# Patient Record
Sex: Female | Born: 1940
Health system: Southern US, Community
[De-identification: ages and names within clinical notes are randomized; demographics above are authoritative.]

## PROBLEM LIST (undated history)

## (undated) DIAGNOSIS — M479 Spondylosis, unspecified: Secondary | ICD-10-CM

## (undated) DIAGNOSIS — K219 Gastro-esophageal reflux disease without esophagitis: Secondary | ICD-10-CM

## (undated) DIAGNOSIS — I739 Peripheral vascular disease, unspecified: Secondary | ICD-10-CM

## (undated) DIAGNOSIS — K921 Melena: Secondary | ICD-10-CM

## (undated) DIAGNOSIS — Z972 Presence of dental prosthetic device (complete) (partial): Secondary | ICD-10-CM

## (undated) DIAGNOSIS — D649 Anemia, unspecified: Secondary | ICD-10-CM

## (undated) DIAGNOSIS — K5790 Diverticulosis of intestine, part unspecified, without perforation or abscess without bleeding: Secondary | ICD-10-CM

## (undated) DIAGNOSIS — F32A Depression, unspecified: Secondary | ICD-10-CM

## (undated) DIAGNOSIS — I10 Essential (primary) hypertension: Secondary | ICD-10-CM

## (undated) DIAGNOSIS — E039 Hypothyroidism, unspecified: Secondary | ICD-10-CM

## (undated) DIAGNOSIS — F419 Anxiety disorder, unspecified: Secondary | ICD-10-CM

## (undated) DIAGNOSIS — F329 Major depressive disorder, single episode, unspecified: Secondary | ICD-10-CM

## (undated) DIAGNOSIS — M1990 Unspecified osteoarthritis, unspecified site: Secondary | ICD-10-CM

## (undated) HISTORY — PX: ABDOMINAL HYSTERECTOMY: SHX81

## (undated) HISTORY — DX: Hypothyroidism, unspecified: E03.9

## (undated) HISTORY — DX: Depression, unspecified: F32.A

## (undated) HISTORY — DX: Essential (primary) hypertension: I10

## (undated) HISTORY — PX: FOOT SURGERY: SHX648

## (undated) HISTORY — PX: WRIST SURGERY: SHX841

## (undated) HISTORY — DX: Major depressive disorder, single episode, unspecified: F32.9

---

## 1993-12-03 HISTORY — PX: CARDIAC CATHETERIZATION: SHX172

## 2006-11-06 ENCOUNTER — Ambulatory Visit: Payer: Self-pay | Admitting: Family Medicine

## 2008-05-05 ENCOUNTER — Ambulatory Visit: Payer: Self-pay | Admitting: Internal Medicine

## 2008-06-08 ENCOUNTER — Ambulatory Visit: Payer: Self-pay | Admitting: Otolaryngology

## 2008-11-30 ENCOUNTER — Ambulatory Visit: Payer: Self-pay | Admitting: Internal Medicine

## 2008-12-29 ENCOUNTER — Ambulatory Visit: Payer: Self-pay | Admitting: Gastroenterology

## 2008-12-29 HISTORY — PX: COLONOSCOPY: SHX174

## 2009-06-23 ENCOUNTER — Ambulatory Visit: Payer: Self-pay | Admitting: Internal Medicine

## 2010-08-06 IMAGING — US US EXTREM LOW VENOUS*L*
1 series · 17 of 23 positions shown · non-contrast
Comparison: none

REASON FOR EXAM: pa[REDACTED]reased popliteal pulses left leg
COMMENTS:

[Series 1: us extrem low venous*left* · 17 of 23 slices shown]
[im 1/23]
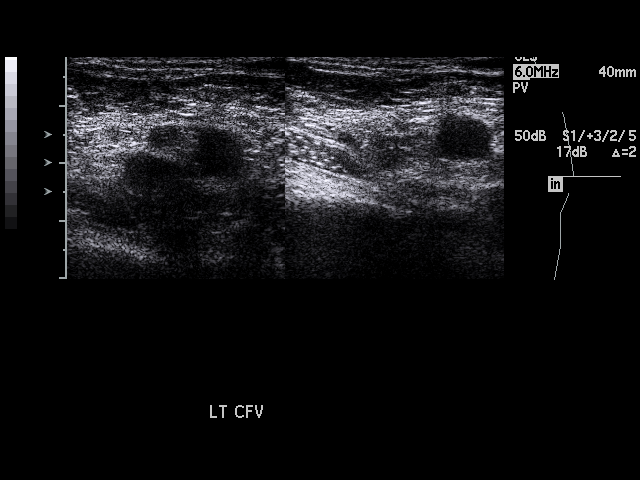
[im 3/23]
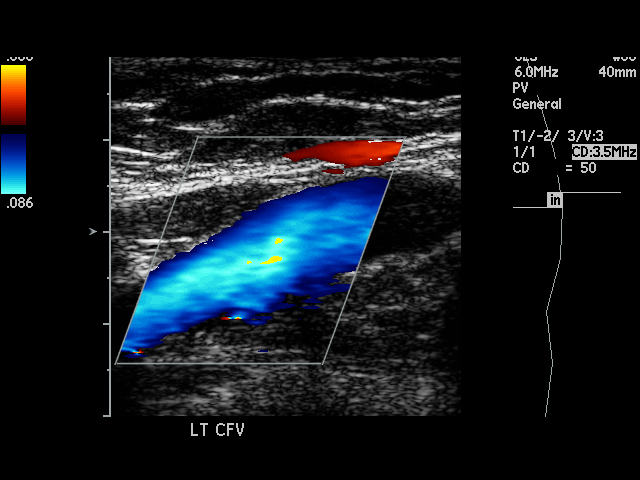
[im 4/23]
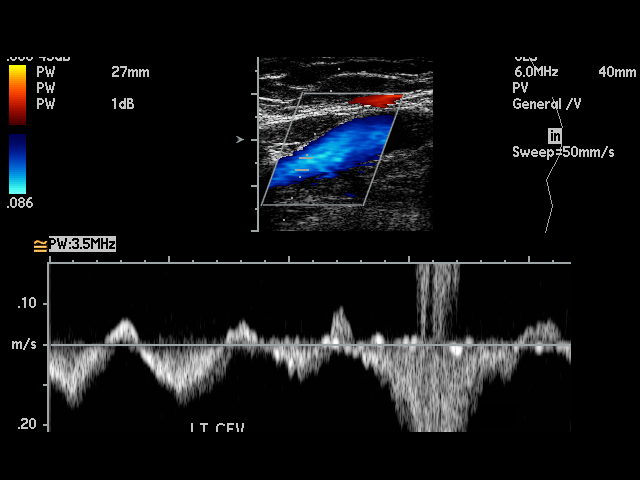
[im 5/23]
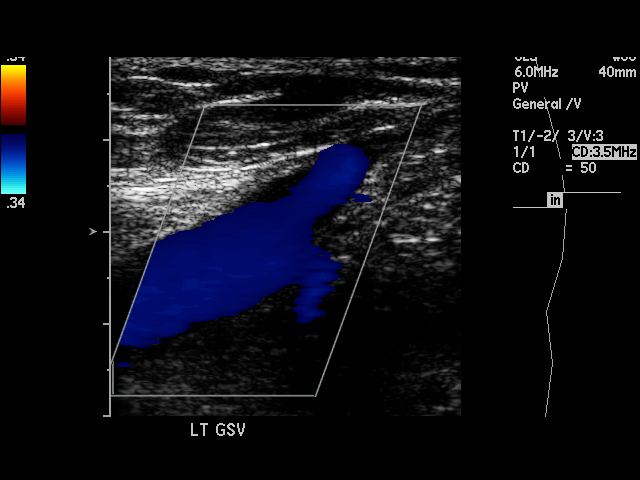
[im 7/23]
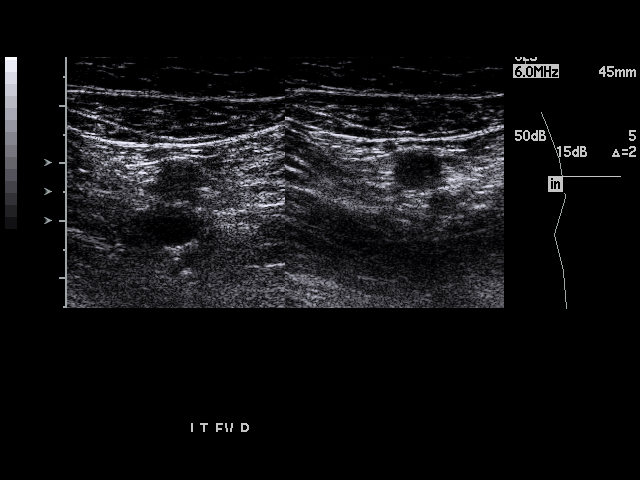
[im 8/23]
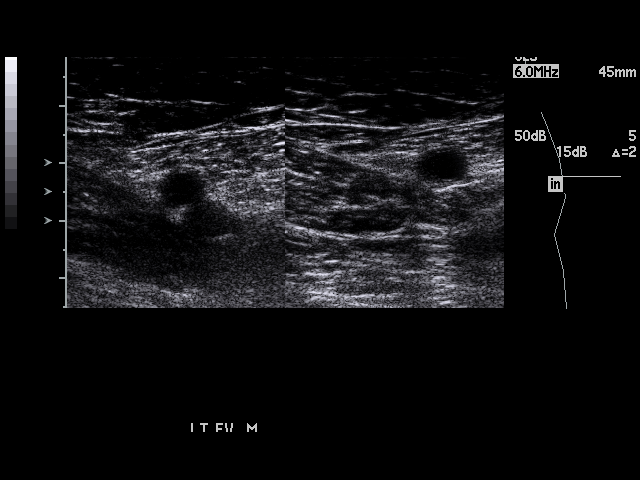
[im 9/23]
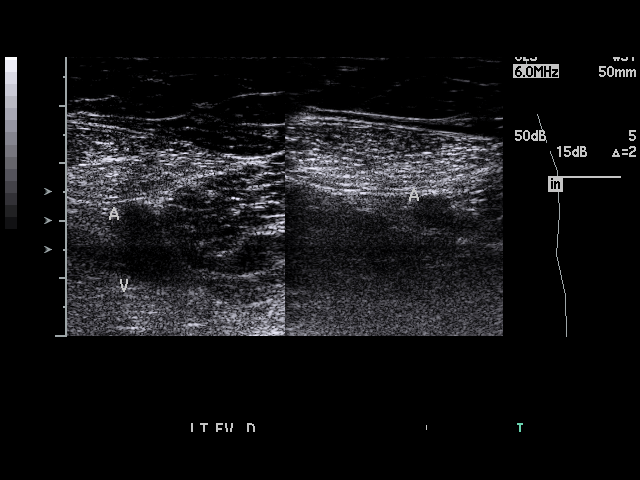
[im 11/23]
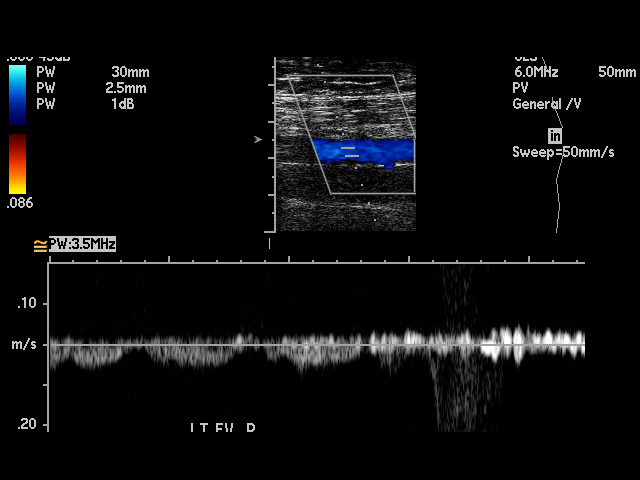
[im 12/23]
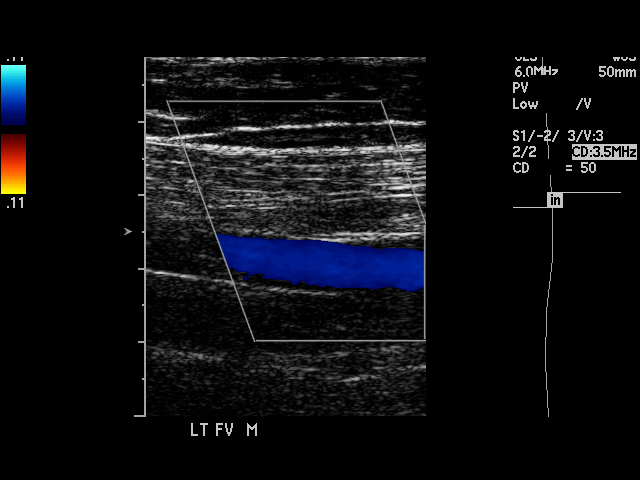
[im 13/23]
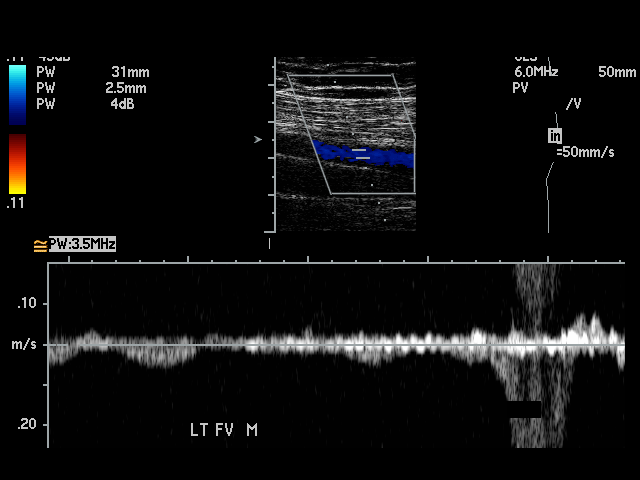
[im 15/23]
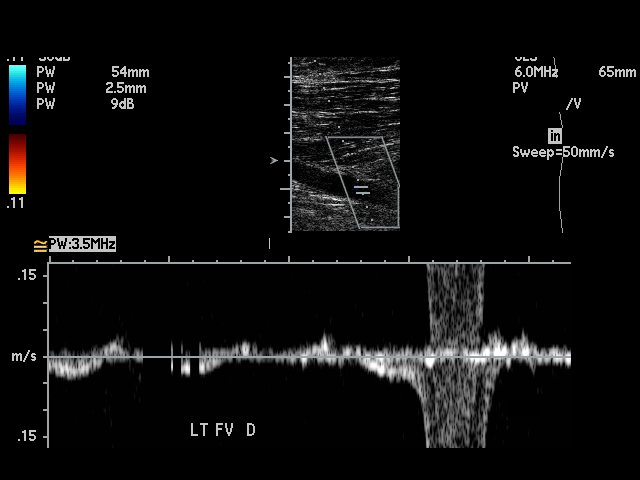
[im 16/23]
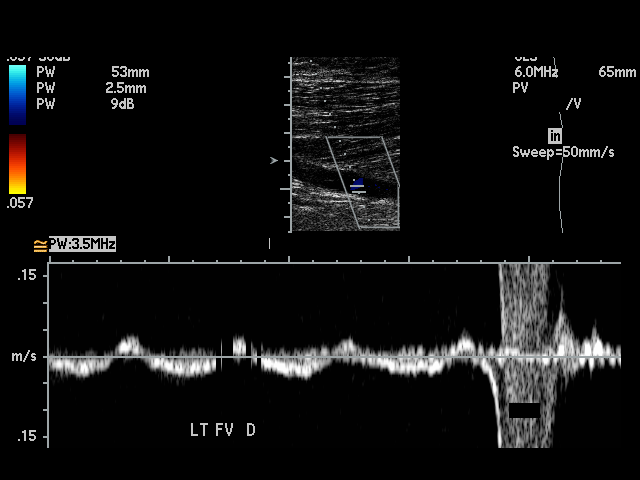
[im 17/23]
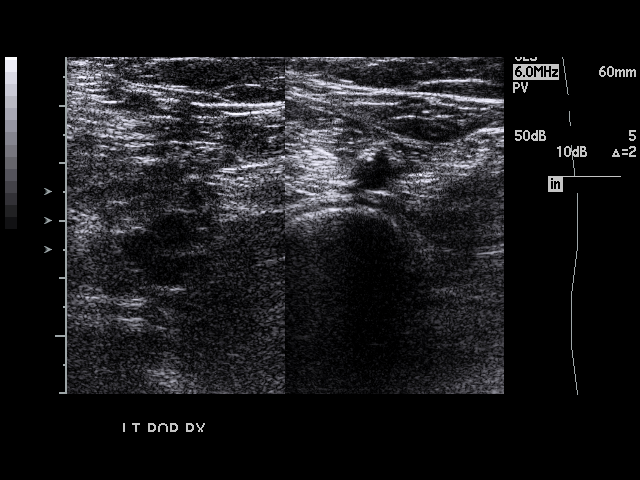
[im 19/23]
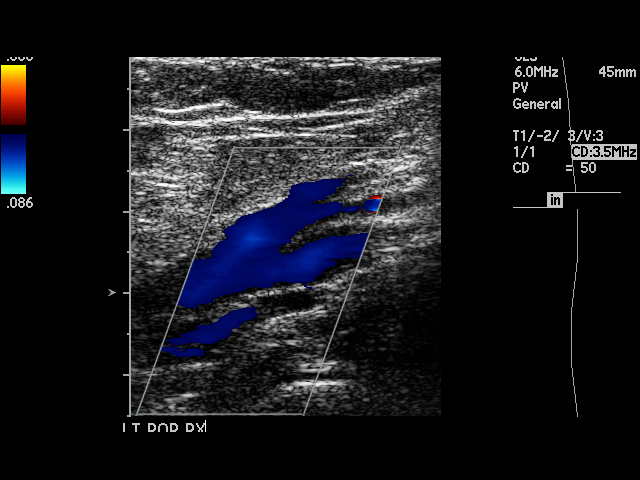
[im 20/23]
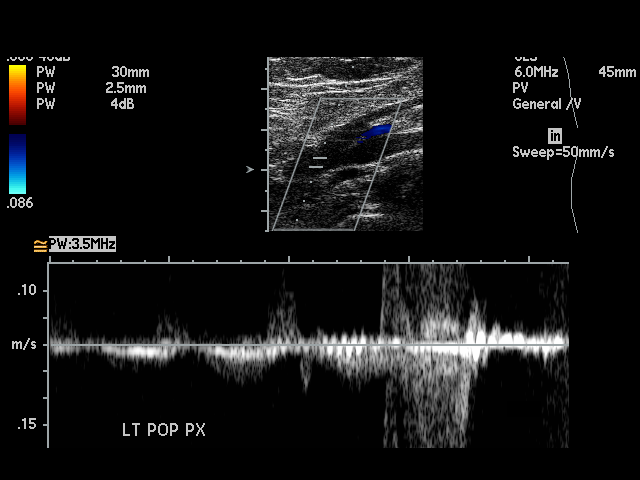
[im 21/23]
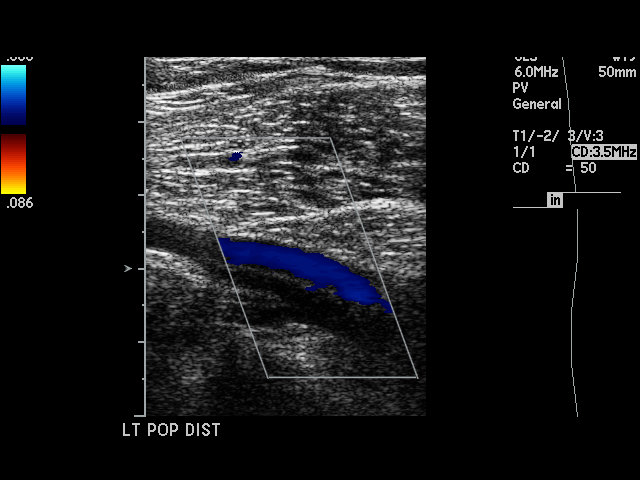
[im 23/23]
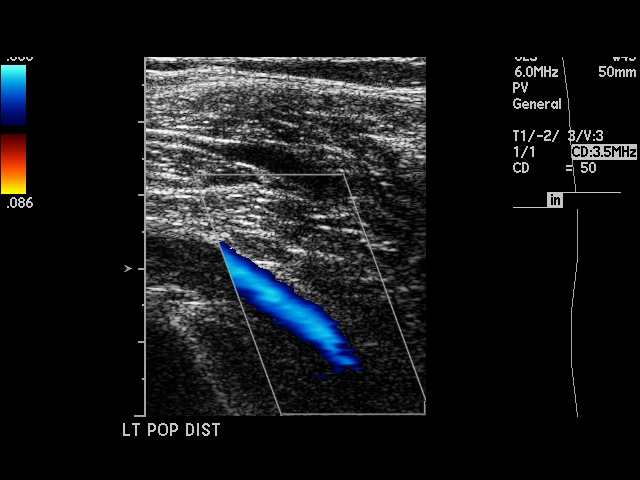

[17 of 23 positions shown; findings below may reference images not displayed]

PROCEDURE:     US  - US DOPPLER LOW EXTR LEFT  - November 30, 2008 [DATE]

RESULT:     The augmentation flow waveforms are normal. The LEFT femoral and
popliteal vein shows complete compressibility throughout its course. Doppler
examination shows no occlusion or evidence of the deep vein thrombosis.
IMPRESSION: No deep vein thrombosis is identified in the LEFT leg.

## 2010-11-13 LAB — HM MAMMOGRAPHY: HM Mammogram: NORMAL

## 2011-01-23 ENCOUNTER — Ambulatory Visit: Payer: Self-pay | Admitting: Internal Medicine

## 2011-02-02 ENCOUNTER — Ambulatory Visit: Payer: Self-pay | Admitting: General Surgery

## 2011-02-08 ENCOUNTER — Ambulatory Visit: Payer: Self-pay | Admitting: General Surgery

## 2011-02-13 ENCOUNTER — Ambulatory Visit: Payer: Self-pay | Admitting: General Surgery

## 2011-03-04 ENCOUNTER — Ambulatory Visit: Payer: Self-pay | Admitting: General Surgery

## 2011-04-03 ENCOUNTER — Ambulatory Visit: Payer: Self-pay | Admitting: General Surgery

## 2011-08-15 ENCOUNTER — Ambulatory Visit (INDEPENDENT_AMBULATORY_CARE_PROVIDER_SITE_OTHER): Payer: Medicare Other | Admitting: Internal Medicine

## 2011-08-15 ENCOUNTER — Encounter: Payer: Self-pay | Admitting: Internal Medicine

## 2011-08-15 VITALS — BP 145/86 | HR 60 | Temp 98.3°F | Resp 12 | Ht 65.0 in | Wt 164.5 lb

## 2011-08-15 DIAGNOSIS — E039 Hypothyroidism, unspecified: Secondary | ICD-10-CM | POA: Insufficient documentation

## 2011-08-15 DIAGNOSIS — F32A Depression, unspecified: Secondary | ICD-10-CM | POA: Insufficient documentation

## 2011-08-15 DIAGNOSIS — F329 Major depressive disorder, single episode, unspecified: Secondary | ICD-10-CM | POA: Insufficient documentation

## 2011-08-15 DIAGNOSIS — I10 Essential (primary) hypertension: Secondary | ICD-10-CM | POA: Insufficient documentation

## 2011-08-15 DIAGNOSIS — Z0000 Encounter for general adult medical examination without abnormal findings: Secondary | ICD-10-CM

## 2011-08-15 DIAGNOSIS — F3289 Other specified depressive episodes: Secondary | ICD-10-CM

## 2011-08-15 LAB — COMPREHENSIVE METABOLIC PANEL
Alkaline Phosphatase: 52 U/L (ref 39–117)
CO2: 28 mEq/L (ref 19–32)
Creatinine, Ser: 0.8 mg/dL (ref 0.4–1.2)
GFR: 97.24 mL/min (ref >60.00)
Glucose, Bld: 117 mg/dL — ABNORMAL HIGH (ref 70–99)
Sodium: 142 mEq/L (ref 135–145)
Total Bilirubin: 0.4 mg/dL (ref 0.3–1.2)

## 2011-08-15 LAB — CBC WITH DIFFERENTIAL/PLATELET
Basophils Relative: 0.6 % (ref 0.0–3.0)
Eosinophils Relative: 0.9 % (ref 0.0–5.0)
HCT: 43.4 % (ref 36.0–46.0)
Hemoglobin: 14.6 g/dL (ref 12.0–15.0)
Lymphs Abs: 2.2 10*3/uL (ref 0.7–4.0)
MCV: 90.3 fl (ref 78.0–100.0)
Monocytes Absolute: 0.5 10*3/uL (ref 0.1–1.0)
Monocytes Relative: 6.7 % (ref 3.0–12.0)
Neutro Abs: 4.6 10*3/uL (ref 1.4–7.7)
RBC: 4.81 Mil/uL (ref 3.87–5.11)
WBC: 7.4 10*3/uL (ref 4.5–10.5)

## 2011-08-15 LAB — LIPID PANEL
Cholesterol: 232 mg/dL — ABNORMAL HIGH (ref 0–200)
HDL: 84.6 mg/dL (ref >39.00)
Triglycerides: 117 mg/dL (ref 0.0–149.0)

## 2011-08-15 MED ORDER — ZOSTER VACCINE LIVE 19400 UNT/0.65ML ~~LOC~~ SOLR: 0.6500 mL | Freq: Once | SUBCUTANEOUS | Status: DC

## 2011-08-15 NOTE — Patient Instructions (Signed)
Labs today, return in 6 months. Call if blood pressure>140/90 at home.

## 2011-08-15 NOTE — Progress Notes (Signed)
Subjective:    Patient ID: Lindsay [name], female    DOB: [birth year], 70 y.o.   MRN: [id number]  HPI Ms. bleeding and is a 70 year old female who presents to followup hypothyroidism, hypertension, and depression. She reports that she has been doing well in the interim since her last visit. She denies any complaints today. She continues to care for her husband who has dementia. She receives resp[facility] relatives [facility]. She continues to work full-time.  Outpatient Encounter Prescriptions as of 08/15/2011  Medication Sig Dispense Refill  . bisoprolol-hydrochlorothiazide (ZIAC) 10-6.25 MG per tablet       . levothyroxine (SYNTHROID, LEVOTHROID) 50 MCG tablet       . meloxicam (MOBIC) 7.5 MG tablet       . PARoxetine (PAXIL) 20 MG tablet       . traZODone (DESYREL) 50 MG tablet       . zoster vaccine live, PF, (ZOSTAVAX) 40981 UNT/0.65ML injection Inject 19,400 Units into the skin once.  1 vial  0    Review of Systems  Constitutional: Negative for fever, chills, appetite change, fatigue and unexpected weight change.  HENT: Negative for ear pain, congestion, sore throat, trouble swallowing, neck pain, voice change and sinus pressure.   Eyes: Negative for visual disturbance.  Respiratory: Negative for cough, shortness of breath, wheezing and stridor.   Cardiovascular: Negative for chest pain, palpitations and leg swelling.  Gastrointestinal: Negative for nausea, vomiting, abdominal pain, diarrhea, constipation, blood in stool, abdominal distention and anal bleeding.  Genitourinary: Negative for dysuria and flank pain.  Musculoskeletal: Negative for myalgias, arthralgias and gait problem.  Skin: Negative for color change and rash.  Neurological: Negative for dizziness and headaches.  Hematological: Negative for adenopathy. Does not bruise/bleed easily.  Psychiatric/Behavioral: Positive for dysphoric mood (occasional associated with caring for  husband with dementia, controlled with paxil). Negative for suicidal ideas and sleep disturbance. The patient is not nervous/anxious.     BP 145/86  Pulse 60  Temp(Src) 98.3 F (36.8 C) (Oral)  Resp 12  Ht 5\' 5"  (1.651 m)  Wt 164 lb 8 oz (74.617 kg)  BMI 27.37 kg/m2  SpO2 97%     Objective:   Physical Exam  Constitutional: She is oriented to person, place, and time. She appears well-developed and well-nourished. No distress.  HENT:  Head: Normocephalic and atraumatic.  Right Ear: External ear normal.  Left Ear: External ear normal.  Nose: Nose normal.  Mouth/Throat: Oropharynx is clear and moist. No oropharyngeal exudate.  Eyes: Conjunctivae are normal. Pupils are equal, round, and reactive to light. Right eye exhibits no discharge. Left eye exhibits no discharge. No scleral icterus.  Neck: Normal range of motion. Neck supple. No tracheal deviation present. No thyromegaly present.  Cardiovascular: Normal rate, regular rhythm, normal heart sounds and intact distal pulses.  Exam reveals no gallop and no friction rub.   No murmur heard. Pulmonary/Chest: Effort normal and breath sounds normal. No respiratory distress. She has no wheezes. She has no rales. She exhibits no tenderness.  Abdominal: Soft. Bowel sounds are normal. She exhibits no distension and no mass. There is no tenderness. There is no rebound and no guarding.  Musculoskeletal: Normal range of motion. She exhibits no edema and no tenderness.  Lymphadenopathy:    She has no cervical adenopathy.  Neurological: She is alert and oriented to person, place, and time. No cranial nerve deficit. She exhibits normal muscle tone. Coordination normal.  Skin: Skin is warm and dry. No rash noted. She is not diaphoretic. No erythema. No pallor.  Psychiatric: She has a normal mood and affect. Her behavior is normal. Judgment and thought content normal.          Assessment & Plan:  1. Hypothyroidism - will check TSH with labs  today. Will continue levothyroxine. Patient will return to clinic in 6 months.  2. Hypertension - blood pressure is elevated today. Patient has not been checking blood pressure at home. Encouraged her to check blood pressure at home and call if blood pressure running consistently greater than 140/90. Will check renal function and electrolytes with labs today. Patient will followup in 6 months.  3. Depression - depression currently well controlled on Paxil. Patient is managing well caring for her husband with dementia. Encouraged her to continue to use family members and [facility] for [facility].  4. Health maintenance - patient is up-to-date on health maintenance. She will return for her physical exam in May of 2013. We discussed Zostavax vaccine today. She was given a prescription to receive this vaccine at her local pharmacy. She was also encouraged to get her flu shot this fall either here at our office or at her pharmacy.

## 2011-08-16 ENCOUNTER — Encounter: Payer: Self-pay | Admitting: Internal Medicine

## 2011-08-16 LAB — LDL CHOLESTEROL, DIRECT: Direct LDL: 140.2 mg/dL

## 2011-08-16 LAB — MICROALBUMIN / CREATININE URINE RATIO
Creatinine,U: 134.9 mg/dL
Microalb Creat Ratio: 0.3 mg/g (ref 0.0–30.0)

## 2011-09-10 ENCOUNTER — Ambulatory Visit: Payer: Self-pay | Admitting: General Surgery

## 2011-09-27 ENCOUNTER — Ambulatory Visit: Payer: Self-pay | Admitting: General Surgery

## 2011-10-04 ENCOUNTER — Ambulatory Visit: Payer: Self-pay | Admitting: General Surgery

## 2011-11-30 ENCOUNTER — Other Ambulatory Visit: Payer: Self-pay | Admitting: *Deleted

## 2011-11-30 MED ORDER — MELOXICAM 7.5 MG PO TABS: 7.5000 mg | ORAL_TABLET | Freq: Every day | ORAL | Status: DC

## 2011-11-30 MED ORDER — LEVOTHYROXINE SODIUM 50 MCG PO TABS: 50.0000 ug | ORAL_TABLET | Freq: Every day | ORAL | Status: DC

## 2011-11-30 MED ORDER — BISOPROLOL-HYDROCHLOROTHIAZIDE 10-6.25 MG PO TABS: 1.0000 | ORAL_TABLET | Freq: Every day | ORAL | Status: DC

## 2012-01-09 ENCOUNTER — Encounter: Payer: Self-pay | Admitting: Internal Medicine

## 2012-02-06 ENCOUNTER — Telehealth: Payer: Self-pay | Admitting: *Deleted

## 2012-02-06 MED ORDER — LOSARTAN POTASSIUM 50 MG PO TABS: 50.0000 mg | ORAL_TABLET | Freq: Every day | ORAL | Status: DC

## 2012-02-06 NOTE — Telephone Encounter (Signed)
Done

## 2012-02-06 NOTE — Telephone Encounter (Signed)
She was taking this per old office notes, so fine to add to med list and refill.

## 2012-02-06 NOTE — Telephone Encounter (Signed)
Pharm faxed RF request - Losartan potassium 50 mg 1 qd. This is not on med list, please advise.

## 2012-02-13 ENCOUNTER — Encounter: Payer: Self-pay | Admitting: Internal Medicine

## 2012-02-13 ENCOUNTER — Ambulatory Visit (INDEPENDENT_AMBULATORY_CARE_PROVIDER_SITE_OTHER): Payer: Medicare Other | Admitting: Internal Medicine

## 2012-02-13 VITALS — BP 122/70 | HR 60 | Temp 98.1°F | Wt 167.0 lb

## 2012-02-13 DIAGNOSIS — I10 Essential (primary) hypertension: Secondary | ICD-10-CM

## 2012-02-13 DIAGNOSIS — M1990 Unspecified osteoarthritis, unspecified site: Secondary | ICD-10-CM

## 2012-02-13 DIAGNOSIS — E039 Hypothyroidism, unspecified: Secondary | ICD-10-CM

## 2012-02-13 DIAGNOSIS — Z23 Encounter for immunization: Secondary | ICD-10-CM

## 2012-02-13 DIAGNOSIS — G4700 Insomnia, unspecified: Secondary | ICD-10-CM

## 2012-02-13 DIAGNOSIS — E785 Hyperlipidemia, unspecified: Secondary | ICD-10-CM

## 2012-02-13 DIAGNOSIS — M129 Arthropathy, unspecified: Secondary | ICD-10-CM

## 2012-02-13 DIAGNOSIS — F3289 Other specified depressive episodes: Secondary | ICD-10-CM

## 2012-02-13 DIAGNOSIS — F329 Major depressive disorder, single episode, unspecified: Secondary | ICD-10-CM

## 2012-02-13 DIAGNOSIS — F32A Depression, unspecified: Secondary | ICD-10-CM

## 2012-02-13 LAB — COMPREHENSIVE METABOLIC PANEL
ALT: 21 U/L (ref 0–35)
AST: 20 U/L (ref 0–37)
Albumin: 4 g/dL (ref 3.5–5.2)
Alkaline Phosphatase: 43 U/L (ref 39–117)
BUN: 17 mg/dL (ref 6–23)
Calcium: 9.2 mg/dL (ref 8.4–10.5)
Glucose, Bld: 105 mg/dL — ABNORMAL HIGH (ref 70–99)
Total Bilirubin: 0.3 mg/dL (ref 0.3–1.2)
Total Protein: 7 g/dL (ref 6.0–8.3)

## 2012-02-13 LAB — LIPID PANEL
HDL: 71.1 mg/dL (ref >39.00)
Total CHOL/HDL Ratio: 3

## 2012-02-13 LAB — TSH: TSH: 0.73 u[IU]/mL (ref 0.35–5.50)

## 2012-02-13 MED ORDER — BISOPROLOL-HYDROCHLOROTHIAZIDE 10-6.25 MG PO TABS: 1.0000 | ORAL_TABLET | Freq: Every day | ORAL | Status: DC

## 2012-02-13 MED ORDER — LEVOTHYROXINE SODIUM 50 MCG PO TABS: 50.0000 ug | ORAL_TABLET | Freq: Every day | ORAL | Status: DC

## 2012-02-13 MED ORDER — MELOXICAM 7.5 MG PO TABS: 7.5000 mg | ORAL_TABLET | Freq: Every day | ORAL | Status: DC

## 2012-02-13 MED ORDER — PAROXETINE HCL 20 MG PO TABS: 20.0000 mg | ORAL_TABLET | Freq: Every day | ORAL | Status: DC

## 2012-02-13 MED ORDER — TRAZODONE HCL 50 MG PO TABS: 50.0000 mg | ORAL_TABLET | Freq: Every day | ORAL | Status: DC

## 2012-02-13 MED ORDER — LOSARTAN POTASSIUM 50 MG PO TABS: 50.0000 mg | ORAL_TABLET | Freq: Every day | ORAL | Status: DC

## 2012-02-13 NOTE — Assessment & Plan Note (Signed)
Well controlled with Paxil. Will continue.

## 2012-02-13 NOTE — Progress Notes (Signed)
Addended by: Melody Comas L on: 02/13/2012 10:10 AM   Modules accepted: Orders

## 2012-02-13 NOTE — Assessment & Plan Note (Signed)
BP well controlled. Will continue current medications. Will check renal function with labs today.

## 2012-02-13 NOTE — Progress Notes (Signed)
Subjective:    Patient ID: Lindsay [name], female    DOB: 1941/06/14, 71 y.o.   MRN: [id number]  HPI 71YO female with h/o HTN, hypothyroidism, insomnia, depression presents for follow up. She reports she is doing well.  She notes full compliance with her medications, except for losartan which she ran out of a few days ago. She denies chest pain, palpitations, headache.  Insomnia well controlled generally with trazodone except on nights when her husband, who has dementia, keeps her up.  Her mood has been generally good. She has been active and is participating in exercise program at the [facility].  Outpatient Encounter Prescriptions as of 02/13/2012  Medication Sig Dispense Refill  . bisoprolol-hydrochlorothiazide (ZIAC) 10-6.25 MG per tablet Take 1 tablet by mouth daily.  90 tablet  0  . levothyroxine (SYNTHROID, LEVOTHROID) 50 MCG tablet Take 1 tablet (50 mcg total) by mouth daily.  90 tablet  0  . losartan (COZAAR) 50 MG tablet Take 1 tablet (50 mg total) by mouth daily.  90 tablet  1  . meloxicam (MOBIC) 7.5 MG tablet Take 1 tablet (7.5 mg total) by mouth daily.  90 tablet  1  . PARoxetine (PAXIL) 20 MG tablet Take 1 tablet (20 mg total) by mouth daily.  90 tablet  1  . traZODone (DESYREL) 50 MG tablet Take 1 tablet (50 mg total) by mouth at bedtime.  90 tablet  1    Review of Systems  Constitutional: Negative for fever, chills, appetite change, fatigue and unexpected weight change.  HENT: Negative for ear pain, congestion, sore throat, trouble swallowing, neck pain, voice change and sinus pressure.   Eyes: Negative for visual disturbance.  Respiratory: Negative for cough, shortness of breath, wheezing and stridor.   Cardiovascular: Negative for chest pain, palpitations and leg swelling.  Gastrointestinal: Negative for nausea, vomiting, abdominal pain, diarrhea, constipation, blood in stool, abdominal distention and anal bleeding.  Genitourinary: Negative for dysuria and flank pain.    Musculoskeletal: Negative for myalgias, arthralgias and gait problem.  Skin: Negative for color change and rash.  Neurological: Negative for dizziness and headaches.  Hematological: Negative for adenopathy. Does not bruise/bleed easily.  Psychiatric/Behavioral: Negative for suicidal ideas, sleep disturbance and dysphoric mood. The patient is not nervous/anxious.    BP 122/70  Pulse 60  Temp(Src) 98.1 F (36.7 C) (Oral)  Wt 167 lb (75.751 kg)  SpO2 97%     Objective:   Physical Exam  Constitutional: She is oriented to person, place, and time. She appears well-developed and well-nourished. No distress.  HENT:  Head: Normocephalic and atraumatic.  Right Ear: External ear normal.  Left Ear: External ear normal.  Nose: Nose normal.  Mouth/Throat: Oropharynx is clear and moist. No oropharyngeal exudate.  Eyes: Conjunctivae are normal. Pupils are equal, round, and reactive to light. Right eye exhibits no discharge. Left eye exhibits no discharge. No scleral icterus.  Neck: Normal range of motion. Neck supple. No tracheal deviation present. No thyromegaly present.  Cardiovascular: Normal rate, regular rhythm, normal heart sounds and intact distal pulses.  Exam reveals no gallop and no friction rub.   No murmur heard. Pulmonary/Chest: Effort normal and breath sounds normal. No respiratory distress. She has no wheezes. She has no rales. She exhibits no tenderness.  Abdominal: Soft. Bowel sounds are normal. She exhibits no distension and no mass. There is no tenderness. There is no rebound and no guarding.  Musculoskeletal: Normal range of motion. She exhibits no edema and no tenderness.  Lymphadenopathy:  She has no cervical adenopathy.  Neurological: She is alert and oriented to person, place, and time. No cranial nerve deficit. She exhibits normal muscle tone. Coordination normal.  Skin: Skin is warm and dry. No rash noted. She is not diaphoretic. No erythema. No pallor.  Psychiatric:  She has a normal mood and affect. Her behavior is normal. Judgment and thought content normal.          Assessment & Plan:

## 2012-02-13 NOTE — Assessment & Plan Note (Signed)
Symptoms well controlled with Trazodone. Will continue. 

## 2012-02-13 NOTE — Assessment & Plan Note (Signed)
Will check TSH with labs today. Continue Synthroid. 

## 2012-05-27 ENCOUNTER — Other Ambulatory Visit: Payer: Self-pay | Admitting: Internal Medicine

## 2012-08-29 ENCOUNTER — Other Ambulatory Visit: Payer: Self-pay | Admitting: Internal Medicine

## 2012-08-29 DIAGNOSIS — M1990 Unspecified osteoarthritis, unspecified site: Secondary | ICD-10-CM

## 2012-08-29 MED ORDER — MELOXICAM 7.5 MG PO TABS: 7.5000 mg | ORAL_TABLET | Freq: Every day | ORAL | Status: DC

## 2012-09-10 ENCOUNTER — Encounter: Payer: Self-pay | Admitting: Internal Medicine

## 2012-09-10 ENCOUNTER — Ambulatory Visit (INDEPENDENT_AMBULATORY_CARE_PROVIDER_SITE_OTHER): Payer: Medicare Other | Admitting: Internal Medicine

## 2012-09-10 VITALS — BP 122/70 | HR 61 | Temp 98.5°F | Ht 65.0 in | Wt 170.2 lb

## 2012-09-10 DIAGNOSIS — M129 Arthropathy, unspecified: Secondary | ICD-10-CM

## 2012-09-10 DIAGNOSIS — H6690 Otitis media, unspecified, unspecified ear: Secondary | ICD-10-CM

## 2012-09-10 DIAGNOSIS — E039 Hypothyroidism, unspecified: Secondary | ICD-10-CM

## 2012-09-10 DIAGNOSIS — Z0000 Encounter for general adult medical examination without abnormal findings: Secondary | ICD-10-CM

## 2012-09-10 DIAGNOSIS — Z23 Encounter for immunization: Secondary | ICD-10-CM

## 2012-09-10 DIAGNOSIS — F411 Generalized anxiety disorder: Secondary | ICD-10-CM

## 2012-09-10 DIAGNOSIS — Z1239 Encounter for other screening for malignant neoplasm of breast: Secondary | ICD-10-CM

## 2012-09-10 DIAGNOSIS — H6692 Otitis media, unspecified, left ear: Secondary | ICD-10-CM

## 2012-09-10 DIAGNOSIS — M1990 Unspecified osteoarthritis, unspecified site: Secondary | ICD-10-CM

## 2012-09-10 DIAGNOSIS — F419 Anxiety disorder, unspecified: Secondary | ICD-10-CM | POA: Insufficient documentation

## 2012-09-10 MED ORDER — ALPRAZOLAM 0.25 MG PO TABS: 0.2500 mg | ORAL_TABLET | Freq: Three times a day (TID) | ORAL | Status: DC | PRN

## 2012-09-10 MED ORDER — MELOXICAM 7.5 MG PO TABS: 7.5000 mg | ORAL_TABLET | Freq: Every day | ORAL | Status: DC

## 2012-09-10 MED ORDER — AZITHROMYCIN 250 MG PO TABS: ORAL_TABLET | ORAL | Status: DC

## 2012-09-10 NOTE — Assessment & Plan Note (Signed)
Patient notes increased anxiety related to work and caring for her husband who has dementia. Will continue Paxil and start Xanax as needed for episodes of severe anxiety. Patient will be cautious using medication because of risk of sedation. She will followup in 6 months or sooner as needed.

## 2012-09-10 NOTE — Assessment & Plan Note (Signed)
Symptoms of arthritis pain currently well-controlled with meloxicam. We'll continue.

## 2012-09-10 NOTE — Progress Notes (Signed)
Subjective:    Patient ID: Lindsay [name], female    DOB: [birth year], 71 y.o.   MRN: [id number]  HPI The patient is here for annual Medicare wellness examination and management of other chronic and acute problems.   The risk factors are reflected in the social history.  The roster of all physicians providing medical care to patient - is listed in the Snapshot section of the chart.  Activities of daily living:  The patient is 100% independent in all ADLs: dressing, toileting, feeding as well as independent mobility  Home safety : The patient has smoke detectors in the home. They wear seatbelts.  There are no firearms at home. There is no violence in the home.   There is no risks for hepatitis, STDs or HIV. There is no  history of blood transfusion. They have no travel history to infectious disease endemic areas of the world.  The patient has seen their dentist in the last six month. ([name]) They have seen their eye doctor in the last year. ([facility]) No difficulty with hearing.  They have deferred audiologic testing in the last year.  They do not  have excessive sun exposure. Discussed the need for sun protection: hats, long sleeves and use of sunscreen if there is significant sun exposure.   Diet: the importance of a healthy diet is discussed. They do have a healthy diet.  The benefits of regular aerobic exercise were discussed. No current exercise. Discussed simple exercises for home.  Depression screen: there are no signs or vegative symptoms of depression- irritability, change in appetite, anhedonia, sadness/tearfullness. Some recent increased anxiety noted today.  Cognitive assessment: the patient manages all their financial and personal affairs and is actively engaged. They could relate day,date,year and events.  The following portions of the patient's history were reviewed and updated as appropriate: allergies, current medications, past family history, past medical history,   past surgical history, past social history  and problem list.  Visual acuity was not assessed per patient preference since she has regular follow up with her ophthalmologist. Hearing and body mass index were assessed and reviewed.   During the course of the visit the patient was educated and counseled about appropriate screening and preventive services including : fall prevention , diabetes screening, nutrition counseling, colorectal cancer screening, and recommended immunizations.    Patient also concerned today about slight increase in nasal congestion over the last couple of weeks. Denies any fever, cough, shortness of breath. Not currently taking any medication for this. Outpatient Encounter Prescriptions as of 09/10/2012  Medication Sig Dispense Refill  . bisoprolol-hydrochlorothiazide (ZIAC) 10-6.25 MG per tablet TAKE ONE (1) TABLET BY MOUTH EVERY      DAY  90 tablet  3  . levothyroxine (SYNTHROID, LEVOTHROID) 50 MCG tablet TAKE ONE (1) TABLET BY MOUTH EVERY      DAY  90 tablet  3  . losartan (COZAAR) 50 MG tablet Take 1 tablet (50 mg total) by mouth daily.  90 tablet  1  . meloxicam (MOBIC) 7.5 MG tablet Take 1 tablet (7.5 mg total) by mouth daily.  90 tablet  1  . PARoxetine (PAXIL) 20 MG tablet Take 1 tablet (20 mg total) by mouth daily.  90 tablet  1  . traZODone (DESYREL) 50 MG tablet Take 1 tablet (50 mg total) by mouth at bedtime.  90 tablet  1  . DISCONTD: meloxicam (MOBIC) 7.5 MG tablet Take 1 tablet (7.5 mg total) by mouth daily.  90 tablet  1  .  ALPRAZolam (XANAX) 0.25 MG tablet Take 1 tablet (0.25 mg total) by mouth 3 (three) times daily as needed for anxiety.  90 tablet  2  . azithromycin (ZITHROMAX Z-PAK) 250 MG tablet Take 2 pills day 1, then 1 pill daily days 2-5  6 each  0   BP 122/70  Pulse 61  Temp 98.5 F (36.9 C) (Oral)  Ht 5\' 5"  (1.651 m)  Wt 170 lb 4 oz (77.225 kg)  BMI 28.33 kg/m2  SpO2 98%  Review of Systems  Constitutional: Negative for fever, chills,  appetite change, fatigue and unexpected weight change.  HENT: Positive for congestion and rhinorrhea. Negative for ear pain, sore throat, trouble swallowing, neck pain, voice change and sinus pressure.   Eyes: Negative for visual disturbance.  Respiratory: Negative for cough, shortness of breath, wheezing and stridor.   Cardiovascular: Negative for chest pain, palpitations and leg swelling.  Gastrointestinal: Negative for nausea, vomiting, abdominal pain, diarrhea, constipation, blood in stool, abdominal distention and anal bleeding.  Genitourinary: Negative for dysuria and flank pain.  Musculoskeletal: Positive for arthralgias. Negative for myalgias and gait problem.  Skin: Negative for color change and rash.  Neurological: Negative for dizziness and headaches.  Hematological: Negative for adenopathy. Does not bruise/bleed easily.  Psychiatric/Behavioral: Positive for disturbed wake/sleep cycle and dysphoric mood. Negative for suicidal ideas. The patient is nervous/anxious.        Objective:   Physical Exam  Constitutional: She is oriented to person, place, and time. She appears well-developed and well-nourished. No distress.  HENT:  Head: Normocephalic and atraumatic.  Right Ear: External ear normal. Tympanic membrane is not erythematous and not bulging. A middle ear effusion is present.  Left Ear: External ear normal. Tympanic membrane is erythematous and bulging. A middle ear effusion is present.  Nose: Nose normal.  Mouth/Throat: Oropharynx is clear and moist. No oropharyngeal exudate.  Eyes: Conjunctivae normal are normal. Pupils are equal, round, and reactive to light. Right eye exhibits no discharge. Left eye exhibits no discharge. No scleral icterus.  Neck: Normal range of motion. Neck supple. No tracheal deviation present. No thyromegaly present.  Cardiovascular: Normal rate, regular rhythm, normal heart sounds and intact distal pulses.  Exam reveals no gallop and no friction rub.    No murmur heard. Pulmonary/Chest: Effort normal and breath sounds normal. No accessory muscle usage. Not tachypneic. No respiratory distress. She has no decreased breath sounds. She has no wheezes. She has no rhonchi. She has no rales. She exhibits no tenderness.  Abdominal: Soft. Bowel sounds are normal. She exhibits no distension and no mass. There is no tenderness. There is no rebound and no guarding.  Musculoskeletal: Normal range of motion. She exhibits no edema and no tenderness.  Lymphadenopathy:    She has no cervical adenopathy.  Neurological: She is alert and oriented to person, place, and time. No cranial nerve deficit. She exhibits normal muscle tone. Coordination normal.  Skin: Skin is warm and dry. No rash noted. She is not diaphoretic. No erythema. No pallor.  Psychiatric: She has a normal mood and affect. Her behavior is normal. Judgment and thought content normal.          Assessment & Plan:

## 2012-09-10 NOTE — Assessment & Plan Note (Signed)
General exam including breast exam normal today. Pap deferred because of age and history of all normal Paps. Appropriate screening performed. Health maintenance is up to date except for mammogram which was scheduled. Flu vaccine was given. Labs today including CBC, CMP, lipid profile, TSH. Followup 6 months or sooner as needed.

## 2012-09-10 NOTE — Assessment & Plan Note (Signed)
Patient notes a couple week history of increased nasal congestion and exam is remarkable for a left otitis media. Will treat with azithromycin. Patient will call or return to clinic if symptoms are not improving.

## 2012-09-12 ENCOUNTER — Other Ambulatory Visit: Payer: Self-pay | Admitting: Internal Medicine

## 2012-09-15 LAB — CBC WITH DIFFERENTIAL
Basos: 1 % (ref 0–3)
Eosinophils Absolute: 0.2 10*3/uL (ref 0.0–0.4)
HCT: 43.3 % (ref 34.0–46.6)
Hemoglobin: 14.8 g/dL (ref 11.1–15.9)
Lymphocytes Absolute: 3.1 10*3/uL (ref 0.7–3.1)
MCH: 30.4 pg (ref 26.6–33.0)
MCHC: 34.2 g/dL (ref 31.5–35.7)
MCV: 89 fL (ref 79–97)
Monocytes Absolute: 0.6 10*3/uL (ref 0.1–0.9)
Neutrophils Absolute: 3.2 10*3/uL (ref 1.4–7.0)

## 2012-09-15 LAB — COMPREHENSIVE METABOLIC PANEL
ALT: 19 IU/L (ref 0–32)
AST: 21 IU/L (ref 0–40)
Albumin/Globulin Ratio: 2 (ref 1.1–2.5)
GFR calc Af Amer: 90 mL/min/{1.73_m2} (ref >59)
GFR calc non Af Amer: 78 mL/min/{1.73_m2} (ref >59)
Potassium: 3.8 mmol/L (ref 3.5–5.2)
Sodium: 141 mmol/L (ref 134–144)
Total Bilirubin: 0.2 mg/dL (ref 0.0–1.2)

## 2012-09-15 LAB — LIPID PANEL W/O CHOL/HDL RATIO
Cholesterol, Total: 207 mg/dL — ABNORMAL HIGH (ref 100–199)
LDL Calculated: 122 mg/dL — ABNORMAL HIGH (ref 0–99)

## 2012-09-15 LAB — B12 AND FOLATE PANEL: Vitamin B-12: >1999 pg/mL — ABNORMAL HIGH (ref 211–946)

## 2012-09-15 LAB — TSH: TSH: 0.814 u[IU]/mL (ref 0.450–4.500)

## 2012-09-22 ENCOUNTER — Encounter: Payer: Self-pay | Admitting: Internal Medicine

## 2012-10-02 ENCOUNTER — Ambulatory Visit: Payer: Self-pay | Admitting: General Surgery

## 2012-10-09 ENCOUNTER — Ambulatory Visit: Payer: Self-pay | Admitting: General Surgery

## 2012-10-16 ENCOUNTER — Telehealth: Payer: Self-pay | Admitting: Internal Medicine

## 2012-10-16 NOTE — Telephone Encounter (Signed)
Will need to be seen if recurrent sinus infection.

## 2012-10-16 NOTE — Telephone Encounter (Signed)
Wanting lab work mailed to her home. Needing another medication for he sinuses. She has taken the Z-pak and her nose is still running .

## 2012-10-16 NOTE — Telephone Encounter (Signed)
Patient advised as instructed via telephone, appt scheduled with Dr. Darrick Huntsman on Monday at 9:15, patient stated that she will pick up a copy of lab work then.

## 2012-10-20 ENCOUNTER — Ambulatory Visit: Payer: Medicare Other | Admitting: Internal Medicine

## 2013-03-06 ENCOUNTER — Telehealth: Payer: Self-pay | Admitting: *Deleted

## 2013-03-06 DIAGNOSIS — I10 Essential (primary) hypertension: Secondary | ICD-10-CM

## 2013-03-06 MED ORDER — LOSARTAN POTASSIUM 50 MG PO TABS: 50.0000 mg | ORAL_TABLET | Freq: Every day | ORAL | Status: DC

## 2013-03-06 NOTE — Telephone Encounter (Signed)
Prescription sent to pharmacy.

## 2013-03-06 NOTE — Telephone Encounter (Signed)
Refill request  Cozaar 50 mg tab  #90  Take 1 tablet by mouth daily

## 2013-03-18 ENCOUNTER — Ambulatory Visit (INDEPENDENT_AMBULATORY_CARE_PROVIDER_SITE_OTHER): Payer: Medicare Other | Admitting: Internal Medicine

## 2013-03-18 ENCOUNTER — Encounter: Payer: Self-pay | Admitting: Internal Medicine

## 2013-03-18 VITALS — BP 128/80 | HR 68 | Temp 97.8°F | Wt 167.0 lb

## 2013-03-18 DIAGNOSIS — I10 Essential (primary) hypertension: Secondary | ICD-10-CM

## 2013-03-18 DIAGNOSIS — E039 Hypothyroidism, unspecified: Secondary | ICD-10-CM

## 2013-03-18 DIAGNOSIS — F419 Anxiety disorder, unspecified: Secondary | ICD-10-CM

## 2013-03-18 DIAGNOSIS — F17200 Nicotine dependence, unspecified, uncomplicated: Secondary | ICD-10-CM

## 2013-03-18 DIAGNOSIS — F411 Generalized anxiety disorder: Secondary | ICD-10-CM

## 2013-03-18 LAB — MICROALBUMIN / CREATININE URINE RATIO
Creatinine,U: 18.3 mg/dL
Microalb, Ur: 0.6 mg/dL (ref 0.0–1.9)

## 2013-03-18 NOTE — Assessment & Plan Note (Signed)
BP Readings from Last 3 Encounters:  03/18/13 128/80  09/10/12 122/70  02/13/12 122/70   Blood pressure well-controlled on current medications. Will continue. Will check renal function and urine microalbumin with labs today.

## 2013-03-18 NOTE — Progress Notes (Signed)
Subjective:    Patient ID: Lindsay [name], female    DOB: 03-10-1941, 72 y.o.   MRN: [id number]  HPI  72 year old female with history of hypertension, anxiety/depression, insomnia, hypothyroidism presents for followup. She reports she is generally doing well. She continues to struggle with working and caring for her husband who has Alzheimer's disease. She received some respite care through a local organization. She reports she is generally feeling well. She has no new concerns today. She continues to smoke about one pack of cigarettes every 2-3 days. She is trying to cut back on smoking.  Outpatient Encounter Prescriptions as of 03/18/2013  Medication Sig Dispense Refill  . ALPRAZolam (XANAX) 0.25 MG tablet Take 1 tablet (0.25 mg total) by mouth 3 (three) times daily as needed for anxiety.  90 tablet  2  . bisoprolol-hydrochlorothiazide (ZIAC) 10-6.25 MG per tablet TAKE ONE (1) TABLET BY MOUTH EVERY      DAY  90 tablet  3  . levothyroxine (SYNTHROID, LEVOTHROID) 50 MCG tablet TAKE ONE (1) TABLET BY MOUTH EVERY      DAY  90 tablet  3  . losartan (COZAAR) 50 MG tablet Take 1 tablet (50 mg total) by mouth daily.  90 tablet  0  . meloxicam (MOBIC) 7.5 MG tablet Take 1 tablet (7.5 mg total) by mouth daily.  90 tablet  1  . PARoxetine (PAXIL) 20 MG tablet Take 1 tablet (20 mg total) by mouth daily.  90 tablet  1  . traZODone (DESYREL) 50 MG tablet Take 1 tablet (50 mg total) by mouth at bedtime.  90 tablet  1  . [DISCONTINUED] azithromycin (ZITHROMAX Z-PAK) 250 MG tablet Take 2 pills day 1, then 1 pill daily days 2-5  6 each  0   No facility-administered encounter medications on file as of 03/18/2013.   BP 128/80  Pulse 68  Temp(Src) 97.8 F (36.6 C) (Oral)  Wt 167 lb (75.751 kg)  BMI 27.79 kg/m2  SpO2 99%  Review of Systems  Constitutional: Negative for fever, chills, appetite change, fatigue and unexpected weight change.  HENT: Negative for ear pain, congestion, sore throat, trouble  swallowing, neck pain, voice change and sinus pressure.   Eyes: Negative for visual disturbance.  Respiratory: Negative for cough, shortness of breath, wheezing and stridor.   Cardiovascular: Negative for chest pain, palpitations and leg swelling.  Gastrointestinal: Negative for nausea, vomiting, abdominal pain, diarrhea, constipation, blood in stool, abdominal distention and anal bleeding.  Genitourinary: Negative for dysuria and flank pain.  Musculoskeletal: Negative for myalgias, arthralgias and gait problem.  Skin: Negative for color change and rash.  Neurological: Negative for dizziness and headaches.  Hematological: Negative for adenopathy. Does not bruise/bleed easily.  Psychiatric/Behavioral: Negative for suicidal ideas, sleep disturbance and dysphoric mood. The patient is not nervous/anxious.        Objective:   Physical Exam  Constitutional: She is oriented to person, place, and time. She appears well-developed and well-nourished. No distress.  HENT:  Head: Normocephalic and atraumatic.  Right Ear: External ear normal.  Left Ear: External ear normal.  Nose: Nose normal.  Mouth/Throat: Oropharynx is clear and moist. No oropharyngeal exudate.  Eyes: Conjunctivae are normal. Pupils are equal, round, and reactive to light. Right eye exhibits no discharge. Left eye exhibits no discharge. No scleral icterus.  Neck: Normal range of motion. Neck supple. No tracheal deviation present. No thyromegaly present.  Cardiovascular: Normal rate, regular rhythm, normal heart sounds and intact distal pulses.  Exam reveals no gallop  and no friction rub.   No murmur heard. Pulmonary/Chest: Effort normal and breath sounds normal. No accessory muscle usage. Not tachypneic. No respiratory distress. She has no decreased breath sounds. She has no wheezes. She has no rhonchi. She has no rales. She exhibits no tenderness.  Musculoskeletal: Normal range of motion. She exhibits no edema and no tenderness.   Lymphadenopathy:    She has no cervical adenopathy.  Neurological: She is alert and oriented to person, place, and time. No cranial nerve deficit. She exhibits normal muscle tone. Coordination normal.  Skin: Skin is warm and dry. No rash noted. She is not diaphoretic. No erythema. No pallor.  Psychiatric: She has a normal mood and affect. Her behavior is normal. Judgment and thought content normal.          Assessment & Plan:

## 2013-03-18 NOTE — Assessment & Plan Note (Signed)
Symptoms well controlled with paxil and alprazolam. We'll continue.

## 2013-03-18 NOTE — Assessment & Plan Note (Signed)
Encouraged smoking cessation. Discussed new guidelines regarding CT chest screening for lung cancer. Given cost of this test, patient would like to defer for now.

## 2013-03-19 LAB — COMPREHENSIVE METABOLIC PANEL
AST: 23 U/L (ref 0–37)
Alkaline Phosphatase: 48 U/L (ref 39–117)
BUN: 15 mg/dL (ref 6–23)
Glucose, Bld: 73 mg/dL (ref 70–99)
Total Bilirubin: 0.7 mg/dL (ref 0.3–1.2)

## 2013-05-27 ENCOUNTER — Ambulatory Visit (INDEPENDENT_AMBULATORY_CARE_PROVIDER_SITE_OTHER): Payer: Medicare Other | Admitting: Adult Health

## 2013-05-27 ENCOUNTER — Encounter: Payer: Self-pay | Admitting: Adult Health

## 2013-05-27 VITALS — BP 124/68 | HR 69 | Temp 98.2°F | Resp 12 | Wt 166.5 lb

## 2013-05-27 DIAGNOSIS — B029 Zoster without complications: Secondary | ICD-10-CM | POA: Insufficient documentation

## 2013-05-27 MED ORDER — HYDROCODONE-ACETAMINOPHEN 5-325 MG PO TABS: 1.0000 | ORAL_TABLET | Freq: Four times a day (QID) | ORAL | Status: DC | PRN

## 2013-05-27 MED ORDER — VALACYCLOVIR HCL 1 G PO TABS: 1000.0000 mg | ORAL_TABLET | Freq: Three times a day (TID) | ORAL | Status: DC

## 2013-05-27 NOTE — Assessment & Plan Note (Addendum)
Erythema and a maculopapular rash on right buttocks. Clusters of clear vesicles. There is no pustulation, ulceration or crusting. Start Valtrex x 7 days. Apply calamine lotion to area. Do not apply alcohol. Hydrocodone for pain.

## 2013-05-27 NOTE — Progress Notes (Signed)
  Subjective:    Patient ID: Lindsay [name], female    DOB: [birth year], 72 y.o.   MRN: [id number]  HPI  72 year old female who presents to clinic with concerns that she may have shingles. Patient noted an area on her left buttocks that began to burn. It was painful. Then she had a series of blisters that developed. Patient applied alcohol to the area and experienced significant pain. She was trying to "get rid of it". She has not tried anything else. No headache or other bothersome symptoms.  Current Outpatient Prescriptions on File Prior to Visit  Medication Sig Dispense Refill  . ALPRAZolam (XANAX) 0.25 MG tablet Take 1 tablet (0.25 mg total) by mouth 3 (three) times daily as needed for anxiety.  90 tablet  2  . bisoprolol-hydrochlorothiazide (ZIAC) 10-6.25 MG per tablet TAKE ONE (1) TABLET BY MOUTH EVERY      DAY  90 tablet  3  . levothyroxine (SYNTHROID, LEVOTHROID) 50 MCG tablet TAKE ONE (1) TABLET BY MOUTH EVERY      DAY  90 tablet  3  . losartan (COZAAR) 50 MG tablet Take 1 tablet (50 mg total) by mouth daily.  90 tablet  0  . meloxicam (MOBIC) 7.5 MG tablet Take 1 tablet (7.5 mg total) by mouth daily.  90 tablet  1  . PARoxetine (PAXIL) 20 MG tablet Take 1 tablet (20 mg total) by mouth daily.  90 tablet  1  . traZODone (DESYREL) 50 MG tablet Take 1 tablet (50 mg total) by mouth at bedtime.  90 tablet  1   No current facility-administered medications on file prior to visit.     Review of Systems  Constitutional: Negative for fever and chills.  Skin: Positive for rash.       Itching rash with blisters       Objective:   Physical Exam  Constitutional: She is oriented to person, place, and time. She appears well-developed and well-nourished. No distress.  Cardiovascular: Normal rate and regular rhythm.   Pulmonary/Chest: Effort normal. No respiratory distress.  Neurological: She is alert and oriented to person, place, and time.  Skin: Skin is warm and dry. Rash noted. There is  erythema.  Erythema and a maculopapular rash on right buttocks. Clusters of clear vesicles. There is no pustulation, ulceration or crusting.  Psychiatric: She has a normal mood and affect. Her behavior is normal. Judgment and thought content normal.          Assessment & Plan:

## 2013-05-27 NOTE — Patient Instructions (Addendum)
  You have shingles.  Start Valtrex 1000 mg every 8 hours for 7 days until done.  I am giving you a prescription of hydrocodone for pain. You can take this every 6 hours as needed for pain.  Apply Calamine lotion to the area.

## 2013-06-19 ENCOUNTER — Other Ambulatory Visit: Payer: Self-pay | Admitting: *Deleted

## 2013-06-19 DIAGNOSIS — I10 Essential (primary) hypertension: Secondary | ICD-10-CM

## 2013-06-19 MED ORDER — BISOPROLOL-HYDROCHLOROTHIAZIDE 10-6.25 MG PO TABS: ORAL_TABLET | ORAL | Status: DC

## 2013-06-19 MED ORDER — LEVOTHYROXINE SODIUM 50 MCG PO TABS: ORAL_TABLET | ORAL | Status: DC

## 2013-06-19 MED ORDER — LOSARTAN POTASSIUM 50 MG PO TABS: 50.0000 mg | ORAL_TABLET | Freq: Every day | ORAL | Status: DC

## 2013-06-19 NOTE — Telephone Encounter (Signed)
Eprescribed.

## 2013-08-20 ENCOUNTER — Encounter: Payer: Self-pay | Admitting: *Deleted

## 2013-08-21 ENCOUNTER — Telehealth: Payer: Self-pay | Admitting: Internal Medicine

## 2013-08-21 ENCOUNTER — Encounter: Payer: Self-pay | Admitting: Internal Medicine

## 2013-08-21 ENCOUNTER — Ambulatory Visit (INDEPENDENT_AMBULATORY_CARE_PROVIDER_SITE_OTHER): Payer: Medicare Other | Admitting: Internal Medicine

## 2013-08-21 VITALS — BP 140/96 | HR 61 | Temp 98.0°F | Wt 169.0 lb

## 2013-08-21 DIAGNOSIS — E039 Hypothyroidism, unspecified: Secondary | ICD-10-CM

## 2013-08-21 DIAGNOSIS — E785 Hyperlipidemia, unspecified: Secondary | ICD-10-CM

## 2013-08-21 DIAGNOSIS — G4700 Insomnia, unspecified: Secondary | ICD-10-CM

## 2013-08-21 DIAGNOSIS — I10 Essential (primary) hypertension: Secondary | ICD-10-CM

## 2013-08-21 DIAGNOSIS — F32A Depression, unspecified: Secondary | ICD-10-CM

## 2013-08-21 DIAGNOSIS — F3289 Other specified depressive episodes: Secondary | ICD-10-CM

## 2013-08-21 DIAGNOSIS — F329 Major depressive disorder, single episode, unspecified: Secondary | ICD-10-CM

## 2013-08-21 LAB — TSH: TSH: 0.56 u[IU]/mL (ref 0.35–5.50)

## 2013-08-21 LAB — COMPREHENSIVE METABOLIC PANEL
Albumin: 3.9 g/dL (ref 3.5–5.2)
Alkaline Phosphatase: 47 U/L (ref 39–117)
BUN: 17 mg/dL (ref 6–23)
CO2: 28 mEq/L (ref 19–32)
GFR: 84.96 mL/min (ref >60.00)
Glucose, Bld: 81 mg/dL (ref 70–99)
Potassium: 4.3 mEq/L (ref 3.5–5.1)
Sodium: 140 mEq/L (ref 135–145)
Total Bilirubin: 0.4 mg/dL (ref 0.3–1.2)
Total Protein: 6.9 g/dL (ref 6.0–8.3)

## 2013-08-21 LAB — LIPID PANEL
Total CHOL/HDL Ratio: 3
VLDL: 42.8 mg/dL — ABNORMAL HIGH (ref 0.0–40.0)

## 2013-08-21 LAB — MICROALBUMIN / CREATININE URINE RATIO: Microalb, Ur: 0.1 mg/dL (ref 0.0–1.9)

## 2013-08-21 MED ORDER — TRAZODONE HCL 50 MG PO TABS: 50.0000 mg | ORAL_TABLET | Freq: Every day | ORAL | Status: DC

## 2013-08-21 NOTE — Assessment & Plan Note (Signed)
BP Readings from Last 3 Encounters:  08/21/13 140/96  05/27/13 124/68  03/18/13 128/80   BP slightly elevated today, but generally well controlled at home. Will continue current medications. Pt will call if BP consistently >140/90

## 2013-08-21 NOTE — Telephone Encounter (Signed)
Labs today including kidney, liver function. Thyroid function and cholesterol were normal.

## 2013-08-21 NOTE — Assessment & Plan Note (Signed)
Symptomatically doing well. Will check TSH with labs today. Continue Levothyroxine. 

## 2013-08-21 NOTE — Assessment & Plan Note (Signed)
Symptoms well controlled with prn Trazodone. Will continue.

## 2013-08-21 NOTE — Progress Notes (Signed)
Subjective:    Patient ID: Lindsay [name], female    DOB: [birth year], 72 y.o.   MRN: [id number]  HPI 72YO female with h/o HTN, depression, insomnia presents for follow up. Doing well. Compliant with medications. No chest pain, headache, palpitations. Reports BP generally well controlled, but did not bring record today. Symptoms of depression and insomnia well controlled with medications. No new concerns today. 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 Consider placing him in a facility, as his care has become increasingly difficult.  Outpatient Encounter Prescriptions as of 08/21/2013  Medication Sig Dispense Refill  . ALPRAZolam (XANAX) 0.25 MG tablet Take 1 tablet (0.25 mg total) by mouth 3 (three) times daily as needed for anxiety.  90 tablet  2  . bisoprolol-hydrochlorothiazide (ZIAC) 10-6.25 MG per tablet TAKE ONE (1) TABLET BY MOUTH EVERY      DAY  90 tablet  1  . HYDROcodone-acetaminophen (NORCO/VICODIN) 5-325 MG per tablet Take 1 tablet by mouth every 6 (six) hours as needed for pain.  45 tablet  0  . levothyroxine (SYNTHROID, LEVOTHROID) 50 MCG tablet TAKE ONE (1) TABLET BY MOUTH EVERY      DAY  90 tablet  1  . losartan (COZAAR) 50 MG tablet Take 1 tablet (50 mg total) by mouth daily.  90 tablet  1  . meloxicam (MOBIC) 7.5 MG tablet Take 1 tablet (7.5 mg total) by mouth daily.  90 tablet  1  . PARoxetine (PAXIL) 20 MG tablet Take 1 tablet (20 mg total) by mouth daily.  90 tablet  1  . traZODone (DESYREL) 50 MG tablet Take 1 tablet (50 mg total) by mouth at bedtime.  90 tablet  1   No facility-administered encounter medications on file as of 08/21/2013.   BP 140/96  Pulse 61  Temp(Src) 98 F (36.7 C) (Oral)  Wt 169 lb (76.658 kg)  BMI 28.12 kg/m2  SpO2 99%  Review of Systems  Constitutional: Negative for fever, chills, appetite change, fatigue and unexpected weight change.  HENT: Negative for ear pain, congestion, sore throat, trouble swallowing, neck  pain, voice change and sinus pressure.   Eyes: Negative for visual disturbance.  Respiratory: Negative for cough, shortness of breath, wheezing and stridor.   Cardiovascular: Negative for chest pain, palpitations and leg swelling.  Gastrointestinal: Negative for nausea, vomiting, abdominal pain, diarrhea, constipation, blood in stool, abdominal distention and anal bleeding.  Genitourinary: Negative for dysuria and flank pain.  Musculoskeletal: Negative for myalgias, arthralgias and gait problem.  Skin: Negative for color change and rash.  Neurological: Negative for dizziness and headaches.  Hematological: Negative for adenopathy. Does not bruise/bleed easily.  Psychiatric/Behavioral: Negative for suicidal ideas, sleep disturbance and dysphoric mood. The patient is not nervous/anxious.        Objective:   Physical Exam  Constitutional: She is oriented to person, place, and time. She appears well-developed and well-nourished. No distress.  HENT:  Head: Normocephalic and atraumatic.  Right Ear: External ear normal.  Left Ear: External ear normal.  Nose: Nose normal.  Mouth/Throat: Oropharynx is clear and moist. No oropharyngeal exudate.  Eyes: Conjunctivae are normal. Pupils are equal, round, and reactive to light. Right eye exhibits no discharge. Left eye exhibits no discharge. No scleral icterus.  Neck: Normal range of motion. Neck supple. No tracheal deviation present. No thyromegaly present.  Cardiovascular: Normal rate, regular rhythm, normal heart sounds and intact distal pulses.  Exam reveals no gallop and no friction rub.   No  murmur heard. Pulmonary/Chest: Effort normal and breath sounds normal. No accessory muscle usage. Not tachypneic. No respiratory distress. She has no decreased breath sounds. She has no wheezes. She has no rhonchi. She has no rales. She exhibits no tenderness.  Abdominal: Soft. Bowel sounds are normal. She exhibits no distension and no mass. There is no  tenderness. There is no rebound and no guarding.  Musculoskeletal: Normal range of motion. She exhibits no edema and no tenderness.  Lymphadenopathy:    She has no cervical adenopathy.  Neurological: She is alert and oriented to person, place, and time. No cranial nerve deficit. She exhibits normal muscle tone. Coordination normal.  Skin: Skin is warm and dry. No rash noted. She is not diaphoretic. No erythema. No pallor.  Psychiatric: She has a normal mood and affect. Her behavior is normal. Judgment and thought content normal.          Assessment & Plan:

## 2013-08-21 NOTE — Assessment & Plan Note (Signed)
Symptoms well controlled on current medications. Will continue. 

## 2013-08-24 ENCOUNTER — Encounter: Payer: Self-pay | Admitting: *Deleted

## 2013-08-24 NOTE — Telephone Encounter (Signed)
Left message to call back  

## 2013-08-24 NOTE — Telephone Encounter (Signed)
Patient was given this information when she came in with her husband today to have his PPD read

## 2013-09-04 ENCOUNTER — Telehealth: Payer: Self-pay | Admitting: Internal Medicine

## 2013-09-04 MED ORDER — VALACYCLOVIR HCL 1 G PO TABS: 1000.0000 mg | ORAL_TABLET | Freq: Two times a day (BID) | ORAL | Status: DC

## 2013-09-04 NOTE — Telephone Encounter (Signed)
Spoke with patient, she stated she would like medication of Valtrex sent to [facility]. Informed patient Dr. Dan Humphreys stated it may not help her since this is a recurrence, she wanted it called in anyway. Rx sent to pharmacy

## 2013-09-04 NOTE — Telephone Encounter (Signed)
Fwd to Dr. Walker 

## 2013-09-04 NOTE — Telephone Encounter (Signed)
States she was seen for shingles in June by [name].  States she is breaking out with the same thing again in the same area.  Asking if she can get medication called in to treat shingles.  Does not want the pain medicine that was prescribed last time, just wants medicine to treat shingles.  Did not take the pain medication previously.  Call cell, may leave msg if needed.

## 2013-09-04 NOTE — Telephone Encounter (Signed)
Needs to be seen

## 2013-09-28 ENCOUNTER — Other Ambulatory Visit: Payer: Self-pay | Admitting: *Deleted

## 2013-09-28 DIAGNOSIS — M1990 Unspecified osteoarthritis, unspecified site: Secondary | ICD-10-CM

## 2013-09-28 MED ORDER — MELOXICAM 7.5 MG PO TABS: 7.5000 mg | ORAL_TABLET | Freq: Every day | ORAL | Status: DC

## 2013-09-28 NOTE — Telephone Encounter (Signed)
Last visit 08/11/13, refill?

## 2013-10-08 ENCOUNTER — Other Ambulatory Visit: Payer: Self-pay

## 2014-01-04 ENCOUNTER — Telehealth: Payer: Self-pay | Admitting: Adult Health

## 2014-01-04 NOTE — Telephone Encounter (Signed)
The patient is wanting more medication called into the pharmacy for her shingles. The patient can't remember the name of her medication.

## 2014-01-04 NOTE — Telephone Encounter (Signed)
Please schedule her an appointment to be seen

## 2014-01-05 NOTE — Telephone Encounter (Signed)
The patient has been scheduled to be seen for her medication.

## 2014-01-06 ENCOUNTER — Ambulatory Visit (INDEPENDENT_AMBULATORY_CARE_PROVIDER_SITE_OTHER): Payer: Medicare Other | Admitting: Adult Health

## 2014-01-06 ENCOUNTER — Encounter: Payer: Self-pay | Admitting: Adult Health

## 2014-01-06 VITALS — BP 124/84 | HR 68 | Temp 98.2°F | Resp 12 | Wt 169.0 lb

## 2014-01-06 DIAGNOSIS — B029 Zoster without complications: Secondary | ICD-10-CM

## 2014-01-06 MED ORDER — VALACYCLOVIR HCL 1 G PO TABS: 1000.0000 mg | ORAL_TABLET | Freq: Two times a day (BID) | ORAL | Status: DC

## 2014-01-06 NOTE — Patient Instructions (Signed)
Start the Valtrex 1 tablet twice a day for 7 days. Return if symptoms do not improve.   Shingles Shingles is caused by the same virus that causes chickenpox. The first feelings may be pain or tingling. A rash will follow in a couple days. The rash may occur on any area of the body. Long-lasting pain is more likely in an elderly person. It can last months to years. There are medicines that can help prevent pain if you start taking them early. HOME CARE   Place cool cloths on the rash.  Only take medicine as told by your doctor.  You may use calamine lotion to relieve itchy skin.  Avoid touching:  Babies.  Children with inflamed skin (eczema).  People who have gotten transplanted organs.  People with chronic illnesses, such as leukemia and AIDS.  If the rash is on the face, you may need to see a specialist. Keep all appointments. Shingles must be kept away from the eyes, if possible.  Keep all appointments.  Avoid touching the eyes or eye area, if possible. GET HELP RIGHT AWAY IF:   You have any pain on the face or eye.  Your medicines do not help.  Your redness or puffiness (swelling) spreads.  You have a fever.  You notice any red lines going away from the rash area. MAKE SURE YOU:   Understand these instructions.  Will watch your condition.  Will get help right away if you are not doing well or get worse. Document Released: 05/07/2008 Document Revised: 02/11/2012 Document Reviewed: 05/07/2008 [facility] Patient Information 2014 [place], [place].

## 2014-01-06 NOTE — Progress Notes (Signed)
Patient ID: Lindsay [name], female   DOB: 13-Jan-1941, 73 y.o.   MRN: [id number]    Subjective:    Patient ID: Lindsay [name], female    DOB: June 24, 1941, 73 y.o.   MRN: [id number]  HPI  [name] is a pleasant 73 year old female with previous history of shingles who presents to clinic with same symptoms as before. She noticed a rash on her left buttocks on Monday. She reports that it feels exactly the same as when she had shingles prior.   Current Outpatient Prescriptions on File Prior to Visit  Medication Sig Dispense Refill  . bisoprolol-hydrochlorothiazide (ZIAC) 10-6.25 MG per tablet TAKE ONE (1) TABLET BY MOUTH EVERY      DAY  90 tablet  1  . levothyroxine (SYNTHROID, LEVOTHROID) 50 MCG tablet TAKE ONE (1) TABLET BY MOUTH EVERY      DAY  90 tablet  1  . losartan (COZAAR) 50 MG tablet Take 1 tablet (50 mg total) by mouth daily.  90 tablet  1  . meloxicam (MOBIC) 7.5 MG tablet Take 1 tablet (7.5 mg total) by mouth daily.  90 tablet  1   No current facility-administered medications on file prior to visit.     Review of Systems  Constitutional: Negative for fever and chills.  Respiratory: Negative.   Cardiovascular: Negative.   Skin: Positive for rash.       Painful, burning rash on left buttocks  Neurological: Negative for dizziness, numbness and headaches.  All other systems reviewed and are negative.       Objective:   Physical Exam  Constitutional: She is oriented to person, place, and time. She appears well-developed and well-nourished. No distress.  Cardiovascular: Normal rate and regular rhythm.   Pulmonary/Chest: Effort normal. No respiratory distress.  Musculoskeletal: Normal range of motion.  Neurological: She is alert and oriented to person, place, and time.  Skin:  Vesicular rash on left buttock.   Psychiatric: She has a normal mood and affect. Her behavior is normal. Judgment and thought content normal.   BP 124/84  Pulse 68  Temp(Src) 98.2 F (36.8 C) (Oral)   Resp 12  Wt 169 lb (76.658 kg)  SpO2 99%        Assessment & Plan:    1. Shingles Start valtrex 1000 mg bid x 7 days. RTC if no improvement in symptoms. Calamine lotion to help dry lesions. Recommend shingles vaccine for next visit.

## 2014-01-06 NOTE — Progress Notes (Signed)
Pre visit review using our clinic review tool, if applicable. No additional management support is needed unless otherwise documented below in the visit note. 

## 2014-01-08 ENCOUNTER — Telehealth: Payer: Self-pay | Admitting: Internal Medicine

## 2014-01-08 NOTE — Telephone Encounter (Signed)
Relevant patient education assigned to patient using Emmi. ° °

## 2014-01-13 ENCOUNTER — Other Ambulatory Visit: Payer: Self-pay | Admitting: Internal Medicine

## 2014-02-16 ENCOUNTER — Telehealth: Payer: Self-pay | Admitting: Internal Medicine

## 2014-04-16 ENCOUNTER — Other Ambulatory Visit: Payer: Self-pay | Admitting: Internal Medicine

## 2014-04-16 NOTE — Telephone Encounter (Signed)
Ok refill? 

## 2014-07-07 ENCOUNTER — Ambulatory Visit (INDEPENDENT_AMBULATORY_CARE_PROVIDER_SITE_OTHER): Payer: Medicare Other | Admitting: Internal Medicine

## 2014-07-07 ENCOUNTER — Encounter: Payer: Self-pay | Admitting: Internal Medicine

## 2014-07-07 VITALS — BP 140/88 | HR 74 | Temp 98.1°F | Ht 65.0 in | Wt 167.8 lb

## 2014-07-07 DIAGNOSIS — Z0000 Encounter for general adult medical examination without abnormal findings: Secondary | ICD-10-CM

## 2014-07-07 DIAGNOSIS — M79604 Pain in right leg: Secondary | ICD-10-CM

## 2014-07-07 DIAGNOSIS — Z23 Encounter for immunization: Secondary | ICD-10-CM

## 2014-07-07 DIAGNOSIS — Z1239 Encounter for other screening for malignant neoplasm of breast: Secondary | ICD-10-CM

## 2014-07-07 DIAGNOSIS — M79609 Pain in unspecified limb: Secondary | ICD-10-CM

## 2014-07-07 DIAGNOSIS — M25561 Pain in right knee: Secondary | ICD-10-CM | POA: Insufficient documentation

## 2014-07-07 LAB — COMPREHENSIVE METABOLIC PANEL
ALBUMIN: 3.9 g/dL (ref 3.5–5.2)
ALK PHOS: 47 U/L (ref 39–117)
ALT: 21 U/L (ref 0–35)
AST: 22 U/L (ref 0–37)
BUN: 14 mg/dL (ref 6–23)
CALCIUM: 9.3 mg/dL (ref 8.4–10.5)
CO2: 28 mEq/L (ref 19–32)
Chloride: 104 mEq/L (ref 96–112)
Creatinine, Ser: 0.7 mg/dL (ref 0.4–1.2)
GFR: 106.03 mL/min (ref >60.00)
GLUCOSE: 84 mg/dL (ref 70–99)
POTASSIUM: 3.8 meq/L (ref 3.5–5.1)
SODIUM: 140 meq/L (ref 135–145)
Total Bilirubin: 0.5 mg/dL (ref 0.2–1.2)
Total Protein: 7.1 g/dL (ref 6.0–8.3)

## 2014-07-07 LAB — TSH: TSH: 0.67 u[IU]/mL (ref 0.35–4.50)

## 2014-07-07 LAB — CBC WITH DIFFERENTIAL/PLATELET
BASOS ABS: 0 10*3/uL (ref 0.0–0.1)
Basophils Relative: 0.4 % (ref 0.0–3.0)
Eosinophils Absolute: 0.1 10*3/uL (ref 0.0–0.7)
Eosinophils Relative: 1.2 % (ref 0.0–5.0)
HCT: 41.2 % (ref 36.0–46.0)
Hemoglobin: 14.1 g/dL (ref 12.0–15.0)
LYMPHS ABS: 2.1 10*3/uL (ref 0.7–4.0)
Lymphocytes Relative: 31.8 % (ref 12.0–46.0)
MCHC: 34.1 g/dL (ref 30.0–36.0)
MCV: 88.8 fl (ref 78.0–100.0)
Monocytes Absolute: 0.6 10*3/uL (ref 0.1–1.0)
Monocytes Relative: 9.3 % (ref 3.0–12.0)
NEUTROS ABS: 3.8 10*3/uL (ref 1.4–7.7)
Neutrophils Relative %: 57.3 % (ref 43.0–77.0)
Platelets: 203 10*3/uL (ref 150.0–400.0)
RBC: 4.64 Mil/uL (ref 3.87–5.11)
RDW: 15.2 % (ref 11.5–15.5)
WBC: 6.7 10*3/uL (ref 4.0–10.5)

## 2014-07-07 LAB — MICROALBUMIN / CREATININE URINE RATIO
CREATININE, U: 291.2 mg/dL
MICROALB UR: 3 mg/dL — AB (ref 0.0–1.9)
Microalb Creat Ratio: 1 mg/g (ref 0.0–30.0)

## 2014-07-07 LAB — LIPID PANEL
CHOL/HDL RATIO: 3
Cholesterol: 219 mg/dL — ABNORMAL HIGH (ref 0–200)
HDL: 71.5 mg/dL (ref >39.00)
LDL CALC: 125 mg/dL — AB (ref 0–99)
NonHDL: 147.5
Triglycerides: 114 mg/dL (ref 0.0–149.0)
VLDL: 22.8 mg/dL (ref 0.0–40.0)

## 2014-07-07 MED ORDER — MELOXICAM 15 MG PO TABS: 15.0000 mg | ORAL_TABLET | Freq: Every day | ORAL | Status: DC

## 2014-07-07 NOTE — Assessment & Plan Note (Signed)
Right posterior leg pain over achilles. Will set up sports medicine evaluation to [place] and determine if c/w achilles tendonitis. Continue Meloxicam, increased dose to 15mg  daily.

## 2014-07-07 NOTE — Progress Notes (Signed)
Subjective:    Patient ID: Lindsay [name], female    DOB: 03-25-1941, 73 y.o.   MRN: [id number]  HPI The patient is here for annual Medicare wellness examination and management of other chronic and acute problems.   The risk factors are reflected in the social history.  The roster of all physicians providing medical care to patient - is listed in the Snapshot section of the chart.  Activities of daily living:  The patient is 100% independent in all ADLs: dressing, toileting, feeding as well as independent mobility  Home safety : The patient has smoke detectors in the home. They wear seatbelts.  There are no firearms at home. There is no violence in the home.   There is no risks for hepatitis, STDs or HIV. There is no  history of blood transfusion. They have no travel history to infectious disease endemic areas of the world.  The patient has seen their dentist in the last six month. ([name]) They have seen their eye doctor in the last year. ([facility]) No difficulty with hearing.  They have deferred audiologic testing in the last year.  They do not  have excessive sun exposure. Discussed the need for sun protection: hats, long sleeves and use of sunscreen if there is significant sun exposure.   Diet: the importance of a healthy diet is discussed. They do have a healthy diet.  The benefits of regular aerobic exercise were discussed. No current exercise. Discussed simple exercises for home.  Depression screen: there are no signs or vegative symptoms of depression- irritability, change in appetite, anhedonia, sadness/tearfullness. Some recent increased anxiety noted today.  Cognitive assessment: the patient manages all their financial and personal affairs and is actively engaged. They could relate day,date,year and events.  The following portions of the patient's history were reviewed and updated as appropriate: allergies, current medications, past family history, past medical  history,  past surgical history, past social history  and problem list.  Visual acuity was not assessed per patient preference since she has regular follow up with her ophthalmologist. Hearing and body mass index were assessed and reviewed.   During the course of the visit the patient was educated and counseled about appropriate screening and preventive services including : fall prevention , diabetes screening, nutrition counseling, colorectal cancer screening, and recommended immunizations.    Having right heel pain that radiates up back of leg. Ongoing for several months to one year. Seen by ortho, had xrays of lower back which showed DJD. Taking meloxicam with minimal improvement.  Review of Systems  Constitutional: Negative for fever, chills, appetite change, fatigue and unexpected weight change.  Eyes: Negative for visual disturbance.  Respiratory: Negative for shortness of breath.   Cardiovascular: Negative for chest pain and leg swelling.  Gastrointestinal: Negative for nausea, vomiting, abdominal pain, diarrhea, constipation and blood in stool.  Genitourinary: Negative for urgency and frequency.  Musculoskeletal: Positive for myalgias. Negative for arthralgias and gait problem.  Skin: Negative for color change and rash.  Hematological: Negative for adenopathy. Does not bruise/bleed easily.  Psychiatric/Behavioral: Negative for suicidal ideas, sleep disturbance, dysphoric mood and decreased concentration. The patient is not nervous/anxious.        Objective:    BP 140/88  Pulse 74  Temp(Src) 98.1 F (36.7 C) (Oral)  Ht 5\' 5"  (1.651 m)  Wt 167 lb 12 oz (76.091 kg)  BMI 27.92 kg/m2  SpO2 95% Physical Exam  Constitutional: She is oriented to person, place, and time. She appears  well-developed and well-nourished. No distress.  HENT:  Head: Normocephalic and atraumatic.  Right Ear: External ear normal.  Left Ear: External ear normal.  Nose: Nose normal.  Mouth/Throat:  Oropharynx is clear and moist. No oropharyngeal exudate.  Eyes: Conjunctivae are normal. Pupils are equal, round, and reactive to light. Right eye exhibits no discharge. Left eye exhibits no discharge. No scleral icterus.  Neck: Normal range of motion. Neck supple. No tracheal deviation present. No thyromegaly present.  Cardiovascular: Normal rate, regular rhythm, normal heart sounds and intact distal pulses.  Exam reveals no gallop and no friction rub.   No murmur heard. Pulmonary/Chest: Effort normal and breath sounds normal. No accessory muscle usage. Not tachypneic. No respiratory distress. She has no decreased breath sounds. She has no wheezes. She has no rales. She exhibits no tenderness. Right breast exhibits no inverted nipple, no mass, no nipple discharge, no skin change and no tenderness. Left breast exhibits no inverted nipple, no mass, no nipple discharge, no skin change and no tenderness. Breasts are symmetrical.  Abdominal: Soft. Bowel sounds are normal. She exhibits no distension and no mass. There is no tenderness. There is no rebound and no guarding.  Musculoskeletal: Normal range of motion. She exhibits no edema and no tenderness.       Legs: Lymphadenopathy:    She has no cervical adenopathy.  Neurological: She is alert and oriented to person, place, and time. No cranial nerve deficit. She exhibits normal muscle tone. Coordination normal.  Skin: Skin is warm and dry. No rash noted. She is not diaphoretic. No erythema. No pallor.  Psychiatric: She has a normal mood and affect. Her behavior is normal. Judgment and thought content normal.          Assessment & Plan:   Problem List Items Addressed This Visit     Unprioritized   Medicare annual wellness visit, subsequent - Primary     General exam including breast exam normal today. Pap deferred because of age and history of all normal Paps. Appropriate screening performed. Health maintenance is up to date except for mammogram  which was scheduled. Prevnar given. Labs today including CBC, CMP, lipid profile, TSH. Followup 6 months or sooner as needed.      Relevant Orders      TSH      Comprehensive metabolic panel      Lipid panel      Microalbumin / creatinine urine ratio      CBC with Differential   Right leg pain     Right posterior leg pain over achilles. Will set up sports medicine evaluation to [place] and determine if c/w achilles tendonitis. Continue Meloxicam, increased dose to 15mg  daily.    Relevant Orders      Ambulatory referral to Sports Medicine   Screening for breast cancer   Relevant Orders      MM Digital Screening       Return in about 6 months (around 01/07/2015) for Recheck.

## 2014-07-07 NOTE — Addendum Note (Signed)
Addended by: Vernetta Honey on: 07/07/2014 05:01 PM   Modules accepted: Orders

## 2014-07-07 NOTE — Patient Instructions (Signed)

## 2014-07-07 NOTE — Progress Notes (Signed)
Pre visit review using our clinic review tool, if applicable. No additional management support is needed unless otherwise documented below in the visit note. 

## 2014-07-07 NOTE — Assessment & Plan Note (Signed)
General exam including breast exam normal today. Pap deferred because of age and history of all normal Paps. Appropriate screening performed. Health maintenance is up to date except for mammogram which was scheduled. Prevnar given. Labs today including CBC, CMP, lipid profile, TSH. Followup 6 months or sooner as needed.

## 2014-07-08 ENCOUNTER — Encounter: Payer: Self-pay | Admitting: *Deleted

## 2014-07-14 ENCOUNTER — Other Ambulatory Visit: Payer: Self-pay | Admitting: Internal Medicine

## 2014-07-16 ENCOUNTER — Other Ambulatory Visit (INDEPENDENT_AMBULATORY_CARE_PROVIDER_SITE_OTHER): Payer: Medicare Other

## 2014-07-16 ENCOUNTER — Ambulatory Visit (INDEPENDENT_AMBULATORY_CARE_PROVIDER_SITE_OTHER): Payer: Medicare Other | Admitting: Family Medicine

## 2014-07-16 ENCOUNTER — Encounter: Payer: Self-pay | Admitting: Family Medicine

## 2014-07-16 VITALS — BP 140/86 | HR 66 | Ht 65.0 in | Wt 169.0 lb

## 2014-07-16 DIAGNOSIS — M79609 Pain in unspecified limb: Secondary | ICD-10-CM

## 2014-07-16 DIAGNOSIS — M79604 Pain in right leg: Secondary | ICD-10-CM

## 2014-07-16 DIAGNOSIS — M6528 Calcific tendinitis, other site: Secondary | ICD-10-CM

## 2014-07-16 DIAGNOSIS — M7660 Achilles tendinitis, unspecified leg: Secondary | ICD-10-CM | POA: Insufficient documentation

## 2014-07-16 DIAGNOSIS — M6520 Calcific tendinitis, unspecified site: Secondary | ICD-10-CM

## 2014-07-16 NOTE — Progress Notes (Signed)
[facility] [address], [place] [address] Phone: [phone] Subjective:    I'm seeing this patient by the request  of:  Rica Mast, MD   CC: Right leg pain  [id number] Lindsay [name] is a 73 y.o. female coming in with complaint of right leg pain. Patient has had this pain for quite some time. States it is on the posterior aspect near her ankle. States that it is worse with walking. Patient states it seems to be more at the heel. Can radiate the back of her leg. May have been going on for several years. Has seen orthopedics and had x-ray showing arthritis previously she states. Patient has tried anti-inflammatories that have been prescribed by primary care provider and has been minimally beneficial. Patient rates the severity of 6/10. Pain is stopping her from some activities but not her daily activities. Patient is an avid walker and would like to resume walking.     Past medical history, social, surgical and family history all reviewed in electronic medical record.   Review of Systems: No headache, visual changes, nausea, vomiting, diarrhea, constipation, dizziness, abdominal pain, skin rash, fevers, chills, night sweats, weight loss, swollen lymph nodes, body aches, joint swelling, muscle aches, chest pain, shortness of breath, mood changes.   Objective Blood pressure 140/86, pulse 66, height 5\' 5"  (1.651 m), weight 169 lb (76.658 kg), SpO2 99.00%.  General: No apparent distress alert and oriented x3 mood and affect normal, dressed appropriately.  HEENT: Pupils equal, extraocular movements intact  Respiratory: Patient's speak in full sentences and does not appear short of breath  Cardiovascular: No lower extremity edema, non tender, no erythema  Skin: Warm dry intact with no signs of infection or rash on extremities or on axial skeleton.  Abdomen: Soft nontender  Neuro: Cranial nerves II through XII are intact, neurovascularly  intact in all extremities with 2+ DTRs and 2+ pulses.  Lymph: No lymphadenopathy of posterior or anterior cervical chain or axillae bilaterally.  Gait normal with good balance and coordination.  MSK:  Non tender with full range of motion and good stability and symmetric strength and tone of shoulders, elbows, wrist, hip, knees bilaterally.  Ankle: Right No visible erythema or swelling. Range of motion is full in all directions. Strength is 5/5 in all directions. Stable lateral and medial ligaments; squeeze test and kleiger test unremarkable; Talar dome nontender; No pain at base of 5th MT; No tenderness over cuboid; No tenderness over N spot or navicular prominence No tenderness on posterior aspects of lateral and medial malleolus No sign of peroneal tendon subluxations or tenderness to palpation Negative tarsal tunnel tinel's Able to walk 4 steps. Contralateral ankle unremarkable  MSK US performed of: Right This study was ordered, performed, and interpreted by Charlann Boxer D.O.  Foot/Ankle:   All structures visualized.   Talar dome unremarkable  Ankle mortise without effusion. Peroneus longus and brevis tendons unremarkable on long and transverse views without sheath effusions. Posterior tibialis, flexor hallucis longus, and flexor digitorum longus tendons unremarkable on long and transverse views without sheath effusions. Achilles tendon does have calcific changes at its insertion. No true tear appreciated. Posterior calcaneal spur noted. Anterior Talofibular Ligament and Calcaneofibular Ligaments unremarkable and intact. Deltoid Ligament unremarkable and intact. Plantar fascia intact and without effusion, normal thickness. No increased doppler signal, cap sign, or thickening of tibial cortex. Power doppler signal normal.  IMPRESSION:  Calcific achilles tendonopathy. Posterior calcaneal spur also noted.  Impression and Recommendations:     This case required medical  decision making of moderate complexity.

## 2014-07-16 NOTE — Patient Instructions (Signed)
Very nice to meet you Wear the brace with activity.  Ice 20 minutes 2 times daily. Usually after activity and before bed. Better shoes will help.  New balance greater then 700.  Exercises 3 times a week.  Vitamin D 2000 IU daily Turmeric 500mg  twice daily Meloxicam daily.  Come back in 3 weeks

## 2014-07-16 NOTE — Assessment & Plan Note (Signed)
Patient does have calcific Achilles tendinosis as well as a posterior heel spur that is likely contributing to this. Patient is responding somewhat to the anti-inflammatories and no significant hypoechoic changes were seen on ultrasound today. Patient is going to do more with exercises and patient was given a pneumatic compression heal cup that I think will be helpful. We discussed home exercises and showed proper technique. Discussed an icing regimen as well. Discussed over-the-counter medications that can be beneficial as well. Patient will try these interventions as well as change her shoes and come back again in 3-4 weeks for further evaluation and treatment.

## 2014-07-19 ENCOUNTER — Encounter: Payer: Self-pay | Admitting: *Deleted

## 2014-07-19 LAB — HM MAMMOGRAPHY: HM MAMMO: NEGATIVE

## 2014-07-21 ENCOUNTER — Encounter: Payer: Self-pay | Admitting: Internal Medicine

## 2014-08-11 ENCOUNTER — Encounter: Payer: Self-pay | Admitting: Family Medicine

## 2014-08-11 ENCOUNTER — Ambulatory Visit (INDEPENDENT_AMBULATORY_CARE_PROVIDER_SITE_OTHER): Payer: Medicare Other | Admitting: Family Medicine

## 2014-08-11 VITALS — BP 132/84 | HR 72 | Ht 65.0 in | Wt 169.0 lb

## 2014-08-11 DIAGNOSIS — M25579 Pain in unspecified ankle and joints of unspecified foot: Secondary | ICD-10-CM

## 2014-08-11 DIAGNOSIS — M25571 Pain in right ankle and joints of right foot: Secondary | ICD-10-CM

## 2014-08-11 DIAGNOSIS — M6528 Calcific tendinitis, other site: Secondary | ICD-10-CM

## 2014-08-11 DIAGNOSIS — M6520 Calcific tendinitis, unspecified site: Secondary | ICD-10-CM

## 2014-08-11 NOTE — Assessment & Plan Note (Signed)
Patient's ultrasound today has made some improvement. Patient is no swelling and baseline in addition to this is going to be better. We discussed the possibility of formal physical therapy which patient declined. Doing the other thing I could offer her is the [employer]. Patient was placed in that today. We discussed continuing the icing and the exercises. Patient will continue with the medications as described previously. Patient's will do some more of a strength training. We will get an x-ray today as well to rule out any other bony pathology that could be contributing. Patient will followup again in 4 weeks.  Spent greater than 25 minutes with patient face-to-face and had greater than 50% of counseling including as described above in assessment and plan.

## 2014-08-11 NOTE — Progress Notes (Signed)
[name] [address], [place] [address] Phone: [phone] Subjective:    CC: Right leg pain  [id number] Lindsay [name] is a 73 y.o. female coming in with complaint of right leg pain. Patient was seen previously and did have calcific Achilles tendinosis. Patient also found a calcaneal spur noted. Patient was given home exercise program, pneumatic compression heel cup, and icing regimen. We discussed over-the-counter medications as well as proper shoe choices. Patient states she's been doing all of this areola basis and states that she is only approximately 10% better. Patient states even walking 200 feet can be painful in the posterior aspect of her ankle. Patient states that she is having difficulty with soreness with sitting as well. Denies any weakness, numbness, still ulnarly at the posterior aspect of the heel.     Past medical history, social, surgical and family history all reviewed in electronic medical record.   Review of Systems: No headache, visual changes, nausea, vomiting, diarrhea, constipation, dizziness, abdominal pain, skin rash, fevers, chills, night sweats, weight loss, swollen lymph nodes, body aches, joint swelling, muscle aches, chest pain, shortness of breath, mood changes.   Objective Blood pressure 132/84, pulse 72, height 5\' 5"  (1.651 m), weight 169 lb (76.658 kg), SpO2 99.00%.  General: No apparent distress alert and oriented x3 mood and affect normal, dressed appropriately.  HEENT: Pupils equal, extraocular movements intact  Respiratory: Patient's speak in full sentences and does not appear short of breath  Cardiovascular: No lower extremity edema, non tender, no erythema  Skin: Warm dry intact with no signs of infection or rash on extremities or on axial skeleton.  Abdomen: Soft nontender  Neuro: Cranial nerves II through XII are intact, neurovascularly intact in all extremities with 2+ DTRs and 2+ pulses.  Lymph:  No lymphadenopathy of posterior or anterior cervical chain or axillae bilaterally.  Gait normal with good balance and coordination.  MSK:  Non tender with full range of motion and good stability and symmetric strength and tone of shoulders, elbows, wrist, hip, knees bilaterally.  Ankle: Right No visible erythema or swelling. Range of motion is full in all directions. Strength is 5/5 in all directions. Stable lateral and medial ligaments; squeeze test and kleiger test unremarkable; Talar dome nontender; No pain at base of 5th MT; No tenderness over cuboid; No tenderness over N spot or navicular prominence No tenderness on posterior aspects of lateral and medial malleolus No sign of peroneal tendon subluxations or tenderness to palpation Negative tarsal tunnel tinel's Able to walk 4 steps. Contralateral ankle unremarkable  MSK US performed of: Right This study was ordered, performed, and interpreted by Charlann Boxer D.O.  Foot/Ankle:   All structures visualized.   Talar dome unremarkable  Ankle mortise without effusion. Peroneus longus and brevis tendons unremarkable on long and transverse views without sheath effusions. Posterior tibialis, flexor hallucis longus, and flexor digitorum longus tendons unremarkable on long and transverse views without sheath effusions. Achilles tendon  Has no calcific changes. This is a significant improvement from previous exam Posterior calcaneal spur noted. Anterior Talofibular Ligament and Calcaneofibular Ligaments unremarkable and intact. Deltoid Ligament unremarkable and intact. Plantar fascia intact and without effusion, normal thickness. No increased doppler signal, cap sign, or thickening of tibial cortex. Power doppler signal normal.  IMPRESSION: Significant decrease in calcific Achilles tendinosis but still posterior heel spur noted..     Impression and Recommendations:     This case required medical decision making of moderate  complexity.

## 2014-08-11 NOTE — Patient Instructions (Signed)
Good to see you It does look better today on ultrasound which is great! Wear boot daily for next 2-3 weeks.  Ice still at the end of the day Continue the exercises 3 times a week.  Vitamin D 2000 IU daily  Turmeric 500mg  twice daily.  Call me in 2 weeks if still in pain then we will consider physical therapy Otherwise I would like to se eyou again in 4 weeks.

## 2014-09-08 ENCOUNTER — Ambulatory Visit: Payer: Medicare Other | Admitting: Family Medicine

## 2014-09-08 DIAGNOSIS — Z0289 Encounter for other administrative examinations: Secondary | ICD-10-CM

## 2014-09-09 ENCOUNTER — Other Ambulatory Visit: Payer: Self-pay | Admitting: Internal Medicine

## 2014-11-24 ENCOUNTER — Encounter: Payer: Self-pay | Admitting: *Deleted

## 2014-12-08 NOTE — Telephone Encounter (Signed)
Mailed unread message to pt  

## 2015-01-12 ENCOUNTER — Other Ambulatory Visit: Payer: Self-pay | Admitting: Internal Medicine

## 2015-03-18 ENCOUNTER — Telehealth: Payer: Self-pay | Admitting: Gastroenterology

## 2015-03-18 ENCOUNTER — Encounter: Payer: Self-pay | Admitting: Internal Medicine

## 2015-03-18 ENCOUNTER — Ambulatory Visit (INDEPENDENT_AMBULATORY_CARE_PROVIDER_SITE_OTHER): Payer: Medicare Other | Admitting: Internal Medicine

## 2015-03-18 VITALS — BP 157/89 | HR 65 | Temp 97.9°F | Ht 65.0 in | Wt 163.5 lb

## 2015-03-18 DIAGNOSIS — F419 Anxiety disorder, unspecified: Secondary | ICD-10-CM | POA: Diagnosis not present

## 2015-03-18 DIAGNOSIS — K921 Melena: Secondary | ICD-10-CM

## 2015-03-18 LAB — COMPREHENSIVE METABOLIC PANEL
ALBUMIN: 4 g/dL (ref 3.5–5.2)
ALBUMIN: 3.6 g/dL
ALT: 17 U/L (ref 0–35)
ANION GAP: 4 — AB (ref 7–16)
AST: 17 U/L (ref 0–37)
AST: 24 U/L
Alkaline Phosphatase: 45 U/L (ref 39–117)
Alkaline Phosphatase: 41 U/L
BUN: 17 mg/dL (ref 6–23)
BUN: 22 mg/dL — AB
Bilirubin,Total: 0.3 mg/dL
CHLORIDE: 103 meq/L (ref 96–112)
CO2: 29 mEq/L (ref 19–32)
Calcium, Total: 8.3 mg/dL — ABNORMAL LOW
Calcium: 9.4 mg/dL (ref 8.4–10.5)
Chloride: 111 mmol/L
Co2: 27 mmol/L
Creatinine, Ser: 0.74 mg/dL (ref 0.40–1.20)
Creatinine: 0.78 mg/dL
EGFR (Non-African Amer.): >60
GFR: 99.24 mL/min (ref >60.00)
Glucose, Bld: 115 mg/dL — ABNORMAL HIGH (ref 70–99)
Glucose: 141 mg/dL — ABNORMAL HIGH
POTASSIUM: 4 meq/L (ref 3.5–5.1)
Potassium: 3.5 mmol/L
SGPT (ALT): 18 U/L
SODIUM: 138 meq/L (ref 135–145)
SODIUM: 142 mmol/L
TOTAL PROTEIN: 7.1 g/dL (ref 6.0–8.3)
TOTAL PROTEIN: 6.4 g/dL — AB
Total Bilirubin: 0.6 mg/dL (ref 0.2–1.2)

## 2015-03-18 LAB — CBC
HCT: 40.8 % (ref 36.0–46.0)
HCT: 35.9 % (ref 35.0–47.0)
HGB: 12.4 g/dL (ref 12.0–16.0)
Hemoglobin: 13.9 g/dL (ref 12.0–15.0)
MCH: 30.1 pg (ref 26.0–34.0)
MCHC: 34.1 g/dL (ref 30.0–36.0)
MCHC: 34.5 g/dL (ref 32.0–36.0)
MCV: 87.7 fl (ref 78.0–100.0)
MCV: 88 fL (ref 80–100)
Platelet: 159 10*3/uL (ref 150–440)
Platelets: 181 10*3/uL (ref 150.0–400.0)
RBC: 4.65 Mil/uL (ref 3.87–5.11)
RBC: 4.1 10*6/uL (ref 3.80–5.20)
RDW: 16 % — ABNORMAL HIGH (ref 11.5–15.5)
RDW: 14.5 % (ref 11.5–14.5)
WBC: 8.5 10*3/uL (ref 4.0–10.5)
WBC: 7.6 10*3/uL (ref 3.6–11.0)

## 2015-03-18 MED ORDER — ALPRAZOLAM 0.25 MG PO TABS: 0.2500 mg | ORAL_TABLET | Freq: Two times a day (BID) | ORAL | Status: DC | PRN

## 2015-03-18 NOTE — Addendum Note (Signed)
Addended by: Ronette Deter A on: 03/18/2015 10:26 AM   Modules accepted: Orders

## 2015-03-18 NOTE — Progress Notes (Signed)
Pre visit review using our clinic review tool, if applicable. No additional management support is needed unless otherwise documented below in the visit note. 

## 2015-03-18 NOTE — Assessment & Plan Note (Signed)
Will restart Alprazolam. Follow up in 3 weeks.

## 2015-03-18 NOTE — Patient Instructions (Addendum)
We will set up an evaluation with GI for a colonoscopy to evaluate your bleeding.  If any recurrent, persistent bleeding, then you should be evaluated immediately either in our office or in the ER.

## 2015-03-18 NOTE — Telephone Encounter (Signed)
I spoke with Lindsay [name] and she states the pt did have a history of colonoscopy at [facility] and she was looking into that and will call back if she still needs appt at [place]

## 2015-03-18 NOTE — Progress Notes (Addendum)
Subjective:    Patient ID: Lindsay [name], female    DOB: 1941/11/24, 74 y.o.   MRN: [id number]  HPI  74YO female presents for acute visit.  Rectal bleeding - Yesterday morning had BM with bright red blood. No clots or mucous seen. Second BM yesterday with small amount of blood. This morning had BM with no bleeding. No pain with BM. No constipation.  No abdominal pain, fever. Last colonoscopy 2010.  Anxiety - She also notes some worsening of anxiety recently. 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 SHe used Alprazolam in the past prn with some improvement. Would like to use this again.  Past medical, surgical, family and social history per today's encounter.  Review of Systems  Constitutional: Negative for fever, chills, appetite change, fatigue and unexpected weight change.  Eyes: Negative for visual disturbance.  Respiratory: Negative for shortness of breath.   Cardiovascular: Negative for chest pain and leg swelling.  Gastrointestinal: Positive for blood in stool. Negative for nausea, vomiting, abdominal pain, diarrhea, constipation, abdominal distention and rectal pain.  Skin: Negative for color change and rash.  Hematological: Negative for adenopathy. Does not bruise/bleed easily.  Psychiatric/Behavioral: Negative for dysphoric mood. The patient is not nervous/anxious.        Objective:    BP 157/89 mmHg  Pulse 65  Temp(Src) 97.9 F (36.6 C) (Oral)  Ht 5\' 5"  (1.651 m)  Wt 163 lb 8 oz (74.163 kg)  BMI 27.21 kg/m2  SpO2 98% Physical Exam  Constitutional: She is oriented to person, place, and time. She appears well-developed and well-nourished. No distress.  HENT:  Head: Normocephalic and atraumatic.  Right Ear: External ear normal.  Left Ear: External ear normal.  Nose: Nose normal.  Mouth/Throat: Oropharynx is clear and moist.  Eyes: Conjunctivae are normal. Pupils are equal, round, and reactive to light. Right eye exhibits no discharge.  Left eye exhibits no discharge. No scleral icterus.  Neck: Normal range of motion. Neck supple. No tracheal deviation present. No thyromegaly present.  Pulmonary/Chest: Effort normal. No accessory muscle usage. No tachypnea. She has no decreased breath sounds. She has no rhonchi.  Genitourinary: Rectal exam shows no external hemorrhoid, no internal hemorrhoid, no fissure, no mass, no tenderness and anal tone normal. Guaiac positive stool.  Musculoskeletal: Normal range of motion. She exhibits no edema or tenderness.  Lymphadenopathy:    She has no cervical adenopathy.  Neurological: She is alert and oriented to person, place, and time. No cranial nerve deficit. She exhibits normal muscle tone. Coordination normal.  Skin: Skin is warm and dry. No rash noted. She is not diaphoretic. No erythema. No pallor.  Psychiatric: She has a normal mood and affect. Her behavior is normal. Judgment and thought content normal.          Assessment & Plan:   Problem List Items Addressed This Visit      Unprioritized   Anxiety    Will restart Alprazolam. Follow up in 3 weeks.      Relevant Medications   ALPRAZolam (XANAX) 0.25 MG tablet   Hematochezia - Primary    Recent episodes of hematochezia. Exam normal today, however stool guaic pos. Question internal hemorrhoid. Will check CBC with labs. Will set up GI evaluation for colonoscopy. She will seek care through the ED if any recurrent/ persistent bleeding this weekend.      Relevant Orders   Ambulatory referral to Gastroenterology   CBC   Comprehensive metabolic panel  Return in about 3 weeks (around 04/08/2015) for Recheck.

## 2015-03-18 NOTE — Assessment & Plan Note (Signed)
Recent episodes of hematochezia. Exam normal today, however stool guaic pos. Question internal hemorrhoid. Will check CBC with labs. Will set up GI evaluation for colonoscopy. She will seek care through the ED if any recurrent/ persistent bleeding this weekend.

## 2015-03-19 ENCOUNTER — Inpatient Hospital Stay
Admit: 2015-03-19 | Disposition: A | Discharge status: Home / Self Care | Payer: Self-pay | Attending: Internal Medicine | Admitting: Internal Medicine

## 2015-03-19 LAB — OCCULT BLOOD X 1 CARD TO LAB, STOOL: Occult Blood, Feces: POSITIVE

## 2015-03-19 LAB — HEMOGLOBIN
HGB: 9.9 g/dL — AB (ref 12.0–16.0)
HGB: 8.5 g/dL — AB (ref 12.0–16.0)

## 2015-03-20 LAB — BASIC METABOLIC PANEL
ANION GAP: 1 — AB (ref 7–16)
BUN: 11 mg/dL
CHLORIDE: 117 mmol/L — AB
CO2: 27 mmol/L
CREATININE: 0.63 mg/dL
Calcium, Total: 7.8 mg/dL — ABNORMAL LOW
GLUCOSE: 122 mg/dL — AB
Potassium: 3.9 mmol/L
Sodium: 145 mmol/L

## 2015-03-20 LAB — CBC WITH DIFFERENTIAL/PLATELET
Basophil #: 0 10*3/uL (ref 0.0–0.1)
Basophil %: 0.6 %
Eosinophil #: 0.1 10*3/uL (ref 0.0–0.7)
Eosinophil %: 1.2 %
HCT: 24.8 % — ABNORMAL LOW (ref 35.0–47.0)
HGB: 8.4 g/dL — ABNORMAL LOW (ref 12.0–16.0)
Lymphocyte #: 2 10*3/uL (ref 1.0–3.6)
Lymphocyte %: 28.1 %
MCH: 30.1 pg (ref 26.0–34.0)
MCHC: 34 g/dL (ref 32.0–36.0)
MCV: 88 fL (ref 80–100)
MONO ABS: 0.7 x10 3/mm (ref 0.2–0.9)
MONOS PCT: 9.4 %
Neutrophil #: 4.3 10*3/uL (ref 1.4–6.5)
Neutrophil %: 60.7 %
Platelet: 119 10*3/uL — ABNORMAL LOW (ref 150–440)
RBC: 2.81 10*6/uL — AB (ref 3.80–5.20)
RDW: 15.2 % — ABNORMAL HIGH (ref 11.5–14.5)
WBC: 7 10*3/uL (ref 3.6–11.0)

## 2015-03-21 ENCOUNTER — Telehealth: Payer: Self-pay | Admitting: *Deleted

## 2015-03-21 NOTE — Telephone Encounter (Signed)
Pt aware of appt.

## 2015-03-21 NOTE — Telephone Encounter (Signed)
TCM call completed. Needs appt, no 30 minute appointments within 2 weeks, please advise appt, would like appt with dr walker

## 2015-03-21 NOTE — Telephone Encounter (Addendum)
Discharge Date: 03/20/15  Transition Care Management Follow-up Telephone Call  How have you been since you were released from the hospital? Pt has had no more rectal bleeding since discharge. A little weak, but improving.    Do you understand why you were in the hospital? YES, GI bleed, acute anemia.   Do you understand the discharge instructions? YES. Stop ASA. Start Protonix, see below.   Items Reviewed:  Medications reviewed: YES. Pt aware to stop ASA. Start Pantoprazole 40 mg once daily.   Allergies reviewed: YES, no new allergies  Dietary changes reviewed: Yes, soft diet until sees Dr. Allen Norris 03/29/15  Referrals reviewed: n/a   Functional Questionnaire:   Activities of Daily Living (ADLs):  She states they are independent in the following: Independent with all ADLs States they require assistance with the following: None   Any transportation issues/concerns?: No, drives self   Any patient concerns? No concerns at this time.    Confirmed importance and date/time of follow-up visits scheduled: YES appt with Dr. Gilford Rile 03/24/15 @ 3:45.  Has follow up with Dr. Allen Norris scheduled 03/29/15.    Confirmed with patient if condition begins to worsen call PCP or go to the ER. Patient was given the Call-a-Nurse line [phone]: YES

## 2015-03-21 NOTE — Telephone Encounter (Signed)
3:45pm on Thursday for 12min

## 2015-03-21 NOTE — Telephone Encounter (Signed)
Received documentation that pt was admitted in [facility] and discharged on 03/20/15.

## 2015-03-24 ENCOUNTER — Ambulatory Visit (INDEPENDENT_AMBULATORY_CARE_PROVIDER_SITE_OTHER): Payer: Medicare Other | Admitting: Internal Medicine

## 2015-03-24 ENCOUNTER — Encounter: Payer: Self-pay | Admitting: Internal Medicine

## 2015-03-24 VITALS — BP 160/82 | HR 68 | Temp 98.3°F | Resp 14 | Ht 65.0 in | Wt 162.6 lb

## 2015-03-24 DIAGNOSIS — K921 Melena: Secondary | ICD-10-CM | POA: Diagnosis not present

## 2015-03-24 NOTE — Progress Notes (Signed)
Subjective:    Patient ID: Lindsay [name], female    DOB: Dec 06, 1940, 74 y.o.   MRN: [id number]  HPI 74YO female presents for hospital follow up.  ADMITTED 03/19/2015 DISCHARGED 03/20/2015  DIAGNOSIS: Hematochezia  Friday night developed recurrent bleeding. Large amount of blood with stool. Went to ED.  Had syncopal episode at [facility]. Hgb dropped from 12.4 to 8.4, then was stable. Pt was never transfused. Seen by Dr. Allen Norris in the hospital.  Feeling exhausted at home. BM at home were initially dark, then normal brown. No abdominal pain. Not eating much, because afraid of recurrent bleeding.  Follow up with Dr. Allen Norris next week pending.   Past medical, surgical, family and social history per today's encounter.  Review of Systems  Constitutional: Positive for fatigue. Negative for fever, chills, appetite change and unexpected weight change.  Eyes: Negative for visual disturbance.  Respiratory: Negative for shortness of breath.   Cardiovascular: Negative for chest pain and leg swelling.  Gastrointestinal: Positive for blood in stool. Negative for nausea, vomiting, abdominal pain, diarrhea, constipation and abdominal distention.  Skin: Negative for color change and rash.  Hematological: Negative for adenopathy. Does not bruise/bleed easily.  Psychiatric/Behavioral: Negative for dysphoric mood. The patient is not nervous/anxious.        Objective:    BP 160/82 mmHg  Pulse 68  Temp(Src) 98.3 F (36.8 C) (Oral)  Resp 14  Ht 5\' 5"  (1.651 m)  Wt 162 lb 9.6 oz (73.755 kg)  BMI 27.06 kg/m2  SpO2 96% Physical Exam  Constitutional: She is oriented to person, place, and time. She appears well-developed and well-nourished. No distress.  HENT:  Head: Normocephalic and atraumatic.  Right Ear: External ear normal.  Left Ear: External ear normal.  Nose: Nose normal.  Mouth/Throat: Oropharynx is clear and moist. No oropharyngeal exudate.  Eyes: Conjunctivae and EOM are normal. Pupils are  equal, round, and reactive to light. Right eye exhibits no discharge.  Neck: Normal range of motion. Neck supple. No thyromegaly present.  Cardiovascular: Normal rate, regular rhythm, normal heart sounds and intact distal pulses.  Exam reveals no gallop and no friction rub.   No murmur heard. Pulmonary/Chest: Effort normal. No respiratory distress. She has no wheezes. She has no rales.  Abdominal: Soft. Bowel sounds are normal. She exhibits no distension and no mass. There is no tenderness. There is no rebound and no guarding.  Musculoskeletal: Normal range of motion. She exhibits no edema or tenderness.  Lymphadenopathy:    She has no cervical adenopathy.  Neurological: She is alert and oriented to person, place, and time. No cranial nerve deficit. Coordination normal.  Skin: Skin is warm and dry. No rash noted. She is not diaphoretic. No erythema. No pallor.  Psychiatric: She has a normal mood and affect. Her behavior is normal. Judgment and thought content normal.          Assessment & Plan:   Problem List Items Addressed This Visit      Unprioritized   Hematochezia - Primary    Recent hematochezia. Suspected diverticular bleeding, however no imaging/endoscopy performed.Admission complicated by hypotension, syncopal episode, hgb drop from 12.4 to 8.4. Did not receive blood products during hospitalization. Will recheck CBC today. GI follow up pending for next week. Encouraged diet rich in iron. If any recurrent bleeding this weekend, will go to the ED immediately.      Relevant Orders   CBC   Comprehensive metabolic panel       Return in  about 4 weeks (around 04/21/2015) for Recheck.

## 2015-03-24 NOTE — Assessment & Plan Note (Addendum)
Recent hematochezia. Suspected diverticular bleeding, however no imaging/endoscopy performed.Admission complicated by hypotension, syncopal episode, hgb drop from 12.4 to 8.4. Did not receive blood products during hospitalization. Will recheck CBC today. GI follow up pending for next week. Encouraged diet rich in iron. If any recurrent bleeding this weekend, will go to the ED immediately.

## 2015-03-24 NOTE — Patient Instructions (Signed)
Labs today.  Follow up with Dr. Allen Norris next week.

## 2015-03-24 NOTE — Progress Notes (Signed)
Pre visit review using our clinic review tool, if applicable. No additional management support is needed unless otherwise documented below in the visit note. 

## 2015-03-25 LAB — COMPREHENSIVE METABOLIC PANEL
ALBUMIN: 4 g/dL (ref 3.5–5.2)
ALK PHOS: 42 U/L (ref 39–117)
ALT: 17 U/L (ref 0–35)
AST: 20 U/L (ref 0–37)
BUN: 6 mg/dL (ref 6–23)
CHLORIDE: 106 meq/L (ref 96–112)
CO2: 30 mEq/L (ref 19–32)
Calcium: 9.3 mg/dL (ref 8.4–10.5)
Creatinine, Ser: 0.81 mg/dL (ref 0.40–1.20)
GFR: 89.41 mL/min (ref >60.00)
Glucose, Bld: 97 mg/dL (ref 70–99)
Potassium: 4.2 mEq/L (ref 3.5–5.1)
SODIUM: 141 meq/L (ref 135–145)
Total Bilirubin: 0.3 mg/dL (ref 0.2–1.2)
Total Protein: 6.8 g/dL (ref 6.0–8.3)

## 2015-03-25 LAB — CBC
HCT: 28.6 % — ABNORMAL LOW (ref 36.0–46.0)
Hemoglobin: 9.7 g/dL — ABNORMAL LOW (ref 12.0–15.0)
MCHC: 33.7 g/dL (ref 30.0–36.0)
MCV: 89.7 fl (ref 78.0–100.0)
Platelets: 231 10*3/uL (ref 150.0–400.0)
RBC: 3.19 Mil/uL — AB (ref 3.87–5.11)
RDW: 15.9 % — AB (ref 11.5–15.5)
WBC: 8.9 10*3/uL (ref 4.0–10.5)

## 2015-03-29 ENCOUNTER — Telehealth: Payer: Self-pay | Admitting: *Deleted

## 2015-03-29 NOTE — Telephone Encounter (Signed)
VM from [place] at [facility], requesting lab results to be sent to their office. Faxed via Epic.

## 2015-03-29 NOTE — Telephone Encounter (Signed)
Pt calling about lab results. Advised they were released on mychart. She has been unable to log in to [employer]. She requests to have it deactivated. Notified of labs,  verbalized understanding

## 2015-04-03 NOTE — Consult Note (Signed)
PATIENT NAME:  Lindsay [name], Lindsay [name] MR#:  [id number] DATE OF BIRTH:  Mar 07, 1941  DATE OF CONSULTATION:  03/19/2015  CONSULTING PHYSICIAN:  Lucilla Lame, MD  REQUESTING SERVICE: Gastroenterology.    CONSULTING PHYSICIAN: Lucilla Lame, MD    REASON FOR CONSULTATION: Rectal bleed.   HISTORY OF PRESENT ILLNESS: This patient is a 74 year old woman who had a colonoscopy by me back in 2010 that showed diverticulosis throughout the entire colon. The patient has never had GI bleeding in the past but states that this past Thursday, she had some small amounts of bright red blood per rectum. The patient went to see her primary care doctor, Dr. Ronette Deter. At that time, the bleeding has slowed down and stopped. The patient was told that if she continued to have anymore rectal bleeding, she should be going to the emergency room. The patient was okay on Friday, but then started having large amounts of bright red blood per rectum on Friday night so she came into the emergency room. The patient states that this morning she has had 2 bowel movements which were much darker in color and did not look like fresh blood. The patient denies any abdominal pain, nausea, vomiting, fevers, chills or hematemesis during these episodes. The patient has been doing well otherwise.   PAST MEDICAL HISTORY: Hypothyroidism, arthritis, hypertension.   HOME MEDICATIONS: Losartan, Synthroid, aspirin and an hypertensive pill she does not recall the name of.   ALLERGIES: No known drug allergies.   FAMILY HISTORY: Noncontributory.   SOCIAL HISTORY: The patient smokes a half a pack of cigarettes for the last 50 years, drinks 2 to 3 drinks of vodka on the weekends. No IV drug use.   REVIEW OF SYSTEMS: A 10 point review of systems negative except what was stated above.   PHYSICAL EXAMINATION: VITAL SIGNS: Temperature 98.3, pulse 73, respirations 18, blood pressure 146/79, pulse oximetry 100%.  GENERAL: The patient sitting up in bed in no  apparent distress. No complaints at the present time.  HEENT: Normocephalic, atraumatic. Extraocular motor intact. Pupils equally reactive to light and accommodation without JVD, without lymphadenopathy.  LUNGS: Clear to auscultation bilaterally.  HEART: Regular rate and rhythm without murmurs, rubs or gallops.  ABDOMEN: Soft, nontender, nondistended, without hepatosplenomegaly.  EXTREMITIES: Without cyanosis, clubbing or edema.  NEUROLOGICAL: Cranial nerves II through XII grossly intact without any focal signs.  PSYCHIATRIC: The patient is alert and oriented x 3. Cooperative.   ANCILLARY SERVICES: As stated above. Hemoglobin on admission 12.4. This morning it was 9.9.   ASSESSMENT AND PLAN: This patient is a 74 year old woman who likely has a lower gastrointestinal bleed caused by diverticulosis. The patient has no pain and has been doing well. Her stools have turned dark and she is having less frequent bowel movements. The patient will be followed throughout the admission. No need to rush into a colonoscopy at this time, since the bleeding appears to have slowed down. If the patient should have any further bleeding, a colonoscopy may be needed, although if it is a massive amount of bleeding, the patient may need a bleeding scan with interventional radiology doing embolization of any vessels that are bleeding.   Thank you very much for involving me in the care of this patient. If you have any questions, please do not hesitate to call.     ____________________________ Lucilla Lame, MD dw:TT D: 03/19/2015 14:32:49 ET T: 03/19/2015 15:26:49 ET JOB#: [id number]  cc: Lucilla Lame, MD, <Dictator> Lucilla Lame MD ELECTRONICALLY SIGNED 03/20/2015  9:14 

## 2015-04-03 NOTE — Discharge Summary (Signed)
PATIENT NAME:  Lindsay [name], Lindsay [name] MR#:  [id number] DATE OF BIRTH:  [birth year]  DATE OF ADMISSION:  03/19/2015 DATE OF DISCHARGE:  03/20/2015  ADMITTING PHYSICIAN: Lance Coon, MD  DISCHARGING PHYSICIAN: Gladstone Lighter, MD  [place]: GI consultation by Dr. Lucilla Lame.  PRIMARY CARE PHYSICIAN: Ronette Deter, MD  DISCHARGE DIAGNOSES: 1. Bright red blood per rectum, likely diverticular bleed.  2. Acute on chronic anemia, not needing any transfusions this admission.  3. Hypertension.  4. Hypothyroidism.  5. Osteoarthritis.   DISCHARGE HOME MEDICATIONS:  1. Multivitamin with minerals 1 tablet p.o. daily.  2. Vitamin B12 - 1000 mcg p.o. daily.  3. Synthroid 50 mcg p.o. daily. 4. Losartan 50 mg p.o. daily.  5. Meloxicam 15 mg p.o. daily.  6. Bisoprolol/hydrochlorothiazide 10 mg/6.25 mg 1 tablet p.o. daily.  7. Protonix 40 mg p.o. daily.   DISCHARGE DIET: Low-sodium diet.   DISCHARGE ACTIVITY: As tolerated.    FOLLOWUP INSTRUCTIONS: 1. GI follow-up with Dr. Lucilla Lame for outpatient colonoscopy in 2 weeks.  2. PCP follow-up in 1 to 2 weeks.   LABORATORIES AND IMAGING STUDIES: Prior to discharge: WBC 7.0, hemoglobin 8.4, hematocrit 24.8, platelet count 119,000. Sodium 147, potassium 3.9, chloride 117, bicarbonate 27, BUN 11, creatinine 0.63, glucose 122 and calcium of 7.8. Stool for occult blood is positive. Hemoglobin did drop from 12.4 to 8.4.   BRIEF HOSPITAL COURSE: Ms. Valcarcel is a 74 year old, African-American female with past medical history significant for known diverticulosis from a previous colonoscopy in 2010, hypertension, hypothyroidism, presents to the hospital secondary to large quantities of bright red blood per rectum.  1. Rectal bleed, likely diverticular bleed again, stopped by itself. The patient is still having some dark stools at this time but no active bleeding. Hemoglobin gradually dropped to 8.5 but has remained steady at that without any  active bleeding. No further drop in blood pressure. The patient is being discharged on Protonix. Aspirin is being held. The patient was seen by Dr. Allen Norris in the hospital and will likely need an outpatient colonoscopy, which will be done as an outpatient.  2. Hypertension, she was hypotensive with all the bleeding she came in with. With fluid resuscitation, blood pressure has improved and is actually hypertension, so her losartan, bisoprolol/hydrochlorothiazide are being started at the time of discharge.  3. Hypothyroidism. Continue Synthroid.  4. Her course has been otherwise uneventful in the hospital.   DISCHARGE CONDITION: Stable.   DISCHARGE DISPOSITION: Home.   TIME SPENT ON DISCHARGE: 40 minutes.    ____________________________ Gladstone Lighter, MD rk:TT D: 03/20/2015 11:29:28 ET T: 03/20/2015 13:07:40 ET JOB#: [id number]  cc: Gladstone Lighter, MD, <Dictator> Lucilla Lame, MD Eduard Clos. Gilford Rile, MD Gladstone Lighter MD ELECTRONICALLY SIGNED 03/25/2015 15:14

## 2015-04-03 NOTE — Consult Note (Signed)
Brief Consult Note: Diagnosis: The patient comes in with hematochezia and a history of a colonoscopy by me in 2010 by me with diverticulosis thoughout the colon. She denies any pain at this time. Says the blood has tuned dark and is less then befoe..   Patient was seen by consultant.   Consult note dictated.   Comments: The patient has a lower GI bleed that is likely diverticular. The patients Hb has dopped bu the bleeding is less. Will follow Hb and conside a colonoscopy if bleeding does not stop or as an out patient. If large amount of bleeding then a bleeding scan and angiography may be needed.  Electronic Signatures: Lucilla Lame (MD)  (Signed 16-Apr-16 12:41)  Authored: Brief Consult Note   Last Updated: 16-Apr-16 12:41 by Lucilla Lame (MD)

## 2015-04-03 NOTE — H&P (Signed)
PATIENT NAME:  Lindsay [name], Lindsay [name] MR#:  [id number] DATE OF BIRTH:  08-26-1941  DATE OF ADMISSION:  03/19/2015  CHIEF COMPLAINT: Rectal bleed.   HISTORY OF PRESENT ILLNESS: This is a 74 year old female who comes to the ED for some intermittent episodes of bright red blood per rectum. States that on the day prior to admission in the morning she had a bowel movement and she saw blood on the paper when she wiped and then looked in the bowl and saw blood in the bowl as well. It was red blood. It was not melanotic stool. She says she has never had this before. She had a couple of other episodes between that time and the time that she actually came into the ED for evaluation. Not all of her bowel movements were bloody and the ones that did not have blood seemed to be formed and normal in consistency, caliber, and color. She states that she has not had any nausea, vomiting, diarrhea. No abdominal pain. No dizziness, shortness of breath, vision changes. She says that she had hemorrhoids as a child but has not had much problem with this as an adult. However, she has had some significant and persistent constipation and takes a stool softener fairly regularly. She denies any kind of fever or other infectious symptoms and her labs in the ED were all largely within normal limits. Hospitalists were consulted for admission for hematochezia.   PRIMARY CARE PHYSICIAN: Anderson Malta A. Gilford Rile, MD  PAST MEDICAL HISTORY: Hypothyroidism, arthritis, hypertension.   CURRENT MEDICATIONS: Losartan 50 mg daily, Synthroid 25 mcg daily, aspirin 81 mg daily. The patient states she takes another antihypertensive but she does not recall the name of it at this time.   PAST SURGICAL HISTORY: Partial hysterectomy and a wrist and foot surgery.   ALLERGIES: No known drug allergies.   FAMILY HISTORY: CAD, diabetes.   SOCIAL HISTORY: Smokes half pack per day for the last 50 years, drinks 2 to 3 drinks with vodka on the weekends, and denies  illicit drug use.   REVIEW OF SYSTEMS:  CONSTITUTIONAL: Denies fever, fatigue, weakness.  EYES: Denies blurred or double vision, pain or redness.  EAR, NOSE, AND THROAT: Denies ear pain, hearing loss, difficulty swallowing.  RESPIRATORY: Denies cough, dyspnea, painful respiration.  CARDIOVASCULAR: Denies chest pain, edema, palpitations.  GASTROINTESTINAL: Denies nausea, vomiting, diarrhea. Denies abdominal pain. Endorses some chronic constipation as well as some bright red blood per rectum.  GENITOURINARY: Denies dysuria, hematuria, frequency.  ENDOCRINE: Denies nocturia, thyroid problems, heat or cold intolerance.  HEMATOLOGIC AND LYMPHATIC: Denies easy bruising, bleeding, swollen glands.  INTEGUMENTARY: Denies acne, rash, or lesion.  MUSCULOSKELETAL: Denies acute arthritis, joint swelling, gout.  NEUROLOGICAL: Denies numbness, weakness, headache.  PSYCHIATRIC: Denies anxiety, insomnia, depression.   PHYSICAL EXAMINATION:  VITAL SIGNS: Blood pressure 168/83, pulse 73, temperature 98.3, respirations 16, O2 saturation is 97% on room air.  GENERAL: This is a well-nourished, well-developed female laying supine in bed in no acute distress.  HEENT: Pupils equal, round, react to light and accommodation. Extraocular movements intact. No scleral icterus. Moist mucosal membranes.  NECK: Thyroid is not enlarged. No goiter. Neck is supple. No mass, nontender. No cervical adenopathy. No JVD.  RESPIRATORY: Clear to auscultation bilaterally with no rales, rhonchi, or wheezing. No respiratory distress.  CARDIOVASCULAR: Regular rate and rhythm. No murmur, rub, or gallop and good pedal pulses with no lower extremity edema.  ABDOMEN: Soft, nontender, nondistended with good bowel sounds.  MUSCULOSKELETAL: Muscular strength 5/5 in all  4 extremities. Full spontaneous range of motion throughout. No cyanosis or clubbing.  SKIN: No rash or lesions. Skin is warm, dry, and intact.  ADENOPATHY: No lymphadenopathy.   NEUROLOGIC: Cranial nerves intact. Sensation is intact throughout. No dysarthria or aphasia.  PSYCHIATRIC: The patient is alert and oriented x 3, cooperative, not confused or agitated.   LABORATORY DATA: White cells 7.6, hemoglobin 12.4, hematocrit 35.9, platelets 159,000. Sodium 142, potassium 3.5, chloride 111, CO2 27, BUN 22, creatinine 0.78, glucose 141, calcium 8.3, total protein 6.4, albumin 3.6, total bilirubin 0.3, alkaline phosphatase 41, AST 24, ALT 18.   RADIOLOGY: No radiographic imaging.   ASSESSMENT AND PLAN:  1.  Hematochezia. This is potentially due to some hemorrhoids, however, given the recurrent episodes and the patient reported large volume stools, we will admit her, get a GI consult for the morning to see if they want to do any sort of colonoscopy. The patient has stable labs at this time. We will trend her hemoglobin specifically to make sure it does not fall with the morning labs. Her blood pressure is stable at this time as well, it is actually on the high end, so we will treat that as per hypertension listed problem below. We will hold her aspirin at this time given the report of the bleeding.  2.  Hypertension. We will continue her home medications as well as some IV p.r.n. medication as needed to keep her blood pressure below 732 systolic.  3.  Hypothyroidism, chronic, stable problem. We will continue her home thyroid replacement for this.  4.  Deep vein thrombosis prophylaxis for this patient will be with SCDs.    CODE STATUS: This patient is full code.   TIME SPENT ON THIS ADMISSION: 45 minutes.   ____________________________ Wilford Corner. Jannifer Franklin, MD dfw:AT D: 03/19/2015 01:34:50 ET T: 03/19/2015 02:31:41 ET JOB#: [id number]  cc: Wilford Corner. Jannifer Franklin, MD, <Dictator> Mikisha Roseland Fawn Kirk MD ELECTRONICALLY SIGNED 03/19/2015 3:14

## 2015-04-15 NOTE — Discharge Instructions (Signed)

## 2015-04-18 ENCOUNTER — Encounter: Payer: Self-pay | Admitting: *Deleted

## 2015-04-18 ENCOUNTER — Encounter
Admission: RE | Disposition: A | Discharge status: Home / Self Care | Payer: Self-pay | Source: Ambulatory Visit | Attending: Gastroenterology

## 2015-04-18 ENCOUNTER — Other Ambulatory Visit: Payer: Self-pay | Admitting: Gastroenterology

## 2015-04-18 ENCOUNTER — Ambulatory Visit
Admission: RE | Admit: 2015-04-18 | Discharge: 2015-04-18 | Disposition: A | Discharge status: Home / Self Care | Payer: Medicare Other | Source: Ambulatory Visit | Attending: Gastroenterology | Admitting: Gastroenterology

## 2015-04-18 ENCOUNTER — Ambulatory Visit: Payer: Medicare Other | Admitting: Anesthesiology

## 2015-04-18 DIAGNOSIS — Z8249 Family history of ischemic heart disease and other diseases of the circulatory system: Secondary | ICD-10-CM | POA: Diagnosis not present

## 2015-04-18 DIAGNOSIS — I10 Essential (primary) hypertension: Secondary | ICD-10-CM | POA: Insufficient documentation

## 2015-04-18 DIAGNOSIS — K921 Melena: Secondary | ICD-10-CM | POA: Insufficient documentation

## 2015-04-18 DIAGNOSIS — K640 First degree hemorrhoids: Secondary | ICD-10-CM | POA: Insufficient documentation

## 2015-04-18 DIAGNOSIS — Z833 Family history of diabetes mellitus: Secondary | ICD-10-CM | POA: Insufficient documentation

## 2015-04-18 DIAGNOSIS — D125 Benign neoplasm of sigmoid colon: Secondary | ICD-10-CM | POA: Diagnosis not present

## 2015-04-18 DIAGNOSIS — K625 Hemorrhage of anus and rectum: Secondary | ICD-10-CM | POA: Diagnosis present

## 2015-04-18 DIAGNOSIS — D509 Iron deficiency anemia, unspecified: Secondary | ICD-10-CM | POA: Diagnosis present

## 2015-04-18 DIAGNOSIS — E039 Hypothyroidism, unspecified: Secondary | ICD-10-CM | POA: Insufficient documentation

## 2015-04-18 DIAGNOSIS — K5730 Diverticulosis of large intestine without perforation or abscess without bleeding: Secondary | ICD-10-CM | POA: Diagnosis not present

## 2015-04-18 DIAGNOSIS — Z79899 Other long term (current) drug therapy: Secondary | ICD-10-CM | POA: Diagnosis not present

## 2015-04-18 DIAGNOSIS — M1990 Unspecified osteoarthritis, unspecified site: Secondary | ICD-10-CM | POA: Diagnosis not present

## 2015-04-18 HISTORY — PX: COLONOSCOPY: SHX5424

## 2015-04-18 HISTORY — PX: POLYPECTOMY: SHX149

## 2015-04-18 HISTORY — DX: Anemia, unspecified: D64.9

## 2015-04-18 HISTORY — DX: Melena: K92.1

## 2015-04-18 HISTORY — DX: Anxiety disorder, unspecified: F41.9

## 2015-04-18 HISTORY — DX: Diverticulosis of intestine, part unspecified, without perforation or abscess without bleeding: K57.90

## 2015-04-18 HISTORY — DX: Unspecified osteoarthritis, unspecified site: M19.90

## 2015-04-18 SURGERY — COLONOSCOPY
Anesthesia: General | Wound class: Contaminated

## 2015-04-18 MED ORDER — PROPOFOL 10 MG/ML IV BOLUS
INTRAVENOUS | Status: DC | PRN
  Administered 2015-04-18: 50 mg via INTRAVENOUS
  Administered 2015-04-18: 100 mg via INTRAVENOUS
  Administered 2015-04-18 (×2): 50 mg via INTRAVENOUS

## 2015-04-18 MED ORDER — LACTATED RINGERS IV SOLN
INTRAVENOUS | Status: DC
Start: 2015-04-18 — End: 2015-04-18
  Administered 2015-04-18: 09:00:00 via INTRAVENOUS

## 2015-04-18 MED ORDER — OXYCODONE HCL 5 MG/5ML PO SOLN: 5.0000 mg | Freq: Once | ORAL | Status: DC | PRN

## 2015-04-18 MED ORDER — ACETAMINOPHEN 325 MG PO TABS
325.0000 mg | ORAL_TABLET | ORAL | Status: DC | PRN
Start: 2015-04-18 — End: 2015-04-18

## 2015-04-18 MED ORDER — DEXAMETHASONE SODIUM PHOSPHATE 4 MG/ML IJ SOLN: 8.0000 mg | Freq: Once | INTRAMUSCULAR | Status: DC | PRN

## 2015-04-18 MED ORDER — STERILE WATER FOR IRRIGATION IR SOLN
Status: DC | PRN
  Administered 2015-04-18: 10:00:00

## 2015-04-18 MED ORDER — SODIUM CHLORIDE 0.9 % IV SOLN: INTRAVENOUS | Status: DC

## 2015-04-18 MED ORDER — ACETAMINOPHEN 160 MG/5ML PO SOLN
325.0000 mg | ORAL | Status: DC | PRN
Start: 2015-04-18 — End: 2015-04-18

## 2015-04-18 MED ORDER — OXYCODONE HCL 5 MG PO TABS: 5.0000 mg | ORAL_TABLET | Freq: Once | ORAL | Status: DC | PRN

## 2015-04-18 MED ORDER — LIDOCAINE HCL (CARDIAC) 20 MG/ML IV SOLN
INTRAVENOUS | Status: DC | PRN
  Administered 2015-04-18: 50 mg via INTRAVENOUS

## 2015-04-18 MED ORDER — FENTANYL CITRATE (PF) 100 MCG/2ML IJ SOLN: 25.0000 ug | INTRAMUSCULAR | Status: DC | PRN

## 2015-04-18 SURGICAL SUPPLY — 27 items
CANISTER SUCT 1200ML W/VALVE (MISCELLANEOUS) ×3 IMPLANT
FCP ESCP3.2XJMB 240X2.8X (MISCELLANEOUS)
FORCEPS BIOP RAD 4 LRG CAP 4 (CUTTING FORCEPS) ×3 IMPLANT
FORCEPS BIOP RJ4 240 W/NDL (MISCELLANEOUS)
FORCEPS ESCP3.2XJMB 240X2.8X (MISCELLANEOUS) IMPLANT
GOWN CVR UNV OPN BCK APRN NK (MISCELLANEOUS) ×4 IMPLANT
GOWN ISOL THUMB LOOP REG UNIV (MISCELLANEOUS) ×2
HEMOCLIP INSTINCT (CLIP) IMPLANT
INJECTOR VARIJECT VIN23 (MISCELLANEOUS) IMPLANT
KIT CO2 TUBING (TUBING) ×3 IMPLANT
KIT DEFENDO VALVE AND CONN (KITS) IMPLANT
KIT ENDO PROCEDURE OLY (KITS) ×3 IMPLANT
LIGATOR MULTIBAND 6SHOOTER MBL (MISCELLANEOUS) IMPLANT
MARKER SPOT ENDO TATTOO 5ML (MISCELLANEOUS) IMPLANT
PAD GROUND ADULT SPLIT (MISCELLANEOUS) IMPLANT
SNARE SHORT THROW 13M SML OVAL (MISCELLANEOUS) IMPLANT
SNARE SHORT THROW 30M LRG OVAL (MISCELLANEOUS) IMPLANT
SPOT EX ENDOSCOPIC TATTOO (MISCELLANEOUS)
SUCTION POLY TRAP 4CHAMBER (MISCELLANEOUS) IMPLANT
TRAP SUCTION POLY (MISCELLANEOUS) IMPLANT
TUBING CONN 6MMX3.1M (TUBING)
TUBING SUCTION CONN 0.25 STRL (TUBING) IMPLANT
UNDERPAD 30X60 958B10 (PK) (MISCELLANEOUS) IMPLANT
VALVE BIOPSY ENDO (VALVE) IMPLANT
VARIJECT INJECTOR VIN23 (MISCELLANEOUS)
WATER AUXILLARY (MISCELLANEOUS) IMPLANT
WATER STERILE IRR 500ML POUR (IV SOLUTION) ×3 IMPLANT

## 2015-04-18 NOTE — H&P (Signed)
[name]  [address], [name],  [address] Phone: [phone] Fax : [phone]  Primary Care Physician:  Lindsay Mast, MD Primary Gastroenterologist:  Dr. Allen [name]  Pre-Procedure History & Physical: HPI:  Lindsay [name] is a 74 y.o. female is here for an colonoscopy.   Past Medical History  Diagnosis Date  . Hypothyroidism   . Depression   . Rectal bleeding   . Anemia   . Diverticulosis   . Hematochezia   . Anxiety   . Anemia   . Arthritis     osteo, back  . SOB (shortness of breath)   . Hypertension     controlled on meds    Past Surgical History  Procedure Laterality Date  . Abdominal hysterectomy    . Foot surgery    . Colonoscopy  12/29/08  . Wrist surgery    . Cardiac catheterization  1995    normal, Dr Lindsay [name]    Prior to Admission medications   Medication Sig Start Date End Date Taking? Authorizing Provider  ALPRAZolam (XANAX) 0.25 MG tablet Take 1 tablet (0.25 mg total) by mouth 2 (two) times daily as needed for anxiety. 03/18/15  Yes Lindsay Confer, MD  vitamin B-12 (CYANOCOBALAMIN) 1000 MCG tablet Take 1,000 mcg by mouth daily. am   Yes Historical Provider, MD  bisoprolol-hydrochlorothiazide [facility]) 10-6.25 MG per tablet TAKE ONE (1) TABLET BY MOUTH EVERY DAY 01/12/15   Lindsay Confer, MD  levothyroxine (SYNTHROID, LEVOTHROID) 50 MCG tablet TAKE ONE (1) TABLET BY MOUTH EVERY DAY 07/14/14   Lindsay Confer, MD  losartan (COZAAR) 50 MG tablet TAKE ONE (1) TABLET BY MOUTH EVERY DAY 01/12/15   Lindsay Confer, MD  meloxicam (MOBIC) 15 MG tablet Take 1 tablet (15 mg total) by mouth daily. 07/07/14   Lindsay Confer, MD  Multiple Vitamins-Minerals [facility] COMPLETE PO) Take 1 tablet by mouth. am    Historical Provider, MD    Allergies as of 04/08/2015  . (No Known Allergies)    Family History  Problem Relation Age of Onset  . Diabetes Mother   . Hypertension Mother   . Diabetes Sister   . Hypertension Sister      History   Social History  . Marital Status: Married    Spouse Name: N/A  . Number of Children: N/A  . Years of Education: N/A   Occupational History  . Not on file.   Social History Main Topics  . Smoking status: Current Every Day Smoker -- 1.00 packs/day for 30 years  . Smokeless tobacco: Never Used  . Alcohol Use: Yes     Comment: occassionally/ whiskey on weekend  . Drug Use: No  . Sexual Activity: Not on file   Other Topics Concern  . Not on file   Social History Narrative    Review of Systems: See HPI, otherwise negative ROS  Physical Exam: Ht 5\' 5"  (1.651 m)  Wt 160 lb (72.576 kg)  BMI 26.63 kg/m2 General:   Alert,  pleasant and cooperative in NAD Head:  Normocephalic and atraumatic. Neck:  Supple; no masses or thyromegaly. Lungs:  Clear throughout to auscultation.    Heart:  Regular rate and rhythm. Abdomen:  Soft, nontender and nondistended. Normal bowel sounds, without guarding, and without rebound.   Neurologic:  Alert and  oriented x4;  grossly normal neurologically.  Impression/Plan: Lindsay [name] is here for an colonoscopy to be performed for Anemia and rectal bleeding  Risks, benefits, limitations,  and alternatives regarding  colonoscopy have been reviewed with the patient.  Questions have been answered.  All parties agreeable.   [facility], MD  04/18/2015, 9:03 AM

## 2015-04-18 NOTE — Anesthesia Preprocedure Evaluation (Signed)
Anesthesia Evaluation  Patient identified by MRN, date of birth, ID band Patient awake    Reviewed: Allergy & Precautions, H&P , NPO status , Patient's Chart, lab work & pertinent test results, reviewed documented beta blocker date and time   Airway Mallampati: II  TM Distance: >3 FB Neck ROM: full    Dental no notable dental hx.    Pulmonary Current Smoker,  breath sounds clear to auscultation  Pulmonary exam normal       Cardiovascular Exercise Tolerance: Good hypertension, negative cardio ROS  Rhythm:regular Rate:Normal     Neuro/Psych PSYCHIATRIC DISORDERS negative neurological ROS     GI/Hepatic negative GI ROS, Neg liver ROS,   Endo/Other  negative endocrine ROSHypothyroidism   Renal/GU   negative genitourinary   Musculoskeletal   Abdominal   Peds  Hematology  (+) anemia ,   Anesthesia Other Findings   Reproductive/Obstetrics negative OB ROS                             Anesthesia Physical Anesthesia Plan  ASA: III  Anesthesia Plan: General   Post-op Pain Management:    Induction:   Airway Management Planned:   Additional Equipment:   Intra-op Plan:   Post-operative Plan:   Informed Consent: I have reviewed the patients History and Physical, chart, labs and discussed the procedure including the risks, benefits and alternatives for the proposed anesthesia with the patient or authorized representative who has indicated his/her understanding and acceptance.   Dental Advisory Given  Plan Discussed with: CRNA  Anesthesia Plan Comments:         Anesthesia Quick Evaluation

## 2015-04-18 NOTE — Anesthesia Postprocedure Evaluation (Signed)
  Anesthesia Post-op Note  Patient: Lindsay [name]  Procedure(s) Performed: Procedure(s) with comments: COLONOSCOPY (N/A) - cecum time- 0957 POLYPECTOMY INTESTINAL  Anesthesia type:General  Patient location: PACU  Post pain: Pain level controlled  Post assessment: Post-op Vital signs reviewed, Patient's Cardiovascular Status Stable, Respiratory Function Stable, Patent Airway and No signs of Nausea or vomiting  Post vital signs: Reviewed and stable  Last Vitals:  Filed Vitals:   04/18/15 1008  BP: 108/55  Pulse: 74  Temp: 36.6 C  Resp: 19    Level of consciousness: awake, alert  and patient cooperative  Complications: No apparent anesthesia complications

## 2015-04-18 NOTE — Op Note (Signed)
[facility] Patient Name: Lindsay [name] Procedure Date: 04/18/2015 9:37 AM MRN: [id number] Account #: [id number] Date of Birth: [birth year] Admit Type: Outpatient Age: 74 Room: [facility] OR ROOM 01 Gender: Female Note Status: Finalized Procedure:         Colonoscopy Indications:       Hematochezia, Iron deficiency anemia Providers:         Lucilla Lame, MD Referring MD:      Eduard Clos. Gilford Rile, MD (Referring MD) Medicines:         Propofol per Anesthesia Complications:     No immediate complications. Procedure:         Pre-Anesthesia Assessment:                    - Prior to the procedure, a History and Physical was                     performed, and patient medications and allergies were                     reviewed. The patient's tolerance of previous anesthesia                     was also reviewed. The risks and benefits of the procedure                     and the sedation options and risks were discussed with the                     patient. All questions were answered, and informed consent                     was obtained. Prior Anticoagulants: The patient has taken                     no previous anticoagulant or antiplatelet agents. ASA                     Grade Assessment: II - A patient with mild systemic                     disease. After reviewing the risks and benefits, the                     patient was deemed in satisfactory condition to undergo                     the procedure.                    After obtaining informed consent, the colonoscope was                     passed under direct vision. Throughout the procedure, the                     patient's blood pressure, pulse, and oxygen saturations                     were monitored continuously. The Olympus CF-HQ190L                     Colonoscope (S#. [id number]) was introduced through the anus  and advanced to the the cecum, identified by appendiceal          orifice and ileocecal valve. The colonoscopy was performed                     without difficulty. The patient tolerated the procedure                     well. The quality of the bowel preparation was excellent. Findings:      The perianal and digital rectal examinations were normal.      Four sessile polyps were found in the sigmoid colon. The polyps were 2       to 5 mm in size. These polyps were removed with a cold biopsy forceps.       Resection and retrieval were complete.      Multiple small-mouthed diverticula were found in the entire colon.      Non-bleeding internal hemorrhoids were found during retroflexion. The       hemorrhoids were Grade I (internal hemorrhoids that do not prolapse). Impression:        - Four 2 to 5 mm polyps in the sigmoid colon. Resected and                     retrieved.                    - Diverticulosis in the entire examined colon.                    - Non-bleeding internal hemorrhoids. Recommendation:    - Await pathology results.                    - No source of anemia seen. The rectal bleedign is likely                     hemorrhoidal. Procedure Code(s): --- Professional ---                    [phone], Colonoscopy, flexible; with biopsy, single or                     multiple Diagnosis Code(s): --- Professional ---                    K92.1, Melena                    D12.5, Benign neoplasm of sigmoid colon                    D50.9, Iron deficiency anemia, unspecified CPT copyright 2014 American Medical Association. All rights reserved. The codes documented in this report are preliminary and upon coder review may  be revised to meet current compliance requirements. Lucilla Lame, MD 04/18/2015 10:09:17 AM This report has been signed electronically. Number of Addenda: 0 Note Initiated On: 04/18/2015 9:37 AM Scope Withdrawal Time: 0 hours 8 minutes 33 seconds  Total Procedure Duration: 0 hours 13 minutes 23 seconds       [facility]

## 2015-04-18 NOTE — Anesthesia Procedure Notes (Signed)
Procedure Name: MAC Performed by: Floree Zuniga Pre-anesthesia Checklist: Patient identified, Emergency Drugs available, Suction available, Timeout performed and Patient being monitored Patient Re-evaluated:Patient Re-evaluated prior to inductionOxygen Delivery Method: Nasal cannula Placement Confirmation: positive ETCO2     

## 2015-04-18 NOTE — Transfer of Care (Signed)
Immediate Anesthesia Transfer of Care Note  Patient: Lindsay [name]  Procedure(s) Performed: Procedure(s) with comments: COLONOSCOPY (N/A) - cecum time- 0957  Patient Location: PACU  Anesthesia Type: General  Level of Consciousness: awake, alert  and patient cooperative  Airway and Oxygen Therapy: Patient Spontanous Breathing and Patient connected to supplemental oxygen  Post-op Assessment: Post-op Vital signs reviewed, Patient's Cardiovascular Status Stable, Respiratory Function Stable, Patent Airway and No signs of Nausea or vomiting  Post-op Vital Signs: Reviewed and stable  Complications: No apparent anesthesia complications

## 2015-04-20 ENCOUNTER — Telehealth: Payer: Self-pay | Admitting: *Deleted

## 2015-04-20 NOTE — Telephone Encounter (Signed)
4:30 on Tuesday May 31st?

## 2015-04-20 NOTE — Telephone Encounter (Signed)
pt needs f/u appt from Colonoscopy done on Monday. Needs afternoon appt. Pt cant come tomorrow 04/21/15. Please advise where to schedule

## 2015-04-20 NOTE — Telephone Encounter (Signed)
Please see below.

## 2015-04-20 NOTE — Telephone Encounter (Signed)
Pt notified and scheduled.

## 2015-04-26 ENCOUNTER — Encounter: Payer: Self-pay | Admitting: Emergency Medicine

## 2015-04-26 ENCOUNTER — Emergency Department
Admission: EM | Admit: 2015-04-26 | Discharge: 2015-04-27 | Disposition: A | Discharge status: Home / Self Care | Payer: Medicare Other | Attending: Emergency Medicine | Admitting: Emergency Medicine

## 2015-04-26 DIAGNOSIS — Z791 Long term (current) use of non-steroidal anti-inflammatories (NSAID): Secondary | ICD-10-CM | POA: Insufficient documentation

## 2015-04-26 DIAGNOSIS — K648 Other hemorrhoids: Secondary | ICD-10-CM | POA: Diagnosis not present

## 2015-04-26 DIAGNOSIS — I10 Essential (primary) hypertension: Secondary | ICD-10-CM | POA: Insufficient documentation

## 2015-04-26 DIAGNOSIS — Z79899 Other long term (current) drug therapy: Secondary | ICD-10-CM | POA: Diagnosis not present

## 2015-04-26 DIAGNOSIS — Z720 Tobacco use: Secondary | ICD-10-CM | POA: Insufficient documentation

## 2015-04-26 DIAGNOSIS — Z880 Allergy status to penicillin: Secondary | ICD-10-CM | POA: Diagnosis not present

## 2015-04-26 DIAGNOSIS — K625 Hemorrhage of anus and rectum: Secondary | ICD-10-CM | POA: Diagnosis present

## 2015-04-26 LAB — URINALYSIS COMPLETE WITH MICROSCOPIC (ARMC ONLY)
BILIRUBIN URINE: NEGATIVE
Bacteria, UA: NONE SEEN
Glucose, UA: NEGATIVE mg/dL
Hgb urine dipstick: NEGATIVE
Ketones, ur: NEGATIVE mg/dL
Leukocytes, UA: NEGATIVE
Nitrite: NEGATIVE
PROTEIN: NEGATIVE mg/dL
RBC / HPF: NONE SEEN RBC/hpf (ref 0–5)
SPECIFIC GRAVITY, URINE: 1.016 (ref 1.005–1.030)
pH: 5 (ref 5.0–8.0)

## 2015-04-26 LAB — COMPREHENSIVE METABOLIC PANEL
ALBUMIN: 3.9 g/dL (ref 3.5–5.0)
ALK PHOS: 49 U/L (ref 38–126)
ALT: 17 U/L (ref 14–54)
ANION GAP: 7 (ref 5–15)
AST: 21 U/L (ref 15–41)
BILIRUBIN TOTAL: 0.3 mg/dL (ref 0.3–1.2)
BUN: 18 mg/dL (ref 6–20)
CO2: 28 mmol/L (ref 22–32)
CREATININE: 0.78 mg/dL (ref 0.44–1.00)
Calcium: 9 mg/dL (ref 8.9–10.3)
Chloride: 105 mmol/L (ref 101–111)
GFR calc Af Amer: >60 mL/min (ref >60)
GFR calc non Af Amer: >60 mL/min (ref >60)
Glucose, Bld: 112 mg/dL — ABNORMAL HIGH (ref 65–99)
Potassium: 3.4 mmol/L — ABNORMAL LOW (ref 3.5–5.1)
Sodium: 140 mmol/L (ref 135–145)
Total Protein: 7.4 g/dL (ref 6.5–8.1)

## 2015-04-26 LAB — CBC
HCT: 36 % (ref 35.0–47.0)
Hemoglobin: 12 g/dL (ref 12.0–16.0)
MCH: 27.1 pg (ref 26.0–34.0)
MCHC: 33.2 g/dL (ref 32.0–36.0)
MCV: 81.5 fL (ref 80.0–100.0)
Platelets: 231 10*3/uL (ref 150–440)
RBC: 4.42 MIL/uL (ref 3.80–5.20)
RDW: 17 % — ABNORMAL HIGH (ref 11.5–14.5)
WBC: 9.3 10*3/uL (ref 3.6–11.0)

## 2015-04-26 NOTE — ED Provider Notes (Signed)
[facility] Emergency Department Provider Note  ____________________________________________  Time seen: 11:00 PM  I have reviewed the triage vital signs and the nursing notes.   HISTORY  Chief Complaint Rectal Bleeding      HPI Lindsay [name] is a 74 y.o. female presents with painless rectal bleeding noted with wiping after bowel movement. Patient states no abdominal pain no nausea no vomiting and no fever. Of note patient had a history of diverticulitis treated at [facility] last month.     Past Medical History  Diagnosis Date  . Hypothyroidism   . Depression   . Rectal bleeding   . Anemia   . Diverticulosis   . Hematochezia   . Anxiety   . Anemia   . Arthritis     osteo, back  . SOB (shortness of breath)   . Hypertension     controlled on meds    Patient Active Problem List   Diagnosis Date Noted  . Hematochezia 03/18/2015  . Calcific Achilles tendinitis 07/16/2014  . Right leg pain 07/07/2014  . Tobacco use disorder 03/18/2013  . Medicare annual wellness visit, subsequent 09/10/2012  . Anxiety 09/10/2012  . Arthritis 09/10/2012  . Screening for breast cancer 09/10/2012  . Insomnia 02/13/2012  . Hypothyroidism 08/15/2011  . Hypertension 08/15/2011  . Depression 08/15/2011    Past Surgical History  Procedure Laterality Date  . Abdominal hysterectomy    . Foot surgery    . Colonoscopy  12/29/08  . Wrist surgery    . Cardiac catheterization  1995    normal, Dr Clayborn Bigness  . Colonoscopy N/A 04/18/2015    Procedure: COLONOSCOPY;  Surgeon: Lucilla Lame, MD;  Location: [place];  Service: Gastroenterology;  Laterality: N/A;  cecum time- 0957  . Polypectomy  04/18/2015    Procedure: POLYPECTOMY INTESTINAL;  Surgeon: Lucilla Lame, MD;  Location: [place];  Service: Gastroenterology;;    Current Outpatient Rx  Name  Route  Sig  Dispense  Refill  . ALPRAZolam (XANAX) 0.25 MG tablet   Oral   Take 1 tablet  (0.25 mg total) by mouth 2 (two) times daily as needed for anxiety.   60 tablet   0   . bisoprolol-hydrochlorothiazide (ZIAC) 10-6.25 MG per tablet      TAKE ONE (1) TABLET BY MOUTH EVERY DAY   90 tablet   1   . levothyroxine (SYNTHROID, LEVOTHROID) 50 MCG tablet      TAKE ONE (1) TABLET BY MOUTH EVERY DAY   90 tablet   3   . losartan (COZAAR) 50 MG tablet      TAKE ONE (1) TABLET BY MOUTH EVERY DAY   90 tablet   1   . meloxicam (MOBIC) 15 MG tablet   Oral   Take 1 tablet (15 mg total) by mouth daily.   90 tablet   1   . Multiple Vitamins-Minerals (MULTI COMPLETE PO)   Oral   Take 1 tablet by mouth. am         . vitamin B-12 (CYANOCOBALAMIN) 1000 MCG tablet   Oral   Take 1,000 mcg by mouth daily. am           Allergies Penicillins  Family History  Problem Relation Age of Onset  . Diabetes Mother   . Hypertension Mother   . Diabetes Sister   . Hypertension Sister     Social History History  Substance Use Topics  . Smoking status: Current Every Day Smoker --  1.00 packs/day for 30 years  . Smokeless tobacco: Never Used  . Alcohol Use: Yes     Comment: occassionally/ whiskey on weekend    Review of Systems  Constitutional: Negative for fever. Eyes: Negative for visual changes. ENT: Negative for sore throat. Cardiovascular: Negative for chest pain. Respiratory: Negative for shortness of breath. Gastrointestinal: Negative for abdominal pain, vomiting and diarrhea. Genitourinary: Negative for dysuria. Musculoskeletal: Negative for back pain. Skin: Negative for rash. Neurological: Negative for headaches, focal weakness or numbness.   10-point ROS otherwise negative.  ____________________________________________   PHYSICAL EXAM:  VITAL SIGNS: ED Triage Vitals  Enc Vitals Group     BP 04/26/15 2143 190/78 mmHg     Pulse Rate 04/26/15 2143 70     Resp 04/26/15 2143 18     Temp 04/26/15 2143 98.2 F (36.8 C)     Temp Source 04/26/15 2143  Oral     SpO2 04/26/15 2143 100 %     Weight 04/26/15 2143 158 lb (71.668 kg)     Height 04/26/15 2143 5\' 5"  (1.651 m)     Head Cir --      Peak Flow --      Pain Score --      Pain Loc --      Pain Edu? --      Excl. in [place]? --      Constitutional: Alert and oriented. Well appearing and in no distress. Eyes: Conjunctivae are normal. PERRL. Normal extraocular movements. ENT   Head: Normocephalic and atraumatic.   Nose: No congestion/rhinnorhea.   Mouth/Throat: Mucous membranes are moist.   Neck: No stridor. Cardiovascular: Normal rate, regular rhythm. Normal and symmetric distal pulses are present in all extremities. No murmurs, rubs, or gallops. Respiratory: Normal respiratory effort without tachypnea nor retractions. Breath sounds are clear and equal bilaterally. No wheezes/rales/rhonchi. Gastrointestinal: Soft and nontender. No distention. There is no CVA tenderness. Guaiac negative Genitourinary: deferred Musculoskeletal: Nontender with normal range of motion in all extremities. No joint effusions.  No lower extremity tenderness nor edema. Neurologic:  Normal speech and language. No gross focal neurologic deficits are appreciated. Speech is normal.  Skin:  Skin is warm, dry and intact. No rash noted. Psychiatric: Mood and affect are normal. Speech and behavior are normal. Patient exhibits appropriate insight and judgment.  ____________________________________________    LABS (pertinent positives/negatives)  Labs Reviewed  URINALYSIS COMPLETEWITH MICROSCOPIC ([place])  - Abnormal; Notable for the following:    Color, Urine YELLOW (*)    APPearance CLEAR (*)    Squamous Epithelial / LPF 0-5 (*)    All other components within normal limits  CBC - Abnormal; Notable for the following:    RDW 17.0 (*)    All other components within normal limits  COMPREHENSIVE METABOLIC PANEL - Abnormal; Notable for the following:    Potassium 3.4 (*)    Glucose, Bld 112 (*)     All other components within normal limits         INITIAL IMPRESSION / ASSESSMENT AND PLAN / ED COURSE  Pertinent labs & imaging results that were available during my care of the patient were reviewed by me and considered in my medical decision making (see chart for details).  History of physical exam consistent with possible hemorrhoid bleeding. Colonoscopy performed by Dr. wall last month revealed internal hemorrhoid as well as diverticulitis. Considered possibly of diverticulitis as etiology of the patient's rectal bleeding however given absence of abdominal pain and guaiac-negative most likely  etiology is bleeding from internal hemorrhoid given only noted with wiping.  ____________________________________________   FINAL CLINICAL IMPRESSION(S) / ED DIAGNOSES  Final diagnoses:  Internal hemorrhoids without complication      Gregor Hams, MD 04/27/15 2321

## 2015-04-26 NOTE — ED Notes (Signed)
MD Owens Shark at bedside, completing ER medical assessment. Patient given opportunity to ask questions and concerns.

## 2015-04-26 NOTE — ED Notes (Signed)
Started having bloody stools today was admitted last month for the same and was dx with diverticulitis.

## 2015-04-26 NOTE — Discharge Instructions (Signed)

## 2015-04-27 NOTE — ED Notes (Signed)
Patient with no complaints at this time. Respirations even and unlabored. Skin warm/dry. Discharge instructions reviewed with patient at this time. Patient given opportunity to voice concerns/ask questions. IV removed per policy and band-aid applied to site. Patient discharged at this time and left Emergency Department with steady gait.  

## 2015-05-03 ENCOUNTER — Ambulatory Visit (INDEPENDENT_AMBULATORY_CARE_PROVIDER_SITE_OTHER): Payer: Medicare Other | Admitting: Internal Medicine

## 2015-05-03 ENCOUNTER — Encounter: Payer: Self-pay | Admitting: Internal Medicine

## 2015-05-03 VITALS — BP 146/72 | HR 65 | Temp 98.2°F | Ht 65.0 in | Wt 161.1 lb

## 2015-05-03 DIAGNOSIS — K921 Melena: Secondary | ICD-10-CM

## 2015-05-03 DIAGNOSIS — I10 Essential (primary) hypertension: Secondary | ICD-10-CM

## 2015-05-03 NOTE — Patient Instructions (Signed)
We will set up evaluation with Dr. Allen Norris in GI to discuss treatment of internal hemorrhoids.

## 2015-05-03 NOTE — Assessment & Plan Note (Signed)
BP Readings from Last 3 Encounters:  05/03/15 146/72  04/27/15 152/88  04/18/15 106/52   BP generally well controlled. Continue Losartan and Bisoprolol-HCTZ.

## 2015-05-03 NOTE — Progress Notes (Signed)
Subjective:    Patient ID: Lindsay [name], female    DOB: Jan 17, 1941, 74 y.o.   MRN: [id number]  HPI  74YO female presents for follow up.  Seen in the ED 5/24 for small amount of rectal bleeding. Went to ED, however no intervention made. Blood counts were normal.  No bleeding since that time. Now having regular BMs. No black or bloody stool. No abdominal or rectal pain. She notes some occasional constipation, and is using Colace and Miralax however not on a regular basis.  Past medical, surgical, family and social history per today's encounter.  Review of Systems  Constitutional: Negative for fever, chills, appetite change, fatigue and unexpected weight change.  Eyes: Negative for visual disturbance.  Respiratory: Negative for shortness of breath.   Cardiovascular: Negative for chest pain and leg swelling.  Gastrointestinal: Positive for constipation and anal bleeding. Negative for nausea, vomiting, abdominal pain, diarrhea, blood in stool, abdominal distention and rectal pain.  Skin: Negative for color change and rash.  Hematological: Negative for adenopathy. Does not bruise/bleed easily.  Psychiatric/Behavioral: Negative for dysphoric mood. The patient is not nervous/anxious.        Objective:    BP 146/72 mmHg  Pulse 65  Temp(Src) 98.2 F (36.8 C) (Oral)  Ht 5\' 5"  (1.651 m)  Wt 161 lb 2 oz (73.086 kg)  BMI 26.81 kg/m2  SpO2 99% Physical Exam  Constitutional: She is oriented to person, place, and time. She appears well-developed and well-nourished. No distress.  HENT:  Head: Normocephalic and atraumatic.  Right Ear: External ear normal.  Left Ear: External ear normal.  Nose: Nose normal.  Mouth/Throat: Oropharynx is clear and moist. No oropharyngeal exudate.  Eyes: Conjunctivae are normal. Pupils are equal, round, and reactive to light. Right eye exhibits no discharge. Left eye exhibits no discharge. No scleral icterus.  Neck: Normal range of motion. Neck supple.  No tracheal deviation present. No thyromegaly present.  Cardiovascular: Normal rate, regular rhythm, normal heart sounds and intact distal pulses.  Exam reveals no gallop and no friction rub.   No murmur heard. Pulmonary/Chest: Effort normal and breath sounds normal. No respiratory distress. She has no wheezes. She has no rales. She exhibits no tenderness.  Musculoskeletal: Normal range of motion. She exhibits no edema or tenderness.  Lymphadenopathy:    She has no cervical adenopathy.  Neurological: She is alert and oriented to person, place, and time. No cranial nerve deficit. She exhibits normal muscle tone. Coordination normal.  Skin: Skin is warm and dry. No rash noted. She is not diaphoretic. No erythema. No pallor.  Psychiatric: She has a normal mood and affect. Her behavior is normal. Judgment and thought content normal.          Assessment & Plan:   Problem List Items Addressed This Visit      Unprioritized   Hematochezia - Primary    Hematochezia has resolved. Likely related to internal hemorrhoids seen on colonoscopy. Will set up evaluation with Dr. Allen Norris. Question if she may need banding of hemorrhoids ultimately given recurrent bleeding. Encouraged her to use Colace or Miralax daily to help prevent constipation which may be leading to hemorrhoids.      Relevant Orders   Ambulatory referral to Gastroenterology   Hypertension    BP Readings from Last 3 Encounters:  05/03/15 146/72  04/27/15 152/88  04/18/15 106/52   BP generally well controlled. Continue Losartan and Bisoprolol-HCTZ.          Return in about  4 weeks (around 05/31/2015) for Recheck.

## 2015-05-03 NOTE — Progress Notes (Signed)
Pre visit review using our clinic review tool, if applicable. No additional management support is needed unless otherwise documented below in the visit note. 

## 2015-05-03 NOTE — Assessment & Plan Note (Addendum)
Hematochezia has resolved. Likely related to internal hemorrhoids seen on colonoscopy. Will set up evaluation with Dr. Allen Norris. Question if she may need banding of hemorrhoids ultimately given recurrent bleeding. Encouraged her to use Colace or Miralax daily to help prevent constipation which may be leading to hemorrhoids.

## 2015-05-16 ENCOUNTER — Telehealth: Payer: Self-pay | Admitting: Gastroenterology

## 2015-05-17 NOTE — Telephone Encounter (Signed)
Appt made with Dr Allen Norris on 05/31/15. Pt confirmed.

## 2015-05-26 ENCOUNTER — Other Ambulatory Visit: Payer: Self-pay

## 2015-05-31 ENCOUNTER — Ambulatory Visit: Payer: Self-pay | Admitting: Gastroenterology

## 2015-06-24 ENCOUNTER — Other Ambulatory Visit: Payer: Self-pay

## 2015-06-24 MED ORDER — PANTOPRAZOLE SODIUM 40 MG PO TBEC: 40.0000 mg | DELAYED_RELEASE_TABLET | Freq: Every day | ORAL | Status: DC

## 2015-06-25 ENCOUNTER — Encounter: Payer: Self-pay | Admitting: Emergency Medicine

## 2015-06-25 ENCOUNTER — Observation Stay
Admission: EM | Admit: 2015-06-25 | Discharge: 2015-06-26 | Disposition: A | Discharge status: Home / Self Care | Payer: Medicare Other | Attending: Internal Medicine | Admitting: Internal Medicine

## 2015-06-25 DIAGNOSIS — Z8249 Family history of ischemic heart disease and other diseases of the circulatory system: Secondary | ICD-10-CM | POA: Diagnosis not present

## 2015-06-25 DIAGNOSIS — K625 Hemorrhage of anus and rectum: Secondary | ICD-10-CM

## 2015-06-25 DIAGNOSIS — K922 Gastrointestinal hemorrhage, unspecified: Principal | ICD-10-CM

## 2015-06-25 DIAGNOSIS — Z79899 Other long term (current) drug therapy: Secondary | ICD-10-CM | POA: Diagnosis not present

## 2015-06-25 DIAGNOSIS — Z833 Family history of diabetes mellitus: Secondary | ICD-10-CM | POA: Diagnosis not present

## 2015-06-25 DIAGNOSIS — I10 Essential (primary) hypertension: Secondary | ICD-10-CM | POA: Insufficient documentation

## 2015-06-25 DIAGNOSIS — G4700 Insomnia, unspecified: Secondary | ICD-10-CM | POA: Diagnosis not present

## 2015-06-25 DIAGNOSIS — M79604 Pain in right leg: Secondary | ICD-10-CM | POA: Insufficient documentation

## 2015-06-25 DIAGNOSIS — F329 Major depressive disorder, single episode, unspecified: Secondary | ICD-10-CM | POA: Diagnosis not present

## 2015-06-25 DIAGNOSIS — F419 Anxiety disorder, unspecified: Secondary | ICD-10-CM | POA: Diagnosis not present

## 2015-06-25 DIAGNOSIS — R0602 Shortness of breath: Secondary | ICD-10-CM | POA: Diagnosis not present

## 2015-06-25 DIAGNOSIS — K5790 Diverticulosis of intestine, part unspecified, without perforation or abscess without bleeding: Secondary | ICD-10-CM | POA: Insufficient documentation

## 2015-06-25 DIAGNOSIS — Z90710 Acquired absence of both cervix and uterus: Secondary | ICD-10-CM | POA: Insufficient documentation

## 2015-06-25 DIAGNOSIS — M7660 Achilles tendinitis, unspecified leg: Secondary | ICD-10-CM | POA: Diagnosis not present

## 2015-06-25 DIAGNOSIS — Z880 Allergy status to penicillin: Secondary | ICD-10-CM | POA: Insufficient documentation

## 2015-06-25 DIAGNOSIS — F17200 Nicotine dependence, unspecified, uncomplicated: Secondary | ICD-10-CM | POA: Insufficient documentation

## 2015-06-25 DIAGNOSIS — E039 Hypothyroidism, unspecified: Secondary | ICD-10-CM | POA: Diagnosis not present

## 2015-06-25 DIAGNOSIS — M479 Spondylosis, unspecified: Secondary | ICD-10-CM | POA: Diagnosis not present

## 2015-06-25 LAB — COMPREHENSIVE METABOLIC PANEL
ALBUMIN: 4.1 g/dL (ref 3.5–5.0)
ALT: 17 U/L (ref 14–54)
AST: 20 U/L (ref 15–41)
Alkaline Phosphatase: 57 U/L (ref 38–126)
Anion gap: 7 (ref 5–15)
BUN: 17 mg/dL (ref 6–20)
CALCIUM: 9 mg/dL (ref 8.9–10.3)
CO2: 29 mmol/L (ref 22–32)
Chloride: 107 mmol/L (ref 101–111)
Creatinine, Ser: 0.68 mg/dL (ref 0.44–1.00)
GFR calc non Af Amer: >60 mL/min (ref >60)
Glucose, Bld: 135 mg/dL — ABNORMAL HIGH (ref 65–99)
Potassium: 4.4 mmol/L (ref 3.5–5.1)
Sodium: 143 mmol/L (ref 135–145)
TOTAL PROTEIN: 7.7 g/dL (ref 6.5–8.1)
Total Bilirubin: 0.3 mg/dL (ref 0.3–1.2)

## 2015-06-25 LAB — CBC
HCT: 39.8 % (ref 35.0–47.0)
Hemoglobin: 13.3 g/dL (ref 12.0–16.0)
MCH: 25.3 pg — ABNORMAL LOW (ref 26.0–34.0)
MCHC: 33.5 g/dL (ref 32.0–36.0)
MCV: 75.4 fL — AB (ref 80.0–100.0)
PLATELETS: 165 10*3/uL (ref 150–440)
RBC: 5.28 MIL/uL — ABNORMAL HIGH (ref 3.80–5.20)
RDW: 20.1 % — ABNORMAL HIGH (ref 11.5–14.5)
WBC: 6.4 10*3/uL (ref 3.6–11.0)

## 2015-06-25 LAB — HEMOGLOBIN
HEMOGLOBIN: 13.2 g/dL (ref 12.0–16.0)
Hemoglobin: 12.6 g/dL (ref 12.0–16.0)

## 2015-06-25 LAB — ABO/RH: ABO/RH(D): A POS

## 2015-06-25 LAB — TYPE AND SCREEN
ABO/RH(D): A POS
ANTIBODY SCREEN: NEGATIVE

## 2015-06-25 MED ORDER — METOPROLOL TARTRATE 1 MG/ML IV SOLN
5.0000 mg | Freq: Four times a day (QID) | INTRAVENOUS | Status: DC | PRN
  Filled 2015-06-25: qty 5

## 2015-06-25 MED ORDER — VITAMIN B-12 1000 MCG PO TABS
1000.0000 ug | ORAL_TABLET | Freq: Every day | ORAL | Status: DC
  Administered 2015-06-25: 1000 ug via ORAL
  Filled 2015-06-25: qty 1

## 2015-06-25 MED ORDER — BISOPROLOL-HYDROCHLOROTHIAZIDE 10-6.25 MG PO TABS
1.0000 | ORAL_TABLET | Freq: Every day | ORAL | Status: DC
  Administered 2015-06-25: 1 via ORAL
  Filled 2015-06-25: qty 1

## 2015-06-25 MED ORDER — ADULT MULTIVITAMIN W/MINERALS CH
1.0000 | ORAL_TABLET | Freq: Every day | ORAL | Status: DC
  Administered 2015-06-25: 1 via ORAL
  Filled 2015-06-25: qty 1

## 2015-06-25 MED ORDER — ALPRAZOLAM 0.25 MG PO TABS: 0.2500 mg | ORAL_TABLET | Freq: Two times a day (BID) | ORAL | Status: DC | PRN

## 2015-06-25 MED ORDER — LEVOTHYROXINE SODIUM 50 MCG PO TABS
50.0000 ug | ORAL_TABLET | Freq: Every day | ORAL | Status: DC
  Administered 2015-06-26: 50 ug via ORAL
  Filled 2015-06-25: qty 1

## 2015-06-25 MED ORDER — LOSARTAN POTASSIUM 50 MG PO TABS
25.0000 mg | ORAL_TABLET | Freq: Every day | ORAL | Status: DC
  Administered 2015-06-25: 25 mg via ORAL
  Filled 2015-06-25: qty 1

## 2015-06-25 MED ORDER — BISOPROLOL FUMARATE 10 MG PO TABS: 10.0000 mg | ORAL_TABLET | Freq: Every day | ORAL | Status: DC

## 2015-06-25 MED ORDER — PANTOPRAZOLE SODIUM 40 MG PO TBEC
40.0000 mg | DELAYED_RELEASE_TABLET | Freq: Every day | ORAL | Status: DC
  Administered 2015-06-26: 40 mg via ORAL
  Filled 2015-06-25: qty 1

## 2015-06-25 MED ORDER — METOPROLOL TARTRATE 1 MG/ML IV SOLN
5.0000 mg | Freq: Once | INTRAVENOUS | Status: AC
  Administered 2015-06-25: 23:00:00 5 mg via INTRAVENOUS
  Filled 2015-06-25: qty 5

## 2015-06-25 MED ORDER — HYDROCHLOROTHIAZIDE 12.5 MG PO CAPS
12.5000 mg | ORAL_CAPSULE | Freq: Every day | ORAL | Status: DC
Start: 2015-06-25 — End: 2015-06-25

## 2015-06-25 NOTE — Progress Notes (Signed)
Dr. Margaretmary Eddy notified patient has high blood pressure, Cozaar and Ziac given at 1619. Give Lopressor 5 mg IV push once, order PRN Lopressor 5 mg q 6 hours PRN for systolic BP > 435.

## 2015-06-25 NOTE — ED Notes (Signed)
Pt reports bleeding in rectum that she notice this morning. Pt states the blood was a lot to her but it did not fill the toilet. Also states her stool was a dark color. Denies use of blood thinners.

## 2015-06-25 NOTE — Plan of Care (Signed)
Pt likes to go by [name]. Pt lives at home by herself.  Plan of care, progress to goal:  GI Bleed: Pt admitted from the ED with stable hemoglobin level, observation status. Tolerating Clear liquid diet No pain upon admission

## 2015-06-25 NOTE — Plan of Care (Signed)
Problem: Discharge Progression Outcomes Goal: Other Discharge Outcomes/Goals Outcome: Progressing Patient admitted for observation due to possible GI bleed Patient had no c/o pain  Patient had no reports of bloody stools during shift BP elevated but coming down as patient had no taken BP medicine in am at home Patient is on a clear liquid diet and is tolerating diet well

## 2015-06-25 NOTE — H&P (Signed)
[place] [facility] [place] NAME: Lindsay [name]    MR#:  [id number]  DATE OF BIRTH:  1941-07-19  DATE OF ADMISSION:  06/25/2015  PRIMARY CARE PHYSICIAN: Rica Mast, MD   REQUESTING/REFERRING PHYSICIAN: Lisa Roca  CHIEF COMPLAINT:   Chief Complaint  Patient presents with  . Rectal Bleeding    HISTORY OF PRESENT ILLNESS: Lindsay [name]  is a 74 y.o. female with a known history of hypertension, hypothyroidism, anemia, GI bleed- colonoscopy 2 months ago by Dr. Allen Norris- showed internal hemorrhoid and a few polyps- was not having any bleed in last 2 months. For last 2 days she had some gas and bloating feelings, today morning she had episode of bright red blood per rectum, followed by some stool mixed with blood. She denies any abdominal pain, fever, nausea or vomiting. In ER- hemoglobin and blood pressure are stable, she is given as admission for observation.  PAST MEDICAL HISTORY:   Past Medical History  Diagnosis Date  . Hypothyroidism   . Depression   . Rectal bleeding   . Anemia   . Diverticulosis   . Hematochezia   . Anxiety   . Anemia   . Arthritis     osteo, back  . SOB (shortness of breath)   . Hypertension     controlled on meds    PAST SURGICAL HISTORY:  Past Surgical History  Procedure Laterality Date  . Abdominal hysterectomy    . Foot surgery    . Colonoscopy  12/29/08  . Wrist surgery    . Cardiac catheterization  1995    normal, Dr Clayborn Bigness  . Colonoscopy N/A 04/18/2015    Procedure: COLONOSCOPY;  Surgeon: Lucilla Lame, MD;  Location: [place];  Service: Gastroenterology;  Laterality: N/A;  cecum time- 0957  . Polypectomy  04/18/2015    Procedure: POLYPECTOMY INTESTINAL;  Surgeon: Lucilla Lame, MD;  Location: [place];  Service: Gastroenterology;;    SOCIAL HISTORY:  History  Substance Use Topics  . Smoking status: Current Every Day Smoker -- 1.00 packs/day for 30 years  .  Smokeless tobacco: Never Used  . Alcohol Use: Yes     Comment: occassionally/ whiskey on weekend    FAMILY HISTORY:  Family History  Problem Relation Age of Onset  . Diabetes Mother   . Hypertension Mother   . Diabetes Sister   . Hypertension Sister     DRUG ALLERGIES:  Allergies  Allergen Reactions  . Penicillins Itching and Rash    REVIEW OF SYSTEMS:   CONSTITUTIONAL: No fever, fatigue or weakness.  EYES: No blurred or double vision.  EARS, NOSE, AND THROAT: No tinnitus or ear pain.  RESPIRATORY: No cough, shortness of breath, wheezing or hemoptysis.  CARDIOVASCULAR: No chest pain, orthopnea, edema.  GASTROINTESTINAL: No nausea, vomiting, diarrhea or abdominal pain. Had blood in stool. GENITOURINARY: No dysuria, hematuria.  ENDOCRINE: No polyuria, nocturia,  HEMATOLOGY: No anemia, easy bruising or bleeding SKIN: No rash or lesion. MUSCULOSKELETAL: No joint pain or arthritis.   NEUROLOGIC: No tingling, numbness, weakness.  PSYCHIATRY: No anxiety or depression.   MEDICATIONS AT HOME:  Prior to Admission medications   Medication Sig Start Date End Date Taking? Authorizing Provider  ALPRAZolam (XANAX) 0.25 MG tablet Take 1 tablet (0.25 mg total) by mouth 2 (two) times daily as needed for anxiety. 03/18/15  Yes Jackolyn Confer, MD  bisoprolol-hydrochlorothiazide (ZIAC) 10-6.25 MG per tablet TAKE ONE (1) TABLET BY MOUTH EVERY DAY 01/12/15  Yes Jackolyn Confer, MD  levothyroxine (SYNTHROID, LEVOTHROID) 50 MCG tablet TAKE ONE (1) TABLET BY MOUTH EVERY DAY 07/14/14  Yes Jackolyn Confer, MD  losartan (COZAAR) 50 MG tablet TAKE ONE (1) TABLET BY MOUTH EVERY DAY 01/12/15  Yes Jackolyn Confer, MD  meloxicam (MOBIC) 15 MG tablet Take 1 tablet (15 mg total) by mouth daily. 07/07/14  Yes Jackolyn Confer, MD  Multiple Vitamins-Minerals [facility] COMPLETE PO) Take 1 tablet by mouth. am   Yes Historical Provider, MD  pantoprazole (PROTONIX) 40 MG tablet Take 1 tablet (40 mg total) by  mouth daily. 06/24/15  Yes Jackolyn Confer, MD  vitamin B-12 (CYANOCOBALAMIN) 1000 MCG tablet Take 1,000 mcg by mouth daily. am   Yes Historical Provider, MD      PHYSICAL EXAMINATION:   VITAL SIGNS: Blood pressure 173/55, pulse 68, temperature 98.1 F (36.7 C), temperature source Oral, resp. rate 18, height 5\' 6"  (1.676 m), weight 74.844 kg (165 lb), SpO2 99 %.  GENERAL:  74 y.o.-year-old patient lying in the bed with no acute distress.  EYES: Pupils equal, round, reactive to light and accommodation. No scleral icterus. Extraocular muscles intact.  HEENT: Head atraumatic, normocephalic. Oropharynx and nasopharynx clear.  NECK:  Supple, no jugular venous distention. No thyroid enlargement, no tenderness.  LUNGS: Normal breath sounds bilaterally, no wheezing, rales,rhonchi or crepitation. No use of accessory muscles of respiration.  CARDIOVASCULAR: S1, S2 normal. No murmurs, rubs, or gallops.  ABDOMEN: Soft, nontender, nondistended. Bowel sounds present. No organomegaly or mass.  EXTREMITIES: No pedal edema, cyanosis, or clubbing.  NEUROLOGIC: Cranial nerves II through XII are intact. Muscle strength 5/5 in all extremities. Sensation intact. Gait not checked.  PSYCHIATRIC: The patient is alert and oriented x 3.  SKIN: No obvious rash, lesion, or ulcer.   LABORATORY PANEL:   CBC  Recent Labs Lab 06/25/15 0751  WBC 6.4  HGB 13.3  HCT 39.8  PLT 165  MCV 75.4*  MCH 25.3*  MCHC 33.5  RDW 20.1*   ------------------------------------------------------------------------------------------------------------------  Chemistries   Recent Labs Lab 06/25/15 0751  NA 143  K 4.4  CL 107  CO2 29  GLUCOSE 135*  BUN 17  CREATININE 0.68  CALCIUM 9.0  AST 20  ALT 17  ALKPHOS 57  BILITOT 0.3   ------------------------------------------------------------------------------------------------------------------ estimated creatinine clearance is 65.7 mL/min (by C-G formula based on Cr  of 0.68). ------------------------------------------------------------------------------------------------------------------ No results for input(s): TSH, T4TOTAL, T3FREE, THYROIDAB in the last 72 hours.  Invalid input(s): FREET3   Coagulation profile No results for input(s): INR, PROTIME in the last 168 hours. ------------------------------------------------------------------------------------------------------------------- No results for input(s): DDIMER in the last 72 hours. -------------------------------------------------------------------------------------------------------------------   Urinalysis    Component Value Date/Time   COLORURINE YELLOW* 04/26/2015 2148   APPEARANCEUR CLEAR* 04/26/2015 2148   LABSPEC 1.016 04/26/2015 2148   PHURINE 5.0 04/26/2015 2148   GLUCOSEU NEGATIVE 04/26/2015 2148   HGBUR NEGATIVE 04/26/2015 2148   [place] NEGATIVE 04/26/2015 2148   [place] NEGATIVE 04/26/2015 2148   PROTEINUR NEGATIVE 04/26/2015 2148   NITRITE NEGATIVE 04/26/2015 2148   LEUKOCYTESUR NEGATIVE 04/26/2015 2148     RADIOLOGY: No results found.  IMPRESSION AND PLAN:  * GI bleed  Her hemoglobin is stable, she denies use of analgesics.  We will continue her Protonix oral daily, monitor hemoglobin.  I spoke to on call GI Dr.Oh- given her hemoglobin stays stable, and bleeding starts coming down- she does not need any other GI procedures, and she generally discharge with follow-up  with Dr. Allen Norris.  I will keep her on clear liquid diet for now, we can also consider using some stool softener on discharge.  Most likely the bleeding is from the internal hemorrhoid.  * Hypertension  Continue home meds  * Hypothyroidism  Continue Synthroid  * Anxiety  Continue Xanax as needed.  * Smoking  Counseled for 4 minutes to quit, offered patch.  All the records are reviewed and case discussed with ED provider. Management plans discussed with the patient, family and they are  in agreement.  CODE STATUS: Full  TOTAL TIME TAKING CARE OF THIS PATIENT: 45 minutes.  More than 50% of the visit was spent in counseling/coordination of care: YES   dicussed with pt and her cousin present in room.  Vaughan Basta M.D on 06/25/2015   Between 7am to 6pm - Pager - [phone]  After 6pm go to [email or web address] - password [email or web address] [place] [facility]  Office  [phone]  CC: Primary care physician; Rica Mast, MD

## 2015-06-25 NOTE — Progress Notes (Signed)
Spoke with Dr. Anselm Jungling.  Pt had duplicate Bisoprolo and HCTZ orders.  Dr. Anselm Jungling would like for pt to take combination Bisoprolo-HCTZ instead of both tablets separate.  Order D/C for separate tablets.

## 2015-06-25 NOTE — ED Provider Notes (Signed)
[facility] Emergency Department Provider Note   ____________________________________________  Time seen: 9 AM I have reviewed the triage vital signs and the triage nursing note.  HISTORY  Chief Complaint Rectal Bleeding   Historian Patient and her cousin  HPI Lindsay [name] is a 74 y.o. female who is here for evaluation for rectal bleeding. She woke up this morning around 6 or 6:30 and went to have a bowel movement and without straining and just passing gas she noted a large amount of bright red blood per rectum. This is not quite as bad as when she came in in May and had hematochezia and a colonoscopy revealing likely source as an internal hemorrhoid. No dizziness. No chest pain. No abdominal pain. She has had a lot of gas.    Past Medical History  Diagnosis Date  . Hypothyroidism   . Depression   . Rectal bleeding   . Anemia   . Diverticulosis   . Hematochezia   . Anxiety   . Anemia   . Arthritis     osteo, back  . SOB (shortness of breath)   . Hypertension     controlled on meds    Patient Active Problem List   Diagnosis Date Noted  . Hematochezia 03/18/2015  . Calcific Achilles tendinitis 07/16/2014  . Right leg pain 07/07/2014  . Tobacco use disorder 03/18/2013  . Medicare annual wellness visit, subsequent 09/10/2012  . Anxiety 09/10/2012  . Arthritis 09/10/2012  . Screening for breast cancer 09/10/2012  . Insomnia 02/13/2012  . Hypothyroidism 08/15/2011  . Hypertension 08/15/2011  . Depression 08/15/2011    Past Surgical History  Procedure Laterality Date  . Abdominal hysterectomy    . Foot surgery    . Colonoscopy  12/29/08  . Wrist surgery    . Cardiac catheterization  1995    normal, Dr Clayborn Bigness  . Colonoscopy N/A 04/18/2015    Procedure: COLONOSCOPY;  Surgeon: Lucilla Lame, MD;  Location: [place];  Service: Gastroenterology;  Laterality: N/A;  cecum time- 0957  . Polypectomy  04/18/2015    Procedure:  POLYPECTOMY INTESTINAL;  Surgeon: Lucilla Lame, MD;  Location: [place];  Service: Gastroenterology;;    Current Outpatient Rx  Name  Route  Sig  Dispense  Refill  . ALPRAZolam (XANAX) 0.25 MG tablet   Oral   Take 1 tablet (0.25 mg total) by mouth 2 (two) times daily as needed for anxiety.   60 tablet   0   . bisoprolol-hydrochlorothiazide (ZIAC) 10-6.25 MG per tablet      TAKE ONE (1) TABLET BY MOUTH EVERY DAY   90 tablet   1   . levothyroxine (SYNTHROID, LEVOTHROID) 50 MCG tablet      TAKE ONE (1) TABLET BY MOUTH EVERY DAY   90 tablet   3   . losartan (COZAAR) 50 MG tablet      TAKE ONE (1) TABLET BY MOUTH EVERY DAY   90 tablet   1   . meloxicam (MOBIC) 15 MG tablet   Oral   Take 1 tablet (15 mg total) by mouth daily.   90 tablet   1   . Multiple Vitamins-Minerals (MULTI COMPLETE PO)   Oral   Take 1 tablet by mouth. am         . pantoprazole (PROTONIX) 40 MG tablet   Oral   Take 1 tablet (40 mg total) by mouth daily.   30 tablet   5   . vitamin  B-12 (CYANOCOBALAMIN) 1000 MCG tablet   Oral   Take 1,000 mcg by mouth daily. am           Allergies Penicillins  Family History  Problem Relation Age of Onset  . Diabetes Mother   . Hypertension Mother   . Diabetes Sister   . Hypertension Sister     Social History History  Substance Use Topics  . Smoking status: Current Every Day Smoker -- 1.00 packs/day for 30 years  . Smokeless tobacco: Never Used  . Alcohol Use: Yes     Comment: occassionally/ whiskey on weekend    Review of Systems  Constitutional: Negative for fever. Eyes: Negative for visual changes. ENT: Negative for sore throat. Cardiovascular: Negative for chest pain. Respiratory: Negative for shortness of breath. Gastrointestinal: Negative for abdominal pain, vomiting and diarrhea. Genitourinary: Negative for dysuria. Musculoskeletal: Negative for back pain. Skin: Negative for rash. Neurological: Negative for  headaches, focal weakness or numbness. 10 point Review of Systems otherwise negative ____________________________________________   PHYSICAL EXAM:  VITAL SIGNS: ED Triage Vitals  Enc Vitals Group     BP 06/25/15 0744 204/97 mmHg     Pulse Rate 06/25/15 0744 79     Resp 06/25/15 0744 16     Temp 06/25/15 0744 98.1 F (36.7 C)     Temp Source 06/25/15 0744 Oral     SpO2 06/25/15 0744 100 %     Weight 06/25/15 0744 165 lb (74.844 kg)     Height 06/25/15 0744 5\' 6"  (1.676 m)     Head Cir --      Peak Flow --      Pain Score --      Pain Loc --      Pain Edu? --      Excl. in [place]? --      Constitutional: Alert and oriented. Well appearing and in no distress. Eyes: Conjunctivae are normal. PERRL. Normal extraocular movements. ENT   Head: Normocephalic and atraumatic.   Nose: No congestion/rhinnorhea.   Mouth/Throat: Mucous membranes are moist.   Neck: No stridor. Cardiovascular/Chest: Normal rate, regular rhythm.  No murmurs, rubs, or gallops. Respiratory: Normal respiratory effort without tachypnea nor retractions. Breath sounds are clear and equal bilaterally. No wheezes/rales/rhonchi. Gastrointestinal: Soft. No distention, no guarding, no rebound. Nontender . Obese  Genitourinary/rectal: No stool in the vault. Nontender rectal exam. Blood streaks on glove. Musculoskeletal: Nontender with normal range of motion in all extremities. No joint effusions.  No lower extremity tenderness nor edema. Neurologic:  Normal speech and language. No gross or focal neurologic deficits are appreciated. Skin:  Skin is warm, dry and intact. No rash noted. Psychiatric: Mood and affect are normal. Speech and behavior are normal. Patient exhibits appropriate insight and judgment.  ____________________________________________   EKG I, Lisa Roca, MD, the attending physician have personally viewed and interpreted all ECGs.  None ____________________________________________  LABS  (pertinent positives/negatives)  Complete metabolic panel within normal limits CBC showing a hemoglobin of 13.3  ____________________________________________  RADIOLOGY All Xrays were viewed by me. Imaging interpreted by Radiologist.  None __________________________________________  PROCEDURES  Procedure(s) performed: None Critical Care performed: None  ____________________________________________   ED COURSE / ASSESSMENT AND PLAN  CONSULTATIONS: Phone consultation with Hospitalist for admission  Pertinent labs & imaging results that were available during my care of the patient were reviewed by me and considered in my medical decision making (see chart for details).   Patient is stable with suspected lower GI bleed. I reviewed  the results of the colonoscopy which showed she does have diverticula, and did have polyps, and internal hemorrhoids. I think she needs observation for 24 hours to assess for any more serious rectal bleeding, change in hemoglobin, or any vital sign abnormalities.  Patient / Family / Caregiver informed of clinical course, medical decision-making process, and agree with plan.   I discussed return precautions, follow-up instructions, and discharged instructions with patient and/or family.  ___________________________________________   FINAL CLINICAL IMPRESSION(S) / ED DIAGNOSES   Final diagnoses:  Rectal bleeding    Lisa Roca, MD 06/25/15 [phone]

## 2015-06-26 LAB — CBC
HEMATOCRIT: 36.2 % (ref 35.0–47.0)
HEMOGLOBIN: 12.2 g/dL (ref 12.0–16.0)
MCH: 25.2 pg — ABNORMAL LOW (ref 26.0–34.0)
MCHC: 33.7 g/dL (ref 32.0–36.0)
MCV: 74.6 fL — ABNORMAL LOW (ref 80.0–100.0)
Platelets: 154 10*3/uL (ref 150–440)
RBC: 4.86 MIL/uL (ref 3.80–5.20)
RDW: 19.7 % — ABNORMAL HIGH (ref 11.5–14.5)
WBC: 7.5 10*3/uL (ref 3.6–11.0)

## 2015-06-26 LAB — BASIC METABOLIC PANEL
ANION GAP: 6 (ref 5–15)
BUN: 11 mg/dL (ref 6–20)
CO2: 29 mmol/L (ref 22–32)
Calcium: 8.7 mg/dL — ABNORMAL LOW (ref 8.9–10.3)
Chloride: 106 mmol/L (ref 101–111)
Creatinine, Ser: 0.63 mg/dL (ref 0.44–1.00)
GFR calc Af Amer: >60 mL/min (ref >60)
GFR calc non Af Amer: >60 mL/min (ref >60)
Glucose, Bld: 116 mg/dL — ABNORMAL HIGH (ref 65–99)
Potassium: 3.8 mmol/L (ref 3.5–5.1)
Sodium: 141 mmol/L (ref 135–145)

## 2015-06-26 MED ORDER — PANTOPRAZOLE SODIUM 40 MG PO TBEC: 40.0000 mg | DELAYED_RELEASE_TABLET | Freq: Every day | ORAL | Status: DC

## 2015-06-26 NOTE — Progress Notes (Signed)
MD order received in [facility] to discharge pt home today; verbally reviewed AVS with pt including medications; diet and activity level; no further questions voiced at this time; pt's discharge pending arrival of her ride home

## 2015-06-26 NOTE — Plan of Care (Signed)
Problem: Discharge Progression Outcomes Goal: Other Discharge Outcomes/Goals Outcome: Progressing Patient is alert and oriented, no c/o pain. Blood pressure was high, order for PRN Metoprolol IV given with improvement. Continues with clear liquid diet, no bloody stools this shift.

## 2015-06-26 NOTE — Discharge Summary (Signed)
[facility] [place] NAME: Lindsay [name]    MR#:  [id number]  DATE OF BIRTH:  08/27/1941  DATE OF ADMISSION:  06/25/2015 ADMITTING PHYSICIAN: Vaughan Basta, MD  DATE OF DISCHARGE: 06/26/2015  9:15 AM  PRIMARY CARE PHYSICIAN: Rica Mast, MD    ADMISSION DIAGNOSIS:  Rectal bleeding [K62.5]  DISCHARGE DIAGNOSIS:  Principal Problem:   GI bleed   SECONDARY DIAGNOSIS:   Past Medical History  Diagnosis Date  . Hypothyroidism   . Depression   . Rectal bleeding   . Anemia   . Diverticulosis   . Hematochezia   . Anxiety   . Anemia   . Arthritis     osteo, back  . SOB (shortness of breath)   . Hypertension     controlled on meds    HOSPITAL COURSE:  74 year-old female who was admitted due to rectal bleeding. For further details, refer to H and P.  1. Rectal bleed; She had recent colonoscopy 2 months ago which showed internal hemorhoids and polyp. Hemoglobin remained stable here. She had no overt bleeding. She can follow up with Dr. Allen Norris next week.  2. Hypertension Continue home meds  3. Hypothyroidism Continue Synthroid  4. Anxiety Continue Xanax as needed.  5. Smoking: She is encouraged to stop smoking.    DISCHARGE CONDITIONS AND DIET:  Heart Healthy Stable Condition  CONSULTS OBTAINED:     DRUG ALLERGIES:   Allergies  Allergen Reactions  . Penicillins Itching and Rash    DISCHARGE MEDICATIONS:   Discharge Medication List as of 06/26/2015  8:47 AM    CONTINUE these medications which have CHANGED   Details  !! pantoprazole (PROTONIX) 40 MG tablet Take 1 tablet (40 mg total) by mouth daily before breakfast., Starting 06/26/2015, Until Discontinued, Normal     !! - Potential duplicate medications found. Please discuss with provider.    CONTINUE these medications which have NOT CHANGED   Details  ALPRAZolam (XANAX) 0.25 MG tablet Take 1 tablet (0.25 mg total) by mouth 2 (two)  times daily as needed for anxiety., Starting 03/18/2015, Until Discontinued, Print    bisoprolol-hydrochlorothiazide (ZIAC) 10-6.25 MG per tablet TAKE ONE (1) TABLET BY MOUTH EVERY DAY, Normal    levothyroxine (SYNTHROID, LEVOTHROID) 50 MCG tablet TAKE ONE (1) TABLET BY MOUTH EVERY DAY, Normal    losartan (COZAAR) 50 MG tablet TAKE ONE (1) TABLET BY MOUTH EVERY DAY, Normal    Multiple Vitamins-Minerals (MULTI COMPLETE PO) Take 1 tablet by mouth. am, Until Discontinued, Historical Med    !! pantoprazole (PROTONIX) 40 MG tablet Take 1 tablet (40 mg total) by mouth daily., Starting 06/24/2015, Until Discontinued, Normal     !! - Potential duplicate medications found. Please discuss with provider.    STOP taking these medications     meloxicam (MOBIC) 15 MG tablet      vitamin B-12 (CYANOCOBALAMIN) 1000 MCG tablet               Today   CHIEF COMPLAINT:  Doing well no further bleeding   VITAL SIGNS:  Blood pressure 158/70, pulse 63, temperature 98.3 F (36.8 C), temperature source Oral, resp. rate 18, height 5\' 5"  (1.651 m), weight 75.206 kg (165 lb 12.8 oz), SpO2 97 %.   REVIEW OF SYSTEMS:  Review of Systems  Constitutional: Negative for fever, chills and malaise/fatigue.  HENT: Negative for sore throat.   Eyes: Negative for blurred vision.  Respiratory: Negative for cough, hemoptysis,  shortness of breath and wheezing.   Cardiovascular: Negative for chest pain, palpitations and leg swelling.  Gastrointestinal: Negative for nausea, vomiting, abdominal pain, diarrhea and blood in stool.  Genitourinary: Negative for dysuria.  Musculoskeletal: Negative for back pain.  Neurological: Negative for dizziness, tremors and headaches.  Endo/Heme/Allergies: Does not bruise/bleed easily.     PHYSICAL EXAMINATION:  GENERAL:  74 y.o.-year-old patient lying in the bed with no acute distress.  NECK:  Supple, no jugular venous distention. No thyroid enlargement, no tenderness.   LUNGS: Normal breath sounds bilaterally, no wheezing, rales,rhonchi  No use of accessory muscles of respiration.  CARDIOVASCULAR: S1, S2 normal. No murmurs, rubs, or gallops.  ABDOMEN: Soft, non-tender, non-distended. Bowel sounds present. No organomegaly or mass.  EXTREMITIES: No pedal edema, cyanosis, or clubbing.  PSYCHIATRIC: The patient is alert and oriented x 3.  SKIN: No obvious rash, lesion, or ulcer.   DATA REVIEW:   CBC  Recent Labs Lab 06/26/15 0339  WBC 7.5  HGB 12.2  HCT 36.2  PLT 154    Chemistries   Recent Labs Lab 06/25/15 0751 06/26/15 0339  NA 143 141  K 4.4 3.8  CL 107 106  CO2 29 29  GLUCOSE 135* 116*  BUN 17 11  CREATININE 0.68 0.63  CALCIUM 9.0 8.7*  AST 20  --   ALT 17  --   ALKPHOS 57  --   BILITOT 0.3  --     Cardiac Enzymes No results for input(s): TROPONINI in the last 168 hours.  Microbiology Results  @MICRORSLT48 @  RADIOLOGY:  No results found.    Management plans discussed with the patient and she is in agreement. Stable for discharge home  Patient should follow up with Dr. Allen Norris in 1 week and PCP  CODE STATUS:     Code Status Orders        Start     Ordered   06/25/15 1331  Full code   Continuous     06/25/15 1330      TOTAL TIME TAKING CARE OF THIS PATIENT: 35 minutes.    Doyle Tegethoff M.D on 06/26/2015 at 10:28 AM  Between 7am to 6pm - Pager - [phone] After 6pm go to [email or web address] - password [email or web address] [facility]  Office  [phone]  CC: Primary care physician; Rica Mast, MD

## 2015-06-27 ENCOUNTER — Telehealth: Payer: Self-pay

## 2015-06-27 NOTE — Telephone Encounter (Signed)
Transition Care Management Follow-up Telephone Call   Date discharged?06/26/15   How have you been since you were released from the hospital? Tired, but okay.    Do you understand why you were in the hospital? Yes, for rectal bleeding.   Do you understand the discharge instructions? Yes   Where were you discharged to? Home   Items Reviewed:  Medications reviewed: Yes  Allergies reviewed: Yes  Dietary changes reviewed:  Yes   Referrals reviewed: Yes   Functional Questionnaire:   Activities of Daily Living (ADLs):   She states they are independent in the following: Independent in all ADLs. States they require assistance with the following: No assistance required at this time.   Any transportation issues/concerns?: Yes   Any patient concerns? Monitoring closely for bright red blood in stool.  Currently sees dark stool only.   Confirmed importance and date/time of follow-up visits scheduled Yes, appointment made 07/01/15.  Provider Appointment booked with Dr. Gilford Rile (PCP).  Confirmed with patient if condition begins to worsen call PCP or go to the ER.  Patient was given the office number and encouraged to call back with question or concerns.  : Yes, patient verbalized understanding.

## 2015-07-01 ENCOUNTER — Ambulatory Visit (INDEPENDENT_AMBULATORY_CARE_PROVIDER_SITE_OTHER): Payer: Medicare Other | Admitting: Internal Medicine

## 2015-07-01 ENCOUNTER — Encounter: Payer: Self-pay | Admitting: Internal Medicine

## 2015-07-01 VITALS — BP 127/80 | HR 119 | Temp 98.2°F | Ht 65.0 in | Wt 159.0 lb

## 2015-07-01 DIAGNOSIS — S0502XA Injury of conjunctiva and corneal abrasion without foreign body, left eye, initial encounter: Secondary | ICD-10-CM | POA: Diagnosis not present

## 2015-07-01 DIAGNOSIS — K921 Melena: Secondary | ICD-10-CM

## 2015-07-01 LAB — CBC WITH DIFFERENTIAL/PLATELET
Eosinophils Relative: 1 % (ref 0.0–5.0)
HEMATOCRIT: 37.6 % (ref 36.0–46.0)
HEMOGLOBIN: 12.4 g/dL (ref 12.0–15.0)
Lymphocytes Relative: 33 % (ref 12.0–46.0)
MCHC: 33.1 g/dL (ref 30.0–36.0)
MCV: 77.1 fl — AB (ref 78.0–100.0)
Monocytes Relative: 11 % (ref 3.0–12.0)
NEUTROS PCT: 55 % (ref 43.0–77.0)
Platelets: 207 10*3/uL (ref 150.0–400.0)
RBC: 4.88 Mil/uL (ref 3.87–5.11)
RDW: 21.1 % — ABNORMAL HIGH (ref 11.5–15.5)
WBC: 6.8 10*3/uL (ref 4.0–10.5)

## 2015-07-01 MED ORDER — ERYTHROMYCIN 5 MG/GM OP OINT
1.0000 | TOPICAL_OINTMENT | Freq: Every day | OPHTHALMIC | Status: DC
Start: 2015-07-01 — End: 2017-02-11

## 2015-07-01 NOTE — Assessment & Plan Note (Addendum)
Exam is c/w mild corneal abrasion after traumatic injury. No foreign body appreciated. No visual deficits. Encouraged supportive care. Will start topical erythromycin. Will monitor for now. If any change in symptoms, then follow up as needed.

## 2015-07-01 NOTE — Patient Instructions (Addendum)
Labs today.  Stop Meloxicam. Use Tylenol for pain.  We will set up evaluation with colorectal surgery.

## 2015-07-01 NOTE — Progress Notes (Signed)
Subjective:    Patient ID: Lindsay [name], female    DOB: Dec 27, 1940, 74 y.o.   MRN: [id number]  HPI  74YO female presents for hospital follow up.  ADMITTED: 06/25/2015 DISCHARGE: 06/26/2015   DIAGNOSIS: GI Bleeding  Saturday, woke up and had flatuence, then BRBPR. No recent constipation. Went to ED. Bleeding subsided and she was discharged. Tuesday night had small amount of bleeding mixed with some black stool. This has resolved, and now having normal BM. No abdominal pain, nausea. Note some increased belching. Frustrated by recurrent bleeding.  This morning, scratched left eye with her finger by accident.  No visual changes noted, however eye is red. No drainage noted.  Wt Readings from Last 3 Encounters:  07/01/15 159 lb (72.122 kg)  06/25/15 165 lb 12.8 oz (75.206 kg)  05/03/15 161 lb 2 oz (73.086 kg)     Past medical, surgical, family and social history per today's encounter.  Review of Systems  Constitutional: Positive for fatigue. Negative for fever, chills, appetite change and unexpected weight change.  Eyes: Positive for pain and redness. Negative for discharge, itching and visual disturbance.  Respiratory: Negative for shortness of breath.   Cardiovascular: Negative for chest pain and leg swelling.  Gastrointestinal: Positive for blood in stool and anal bleeding. Negative for nausea, vomiting, abdominal pain, diarrhea and constipation.  Skin: Negative for color change and rash.  Hematological: Negative for adenopathy. Does not bruise/bleed easily.  Psychiatric/Behavioral: Negative for dysphoric mood. The patient is not nervous/anxious.        Objective:    BP 127/80 mmHg  Pulse 119  Temp(Src) 98.2 F (36.8 C) (Oral)  Ht 5\' 5"  (1.651 m)  Wt 159 lb (72.122 kg)  BMI 26.46 kg/m2  SpO2 98% Physical Exam  Constitutional: She is oriented to person, place, and time. She appears well-developed and well-nourished. No distress.  HENT:  Head: Normocephalic and  atraumatic.  Right Ear: External ear normal.  Left Ear: External ear normal.  Nose: Nose normal.  Mouth/Throat: Oropharynx is clear and moist. No oropharyngeal exudate.  Eyes: EOM are normal. Pupils are equal, round, and reactive to light. Right eye exhibits no discharge. Left conjunctiva is injected.  Neck: Normal range of motion. Neck supple. No thyromegaly present.  Cardiovascular: Normal rate, regular rhythm, normal heart sounds and intact distal pulses.  Exam reveals no gallop and no friction rub.   No murmur heard. Pulmonary/Chest: Effort normal. No respiratory distress. She has no wheezes. She has no rales.  Abdominal: Soft. Bowel sounds are normal. She exhibits no distension and no mass. There is no tenderness. There is no rebound and no guarding.  Musculoskeletal: Normal range of motion. She exhibits no edema or tenderness.  Lymphadenopathy:    She has no cervical adenopathy.  Neurological: She is alert and oriented to person, place, and time. No cranial nerve deficit. Coordination normal.  Skin: Skin is warm and dry. No rash noted. She is not diaphoretic. No erythema. No pallor.  Psychiatric: She has a normal mood and affect. Her behavior is normal. Judgment and thought content normal.          Assessment & Plan:   Problem List Items Addressed This Visit      Unprioritized   Corneal abrasion, left    Exam is c/w mild corneal abrasion after traumatic injury. No foreign body appreciated. No visual deficits. Encouraged supportive care. Will start topical erythromycin. Will monitor for now. If any change in symptoms, then follow up as needed.  Hematochezia - Primary    Recurrent hematochezia from internal hemorrhoid. Will recheck CBC today. Question if she might be a candidate for surgical intervention to limit bleeding. Will set up evaluation with colorectal surgery at [facility].      Relevant Orders   Ambulatory referral to General Surgery   CBC with Differential/Platelet        Return in about 4 weeks (around 07/29/2015) for Recheck.

## 2015-07-01 NOTE — Addendum Note (Signed)
Addended by: Ronette Deter A on: 07/01/2015 10:49 AM   Modules accepted: Level of Service

## 2015-07-01 NOTE — Progress Notes (Signed)
Pre visit review using our clinic review tool, if applicable. No additional management support is needed unless otherwise documented below in the visit note. 

## 2015-07-01 NOTE — Assessment & Plan Note (Signed)
Recurrent hematochezia from internal hemorrhoid. Will recheck CBC today. Question if she might be a candidate for surgical intervention to limit bleeding. Will set up evaluation with colorectal surgery at [facility].

## 2015-07-12 ENCOUNTER — Encounter: Payer: Self-pay | Admitting: Family Medicine

## 2015-07-12 ENCOUNTER — Ambulatory Visit (INDEPENDENT_AMBULATORY_CARE_PROVIDER_SITE_OTHER): Payer: Medicare Other | Admitting: Family Medicine

## 2015-07-12 VITALS — BP 122/76 | HR 72 | Temp 98.6°F | Ht 65.0 in | Wt 161.4 lb

## 2015-07-12 DIAGNOSIS — B029 Zoster without complications: Secondary | ICD-10-CM

## 2015-07-12 MED ORDER — VALACYCLOVIR HCL 1 G PO TABS: 1000.0000 mg | ORAL_TABLET | Freq: Three times a day (TID) | ORAL | Status: DC

## 2015-07-12 MED ORDER — HYDROCODONE-ACETAMINOPHEN 5-325 MG PO TABS: 1.0000 | ORAL_TABLET | Freq: Three times a day (TID) | ORAL | Status: DC

## 2015-07-12 NOTE — Patient Instructions (Signed)
It was nice to see you today.  Take the valtrex 3 times daily for 7 days.  Use the pain medication as prescribed.  Be sure to get your shingles shot after you're feeling better.  Take care  Dr. Lacinda Axon

## 2015-07-12 NOTE — Assessment & Plan Note (Signed)
History and physical exam consistent with herpes zoster. This is a recurrence of the patient. Treating with Valtrex and using hydrocodone for pain. Patient to follow-up as needed/as previously scheduled

## 2015-07-12 NOTE — Progress Notes (Signed)
   Subjective:  Patient ID: Lindsay [name], female    DOB: August 04, 1941  Age: 74 y.o. MRN: [id number]  CC: Rash/? Shingles.   HPI 74 year old female with a history of herpes zoster presents to the clinic today concerned for recurrence.  Patient reports she recently developed a painful area on her left buttock. She states the area appears as a blister. She states it is very painful, currently 8/10 in severity. No known exacerbating factors. She's had a little relief with over-the-counter Tylenol. She states that this is very similar to her prior bouts of shingles and is located in the exact same location. No recent fevers or chills. No recent illnesses per her report. She does note that she has been under quite a bit of stress lately as her dog had to be put down and her husband is now in a nursing home.   Social Hx - Current smoker.   Review of Systems  Objective:  BP 122/76 mmHg  Pulse 72  Temp(Src) 98.6 F (37 C) (Oral)  Ht 5\' 5"  (1.651 m)  Wt 161 lb 6 oz (73.199 kg)  BMI 26.85 kg/m2  SpO2 96%  BP/Weight 07/12/2015 07/01/2015 0/38/3338  Systolic BP 329 191 660  Diastolic BP 76 80 70  Wt. (Lbs) 161.38 159 -  BMI 26.85 26.46 -   Physical Exam  Constitutional: She appears well-developed and well-nourished. No distress.  Cardiovascular: Normal rate and regular rhythm.   No murmur heard. Pulmonary/Chest: Effort normal and breath sounds normal. No respiratory distress. She has no wheezes. She has no rales.  Abdominal: Soft. She exhibits no distension. There is no tenderness.  Skin: Skin is warm and dry.  Approximately 3 cm linear blister noted. No surround erythema.  Psychiatric: She has a normal mood and affect.   Assessment & Plan:   Problem List Items Addressed This Visit    None      Meds ordered this encounter  Medications  . valACYclovir (VALTREX) 1000 MG tablet    Sig: Take 1 tablet (1,000 mg total) by mouth 3 (three) times daily. For 7 days.    Dispense:  21 tablet      Refill:  0  . HYDROcodone-acetaminophen (NORCO/VICODIN) 5-325 MG per tablet    Sig: Take 1 tablet by mouth every 8 (eight) hours.    Dispense:  30 tablet    Refill:  0     Follow-up: PRN   Thersa Salt, DO

## 2015-07-12 NOTE — Progress Notes (Signed)
Pre visit review using our clinic review tool, if applicable. No additional management support is needed unless otherwise documented below in the visit note. 

## 2015-07-25 ENCOUNTER — Other Ambulatory Visit: Payer: Self-pay | Admitting: Internal Medicine

## 2015-08-01 ENCOUNTER — Telehealth: Payer: Self-pay | Admitting: Internal Medicine

## 2015-08-01 ENCOUNTER — Encounter: Payer: Self-pay | Admitting: Family Medicine

## 2015-08-01 ENCOUNTER — Ambulatory Visit (INDEPENDENT_AMBULATORY_CARE_PROVIDER_SITE_OTHER): Payer: Medicare Other | Admitting: Family Medicine

## 2015-08-01 ENCOUNTER — Encounter: Payer: Self-pay | Admitting: Internal Medicine

## 2015-08-01 ENCOUNTER — Ambulatory Visit (INDEPENDENT_AMBULATORY_CARE_PROVIDER_SITE_OTHER): Payer: Medicare Other | Admitting: Internal Medicine

## 2015-08-01 VITALS — BP 134/82 | HR 83 | Temp 98.1°F | Ht 65.0 in | Wt 159.0 lb

## 2015-08-01 VITALS — BP 134/82 | HR 83 | Temp 98.1°F | Ht 65.0 in | Wt 159.8 lb

## 2015-08-01 DIAGNOSIS — J0100 Acute maxillary sinusitis, unspecified: Secondary | ICD-10-CM | POA: Insufficient documentation

## 2015-08-01 MED ORDER — GUAIFENESIN-CODEINE 100-10 MG/5ML PO SOLN: 5.0000 mL | Freq: Two times a day (BID) | ORAL | Status: DC | PRN

## 2015-08-01 MED ORDER — LEVOFLOXACIN 500 MG PO TABS: 500.0000 mg | ORAL_TABLET | Freq: Every day | ORAL | Status: DC

## 2015-08-01 MED ORDER — HYDROCOD POLST-CPM POLST ER 10-8 MG/5ML PO SUER: 5.0000 mL | Freq: Two times a day (BID) | ORAL | Status: DC | PRN

## 2015-08-01 NOTE — Progress Notes (Signed)
Pre visit review using our clinic review tool, if applicable. No additional management support is needed unless otherwise documented below in the visit note. 

## 2015-08-01 NOTE — Telephone Encounter (Signed)
Pt called states the cough medication is not covered by insurance.  Pt requested a medication that is covered.  Please advise

## 2015-08-01 NOTE — Assessment & Plan Note (Signed)
Symptoms most consistent with left maxillary sinusitis. Will start Levaquin and use prn Tussionex for cough. Follow up if symptoms are not improving. Note h/o sinus infections, followed by ENT.

## 2015-08-01 NOTE — Telephone Encounter (Signed)
Pharmacy states that no cough medication is covered by her insurance but the Robitussin [facility] would be cheaper to purchase

## 2015-08-01 NOTE — Progress Notes (Signed)
Subjective:    Patient ID: Lindsay [name], female    DOB: 1941-06-09, 74 y.o.   MRN: [id number]  HPI  74YO female presents for acute visit.  Developed left sinus pressure and sore throat last week. Symptoms worsened over course of week. Used some OTC meds with no improvement. No fever, chills. Cough productive of thick mucous, yellow-green. No dyspnea. No trouble swallowing. Some trouble sleeping because of cough. Feels generally fatigued. Traveled to [place] this week.  Past Medical History  Diagnosis Date  . Hypothyroidism   . Depression   . Rectal bleeding   . Anemia   . Diverticulosis   . Hematochezia   . Anxiety   . Anemia   . Arthritis     osteo, back  . SOB (shortness of breath)   . Hypertension     controlled on meds   Family History  Problem Relation Age of Onset  . Diabetes Mother   . Hypertension Mother   . Diabetes Sister   . Hypertension Sister    Past Surgical History  Procedure Laterality Date  . Abdominal hysterectomy    . Foot surgery    . Colonoscopy  12/29/08  . Wrist surgery    . Cardiac catheterization  1995    normal, Dr Clayborn Bigness  . Colonoscopy N/A 04/18/2015    Procedure: COLONOSCOPY;  Surgeon: Lucilla Lame, MD;  Location: [place];  Service: Gastroenterology;  Laterality: N/A;  cecum time- 0957  . Polypectomy  04/18/2015    Procedure: POLYPECTOMY INTESTINAL;  Surgeon: Lucilla Lame, MD;  Location: [place];  Service: Gastroenterology;;   Social History   Social History  . Marital Status: Married    Spouse Name: N/A  . Number of Children: N/A  . Years of Education: N/A   Social History Main Topics  . Smoking status: Current Every Day Smoker -- 1.00 packs/day for 30 years  . Smokeless tobacco: Never Used  . Alcohol Use: 0.0 oz/week    0 Standard drinks or equivalent per week     Comment: occassionally/ whiskey on weekend  . Drug Use: No  . Sexual Activity: Not on file   Other Topics Concern  . Not on file    Social History Narrative    Review of Systems  Constitutional: Positive for chills and fatigue. Negative for fever and unexpected weight change.  HENT: Positive for congestion, ear pain, postnasal drip, rhinorrhea, sinus pressure and sore throat. Negative for ear discharge, facial swelling, hearing loss, mouth sores, nosebleeds, sneezing, tinnitus, trouble swallowing and voice change.   Eyes: Negative for pain, discharge, redness and visual disturbance.  Respiratory: Positive for cough. Negative for chest tightness, shortness of breath, wheezing and stridor.   Cardiovascular: Negative for chest pain, palpitations and leg swelling.  Gastrointestinal: Negative for vomiting, diarrhea, constipation and blood in stool.  Musculoskeletal: Positive for arthralgias. Negative for myalgias, neck pain and neck stiffness.  Skin: Negative for color change and rash.  Neurological: Negative for dizziness, weakness, light-headedness and headaches.  Hematological: Negative for adenopathy. Does not bruise/bleed easily.       Objective:    BP 134/82 mmHg  Pulse 83  Temp(Src) 98.1 F (36.7 C)  Ht 5\' 5"  (1.651 m)  Wt 159 lb (72.122 kg)  BMI 26.46 kg/m2  SpO2 97% Physical Exam  Constitutional: She is oriented to person, place, and time. She appears well-developed and well-nourished. No distress.  HENT:  Head: Normocephalic and atraumatic.  Right Ear: External ear normal.  Left Ear: External ear normal.  Nose: Mucosal edema and rhinorrhea present. Right sinus exhibits no maxillary sinus tenderness. Left sinus exhibits maxillary sinus tenderness.  Mouth/Throat: Oropharynx is clear and moist. No oropharyngeal exudate or posterior oropharyngeal erythema.  Eyes: Conjunctivae are normal. Pupils are equal, round, and reactive to light. Right eye exhibits no discharge. Left eye exhibits no discharge. No scleral icterus.  Neck: Normal range of motion. Neck supple. No tracheal deviation present. No  thyromegaly present.  Cardiovascular: Normal rate, regular rhythm, normal heart sounds and intact distal pulses.  Exam reveals no gallop and no friction rub.   No murmur heard. Pulmonary/Chest: Effort normal. No accessory muscle usage. No tachypnea. No respiratory distress. She has no decreased breath sounds. She has no wheezes. She has rhonchi in the left middle field and the left lower field. She has no rales. She exhibits no tenderness.  Musculoskeletal: Normal range of motion. She exhibits no edema or tenderness.  Lymphadenopathy:    She has no cervical adenopathy.  Neurological: She is alert and oriented to person, place, and time. No cranial nerve deficit. She exhibits normal muscle tone. Coordination normal.  Skin: Skin is warm and dry. No rash noted. She is not diaphoretic. No erythema. No pallor.  Psychiatric: She has a normal mood and affect. Her behavior is normal. Judgment and thought content normal.          Assessment & Plan:   Problem List Items Addressed This Visit      Unprioritized   Acute maxillary sinusitis - Primary    Symptoms most consistent with left maxillary sinusitis. Will start Levaquin and use prn Tussionex for cough. Follow up if symptoms are not improving. Note h/o sinus infections, followed by ENT.      Relevant Medications   levofloxacin (LEVAQUIN) 500 MG tablet   chlorpheniramine-HYDROcodone (TUSSIONEX PENNKINETIC ER) 10-8 MG/5ML SUER       Return if symptoms worsen or fail to improve.

## 2015-08-01 NOTE — Telephone Encounter (Signed)
Can we ask the pharmacy if Robitussin [facility] is covered?

## 2015-08-01 NOTE — Patient Instructions (Signed)
Start Levaquin 500mg  daily.  Use Tussionex cough syrup as needed.  Follow up if symptoms are not improving this week.

## 2015-08-10 NOTE — Progress Notes (Signed)
Opened in error. Patient switched to another providers schedule.

## 2015-08-17 ENCOUNTER — Ambulatory Visit (INDEPENDENT_AMBULATORY_CARE_PROVIDER_SITE_OTHER): Payer: Medicare Other | Admitting: Family Medicine

## 2015-08-17 ENCOUNTER — Encounter: Payer: Self-pay | Admitting: Family Medicine

## 2015-08-17 VITALS — BP 126/82 | HR 63 | Temp 97.8°F | Ht 65.0 in | Wt 160.0 lb

## 2015-08-17 DIAGNOSIS — B029 Zoster without complications: Secondary | ICD-10-CM

## 2015-08-17 DIAGNOSIS — J0100 Acute maxillary sinusitis, unspecified: Secondary | ICD-10-CM

## 2015-08-17 MED ORDER — VALACYCLOVIR HCL 1 G PO TABS: 1000.0000 mg | ORAL_TABLET | Freq: Three times a day (TID) | ORAL | Status: DC

## 2015-08-17 NOTE — Progress Notes (Signed)
Patient ID: ETNA FORQUER, female   DOB: 08-05-1941, 74 y.o.   MRN: [id number]  Tommi Rumps, MD Phone: [phone]  Lindsay [name] is a 74 y.o. female who presents today for same day appointment.  Shingles: patient reports onset of rash yesterday. Is on her left upper outer buttock. Is in her typical distribution for shingles. Had this one month ago and was treated with valtrex and vicodin. Patient notes the rash resolved after her last episode, though the pain stayed at a low level. Rash started yesterday. Not much pain at this time. Notes she has vicodin left from her last visit. This is the 4th time she has had shingles. No prior shingles vaccine.  Cough: patient notes she was treated for a sinus infection late last month. She received levaquin for this. She has had resolution of all her symptoms other than cough. No chest pain or shortness of breath. No fevers. No sinus congestion. Cough is productive of clear mucus. Feels much improved.   PMH: smoker.    ROS see HPI  Objective  Physical Exam Filed Vitals:   08/17/15 0843  BP: 126/82  Pulse: 63  Temp: 97.8 F (36.6 C)    Physical Exam  Constitutional: She is well-developed, well-nourished, and in no distress.  HENT:  Head: Normocephalic and atraumatic.  Right Ear: External ear normal.  Left Ear: External ear normal.  Mouth/Throat: Oropharynx is clear and moist. No oropharyngeal exudate.  Normal TMs bilaterally  Eyes: Conjunctivae are normal. Pupils are equal, round, and reactive to light.  Neck: Neck supple.  Cardiovascular: Normal rate, regular rhythm and normal heart sounds.  Exam reveals no gallop and no friction rub.   No murmur heard. Pulmonary/Chest: Effort normal and breath sounds normal. No respiratory distress. She has no wheezes. She has no rales.  Lymphadenopathy:    She has no cervical adenopathy.  Skin: Skin is warm and dry. She is not diaphoretic.        Assessment/Plan: Please see individual  problem list.  Acute maxillary sinusitis Patient with all symptoms resolved with exception of cough, which is improving. She is well appearing and her vitals are stable. Likely this is post viral/sinusitis cough and will improve with time. Normal pulm exam today makes PNA unlikely. Discussed that she does not need antibiotics for this and that cough suppressant could be beneficial, though time would be the thing that makes her better. Advised to not take cough suppressant at same time as pain medication. Will monitor. Given return precautions.   Herpes zoster History and exam consistent with shingles, though has had recurrence fairly quickly since last shingles out break. The lateral vesicle was unroofed and PCR will be sent to confirm shingles diagnosis. Will treat with valtrex. Advised to monitor the lesion and seek medical attention if pain worsens, redness spreads, or if she develops fevers. Advised to not take pain medication and cough suppressant at the same time. Given return precautions.     Orders Placed This Encounter  Procedures  . Varicella-zoster by PCR    Meds ordered this encounter  Medications  . valACYclovir (VALTREX) 1000 MG tablet    Sig: Take 1 tablet (1,000 mg total) by mouth 3 (three) times daily. For 7 days.    Dispense:  21 tablet    Refill:  0    Tommi Rumps

## 2015-08-17 NOTE — Assessment & Plan Note (Signed)
Patient with all symptoms resolved with exception of cough, which is improving. She is well appearing and her vitals are stable. Likely this is post viral/sinusitis cough and will improve with time. Normal pulm exam today makes PNA unlikely. Discussed that she does not need antibiotics for this and that cough suppressant could be beneficial, though time would be the thing that makes her better. Advised to not take cough suppressant at same time as pain medication. Will monitor. Given return precautions.

## 2015-08-17 NOTE — Progress Notes (Signed)
Pre visit review using our clinic review tool, if applicable. No additional management support is needed unless otherwise documented below in the visit note. 

## 2015-08-17 NOTE — Patient Instructions (Signed)
Nice to meet you. We will send this for culture.  Please take the valtrex. Do not take the vicodin and cough medication at the same time. If you develop fever, spreading rash, shortness of breath, or worsening cough please seek medical attention.

## 2015-08-17 NOTE — Assessment & Plan Note (Addendum)
History and exam consistent with shingles, though has had recurrence fairly quickly since last shingles out break. The lateral vesicle was unroofed and PCR will be sent to confirm shingles diagnosis. Will treat with valtrex. Advised to monitor the lesion and seek medical attention if pain worsens, redness spreads, or if she develops fevers. Advised to not take pain medication and cough suppressant at the same time. Given return precautions.

## 2015-08-21 LAB — VARICELLA-ZOSTER BY PCR: VZV DNA, QL PCR: NOT DETECTED

## 2015-08-29 ENCOUNTER — Other Ambulatory Visit: Payer: Self-pay | Admitting: Specialist

## 2015-08-29 DIAGNOSIS — M223X1 Other derangements of patella, right knee: Secondary | ICD-10-CM

## 2015-08-29 DIAGNOSIS — M223X2 Other derangements of patella, left knee: Secondary | ICD-10-CM

## 2015-09-13 ENCOUNTER — Ambulatory Visit
Admission: RE | Admit: 2015-09-13 | Discharge: 2015-09-13 | Disposition: A | Discharge status: Home / Self Care | Payer: Medicare Other | Source: Ambulatory Visit | Attending: Specialist | Admitting: Specialist

## 2015-09-13 DIAGNOSIS — X58XXXA Exposure to other specified factors, initial encounter: Secondary | ICD-10-CM | POA: Diagnosis not present

## 2015-09-13 DIAGNOSIS — M25461 Effusion, right knee: Secondary | ICD-10-CM | POA: Insufficient documentation

## 2015-09-13 DIAGNOSIS — M84351A Stress fracture, right femur, initial encounter for fracture: Secondary | ICD-10-CM | POA: Insufficient documentation

## 2015-09-13 DIAGNOSIS — S83271A Complex tear of lateral meniscus, current injury, right knee, initial encounter: Secondary | ICD-10-CM | POA: Diagnosis not present

## 2015-09-13 DIAGNOSIS — M223X1 Other derangements of patella, right knee: Secondary | ICD-10-CM

## 2015-09-22 ENCOUNTER — Inpatient Hospital Stay: Admission: RE | Admit: 2015-09-22 | Payer: Medicare Other | Source: Ambulatory Visit

## 2015-09-22 ENCOUNTER — Encounter: Payer: Self-pay | Admitting: *Deleted

## 2015-09-22 NOTE — Patient Instructions (Signed)
  Your procedure is scheduled on: 10-05-15 [facility]) Report to [place] To find out your arrival time please call [phone] between 1PM - 3PM on 10-04-15 (TUESDAY)  Remember: Instructions that are not followed completely may result in serious medical risk, up to and including death, or upon the discretion of your surgeon and anesthesiologist your surgery may need to be rescheduled.    _X___ 1. Do not eat food or drink liquids after midnight. No gum chewing or hard candies.     _X___ 2. No Alcohol for 24 hours before or after surgery.   ____ 3. Bring all medications with you on the day of surgery if instructed.    _X___ 4. Notify your doctor if there is any change in your medical condition     (cold, fever, infections).     Do not wear jewelry, make-up, hairpins, clips or nail polish.  Do not wear lotions, powders, or perfumes. You may wear deodorant.  Do not shave 48 hours prior to surgery. Men may shave face and neck.  Do not bring valuables to the hospital.    [facility] is not responsible for any belongings or valuables.               Contacts, dentures or bridgework may not be worn into surgery.  Leave your suitcase in the car. After surgery it may be brought to your room.  For patients admitted to the hospital, discharge time is determined by your  treatment team.   Patients discharged the day of surgery will not be allowed to drive home.   Please read over the following fact sheets that you were given:      _X___ Take these medicines the morning of surgery with A SIP OF WATER:    1. LEVOTHYROXINE  2. LOSARTAN  3. PROTONIX  4. TAKE AN EXTRA PROTONIX Tuesday NIGHT  5.  6.  ____ Fleet Enema (as directed)   _X___ Use CHG Soap as directed  ____ Use inhalers on the day of surgery  ____ Stop metformin 2 days prior to surgery    ____ Take 1/2 of usual insulin dose the night before surgery and none on the morning of surgery.   ____  Stop Coumadin/Plavix/aspirin-N/A  ____ Stop Anti-inflammatories-ONLY TAKE TYLENOL IF NEEDED   ____ Stop supplements until after surgery.    ____ Bring C-Pap to the hospital.

## 2015-09-29 ENCOUNTER — Encounter
Admission: RE | Admit: 2015-09-29 | Discharge: 2015-09-29 | Disposition: A | Discharge status: Home / Self Care | Payer: Medicare Other | Source: Ambulatory Visit | Attending: Anesthesiology | Admitting: Anesthesiology

## 2015-09-29 DIAGNOSIS — Z01818 Encounter for other preprocedural examination: Secondary | ICD-10-CM | POA: Diagnosis not present

## 2015-09-29 LAB — POTASSIUM: POTASSIUM: 4 mmol/L (ref 3.5–5.1)

## 2015-10-05 ENCOUNTER — Ambulatory Visit: Payer: Medicare Other | Admitting: Anesthesiology

## 2015-10-05 ENCOUNTER — Encounter: Payer: Self-pay | Admitting: *Deleted

## 2015-10-05 ENCOUNTER — Ambulatory Visit
Admission: RE | Admit: 2015-10-05 | Discharge: 2015-10-05 | Disposition: A | Discharge status: Home / Self Care | Payer: Medicare Other | Source: Ambulatory Visit | Attending: Specialist | Admitting: Specialist

## 2015-10-05 ENCOUNTER — Encounter
Admission: RE | Disposition: A | Discharge status: Home / Self Care | Payer: Self-pay | Source: Ambulatory Visit | Attending: Specialist

## 2015-10-05 DIAGNOSIS — Z79899 Other long term (current) drug therapy: Secondary | ICD-10-CM | POA: Insufficient documentation

## 2015-10-05 DIAGNOSIS — E039 Hypothyroidism, unspecified: Secondary | ICD-10-CM | POA: Insufficient documentation

## 2015-10-05 DIAGNOSIS — F419 Anxiety disorder, unspecified: Secondary | ICD-10-CM | POA: Diagnosis not present

## 2015-10-05 DIAGNOSIS — M2341 Loose body in knee, right knee: Secondary | ICD-10-CM | POA: Insufficient documentation

## 2015-10-05 DIAGNOSIS — K219 Gastro-esophageal reflux disease without esophagitis: Secondary | ICD-10-CM | POA: Insufficient documentation

## 2015-10-05 DIAGNOSIS — M1990 Unspecified osteoarthritis, unspecified site: Secondary | ICD-10-CM | POA: Diagnosis not present

## 2015-10-05 DIAGNOSIS — M2241 Chondromalacia patellae, right knee: Secondary | ICD-10-CM | POA: Diagnosis not present

## 2015-10-05 DIAGNOSIS — F17210 Nicotine dependence, cigarettes, uncomplicated: Secondary | ICD-10-CM | POA: Diagnosis not present

## 2015-10-05 DIAGNOSIS — S83281A Other tear of lateral meniscus, current injury, right knee, initial encounter: Secondary | ICD-10-CM | POA: Insufficient documentation

## 2015-10-05 DIAGNOSIS — X58XXXA Exposure to other specified factors, initial encounter: Secondary | ICD-10-CM | POA: Diagnosis not present

## 2015-10-05 DIAGNOSIS — M79604 Pain in right leg: Secondary | ICD-10-CM

## 2015-10-05 DIAGNOSIS — I10 Essential (primary) hypertension: Secondary | ICD-10-CM | POA: Insufficient documentation

## 2015-10-05 HISTORY — DX: Gastro-esophageal reflux disease without esophagitis: K21.9

## 2015-10-05 HISTORY — PX: KNEE ARTHROSCOPY: SHX127

## 2015-10-05 SURGERY — ARTHROSCOPY, KNEE
Anesthesia: General | Site: Knee | Laterality: Right | Wound class: Clean

## 2015-10-05 MED ORDER — MORPHINE SULFATE (PF) 4 MG/ML IV SOLN
INTRAVENOUS | Status: AC
  Filled 2015-10-05: qty 1

## 2015-10-05 MED ORDER — FENTANYL CITRATE (PF) 100 MCG/2ML IJ SOLN
INTRAMUSCULAR | Status: DC | PRN
  Administered 2015-10-05: 50 ug via INTRAVENOUS
  Administered 2015-10-05: 25 ug via INTRAVENOUS
  Administered 2015-10-05 (×2): 50 ug via INTRAVENOUS
  Administered 2015-10-05: 25 ug via INTRAVENOUS

## 2015-10-05 MED ORDER — HYDROCODONE-ACETAMINOPHEN 5-325 MG PO TABS: 1.0000 | ORAL_TABLET | Freq: Four times a day (QID) | ORAL | Status: DC | PRN

## 2015-10-05 MED ORDER — ONDANSETRON HCL 4 MG/2ML IJ SOLN
4.0000 mg | Freq: Once | INTRAMUSCULAR | Status: AC | PRN
  Administered 2015-10-05: 4 mg via INTRAVENOUS

## 2015-10-05 MED ORDER — EPHEDRINE SULFATE 50 MG/ML IJ SOLN
INTRAMUSCULAR | Status: DC | PRN
  Administered 2015-10-05 (×4): 10 mg via INTRAVENOUS

## 2015-10-05 MED ORDER — MORPHINE SULFATE 4 MG/ML IJ SOLN
INTRAMUSCULAR | Status: DC | PRN
  Administered 2015-10-05: 21 mL via INTRA_ARTICULAR

## 2015-10-05 MED ORDER — MELOXICAM 7.5 MG PO TABS
ORAL_TABLET | ORAL | Status: AC
  Filled 2015-10-05: qty 2

## 2015-10-05 MED ORDER — MELOXICAM 7.5 MG PO TABS: 15.0000 mg | ORAL_TABLET | Freq: Once | ORAL | Status: DC

## 2015-10-05 MED ORDER — CEFAZOLIN SODIUM-DEXTROSE 2-3 GM-% IV SOLR
2.0000 g | Freq: Once | INTRAVENOUS | Status: AC
  Administered 2015-10-05: 2 g via INTRAVENOUS

## 2015-10-05 MED ORDER — GABAPENTIN 400 MG PO CAPS
ORAL_CAPSULE | ORAL | Status: AC
  Administered 2015-10-05: 400 mg via ORAL
  Filled 2015-10-05: qty 1

## 2015-10-05 MED ORDER — BUPIVACAINE-EPINEPHRINE (PF) 0.5% -1:200000 IJ SOLN
INTRAMUSCULAR | Status: AC
  Filled 2015-10-05: qty 60

## 2015-10-05 MED ORDER — BISOPROLOL FUMARATE 10 MG PO TABS: 10.0000 mg | ORAL_TABLET | Freq: Once | ORAL | Status: DC

## 2015-10-05 MED ORDER — FENTANYL CITRATE (PF) 100 MCG/2ML IJ SOLN
INTRAMUSCULAR | Status: AC
  Filled 2015-10-05: qty 2

## 2015-10-05 MED ORDER — CEFAZOLIN SODIUM-DEXTROSE 2-3 GM-% IV SOLR
INTRAVENOUS | Status: AC
  Filled 2015-10-05: qty 50

## 2015-10-05 MED ORDER — LACTATED RINGERS IV SOLN
INTRAVENOUS | Status: DC
  Administered 2015-10-05 (×2): via INTRAVENOUS

## 2015-10-05 MED ORDER — GLYCOPYRROLATE 0.2 MG/ML IJ SOLN
INTRAMUSCULAR | Status: DC | PRN
  Administered 2015-10-05: 0.2 mg via INTRAVENOUS

## 2015-10-05 MED ORDER — BUPIVACAINE-EPINEPHRINE (PF) 0.25% -1:200000 IJ SOLN
INTRAMUSCULAR | Status: DC | PRN
  Administered 2015-10-05: 30 mL via PERINEURAL

## 2015-10-05 MED ORDER — FENTANYL CITRATE (PF) 100 MCG/2ML IJ SOLN
25.0000 ug | INTRAMUSCULAR | Status: DC | PRN
  Administered 2015-10-05 (×3): 25 ug via INTRAVENOUS

## 2015-10-05 MED ORDER — PROPOFOL 10 MG/ML IV BOLUS
INTRAVENOUS | Status: DC | PRN
  Administered 2015-10-05: 100 mg via INTRAVENOUS

## 2015-10-05 MED ORDER — PHENYLEPHRINE HCL 10 MG/ML IJ SOLN
INTRAMUSCULAR | Status: DC | PRN
  Administered 2015-10-05 (×4): 100 ug via INTRAVENOUS

## 2015-10-05 MED ORDER — GABAPENTIN 400 MG PO CAPS
400.0000 mg | ORAL_CAPSULE | Freq: Once | ORAL | Status: AC
  Administered 2015-10-05: 400 mg via ORAL

## 2015-10-05 MED ORDER — BUPIVACAINE-EPINEPHRINE (PF) 0.25% -1:200000 IJ SOLN
INTRAMUSCULAR | Status: AC
  Filled 2015-10-05: qty 60

## 2015-10-05 MED ORDER — GABAPENTIN 400 MG PO CAPS: 400.0000 mg | ORAL_CAPSULE | Freq: Three times a day (TID) | ORAL | Status: DC

## 2015-10-05 MED ORDER — ONDANSETRON HCL 4 MG/2ML IJ SOLN
INTRAMUSCULAR | Status: AC
  Filled 2015-10-05: qty 2

## 2015-10-05 SURGICAL SUPPLY — 23 items
BAG COUNTER SPONGE EZ (MISCELLANEOUS) IMPLANT
BLADE AGGRESSIVE PLUS 4.0 (BLADE) IMPLANT
BNDG ESMARK 6X12 TAN STRL LF (GAUZE/BANDAGES/DRESSINGS) ×2 IMPLANT
BUR RADIUS 4.0X18.5 (BURR) ×2 IMPLANT
CHLORAPREP W/TINT 26ML (MISCELLANEOUS) ×2 IMPLANT
CUTTER SLOTTED WHISKER 4.0 (BURR) IMPLANT
GAUZE SPONGE 4X4 12PLY STRL (GAUZE/BANDAGES/DRESSINGS) ×2 IMPLANT
GLOVE BIO SURGEON STRL SZ8 (GLOVE) ×2 IMPLANT
GOWN STRL REUS W/ TWL LRG LVL3 (GOWN DISPOSABLE) ×2 IMPLANT
GOWN STRL REUS W/TWL LRG LVL3 (GOWN DISPOSABLE) ×2
IV LACTATED RINGER IRRG 3000ML (IV SOLUTION) ×4
IV LR IRRIG 3000ML ARTHROMATIC (IV SOLUTION) ×4 IMPLANT
KIT RM TURNOVER STRD PROC AR (KITS) ×2 IMPLANT
MANIFOLD NEPTUNE II (INSTRUMENTS) ×2 IMPLANT
NDL SAFETY 18GX1.5 (NEEDLE) ×4 IMPLANT
PACK ARTHROSCOPY KNEE (MISCELLANEOUS) ×2 IMPLANT
SET TUBE SUCT SHAVER OUTFL 24K (TUBING) ×2 IMPLANT
SOL PREP PVP 2OZ (MISCELLANEOUS) ×2
SOLUTION PREP PVP 2OZ (MISCELLANEOUS) ×1 IMPLANT
SUT ETHILON 3 0 FSLX (SUTURE) ×2 IMPLANT
SYR 30ML LL (SYRINGE) ×4 IMPLANT
TUBING ARTHRO INFLOW-ONLY STRL (TUBING) ×2 IMPLANT
WAND HAND CNTRL MULTIVAC 50 (MISCELLANEOUS) ×2 IMPLANT

## 2015-10-05 NOTE — Anesthesia Procedure Notes (Signed)
Procedure Name: LMA Insertion Date/Time: 10/05/2015 7:45 AM Performed by: Nelda Marseille Pre-anesthesia Checklist: Patient identified, Patient being monitored, Timeout performed, Emergency Drugs available and Suction available Patient Re-evaluated:Patient Re-evaluated prior to inductionOxygen Delivery Method: Circle system utilized Preoxygenation: Pre-oxygenation with 100% oxygen Intubation Type: IV induction Ventilation: Mask ventilation without difficulty LMA: LMA inserted LMA Size: 3.5 Tube type: Oral Number of attempts: 1 Placement Confirmation: positive ETCO2 and breath sounds checked- equal and bilateral Tube secured with: Tape Dental Injury: Teeth and Oropharynx as per pre-operative assessment

## 2015-10-05 NOTE — Transfer of Care (Signed)
Immediate Anesthesia Transfer of Care Note  Patient: Lindsay [name]  Procedure(s) Performed: Procedure(s): ARTHROSCOPY KNEE, partial lateral menisectomy (Right)  Patient Location: PACU  Anesthesia Type:General  Level of Consciousness: awake, alert  and oriented  Airway & Oxygen Therapy: Patient Spontanous Breathing and Patient connected to nasal cannula oxygen  Post-op Assessment: Report given to RN and Post -op Vital signs reviewed and stable  Post vital signs: Reviewed and stable  Last Vitals:  Filed Vitals:   10/05/15 0618  BP: 166/91  Pulse: 64  Temp: 37 C  Resp: 16    Complications: No apparent anesthesia complications

## 2015-10-05 NOTE — Op Note (Signed)
10/05/2015  9:16 AM  PATIENT:  Lindsay [name]    PRE-OPERATIVE DIAGNOSIS:  TEAR OF LATERAL MENISCUS right knee  POST-OPERATIVE DIAGNOSIS:  Same with grade IV chondromalacia and loose bodies  PROCEDURE:  ARTHROSCOPY KNEE, partial lateral menisectomy, loose body removals   SURGEON:  Chaniqua Brisby E, MD  COMPLICATIONS:   None   EBL:  Minimal   TOURNIQUET TIME:   42 min  ANESTHESIA:  General LMA  PREOPERATIVE INDICATIONS:  Lindsay [name] is a  74 y.o. female with a diagnosis of TEAR OF LATERAL MENISCUS right knee who failed conservative measures and elected for surgical management.    The risks benefits and alternatives were discussed with the patient preoperatively including but not limited to the risks of infection, bleeding, nerve injury, cardiopulmonary complications, the need for revision surgery, among others, and the patient was willing to proceed.  OPERATIVE IMPLANTS: None  OPERATIVE FINDINGS: Complex tear of lateral meniscus. Grade 4 chondromalacia posterior tibia laterally. Extensive synovitis. Multiple loose bodies in the lateral gutter. Patellar chondromalacia.  OPERATIVE PROCEDURE: The patient was brought to the operating room and underwent satisfactory general LMA anesthesia in the supine position.  The leg was prepped and draped in a sterile fashion.  Arthroscopy was carried out through standard portals.  The above findings were encountered upon arthroscopy.  Motorized resector was used to remove anterior synovium. The synovium was very vascular and bled profusely. In addition anesthesia did not keep the blood pressure down adequately. An Esmarch was applied the leg and tourniquet inflated to 350 mmHg. Visualization was much improved. The medial compartment was normal with an intact meniscus and very little fraying of the cartilage. The intercondylar notch was normal with an intact anterior and posterior cruciate ligament. The patella showed grade 3 chondromalacia and was  gently shaved. Loose bodies in the lateral gutter were removed using a motorized resector. The lateral compartment showed a macerated lateral meniscus from anterior to posterior laterally. This was resected with basket forceps, motorized resector and ArthroCare wand.  Chondromalacia on the femur and tibia was gently debrided. The joint was thoroughly flushed and all loose bodies or visible removed and all loose bodies that were visible were removed. Once this was completed and stabilized the joint was thoroughly irrigated.  Instruments were removed and knee wounds closed with 3-0 nylon.  Sponge and needle counts were correct. A dry sterile dressing was applied.  Tourniquet was deflated with good return of blood flow to the foot. Patient was awakened and taken to recovery in good condition.   Park Breed, MD

## 2015-10-05 NOTE — Anesthesia Postprocedure Evaluation (Signed)
  Anesthesia Post-op Note  Patient: Lindsay [name]  Procedure(s) Performed: Procedure(s): ARTHROSCOPY KNEE, partial lateral menisectomy (Right)  Anesthesia type:General  Patient location: PACU  Post pain: Pain level controlled  Post assessment: Post-op Vital signs reviewed, Patient's Cardiovascular Status Stable, Respiratory Function Stable, Patent Airway and No signs of Nausea or vomiting  Post vital signs: Reviewed and stable  Last Vitals:  Filed Vitals:   10/05/15 1137  BP: 154/84  Pulse: 68  Temp: 36.2 C  Resp: 16    Level of consciousness: awake, alert  and patient cooperative  Complications: No apparent anesthesia complications

## 2015-10-05 NOTE — Anesthesia Preprocedure Evaluation (Signed)
Anesthesia Evaluation  Patient identified by MRN, date of birth, ID band Patient awake    Reviewed: Allergy & Precautions, NPO status , Patient's Chart, lab work & pertinent test results, reviewed documented beta blocker date and time   Airway Mallampati: II  TM Distance: <3 FB Neck ROM: Full    Dental  (+) Chipped   Pulmonary Current Smoker,    Pulmonary exam normal breath sounds clear to auscultation       Cardiovascular hypertension, Pt. on medications and Pt. on home beta blockers Normal cardiovascular exam     Neuro/Psych Anxiety Depression    GI/Hepatic GERD  Medicated and Controlled,  Endo/Other  Hypothyroidism   Renal/GU      Musculoskeletal  (+) Arthritis , Osteoarthritis,    Abdominal Normal abdominal exam  (+)   Peds negative pediatric ROS (+)  Hematology  (+) anemia ,   Anesthesia Other Findings   Reproductive/Obstetrics                             Anesthesia Physical Anesthesia Plan  ASA: III  Anesthesia Plan: General   Post-op Pain Management:    Induction: Intravenous  Airway Management Planned: LMA  Additional Equipment:   Intra-op Plan:   Post-operative Plan: Extubation in OR  Informed Consent: I have reviewed the patients History and Physical, chart, labs and discussed the procedure including the risks, benefits and alternatives for the proposed anesthesia with the patient or authorized representative who has indicated his/her understanding and acceptance.   Dental advisory given  Plan Discussed with: CRNA and Surgeon  Anesthesia Plan Comments:         Anesthesia Quick Evaluation

## 2015-10-05 NOTE — H&P (Signed)
THE PATIENT WAS SEEN IN THE HOLDING AREA.  HISTORY, ALLERGIES, HOME MEDICATIONS AND OPERATIVE PROCEDURE WERE REVIEWED. RISKS AND BENEFITS OF SURGERY DISCUSSED WITH PATIENT AGAIN.  NO CHANGES FROM INITIAL HISTORY AND PHYSICAL NOTED.    

## 2015-10-05 NOTE — Discharge Instructions (Signed)
Knee arthroscopy

## 2015-10-05 NOTE — Progress Notes (Signed)
Pt. Complaining of nausea while getting dressed , zofran adm . See [facility]

## 2015-10-17 ENCOUNTER — Telehealth: Payer: Self-pay | Admitting: *Deleted

## 2015-10-17 DIAGNOSIS — Z780 Asymptomatic menopausal state: Secondary | ICD-10-CM

## 2015-10-17 NOTE — Telephone Encounter (Signed)
Fine to order a Dexa scan if she would like to have one

## 2015-10-17 NOTE — Telephone Encounter (Signed)
Patient stated she is okay with having that ordered.

## 2015-10-17 NOTE — Telephone Encounter (Signed)
Cindy from [facility] called with question, if patient would be a canidate for osteoporosis screening .

## 2015-10-26 ENCOUNTER — Other Ambulatory Visit: Payer: Self-pay

## 2015-11-17 ENCOUNTER — Encounter: Payer: Self-pay | Admitting: Internal Medicine

## 2015-11-17 ENCOUNTER — Ambulatory Visit (INDEPENDENT_AMBULATORY_CARE_PROVIDER_SITE_OTHER): Payer: Medicare Other | Admitting: Internal Medicine

## 2015-11-17 VITALS — BP 115/73 | HR 69 | Temp 98.0°F | Ht 65.0 in | Wt 156.5 lb

## 2015-11-17 DIAGNOSIS — B9789 Other viral agents as the cause of diseases classified elsewhere: Principal | ICD-10-CM

## 2015-11-17 DIAGNOSIS — J069 Acute upper respiratory infection, unspecified: Secondary | ICD-10-CM | POA: Insufficient documentation

## 2015-11-17 MED ORDER — HYDROCOD POLST-CPM POLST ER 10-8 MG/5ML PO SUER: 5.0000 mL | Freq: Two times a day (BID) | ORAL | Status: DC | PRN

## 2015-11-17 NOTE — Progress Notes (Signed)
Pre visit review using our clinic review tool, if applicable. No additional management support is needed unless otherwise documented below in the visit note. 

## 2015-11-17 NOTE — Patient Instructions (Signed)
Start Tussionex up to twice daily as needed for cough. Rest. Make sure you are taking in fluids. Follow up if any shortness of breath, worsening cough, fever or other concerns.  Upper Respiratory Infection, Adult Most upper respiratory infections (URIs) are a viral infection of the air passages leading to the lungs. A URI affects the nose, throat, and upper air passages. The most common type of URI is nasopharyngitis and is typically referred to as "the common cold." URIs run their course and usually go away on their own. Most of the time, a URI does not require medical attention, but sometimes a bacterial infection in the upper airways can follow a viral infection. This is called a secondary infection. Sinus and middle ear infections are common types of secondary upper respiratory infections. Bacterial pneumonia can also complicate a URI. A URI can worsen asthma and chronic obstructive pulmonary disease (COPD). Sometimes, these complications can require emergency medical care and may be life threatening.  CAUSES Almost all URIs are caused by viruses. A virus is a type of germ and can spread from one person to another.  RISKS FACTORS You may be at risk for a URI if:   You smoke.   You have chronic heart or lung disease.  You have a weakened defense (immune) system.   You are very young or very old.   You have nasal allergies or asthma.  You work in crowded or poorly ventilated areas.  You work in health care facilities or schools. SIGNS AND SYMPTOMS  Symptoms typically develop 2-3 days after you come in contact with a cold virus. Most viral URIs last 7-10 days. However, viral URIs from the influenza virus (flu virus) can last 14-18 days and are typically more severe. Symptoms may include:   Runny or stuffy (congested) nose.   Sneezing.   Cough.   Sore throat.   Headache.   Fatigue.   Fever.   Loss of appetite.   Pain in your forehead, behind your eyes, and over  your cheekbones (sinus pain).  Muscle aches.  DIAGNOSIS  Your health care provider may diagnose a URI by:  Physical exam.  Tests to check that your symptoms are not due to another condition such as:  Strep throat.  Sinusitis.  Pneumonia.  Asthma. TREATMENT  A URI goes away on its own with time. It cannot be cured with medicines, but medicines may be prescribed or recommended to relieve symptoms. Medicines may help:  Reduce your fever.  Reduce your cough.  Relieve nasal congestion. HOME CARE INSTRUCTIONS   Take medicines only as directed by your health care provider.   Gargle warm saltwater or take cough drops to comfort your throat as directed by your health care provider.  Use a warm mist humidifier or inhale steam from a shower to increase air moisture. This may make it easier to breathe.  Drink enough fluid to keep your urine clear or pale yellow.   Eat soups and other clear broths and maintain good nutrition.   Rest as needed.   Return to work when your temperature has returned to normal or as your health care provider advises. You may need to stay home longer to avoid infecting others. You can also use a face mask and careful hand washing to prevent spread of the virus.  Increase the usage of your inhaler if you have asthma.   Do not use any tobacco products, including cigarettes, chewing tobacco, or electronic cigarettes. If you need help quitting, ask  your health care provider. PREVENTION  The best way to protect yourself from getting a cold is to practice good hygiene.   Avoid oral or hand contact with people with cold symptoms.   Wash your hands often if contact occurs.  There is no clear evidence that vitamin C, vitamin E, echinacea, or exercise reduces the chance of developing a cold. However, it is always recommended to get plenty of rest, exercise, and practice good nutrition.  SEEK MEDICAL CARE IF:   You are getting worse rather than better.    Your symptoms are not controlled by medicine.   You have chills.  You have worsening shortness of breath.  You have brown or red mucus.  You have yellow or brown nasal discharge.  You have pain in your face, especially when you bend forward.  You have a fever.  You have swollen neck glands.  You have pain while swallowing.  You have white areas in the back of your throat. SEEK IMMEDIATE MEDICAL CARE IF:   You have severe or persistent:  Headache.  Ear pain.  Sinus pain.  Chest pain.  You have chronic lung disease and any of the following:  Wheezing.  Prolonged cough.  Coughing up blood.  A change in your usual mucus.  You have a stiff neck.  You have changes in your:  Vision.  Hearing.  Thinking.  Mood. MAKE SURE YOU:   Understand these instructions.  Will watch your condition.  Will get help right away if you are not doing well or get worse.   This information is not intended to replace advice given to you by your health care provider. Make sure you discuss any questions you have with your health care provider.   Document Released: 05/15/2001 Document Revised: 04/05/2015 Document Reviewed: 02/24/2014 Elsevier Interactive Patient Education [employer].

## 2015-11-17 NOTE — Progress Notes (Signed)
Subjective:    Patient ID: Lindsay [name], female    DOB: 1941/07/27, 74 y.o.   MRN: [id number]  HPI  74YO female presents for acute visit.  Congestion started Saturday. Cough started Tuesday. Notes purulent drainage from nose. No dyspnea. Cough occasionally productive. Mucous is clear. No fever. Not taking any medication for symptoms.   Wt Readings from Last 3 Encounters:  11/17/15 156 lb 8 oz (70.988 kg)  10/05/15 165 lb (74.844 kg)  08/17/15 160 lb (72.576 kg)   BP Readings from Last 3 Encounters:  11/17/15 115/73  10/05/15 154/84  08/17/15 126/82    Past Medical History  Diagnosis Date  . Hypothyroidism   . Depression   . Rectal bleeding   . Anemia   . Diverticulosis   . Hematochezia   . Anxiety   . Anemia   . Arthritis     osteo, back  . Hypertension     controlled on meds  . GERD (gastroesophageal reflux disease)    Family History  Problem Relation Age of Onset  . Diabetes Mother   . Hypertension Mother   . Diabetes Sister   . Hypertension Sister    Past Surgical History  Procedure Laterality Date  . Abdominal hysterectomy    . Foot surgery    . Colonoscopy  12/29/08  . Wrist surgery    . Cardiac catheterization  1995    normal, Dr Clayborn Bigness  . Colonoscopy N/A 04/18/2015    Procedure: COLONOSCOPY;  Surgeon: Lucilla Lame, MD;  Location: [place];  Service: Gastroenterology;  Laterality: N/A;  cecum time- 0957  . Polypectomy  04/18/2015    Procedure: POLYPECTOMY INTESTINAL;  Surgeon: Lucilla Lame, MD;  Location: [place];  Service: Gastroenterology;;  . Knee arthroscopy Right 10/05/2015    Procedure: ARTHROSCOPY KNEE, partial lateral menisectomy;  Surgeon: Earnestine Leys, MD;  Location: [facility];  Service: Orthopedics;  Laterality: Right;   Social History   Social History  . Marital Status: Married    Spouse Name: N/A  . Number of Children: N/A  . Years of Education: N/A   Social History Main Topics  . Smoking status: Current  Every Day Smoker -- 0.50 packs/day for 30 years    Types: Cigarettes  . Smokeless tobacco: Never Used  . Alcohol Use: 0.0 oz/week    0 Standard drinks or equivalent per week     Comment: occassionally/ whiskey on weekend  . Drug Use: No  . Sexual Activity: Not Asked   Other Topics Concern  . None   Social History Narrative    Review of Systems  Constitutional: Positive for fatigue. Negative for fever, chills and unexpected weight change.  HENT: Positive for congestion, postnasal drip and rhinorrhea. Negative for ear discharge, ear pain, facial swelling, hearing loss, mouth sores, nosebleeds, sinus pressure, sneezing, sore throat, tinnitus, trouble swallowing and voice change.   Eyes: Negative for pain, discharge, redness and visual disturbance.  Respiratory: Positive for cough. Negative for chest tightness, shortness of breath, wheezing and stridor.   Cardiovascular: Negative for chest pain, palpitations and leg swelling.  Musculoskeletal: Negative for myalgias, arthralgias, neck pain and neck stiffness.  Skin: Negative for color change and rash.  Neurological: Negative for dizziness, weakness, light-headedness and headaches.  Hematological: Negative for adenopathy.  Psychiatric/Behavioral: Positive for sleep disturbance.       Objective:    BP 115/73 mmHg  Pulse 69  Temp(Src) 98 F (36.7 C) (Oral)  Ht 5\' 5"  (1.651 m)  Wt 156 lb 8 oz (70.988 kg)  BMI 26.04 kg/m2  SpO2 98% Physical Exam  Constitutional: She is oriented to person, place, and time. She appears well-developed and well-nourished. No distress.  HENT:  Head: Normocephalic and atraumatic.  Right Ear: External ear normal.  Left Ear: External ear normal.  Nose: Nose normal.  Mouth/Throat: Oropharynx is clear and moist. No oropharyngeal exudate.  Eyes: Conjunctivae are normal. Pupils are equal, round, and reactive to light. Right eye exhibits no discharge. Left eye exhibits no discharge. No scleral icterus.    Neck: Normal range of motion. Neck supple. No tracheal deviation present. No thyromegaly present.  Cardiovascular: Normal rate, regular rhythm, normal heart sounds and intact distal pulses.  Exam reveals no gallop and no friction rub.   No murmur heard. Pulmonary/Chest: Effort normal. No accessory muscle usage. No tachypnea. No respiratory distress. She has no decreased breath sounds. She has no wheezes. She has rhonchi (few scattered). She has no rales. She exhibits no tenderness.  Musculoskeletal: Normal range of motion. She exhibits no edema or tenderness.  Lymphadenopathy:    She has no cervical adenopathy.  Neurological: She is alert and oriented to person, place, and time. No cranial nerve deficit. She exhibits normal muscle tone. Coordination normal.  Skin: Skin is warm and dry. No rash noted. She is not diaphoretic. No erythema. No pallor.  Psychiatric: She has a normal mood and affect. Her behavior is normal. Judgment and thought content normal.          Assessment & Plan:   Problem List Items Addressed This Visit      Unprioritized   Viral URI with cough - Primary    Symptoms are most consistent with viral URI. Encouraged rest, supportive care. Will add Tussionex at night as needed. Follow up if symptoms are not improving, if any fever, chills, worsening cough, dyspnea or other concerns.      Relevant Medications   chlorpheniramine-HYDROcodone (TUSSIONEX PENNKINETIC ER) 10-8 MG/5ML SUER       Return if symptoms worsen or fail to improve, for Recheck.

## 2015-11-17 NOTE — Assessment & Plan Note (Signed)
Symptoms are most consistent with viral URI. Encouraged rest, supportive care. Will add Tussionex at night as needed. Follow up if symptoms are not improving, if any fever, chills, worsening cough, dyspnea or other concerns.

## 2015-11-24 ENCOUNTER — Ambulatory Visit: Payer: Medicare Other

## 2015-12-06 DIAGNOSIS — S83271D Complex tear of lateral meniscus, current injury, right knee, subsequent encounter: Secondary | ICD-10-CM | POA: Diagnosis not present

## 2015-12-08 DIAGNOSIS — E2839 Other primary ovarian failure: Secondary | ICD-10-CM | POA: Diagnosis not present

## 2015-12-08 DIAGNOSIS — Z13820 Encounter for screening for osteoporosis: Secondary | ICD-10-CM | POA: Diagnosis not present

## 2015-12-08 DIAGNOSIS — M85862 Other specified disorders of bone density and structure, left lower leg: Secondary | ICD-10-CM | POA: Diagnosis not present

## 2015-12-08 DIAGNOSIS — M85859 Other specified disorders of bone density and structure, unspecified thigh: Secondary | ICD-10-CM | POA: Diagnosis not present

## 2015-12-08 LAB — HM DEXA SCAN

## 2016-01-10 ENCOUNTER — Encounter: Payer: Self-pay | Admitting: *Deleted

## 2016-01-17 DIAGNOSIS — M1711 Unilateral primary osteoarthritis, right knee: Secondary | ICD-10-CM | POA: Diagnosis not present

## 2016-01-17 DIAGNOSIS — M2390 Unspecified internal derangement of unspecified knee: Secondary | ICD-10-CM | POA: Insufficient documentation

## 2016-02-13 ENCOUNTER — Other Ambulatory Visit: Payer: Self-pay | Admitting: Internal Medicine

## 2016-05-14 ENCOUNTER — Other Ambulatory Visit: Payer: Self-pay | Admitting: Internal Medicine

## 2016-05-16 ENCOUNTER — Other Ambulatory Visit: Payer: Self-pay | Admitting: Internal Medicine

## 2016-05-22 DIAGNOSIS — M1711 Unilateral primary osteoarthritis, right knee: Secondary | ICD-10-CM | POA: Diagnosis not present

## 2016-06-11 DIAGNOSIS — B009 Herpesviral infection, unspecified: Secondary | ICD-10-CM | POA: Diagnosis not present

## 2016-06-11 DIAGNOSIS — R21 Rash and other nonspecific skin eruption: Secondary | ICD-10-CM | POA: Diagnosis not present

## 2016-06-18 ENCOUNTER — Ambulatory Visit (INDEPENDENT_AMBULATORY_CARE_PROVIDER_SITE_OTHER): Payer: Medicare HMO | Admitting: Internal Medicine

## 2016-06-18 VITALS — BP 126/68 | HR 74 | Ht 65.0 in | Wt 157.0 lb

## 2016-06-18 DIAGNOSIS — E039 Hypothyroidism, unspecified: Secondary | ICD-10-CM | POA: Diagnosis not present

## 2016-06-18 DIAGNOSIS — M25561 Pain in right knee: Secondary | ICD-10-CM

## 2016-06-18 DIAGNOSIS — Z1239 Encounter for other screening for malignant neoplasm of breast: Secondary | ICD-10-CM | POA: Diagnosis not present

## 2016-06-18 DIAGNOSIS — I10 Essential (primary) hypertension: Secondary | ICD-10-CM | POA: Diagnosis not present

## 2016-06-18 LAB — COMPREHENSIVE METABOLIC PANEL
ALT: 16 U/L (ref 0–35)
AST: 18 U/L (ref 0–37)
Albumin: 4.2 g/dL (ref 3.5–5.2)
Alkaline Phosphatase: 51 U/L (ref 39–117)
BILIRUBIN TOTAL: 0.4 mg/dL (ref 0.2–1.2)
BUN: 14 mg/dL (ref 6–23)
CALCIUM: 9.7 mg/dL (ref 8.4–10.5)
CHLORIDE: 105 meq/L (ref 96–112)
CO2: 28 meq/L (ref 19–32)
Creatinine, Ser: 0.73 mg/dL (ref 0.40–1.20)
GFR: 100.46 mL/min (ref >60.00)
GLUCOSE: 122 mg/dL — AB (ref 70–99)
POTASSIUM: 4.4 meq/L (ref 3.5–5.1)
Sodium: 141 mEq/L (ref 135–145)
Total Protein: 7.4 g/dL (ref 6.0–8.3)

## 2016-06-18 LAB — TSH: TSH: 0.77 u[IU]/mL (ref 0.35–4.50)

## 2016-06-18 MED ORDER — PANTOPRAZOLE SODIUM 40 MG PO TBEC: DELAYED_RELEASE_TABLET | ORAL | Status: DC

## 2016-06-18 MED ORDER — BISOPROLOL-HYDROCHLOROTHIAZIDE 10-6.25 MG PO TABS: ORAL_TABLET | ORAL | Status: DC

## 2016-06-18 MED ORDER — LOSARTAN POTASSIUM 50 MG PO TABS: ORAL_TABLET | ORAL | Status: DC

## 2016-06-18 MED ORDER — ALPRAZOLAM 0.25 MG PO TABS: 0.2500 mg | ORAL_TABLET | Freq: Two times a day (BID) | ORAL | Status: DC | PRN

## 2016-06-18 MED ORDER — LEVOTHYROXINE SODIUM 50 MCG PO TABS: ORAL_TABLET | ORAL | Status: DC

## 2016-06-18 NOTE — Assessment & Plan Note (Signed)
Will check TSH with labs. Continue Levothyroxine. 

## 2016-06-18 NOTE — Progress Notes (Signed)
Subjective:    Patient ID: Lindsay [name], female    DOB: 1941-04-03, 75 y.o.   MRN: [id number]  HPI  75YO female presents for follow up.  Right knee pain - Had arthroscopy with Dr. Sabra Heck. No improvement in pain symptoms. Not taking anything for pain right now. Plans to follow up with him.  HTN - Does not check BP. No CP, HA, palpitations. Compliant with medication.  No recent GI bleeding. Feeling well. No fatigue, temp intolerance.  Wt Readings from Last 3 Encounters:  06/18/16 157 lb (71.215 kg)  11/17/15 156 lb 8 oz (70.988 kg)  10/05/15 165 lb (74.844 kg)   BP Readings from Last 3 Encounters:  06/18/16 126/68  11/17/15 115/73  10/05/15 154/84    Past Medical History  Diagnosis Date  . Hypothyroidism   . Depression   . Rectal bleeding   . Anemia   . Diverticulosis   . Hematochezia   . Anxiety   . Anemia   . Arthritis     osteo, back  . Hypertension     controlled on meds  . GERD (gastroesophageal reflux disease)    Family History  Problem Relation Age of Onset  . Diabetes Mother   . Hypertension Mother   . Diabetes Sister   . Hypertension Sister    Past Surgical History  Procedure Laterality Date  . Abdominal hysterectomy    . Foot surgery    . Colonoscopy  12/29/08  . Wrist surgery    . Cardiac catheterization  1995    normal, Dr Clayborn Bigness  . Colonoscopy N/A 04/18/2015    Procedure: COLONOSCOPY;  Surgeon: Lucilla Lame, MD;  Location: [place];  Service: Gastroenterology;  Laterality: N/A;  cecum time- 0957  . Polypectomy  04/18/2015    Procedure: POLYPECTOMY INTESTINAL;  Surgeon: Lucilla Lame, MD;  Location: [place];  Service: Gastroenterology;;  . Knee arthroscopy Right 10/05/2015    Procedure: ARTHROSCOPY KNEE, partial lateral menisectomy;  Surgeon: Earnestine Leys, MD;  Location: [facility];  Service: Orthopedics;  Laterality: Right;   Social History   Social History  . Marital Status: Married    Spouse Name: N/A  . Number of  Children: N/A  . Years of Education: N/A   Social History Main Topics  . Smoking status: Current Every Day Smoker -- 0.50 packs/day for 30 years    Types: Cigarettes  . Smokeless tobacco: Never Used  . Alcohol Use: 0.0 oz/week    0 Standard drinks or equivalent per week     Comment: occassionally/ whiskey on weekend  . Drug Use: No  . Sexual Activity: Not on file   Other Topics Concern  . Not on file   Social History Narrative    Review of Systems  Constitutional: Negative for fever, chills, appetite change, fatigue and unexpected weight change.  Eyes: Negative for visual disturbance.  Respiratory: Negative for shortness of breath.   Cardiovascular: Negative for chest pain and leg swelling.  Gastrointestinal: Negative for nausea, vomiting, abdominal pain, diarrhea and constipation.  Musculoskeletal: Positive for myalgias and arthralgias. Negative for gait problem.  Skin: Negative for color change and rash.  Hematological: Negative for adenopathy. Does not bruise/bleed easily.  Psychiatric/Behavioral: Negative for suicidal ideas, sleep disturbance and dysphoric mood. The patient is not nervous/anxious.        Objective:    BP 126/68 mmHg  Pulse 74  Ht 5\' 5"  (1.651 m)  Wt 157 lb (71.215 kg)  BMI 26.13 kg/m2  SpO2 99% Physical Exam  Constitutional: She is oriented to person, place, and time. She appears well-developed and well-nourished. No distress.  HENT:  Head: Normocephalic and atraumatic.  Right Ear: External ear normal.  Left Ear: External ear normal.  Nose: Nose normal.  Mouth/Throat: Oropharynx is clear and moist. No oropharyngeal exudate.  Eyes: Conjunctivae are normal. Pupils are equal, round, and reactive to light. Right eye exhibits no discharge. Left eye exhibits no discharge. No scleral icterus.  Neck: Normal range of motion. Neck supple. No tracheal deviation present. No thyromegaly present.  Cardiovascular: Normal rate, regular rhythm, normal heart  sounds and intact distal pulses.  Exam reveals no gallop and no friction rub.   No murmur heard. Pulmonary/Chest: Effort normal and breath sounds normal. No respiratory distress. She has no wheezes. She has no rales. She exhibits no tenderness.  Musculoskeletal: Normal range of motion. She exhibits no edema or tenderness.       Right knee: She exhibits normal range of motion and no swelling. No tenderness found.  Lymphadenopathy:    She has no cervical adenopathy.  Neurological: She is alert and oriented to person, place, and time. No cranial nerve deficit. She exhibits normal muscle tone. Coordination normal.  Skin: Skin is warm and dry. No rash noted. She is not diaphoretic. No erythema. No pallor.  Psychiatric: She has a normal mood and affect. Her behavior is normal. Judgment and thought content normal.          Assessment & Plan:   Problem List Items Addressed This Visit      Unprioritized   Hypertension - Primary (Chronic)    BP Readings from Last 3 Encounters:  06/18/16 126/68  11/17/15 115/73  10/05/15 154/84   BP well controlled. Renal function with labs. Continue current medications.      Relevant Medications   bisoprolol-hydrochlorothiazide (ZIAC) 10-6.25 MG tablet   losartan (COZAAR) 50 MG tablet   Other Relevant Orders   Comprehensive metabolic panel   Hypothyroidism (Chronic)    Will check TSH with labs. Continue Levothyroxine.      Relevant Medications   levothyroxine (SYNTHROID, LEVOTHROID) 50 MCG tablet   Other Relevant Orders   TSH   Right knee pain    Right knee OA. Encouraged follow up with ortho to consider Synvisc.      Screening for breast cancer   Relevant Orders   MM Digital Screening       Return in about 4 weeks (around 07/16/2016) for New Patient.  Ronette Deter, MD [facility]

## 2016-06-18 NOTE — Patient Instructions (Signed)
Labs today.  We will schedule your mammogram.

## 2016-06-18 NOTE — Assessment & Plan Note (Signed)
BP Readings from Last 3 Encounters:  06/18/16 126/68  11/17/15 115/73  10/05/15 154/84   BP well controlled. Renal function with labs. Continue current medications.

## 2016-06-18 NOTE — Progress Notes (Signed)
Pre visit review using our clinic review tool, if applicable. No additional management support is needed unless otherwise documented below in the visit note. 

## 2016-06-18 NOTE — Assessment & Plan Note (Signed)
Right knee OA. Encouraged follow up with ortho to consider Synvisc.

## 2016-06-27 ENCOUNTER — Telehealth: Payer: Self-pay | Admitting: *Deleted

## 2016-06-27 NOTE — Telephone Encounter (Signed)
Reviewed results that were sent via mychart to patient, thanks

## 2016-06-27 NOTE — Telephone Encounter (Signed)
Patient can be reached at [phone]

## 2016-06-27 NOTE — Telephone Encounter (Signed)
Pt requested lab results. 

## 2016-06-27 NOTE — Telephone Encounter (Signed)
Attempted to reach patient, left VM. thanks

## 2016-08-02 DIAGNOSIS — I10 Essential (primary) hypertension: Secondary | ICD-10-CM | POA: Diagnosis not present

## 2016-08-02 DIAGNOSIS — K219 Gastro-esophageal reflux disease without esophagitis: Secondary | ICD-10-CM | POA: Diagnosis not present

## 2016-08-02 DIAGNOSIS — E039 Hypothyroidism, unspecified: Secondary | ICD-10-CM | POA: Diagnosis not present

## 2016-08-02 DIAGNOSIS — Z0000 Encounter for general adult medical examination without abnormal findings: Secondary | ICD-10-CM | POA: Diagnosis not present

## 2016-08-09 ENCOUNTER — Telehealth: Payer: Self-pay | Admitting: Family

## 2016-08-09 ENCOUNTER — Ambulatory Visit (INDEPENDENT_AMBULATORY_CARE_PROVIDER_SITE_OTHER): Payer: Medicare HMO | Admitting: Family

## 2016-08-09 ENCOUNTER — Encounter: Payer: Self-pay | Admitting: Family

## 2016-08-09 VITALS — BP 160/68 | HR 70 | Temp 98.1°F | Ht 64.0 in | Wt 159.4 lb

## 2016-08-09 DIAGNOSIS — E039 Hypothyroidism, unspecified: Secondary | ICD-10-CM | POA: Diagnosis not present

## 2016-08-09 DIAGNOSIS — I10 Essential (primary) hypertension: Secondary | ICD-10-CM

## 2016-08-09 DIAGNOSIS — F17200 Nicotine dependence, unspecified, uncomplicated: Secondary | ICD-10-CM

## 2016-08-09 DIAGNOSIS — Z23 Encounter for immunization: Secondary | ICD-10-CM

## 2016-08-09 DIAGNOSIS — M25561 Pain in right knee: Secondary | ICD-10-CM

## 2016-08-09 DIAGNOSIS — R69 Illness, unspecified: Secondary | ICD-10-CM | POA: Diagnosis not present

## 2016-08-09 DIAGNOSIS — Z1239 Encounter for other screening for malignant neoplasm of breast: Secondary | ICD-10-CM

## 2016-08-09 DIAGNOSIS — F419 Anxiety disorder, unspecified: Secondary | ICD-10-CM

## 2016-08-09 LAB — COMPREHENSIVE METABOLIC PANEL
ALT: 20 U/L (ref 0–35)
AST: 20 U/L (ref 0–37)
Albumin: 4.1 g/dL (ref 3.5–5.2)
Alkaline Phosphatase: 53 U/L (ref 39–117)
BILIRUBIN TOTAL: 0.4 mg/dL (ref 0.2–1.2)
BUN: 11 mg/dL (ref 6–23)
CHLORIDE: 106 meq/L (ref 96–112)
CO2: 31 meq/L (ref 19–32)
Calcium: 9 mg/dL (ref 8.4–10.5)
Creatinine, Ser: 0.68 mg/dL (ref 0.40–1.20)
GFR: 108.99 mL/min (ref >60.00)
GLUCOSE: 127 mg/dL — AB (ref 70–99)
Potassium: 3.6 mEq/L (ref 3.5–5.1)
Sodium: 143 mEq/L (ref 135–145)
Total Protein: 7.2 g/dL (ref 6.0–8.3)

## 2016-08-09 LAB — TSH: TSH: 0.73 u[IU]/mL (ref 0.35–4.50)

## 2016-08-09 MED ORDER — DICLOFENAC SODIUM 1 % TD GEL: 4.0000 g | Freq: Four times a day (QID) | TRANSDERMAL | 3 refills | Status: DC

## 2016-08-09 MED ORDER — DULOXETINE HCL 20 MG PO CPEP: ORAL_CAPSULE | ORAL | 2 refills | Status: DC

## 2016-08-09 MED ORDER — LOSARTAN POTASSIUM 100 MG PO TABS: ORAL_TABLET | ORAL | 3 refills | Status: DC

## 2016-08-09 NOTE — Assessment & Plan Note (Signed)
No interest in changing smoking habits at this time.

## 2016-08-09 NOTE — Assessment & Plan Note (Signed)
Uncontrolled pain. Trial of cymbalta and voltaren gel. Close follow up. Will consider PT at f/u.

## 2016-08-09 NOTE — Progress Notes (Signed)
Pre visit review using our clinic review tool, if applicable. No additional management support is needed unless otherwise documented below in the visit note. 

## 2016-08-09 NOTE — Assessment & Plan Note (Signed)
Discussed risks of BZDs with age. She admitts to drinking alcohol, occasional beer and whiskey not totaling more than 4 drinks per week. I would feel more comfortable if she was not on a BZD and we jointly agreed that she may find more optimal control of GAD, OA, and neuropathic pain from cymbalta. Close follow up.

## 2016-08-09 NOTE — Patient Instructions (Addendum)
Please make nurse visit to check BP next week OR you can check at home and call me with results.   Please make appointment in 1-2 months to follow-up on new medication that I started for you. We will do your shingles and tetanus vaccine at that time as well.   Managing Your High Blood Pressure Blood pressure is a measurement of how forceful your blood is pressing against the walls of the arteries. Arteries are muscular tubes within the circulatory system. Blood pressure does not stay the same. Blood pressure rises when you are active, excited, or nervous; and it lowers during sleep and relaxation. If the numbers measuring your blood pressure stay above normal most of the time, you are at risk for health problems. High blood pressure (hypertension) is a long-term (chronic) condition in which blood pressure is elevated. A blood pressure reading is recorded as two numbers, such as 120 over 80 (or 120/80). The first, higher number is called the systolic pressure. It is a measure of the pressure in your arteries as the heart beats. The second, lower number is called the diastolic pressure. It is a measure of the pressure in your arteries as the heart relaxes between beats.  Keeping your blood pressure in a normal range is important to your overall health and prevention of health problems, such as heart disease and stroke. When your blood pressure is uncontrolled, your heart has to work harder than normal. High blood pressure is a very common condition in adults because blood pressure tends to rise with age. Men and women are equally likely to have hypertension but at different times in life. Before age 57, men are more likely to have hypertension. After 73 years of age, women are more likely to have it. Hypertension is especially common in African Americans. This condition often has no signs or symptoms. The cause of the condition is usually not known. Your caregiver can help you come up with a plan to keep your  blood pressure in a normal, healthy range. BLOOD PRESSURE STAGES Blood pressure is classified into four stages: normal, prehypertension, stage 1, and stage 2. Your blood pressure reading will be used to determine what type of treatment, if any, is necessary. Appropriate treatment options are tied to these four stages:  Normal  Systolic pressure (mm Hg): below 120.  Diastolic pressure (mm Hg): below 80. Prehypertension  Systolic pressure (mm Hg): 120 to 139.  Diastolic pressure (mm Hg): 80 to 89. Stage1  Systolic pressure (mm Hg): 140 to 159.  Diastolic pressure (mm Hg): 90 to 99. Stage2  Systolic pressure (mm Hg): 160 or above.  Diastolic pressure (mm Hg): 100 or above. RISKS RELATED TO HIGH BLOOD PRESSURE Managing your blood pressure is an important responsibility. Uncontrolled high blood pressure can lead to:  A heart attack.  A stroke.  A weakened blood vessel (aneurysm).  Heart failure.  Kidney damage.  Eye damage.  Metabolic syndrome.  Memory and concentration problems. HOW TO MANAGE YOUR BLOOD PRESSURE Blood pressure can be managed effectively with lifestyle changes and medicines (if needed). Your caregiver will help you come up with a plan to bring your blood pressure within a normal range. Your plan should include the following: Education  Read all information provided by your caregivers about how to control blood pressure.  Educate yourself on the latest guidelines and treatment recommendations. New research is always being done to further define the risks and treatments for high blood pressure. Lifestylechanges  Control your weight.  Avoid smoking.  Stay physically active.  Reduce the amount of salt in your diet.  Reduce stress.  Control any chronic conditions, such as high cholesterol or diabetes.  Reduce your alcohol intake. Medicines  Several medicines (antihypertensive medicines) are available, if needed, to bring blood pressure within  a normal range. Communication  Review all the medicines you take with your caregiver because there may be side effects or interactions.  Talk with your caregiver about your diet, exercise habits, and other lifestyle factors that may be contributing to high blood pressure.  See your caregiver regularly. Your caregiver can help you create and adjust your plan for managing high blood pressure. RECOMMENDATIONS FOR TREATMENT AND FOLLOW-UP  The following recommendations are based on current guidelines for managing high blood pressure in nonpregnant adults. Use these recommendations to identify the proper follow-up period or treatment option based on your blood pressure reading. You can discuss these options with your caregiver.  Systolic pressure of 123456 to [id number] or diastolic pressure of 80 to 89: Follow up with your caregiver as directed.  Systolic pressure of [id number] to 0000000 or diastolic pressure of 90 to 100: Follow up with your caregiver within 2 months.  Systolic pressure above 0000000 or diastolic pressure above [id number]: Follow up with your caregiver within 1 month.  Systolic pressure above [id number] or diastolic pressure above [id number]: Consider antihypertensive therapy; follow up with your caregiver within 1 week.  Systolic pressure above [id number] or diastolic pressure above 123456: Begin antihypertensive therapy; follow up with your caregiver within 1 week.   This information is not intended to replace advice given to you by your health care provider. Make sure you discuss any questions you have with your health care provider.   Document Released: 08/13/2012 Document Reviewed: 08/13/2012 Elsevier Interactive Patient Education [employer].

## 2016-08-09 NOTE — Progress Notes (Signed)
Subjective:    Patient ID: Lindsay [name], female    DOB: [birth year], 75 y.o.   MRN: [id number]  CC: Lindsay [name] is a 75 y.o. female who presents today for follow up.   HPI: Here for follow up on chronic disease and to establish care.    Right knee pain- Stopped taking gabapentin for nerve pain however making her sleepy and would like to discuss alternative medication. Describes pain from lateral knee which radiates to right ankle. Worse after sitting. No numbness. No pain radiating from low back. Home visit RN recommended voltaren gel. H/o GIB.   Anxiety- Stable to worsening. Takes half tablet once to twice per week. Driving makes her anxious and she drives daily which is becoming hard for her . No thoughts of hurting herself or anyone else.   Depression- Stable.   HTN- Complaint with medications. Doesn't check medication at home.  Denies exertional chest pain or pressure, numbness or tingling radiating to left arm or jaw,  dizziness, frequent headaches, changes in vision, or shortness of breath.   Smoking- no interest in changing smoking habits at this time.     Tdap-would like to come back  Shingles- would like to come back for shot Mammogram due- will schedule today.  Colonoscopy UTD    HISTORY:  Past Medical History:  Diagnosis Date  . Anemia   . Anemia   . Anxiety   . Arthritis    osteo, back  . Depression   . Diverticulosis   . GERD (gastroesophageal reflux disease)   . Hematochezia   . Hypertension    controlled on meds  . Hypothyroidism   . Rectal bleeding    Past Surgical History:  Procedure Laterality Date  . ABDOMINAL HYSTERECTOMY    . CARDIAC CATHETERIZATION  1995   normal, Dr Clayborn Bigness  . COLONOSCOPY  12/29/08  . COLONOSCOPY N/A 04/18/2015   Procedure: COLONOSCOPY;  Surgeon: Lucilla Lame, MD;  Location: [place];  Service: Gastroenterology;  Laterality: N/A;  cecum time- 0957  . FOOT SURGERY    . KNEE ARTHROSCOPY Right 10/05/2015   Procedure: ARTHROSCOPY KNEE, partial lateral menisectomy;  Surgeon: Earnestine Leys, MD;  Location: [facility];  Service: Orthopedics;  Laterality: Right;  . POLYPECTOMY  04/18/2015   Procedure: POLYPECTOMY INTESTINAL;  Surgeon: Lucilla Lame, MD;  Location: [place];  Service: Gastroenterology;;  . WRIST SURGERY     Family History  Problem Relation Age of Onset  . Diabetes Mother   . Hypertension Mother   . Diabetes Sister   . Hypertension Sister     Allergies: Aspirin; Nsaids; and Penicillins Current Outpatient Prescriptions on File Prior to Visit  Medication Sig Dispense Refill  . bisoprolol-hydrochlorothiazide (ZIAC) 10-6.25 MG tablet TAKE ONE (1) TABLET BY MOUTH EVERY DAY 90 tablet 3  . erythromycin ophthalmic ointment Place 1 application into the left eye at bedtime. 3.5 g 0  . levothyroxine (SYNTHROID, LEVOTHROID) 50 MCG tablet TAKE ONE (1) TABLET BY MOUTH EVERY DAY 90 tablet 3  . Multiple Vitamins-Minerals (MULTI COMPLETE PO) Take 1 tablet by mouth. am    . pantoprazole (PROTONIX) 40 MG tablet TAKE ONE (1) TABLET EACH DAY 90 tablet 3  . valACYclovir (VALTREX) 1000 MG tablet Take 1 tablet (1,000 mg total) by mouth 3 (three) times daily. For 7 days. 21 tablet 0   No current facility-administered medications on file prior to visit.     Social History  Substance Use Topics  . Smoking status: Current  Every Day Smoker    Packs/day: 0.50    Years: 30.00    Types: Cigarettes  . Smokeless tobacco: Never Used  . Alcohol use 0.0 oz/week     Comment: occassionally/ whiskey on weekend; total of 4 drinks per week    Review of Systems  Constitutional: Negative for chills, fever and unexpected weight change.  HENT: Negative for congestion.   Eyes: Negative for visual disturbance.  Respiratory: Negative for cough.   Cardiovascular: Negative for chest pain, palpitations and leg swelling.  Gastrointestinal: Negative for nausea and vomiting.  Musculoskeletal: Positive for back  pain (chronic low back). Negative for arthralgias, joint swelling and myalgias.  Skin: Negative for rash.  Neurological: Negative for headaches.  Hematological: Negative for adenopathy.  Psychiatric/Behavioral: Negative for confusion, self-injury and suicidal ideas. The patient is nervous/anxious.       Objective:    BP (!) 160/68   Pulse 70   Temp 98.1 F (36.7 C) (Oral)   Ht 5\' 4"  (1.626 m)   Wt 159 lb 6.4 oz (72.3 kg)   SpO2 96%   BMI 27.36 kg/m  BP Readings from Last 3 Encounters:  08/09/16 (!) 160/68  06/18/16 126/68  11/17/15 115/73   Wt Readings from Last 3 Encounters:  08/09/16 159 lb 6.4 oz (72.3 kg)  06/18/16 157 lb (71.2 kg)  11/17/15 156 lb 8 oz (71 kg)    Physical Exam  Constitutional: She appears well-developed and well-nourished.  Eyes: Conjunctivae are normal.  Cardiovascular: Normal rate, regular rhythm, normal heart sounds and normal pulses.   Pulmonary/Chest: Effort normal and breath sounds normal. She has no wheezes. She has no rhonchi. She has no rales.  Neurological: She is alert.  Skin: Skin is warm and dry.  Psychiatric: She has a normal mood and affect. Her speech is normal and behavior is normal. Thought content normal.  Vitals reviewed.      Assessment & Plan:   Problem List Items Addressed This Visit      Cardiovascular and Mediastinum   Hypertension - Primary (Chronic)    BP not at goal. Pending CMP. Increase Cozaar to 100mg  with close monitoring.       Relevant Medications   losartan (COZAAR) 100 MG tablet   Other Relevant Orders   Comprehensive metabolic panel     Endocrine   Hypothyroidism (Chronic)   Relevant Orders   TSH     Other   Anxiety    Discussed risks of BZDs with age. She admitts to drinking alcohol, occasional beer and whiskey not totaling more than 4 drinks per week. I would feel more comfortable if she was not on a BZD and we jointly agreed that she may find more optimal control of GAD, OA, and neuropathic  pain from cymbalta. Close follow up.       Relevant Medications   DULoxetine (CYMBALTA) 20 MG capsule   Tobacco use disorder    No interest in changing smoking habits at this time.       Right knee pain    Uncontrolled pain. Trial of cymbalta and voltaren gel. Close follow up. Will consider PT at f/u.      Relevant Medications   diclofenac sodium (VOLTAREN) 1 % GEL    Other Visit Diagnoses   None.      I have discontinued Ms. Weng's gabapentin and ALPRAZolam. I have also changed her losartan. Additionally, I am having her start on DULoxetine and diclofenac sodium. Lastly, I am having her maintain her  Multiple Vitamins-Minerals (MULTI COMPLETE PO), erythromycin, valACYclovir, bisoprolol-hydrochlorothiazide, levothyroxine, and pantoprazole.   Meds ordered this encounter  Medications  . DULoxetine (CYMBALTA) 20 MG capsule    Sig: Take 20 mg by mouth once a day for one week. Then increase to 20 mg twice a day and stay at that dose thereafter.    Dispense:  60 capsule    Refill:  2    Order Specific Question:   Supervising Provider    Answer:   Deborra Medina L [[id number]]  . losartan (COZAAR) 100 MG tablet    Sig: TAKE ONE (1) TABLET BY MOUTH EVERY DAY    Dispense:  90 tablet    Refill:  3    Order Specific Question:   Supervising Provider    Answer:   Deborra Medina L [[id number]]  . diclofenac sodium (VOLTAREN) 1 % GEL    Sig: Apply 4 g topically 4 (four) times daily.    Dispense:  1 Tube    Refill:  3    Order Specific Question:   Supervising Provider    Answer:   Crecencio Mc [[id number]]    Return precautions given.   Risks, benefits, and alternatives of the medications and treatment plan prescribed today were discussed, and patient expressed understanding.   Education regarding symptom management and diagnosis given to patient on AVS.  Continue to follow with Mable Paris, FNP for routine health maintenance.   [name] and I agreed with plan.   Mable Paris,  FNP  Total of 25 minutes spent with patient, greater than 50% of which was spent in discussion of  Anxiety and BZDs.

## 2016-08-09 NOTE — Assessment & Plan Note (Addendum)
BP not at goal. Pending CMP. Increase Cozaar to 100mg  with close monitoring.

## 2016-08-20 DIAGNOSIS — Z1231 Encounter for screening mammogram for malignant neoplasm of breast: Secondary | ICD-10-CM | POA: Diagnosis not present

## 2016-08-20 NOTE — Telephone Encounter (Signed)
closed

## 2016-08-22 ENCOUNTER — Encounter: Payer: Self-pay | Admitting: Family

## 2016-12-14 ENCOUNTER — Ambulatory Visit (INDEPENDENT_AMBULATORY_CARE_PROVIDER_SITE_OTHER): Payer: Medicare HMO

## 2016-12-14 VITALS — BP 118/70 | HR 60 | Temp 98.6°F | Resp 12 | Ht 64.0 in | Wt 155.8 lb

## 2016-12-14 DIAGNOSIS — Z0000 Encounter for general adult medical examination without abnormal findings: Secondary | ICD-10-CM

## 2016-12-14 NOTE — Progress Notes (Signed)
Care was provided under my supervision. I agree with the management as indicated in the note.  Ramandeep Arington DO  

## 2016-12-14 NOTE — Progress Notes (Signed)
Subjective:   Lindsay [name] is a 76 y.o. female who presents for an Initial Medicare Annual Wellness Visit.  Review of Systems    No ROS.  Medicare Wellness Visit.  Cardiac Risk Factors include: advanced age (>9men, >23 women);hypertension     Objective:    Today's Vitals   12/14/16 0818  BP: 118/70  Pulse: 60  Resp: 12  Temp: 98.6 F (37 C)  TempSrc: Oral  SpO2: 97%  Weight: 155 lb 12.8 oz (70.7 kg)  Height: 5\' 4"  (1.626 m)   Body mass index is 26.74 kg/m.   Current Medications (verified) Outpatient Encounter Prescriptions as of 12/14/2016  Medication Sig  . bisoprolol-hydrochlorothiazide (ZIAC) 10-6.25 MG tablet TAKE ONE (1) TABLET BY MOUTH EVERY DAY  . diclofenac sodium (VOLTAREN) 1 % GEL Apply 4 g topically 4 (four) times daily.  . DULoxetine (CYMBALTA) 20 MG capsule Take 20 mg by mouth once a day for one week. Then increase to 20 mg twice a day and stay at that dose thereafter.  . erythromycin ophthalmic ointment Place 1 application into the left eye at bedtime.  Marland Kitchen levothyroxine (SYNTHROID, LEVOTHROID) 50 MCG tablet TAKE ONE (1) TABLET BY MOUTH EVERY DAY  . losartan (COZAAR) 100 MG tablet TAKE ONE (1) TABLET BY MOUTH EVERY DAY  . Multiple Vitamins-Minerals (MULTI COMPLETE PO) Take 1 tablet by mouth. am  . pantoprazole (PROTONIX) 40 MG tablet TAKE ONE (1) TABLET EACH DAY  . valACYclovir (VALTREX) 1000 MG tablet Take 1 tablet (1,000 mg total) by mouth 3 (three) times daily. For 7 days.   No facility-administered encounter medications on file as of 12/14/2016.     Allergies (verified) Aspirin; Nsaids; and Penicillins   History: Past Medical History:  Diagnosis Date  . Anemia   . Anemia   . Anxiety   . Arthritis    osteo, back  . Depression   . Diverticulosis   . GERD (gastroesophageal reflux disease)   . Hematochezia   . Hypertension    controlled on meds  . Hypothyroidism   . Rectal bleeding    Past Surgical History:  Procedure Laterality Date    . ABDOMINAL HYSTERECTOMY    . CARDIAC CATHETERIZATION  1995   normal, Dr Clayborn Bigness  . COLONOSCOPY  12/29/08  . COLONOSCOPY N/A 04/18/2015   Procedure: COLONOSCOPY;  Surgeon: Lucilla Lame, MD;  Location: [place];  Service: Gastroenterology;  Laterality: N/A;  cecum time- 0957  . FOOT SURGERY    . KNEE ARTHROSCOPY Right 10/05/2015   Procedure: ARTHROSCOPY KNEE, partial lateral menisectomy;  Surgeon: Earnestine Leys, MD;  Location: [facility];  Service: Orthopedics;  Laterality: Right;  . POLYPECTOMY  04/18/2015   Procedure: POLYPECTOMY INTESTINAL;  Surgeon: Lucilla Lame, MD;  Location: [place];  Service: Gastroenterology;;  . WRIST SURGERY     Family History  Problem Relation Age of Onset  . Diabetes Mother   . Hypertension Mother   . Diabetes Sister   . Hypertension Sister    Social History   Occupational History  . Not on file.   Social History Main Topics  . Smoking status: Current Every Day Smoker    Packs/day: 0.50    Years: 30.00    Types: Cigarettes  . Smokeless tobacco: Never Used  . Alcohol use 0.0 oz/week     Comment: occassionally/ whiskey on weekend; total of 4 drinks per week  . Drug use: No  . Sexual activity: Not Currently    Tobacco Counseling Ready  to quit: Not Answered Counseling given: Not Answered   Activities of Daily Living In your present state of health, do you have any difficulty performing the following activities: 12/14/2016  Hearing? N  Vision? N  Difficulty concentrating or making decisions? N  Walking or climbing stairs? Y  Dressing or bathing? N  Doing errands, shopping? N  Preparing Food and eating ? N  Using the Toilet? N  In the past six months, have you accidently leaked urine? N  Do you have problems with loss of bowel control? N  Managing your Medications? N  Managing your Finances? N  Housekeeping or managing your Housekeeping? N  Some recent data might be hidden    Immunizations and Health  Maintenance Immunization History  Administered Date(s) Administered  . Influenza Split 09/10/2012  . Influenza Whole 09/15/2011  . Influenza, High Dose Seasonal PF 08/09/2016  . Pneumococcal Conjugate-13 07/07/2014  . Pneumococcal Polysaccharide-23 02/13/2012   Health Maintenance Due  Topic Date Due  . TETANUS/TDAP  04/26/1962  . ZOSTAVAX  04/27/2003  . MAMMOGRAM  07/20/2015    Patient Care Team: Burnard Hawthorne, FNP as PCP - General (Family Medicine) Lucilla Lame, MD as Referring Physician (Gastroenterology)  Indicate any recent Medical Services you may have received from other than [facility] providers in the past year (date may be approximate).     Assessment:   This is a routine wellness examination for Lindsay [name]. The goal of the wellness visit is to assist the patient how to close the gaps in care and create a preventative care plan for the patient.   Osteoporosis risk reviewed.  Medications reviewed; taking without issues or barriers.  Safety issues reviewed; lives alone.  Works 4 days weekly. Smoke detectors in the home. No firearms in the home. Wears seatbelts when driving or riding with others. No violence in the home.  No identified risk were noted; The patient was oriented x 3; appropriate in dress and manner and no objective failures at ADL's or IADL's.   BMI; discussed the importance of a healthy diet, water intake and exercise. Educational material provided.  HTN; followed by PCP.  Mammogram scheduled; follow as directed.  TDAP and ZOSTAVAX vaccine deferred per patient request for follow up with insurance.  Educational material provided.  Patient Concerns: None at this time. Follow up with PCP as needed.  Hearing/Vision screen Hearing Screening Comments: Patient passes the whisper test Vision Screening Comments: Followed by [facility]  Wears glasses Visual acuity not assessed per patient preference since she has regular follow up with her  ophthalmologist   Dietary issues and exercise activities discussed: Current Exercise Habits: The patient does not participate in regular exercise at present  Goals    . Increase physical activity          Use home stationary exercise bike 10 minutes daily, increase as tolerated.    . Increase water intake          Stay hydrated and drink plenty of water/fluids      Depression Screen PHQ 2/9 Scores 12/14/2016 08/09/2016 08/01/2015 07/07/2014  PHQ - 2 Score 0 0 0 1    Fall Risk Fall Risk  12/14/2016 08/09/2016 06/18/2016 08/01/2015 07/07/2014  Falls in the past year? No No No No No    Cognitive Function: MMSE - Mini Mental State Exam 12/14/2016  Orientation to time 5  Orientation to Place 5  Registration 3  Attention/ Calculation 5  Recall 3  Language- name 2 objects 2  Language- repeat 1  Language- follow 3 step command 3  Language- read & follow direction 1  Write a sentence 1  Copy design 1  Total score 30        Screening Tests Health Maintenance  Topic Date Due  . TETANUS/TDAP  04/26/1962  . ZOSTAVAX  04/27/2003  . MAMMOGRAM  07/20/2015  . COLONOSCOPY  04/17/2025  . INFLUENZA VACCINE  Completed  . DEXA SCAN  Completed  . PNA vac Low Risk Adult  Completed      Plan:    End of life planning; Advance aging; Advanced directives discussed. No HCPOA/Living Will.  Additional information provided to help her start the conversation with her family.  Time spent on this topic is 16 minutes.  Medicare Attestation I have personally reviewed: The patient's medical and social history Their use of alcohol, tobacco or illicit drugs Their current medications and supplements The patient's functional ability including ADLs,fall risks, home safety risks, cognitive, and hearing and visual impairment Diet and physical activities Evidence for depression   The patient's weight, height, BMI, and visual acuity have been recorded in the chart.  I have made referrals and provided  education to the patient based on review of the above and I have provided the patient with a written personalized care plan for preventive services.    During the course of the visit, Lindsay [name] was educated and counseled about the following appropriate screening and preventive services:   Vaccines to include Pneumoccal, Influenza, Hepatitis B, Td, Zostavax, HCV  Electrocardiogram  Cardiovascular disease screening  Colorectal cancer screening  Bone density screening  Diabetes screening  Glaucoma screening  Mammography/PAP  Nutrition counseling  Smoking cessation counseling  Patient Instructions (the written plan) were given to the patient.    Varney Biles, LPN   [phone]

## 2016-12-14 NOTE — Patient Instructions (Addendum)
[name] , Thank you for taking time to come for your Medicare Wellness Visit. I appreciate your ongoing commitment to your health goals. Please review the following plan we discussed and let me know if I can assist you in the future.   Discuss options to quit smoking and prescription for the new shingles vaccine with Lindsay Paris FNP, at next follow up  These are the goals we discussed: Goals    . Increase physical activity          Use home stationary exercise bike 10 minutes daily, increase as tolerated.    . Increase water intake          Stay hydrated and drink plenty of water/fluids       This is a list of the screening recommended for you and due dates:  Health Maintenance  Topic Date Due  . Tetanus Vaccine  04/26/1962  . Shingles Vaccine  04/27/2003  . Mammogram  07/20/2015  . Colon Cancer Screening  04/17/2025  . Flu Shot  Completed  . DEXA scan (bone density measurement)  Completed  . Pneumonia vaccines  Completed    Steps to Quit Smoking Smoking tobacco can be harmful to your health and can affect almost every organ in your body. Smoking puts you, and those around you, at risk for developing many serious chronic diseases. Quitting smoking is difficult, but it is one of the best things that you can do for your health. It is never too late to quit. What are the benefits of quitting smoking? When you quit smoking, you lower your risk of developing serious diseases and conditions, such as:  Lung cancer or lung disease, such as COPD.  Heart disease.  Stroke.  Heart attack.  Infertility.  Osteoporosis and bone fractures. Additionally, symptoms such as coughing, wheezing, and shortness of breath may get better when you quit. You may also find that you get sick less often because your body is stronger at fighting off colds and infections. If you are pregnant, quitting smoking can help to reduce your chances of having a baby of low birth weight. How do I get  ready to quit? When you decide to quit smoking, create a plan to make sure that you are successful. Before you quit:  Pick a date to quit. Set a date within the next two weeks to give you time to prepare.  Write down the reasons why you are quitting. Keep this list in places where you will see it often, such as on your bathroom mirror or in your car or wallet.  Identify the people, places, things, and activities that make you want to smoke (triggers) and avoid them. Make sure to take these actions:  Throw away all cigarettes at home, at work, and in your car.  Throw away smoking accessories, such as Scientist, research (medical).  Clean your car and make sure to empty the ashtray.  Clean your home, including curtains and carpets.  Tell your family, friends, and coworkers that you are quitting. Support from your loved ones can make quitting easier.  Talk with your health care provider about your options for quitting smoking.  Find out what treatment options are covered by your health insurance. What strategies can I use to quit smoking? Talk with your healthcare provider about different strategies to quit smoking. Some strategies include:  Quitting smoking altogether instead of gradually lessening how much you smoke over a period of time. Research shows that quitting "cold [place]" is more  successful than gradually quitting.  Attending in-person counseling to help you build problem-solving skills. You are more likely to have success in quitting if you attend several counseling sessions. Even short sessions of 10 minutes can be effective.  Finding resources and support systems that can help you to quit smoking and remain smoke-free after you quit. These resources are most helpful when you use them often. They can include:  Online chats with a Social worker.  Telephone quitlines.  Printed Furniture conservator/restorer.  Support groups or group counseling.  Text messaging programs.  Mobile phone  applications.  Taking medicines to help you quit smoking. (If you are pregnant or breastfeeding, talk with your health care provider first.) Some medicines contain nicotine and some do not. Both types of medicines help with cravings, but the medicines that include nicotine help to relieve withdrawal symptoms. Your health care provider may recommend:  Nicotine patches, gum, or lozenges.  Nicotine inhalers or sprays.  Non-nicotine medicine that is taken by mouth. Talk with your health care provider about combining strategies, such as taking medicines while you are also receiving in-person counseling. Using these two strategies together makes you more likely to succeed in quitting than if you used either strategy on its own. If you are pregnant or breastfeeding, talk with your health care provider about finding counseling or other support strategies to quit smoking. Do not take medicine to help you quit smoking unless told to do so by your health care provider. What things can I do to make it easier to quit? Quitting smoking might feel overwhelming at first, but there is a lot that you can do to make it easier. Take these important actions:  Reach out to your family and friends and ask that they support and encourage you during this time. Call telephone quitlines, reach out to support groups, or work with a counselor for support.  Ask people who smoke to avoid smoking around you.  Avoid places that trigger you to smoke, such as bars, parties, or smoke-break areas at work.  Spend time around people who do not smoke.  Lessen stress in your life, because stress can be a smoking trigger for some people. To lessen stress, try:  Exercising regularly.  Deep-breathing exercises.  Yoga.  Meditating.  Performing a body scan. This involves closing your eyes, scanning your body from head to toe, and noticing which parts of your body are particularly tense. Purposefully relax the muscles in those  areas.  Download or purchase mobile phone or tablet apps (applications) that can help you stick to your quit plan by providing reminders, tips, and encouragement. There are many free apps, such as QuitGuide from the [employer] Office manager for Disease Control and Prevention). You can find other support for quitting smoking (smoking cessation) through smokefree.gov and other websites. How will I feel when I quit smoking? Within the first 24 hours of quitting smoking, you may start to feel some withdrawal symptoms. These symptoms are usually most noticeable 2-3 days after quitting, but they usually do not last beyond 2-3 weeks. Changes or symptoms that you might experience include:  Mood swings.  Restlessness, anxiety, or irritation.  Difficulty concentrating.  Dizziness.  Strong cravings for sugary foods in addition to nicotine.  Mild weight gain.  Constipation.  Nausea.  Coughing or a sore throat.  Changes in how your medicines work in your body.  A depressed mood.  Difficulty sleeping (insomnia). After the first 2-3 weeks of quitting, you may start to notice more positive  results, such as:  Improved sense of smell and taste.  Decreased coughing and sore throat.  Slower heart rate.  Lower blood pressure.  Clearer skin.  The ability to breathe more easily.  Fewer sick days. Quitting smoking is very challenging for most people. Do not get discouraged if you are not successful the first time. Some people need to make many attempts to quit before they achieve long-term success. Do your best to stick to your quit plan, and talk with your health care provider if you have any questions or concerns. This information is not intended to replace advice given to you by your health care provider. Make sure you discuss any questions you have with your health care provider. Document Released: 11/13/2001 Document Revised: 07/17/2016 Document Reviewed: 04/05/2015 Elsevier Interactive Patient  Education  2017 [employer].

## 2016-12-19 ENCOUNTER — Ambulatory Visit: Payer: Medicare HMO | Admitting: Family

## 2016-12-24 ENCOUNTER — Encounter: Payer: Self-pay | Admitting: Family

## 2016-12-24 ENCOUNTER — Ambulatory Visit (INDEPENDENT_AMBULATORY_CARE_PROVIDER_SITE_OTHER): Payer: Medicare HMO | Admitting: Family

## 2016-12-24 VITALS — BP 160/78 | HR 65 | Temp 97.8°F | Ht 64.0 in | Wt 156.8 lb

## 2016-12-24 DIAGNOSIS — I10 Essential (primary) hypertension: Secondary | ICD-10-CM | POA: Diagnosis not present

## 2016-12-24 DIAGNOSIS — F17200 Nicotine dependence, unspecified, uncomplicated: Secondary | ICD-10-CM

## 2016-12-24 DIAGNOSIS — F419 Anxiety disorder, unspecified: Secondary | ICD-10-CM

## 2016-12-24 DIAGNOSIS — Z122 Encounter for screening for malignant neoplasm of respiratory organs: Secondary | ICD-10-CM

## 2016-12-24 DIAGNOSIS — R69 Illness, unspecified: Secondary | ICD-10-CM | POA: Diagnosis not present

## 2016-12-24 MED ORDER — DULOXETINE HCL 20 MG PO CPEP: ORAL_CAPSULE | ORAL | 2 refills | Status: DC

## 2016-12-24 NOTE — Progress Notes (Signed)
Pre visit review using our clinic review tool, if applicable. No additional management support is needed unless otherwise documented below in the visit note. 

## 2016-12-24 NOTE — Assessment & Plan Note (Addendum)
Elevated. No CP, SOB. Patient and I discussed that she would try again to take the full dose of Cozaar, 100 mg. Also advised her to bring her blood pressure cuffs so it can be calibrated. Follow up one month for BP check, BMP.

## 2016-12-24 NOTE — Progress Notes (Signed)
Subjective:    Patient ID: Lindsay [name], female    DOB: 1941-02-01, 76 y.o.   MRN: [id number]  CC: Lindsay [name] is a 76 y.o. female who presents today for follow up.   HPI: Here for f/u HTN.  Came from work and think elevated. No exercise. Follows low salt diet.  Only taking half tablet of cozaar as felt tired on medication. Hasn;t taken BP at home.   Denies exertional chest pain or pressure, numbness or tingling radiating to left arm or jaw, palpitations, dizziness, frequent headaches, changes in vision, or shortness of breath.    No desire for quit smoking.   Started Cymbalta at last visit the patient only took 20 mg, did not take 40 mg as we discussed. Expensive for  Her. History GI bleed and is not a candidate for any oral NSAIDs. Chronic arthritis and the like something for pain as well as depression, anxiety.      HISTORY:  Past Medical History:  Diagnosis Date  . Anemia   . Anemia   . Anxiety   . Arthritis    osteo, back  . Depression   . Diverticulosis   . GERD (gastroesophageal reflux disease)   . Hematochezia   . Hypertension    controlled on meds  . Hypothyroidism   . Rectal bleeding    Past Surgical History:  Procedure Laterality Date  . ABDOMINAL HYSTERECTOMY    . CARDIAC CATHETERIZATION  1995   normal, Dr Clayborn Bigness  . COLONOSCOPY  12/29/08  . COLONOSCOPY N/A 04/18/2015   Procedure: COLONOSCOPY;  Surgeon: Lucilla Lame, MD;  Location: [facility];  Service: Gastroenterology;  Laterality: N/A;  cecum time- 0957  . FOOT SURGERY    . KNEE ARTHROSCOPY Right 10/05/2015   Procedure: ARTHROSCOPY KNEE, partial lateral menisectomy;  Surgeon: Earnestine Leys, MD;  Location: [facility];  Service: Orthopedics;  Laterality: Right;  . POLYPECTOMY  04/18/2015   Procedure: POLYPECTOMY INTESTINAL;  Surgeon: Lucilla Lame, MD;  Location: [facility];  Service: Gastroenterology;;  . WRIST SURGERY     Family History  Problem Relation Age of Onset  .  Diabetes Mother   . Hypertension Mother   . Diabetes Sister   . Hypertension Sister     Allergies: Aspirin; Nsaids; and Penicillins Current Outpatient Prescriptions on File Prior to Visit  Medication Sig Dispense Refill  . bisoprolol-hydrochlorothiazide (ZIAC) 10-6.25 MG tablet TAKE ONE (1) TABLET BY MOUTH EVERY DAY 90 tablet 3  . diclofenac sodium (VOLTAREN) 1 % GEL Apply 4 g topically 4 (four) times daily. 1 Tube 3  . erythromycin ophthalmic ointment Place 1 application into the left eye at bedtime. 3.5 g 0  . levothyroxine (SYNTHROID, LEVOTHROID) 50 MCG tablet TAKE ONE (1) TABLET BY MOUTH EVERY DAY 90 tablet 3  . losartan (COZAAR) 100 MG tablet TAKE ONE (1) TABLET BY MOUTH EVERY DAY 90 tablet 3  . Multiple Vitamins-Minerals (MULTI COMPLETE PO) Take 1 tablet by mouth. am    . pantoprazole (PROTONIX) 40 MG tablet TAKE ONE (1) TABLET EACH DAY 90 tablet 3  . valACYclovir (VALTREX) 1000 MG tablet Take 1 tablet (1,000 mg total) by mouth 3 (three) times daily. For 7 days. 21 tablet 0   No current facility-administered medications on file prior to visit.     Social History  Substance Use Topics  . Smoking status: Current Every Day Smoker    Packs/day: 0.50    Years: 30.00    Types: Cigarettes  .  Smokeless tobacco: Never Used  . Alcohol use 0.0 oz/week     Comment: occassionally/ whiskey on weekend; total of 4 drinks per week    Review of Systems  Constitutional: Negative for chills and fever.  Eyes: Negative for visual disturbance.  Respiratory: Negative for cough.   Cardiovascular: Negative for chest pain and palpitations.  Gastrointestinal: Negative for nausea and vomiting.  Neurological: Negative for headaches.  Psychiatric/Behavioral: The patient is nervous/anxious.       Objective:    BP (!) 160/78   Pulse 65   Temp 97.8 F (36.6 C) (Oral)   Ht 5\' 4"  (1.626 m)   Wt 156 lb 12.8 oz (71.1 kg)   SpO2 96%   BMI 26.91 kg/m  BP Readings from Last 3 Encounters:  12/24/16  (!) 160/78  12/14/16 118/70  08/09/16 (!) 160/68   Wt Readings from Last 3 Encounters:  12/24/16 156 lb 12.8 oz (71.1 kg)  12/14/16 155 lb 12.8 oz (70.7 kg)  08/09/16 159 lb 6.4 oz (72.3 kg)    Physical Exam  Constitutional: She appears well-developed and well-nourished.  Eyes: Conjunctivae are normal.  Cardiovascular: Normal rate, regular rhythm, normal heart sounds and normal pulses.   Pulmonary/Chest: Effort normal and breath sounds normal. She has no wheezes. She has no rhonchi. She has no rales.  Neurological: She is alert.  Skin: Skin is warm and dry.  Psychiatric: She has a normal mood and affect. Her speech is normal and behavior is normal. Thought content normal.  Vitals reviewed.      Assessment & Plan:   Problem List Items Addressed This Visit      Cardiovascular and Mediastinum   Hypertension (Chronic)    Elevated. No CP, SOB. Patient and I discussed that she would try again to take the full dose of Cozaar, 100 mg. Also advised her to bring her blood pressure cuffs so it can be calibrated. Follow up one month for BP check, BMP.         Other   Anxiety    Unchanged. Will try 40 mg cymbalta before new agent. Discussed possible gabapentin as adjunct for psin however concern with alcohol use. Coupons printed. F/u one montn      Relevant Medications   DULoxetine (CYMBALTA) 20 MG capsule   Tobacco use disorder    No desire to quit at this time. Discussed CT chest and she was agreeable due to longstanding history.       Other Visit Diagnoses    Encounter for screening for lung cancer    -  Primary   Relevant Orders   CT CHEST LUNG CANCER SCREENING LOW DOSE WO CONTRAST       I am having [name] maintain her Multiple Vitamins-Minerals (MULTI COMPLETE PO), erythromycin, valACYclovir, bisoprolol-hydrochlorothiazide, levothyroxine, pantoprazole, losartan, diclofenac sodium, and DULoxetine.   Meds ordered this encounter  Medications  . DULoxetine (CYMBALTA)  20 MG capsule    Sig: Take 20 mg by mouth once a day for one week. Then increase to 20 mg twice a day and stay at that dose thereafter.    Dispense:  60 capsule    Refill:  2    Order Specific Question:   Supervising Provider    Answer:   Crecencio Mc [[id number]]    Return precautions given.   Risks, benefits, and alternatives of the medications and treatment plan prescribed today were discussed, and patient expressed understanding.   Education regarding symptom management and diagnosis given to patient  on AVS.  Continue to follow with Mable Paris, FNP for routine health maintenance.   [name] and I agreed with plan.   Mable Paris, FNP

## 2016-12-24 NOTE — Assessment & Plan Note (Signed)
No desire to quit at this time. Discussed CT chest and she was agreeable due to longstanding history.

## 2016-12-24 NOTE — Patient Instructions (Addendum)
May get shingles vaccine at local pharmacy.   Take cymbalta 40 mg once daily.   CT Chest for lung cancer screening- our nurse Shawn wil give you a call  Monitor BP at home and bring cuff in. Take full dose of cozaar and let me know how you feel.  Goal of BP is less than 140/90.

## 2016-12-24 NOTE — Assessment & Plan Note (Addendum)
Unchanged. Will try 40 mg cymbalta before new agent. Discussed possible gabapentin as adjunct for psin however concern with alcohol use. Coupons printed. F/u one montn

## 2016-12-25 ENCOUNTER — Telehealth: Payer: Self-pay | Admitting: *Deleted

## 2016-12-25 DIAGNOSIS — Z87891 Personal history of nicotine dependence: Secondary | ICD-10-CM

## 2016-12-25 NOTE — Telephone Encounter (Signed)
Received referral for initial lung cancer screening scan. Contacted patient and obtained smoking history,(current, 30 pack year) as well as answering questions related to screening process. Patient denies signs of lung cancer such as weight loss or hemoptysis. Patient denies comorbidity that would prevent curative treatment if lung cancer were found. Patient is tentatively scheduled for shared decision making visit and CT scan on 01/01/17 1:30pm, pending insurance approval from business office.

## 2017-01-01 ENCOUNTER — Ambulatory Visit: Payer: Medicare HMO

## 2017-01-01 ENCOUNTER — Inpatient Hospital Stay: Payer: Medicare HMO | Admitting: Oncology

## 2017-01-03 ENCOUNTER — Telehealth: Payer: Self-pay | Admitting: *Deleted

## 2017-01-03 NOTE — Telephone Encounter (Signed)
Voicemail left as requested return call to reschedule lung screening scan.

## 2017-01-07 ENCOUNTER — Telehealth: Payer: Self-pay | Admitting: *Deleted

## 2017-01-07 NOTE — Telephone Encounter (Signed)
Received referral for low dose lung cancer screening CT scan. Voicemail left at phone number listed in EMR for patient to call me back to facilitate scheduling scan.  

## 2017-02-04 ENCOUNTER — Encounter: Payer: Self-pay | Admitting: *Deleted

## 2017-02-09 ENCOUNTER — Observation Stay
Admission: EM | Admit: 2017-02-09 | Discharge: 2017-02-10 | Disposition: A | Discharge status: Home / Self Care | Payer: Medicare HMO | Attending: Internal Medicine | Admitting: Internal Medicine

## 2017-02-09 ENCOUNTER — Emergency Department: Payer: Medicare HMO

## 2017-02-09 ENCOUNTER — Encounter: Payer: Self-pay | Admitting: Emergency Medicine

## 2017-02-09 DIAGNOSIS — K219 Gastro-esophageal reflux disease without esophagitis: Secondary | ICD-10-CM | POA: Diagnosis not present

## 2017-02-09 DIAGNOSIS — I10 Essential (primary) hypertension: Secondary | ICD-10-CM | POA: Diagnosis not present

## 2017-02-09 DIAGNOSIS — R9431 Abnormal electrocardiogram [ECG] [EKG]: Secondary | ICD-10-CM

## 2017-02-09 DIAGNOSIS — Z79899 Other long term (current) drug therapy: Secondary | ICD-10-CM | POA: Insufficient documentation

## 2017-02-09 DIAGNOSIS — F17210 Nicotine dependence, cigarettes, uncomplicated: Secondary | ICD-10-CM | POA: Diagnosis not present

## 2017-02-09 DIAGNOSIS — E039 Hypothyroidism, unspecified: Secondary | ICD-10-CM | POA: Insufficient documentation

## 2017-02-09 DIAGNOSIS — F329 Major depressive disorder, single episode, unspecified: Secondary | ICD-10-CM | POA: Insufficient documentation

## 2017-02-09 DIAGNOSIS — J189 Pneumonia, unspecified organism: Secondary | ICD-10-CM

## 2017-02-09 DIAGNOSIS — F419 Anxiety disorder, unspecified: Secondary | ICD-10-CM | POA: Diagnosis not present

## 2017-02-09 DIAGNOSIS — R0789 Other chest pain: Secondary | ICD-10-CM | POA: Diagnosis not present

## 2017-02-09 DIAGNOSIS — F1010 Alcohol abuse, uncomplicated: Secondary | ICD-10-CM | POA: Diagnosis not present

## 2017-02-09 DIAGNOSIS — I200 Unstable angina: Secondary | ICD-10-CM | POA: Diagnosis not present

## 2017-02-09 DIAGNOSIS — R079 Chest pain, unspecified: Secondary | ICD-10-CM | POA: Diagnosis not present

## 2017-02-09 DIAGNOSIS — R69 Illness, unspecified: Secondary | ICD-10-CM | POA: Diagnosis not present

## 2017-02-09 LAB — BASIC METABOLIC PANEL
ANION GAP: 11 (ref 5–15)
BUN: 9 mg/dL (ref 6–20)
CALCIUM: 8.8 mg/dL — AB (ref 8.9–10.3)
CO2: 27 mmol/L (ref 22–32)
Chloride: 104 mmol/L (ref 101–111)
Creatinine, Ser: 0.33 mg/dL — ABNORMAL LOW (ref 0.44–1.00)
GFR calc Af Amer: >60 mL/min (ref >60)
GFR calc non Af Amer: >60 mL/min (ref >60)
Glucose, Bld: 159 mg/dL — ABNORMAL HIGH (ref 65–99)
POTASSIUM: 3.1 mmol/L — AB (ref 3.5–5.1)
Sodium: 142 mmol/L (ref 135–145)

## 2017-02-09 LAB — CBC
HEMATOCRIT: 41.1 % (ref 35.0–47.0)
HEMOGLOBIN: 14.2 g/dL (ref 12.0–16.0)
MCH: 30.2 pg (ref 26.0–34.0)
MCHC: 34.6 g/dL (ref 32.0–36.0)
MCV: 87.3 fL (ref 80.0–100.0)
Platelets: 197 10*3/uL (ref 150–440)
RBC: 4.71 MIL/uL (ref 3.80–5.20)
RDW: 13.8 % (ref 11.5–14.5)
WBC: 12 10*3/uL — ABNORMAL HIGH (ref 3.6–11.0)

## 2017-02-09 LAB — HEPATIC FUNCTION PANEL
ALT: 17 U/L (ref 14–54)
AST: 21 U/L (ref 15–41)
Albumin: 4 g/dL (ref 3.5–5.0)
Alkaline Phosphatase: 48 U/L (ref 38–126)
BILIRUBIN DIRECT: 0.1 mg/dL (ref 0.1–0.5)
BILIRUBIN TOTAL: 0.6 mg/dL (ref 0.3–1.2)
Indirect Bilirubin: 0.5 mg/dL (ref 0.3–0.9)
Total Protein: 7.3 g/dL (ref 6.5–8.1)

## 2017-02-09 LAB — PROTIME-INR
INR: 0.93
PROTHROMBIN TIME: 12.5 s (ref 11.4–15.2)

## 2017-02-09 LAB — APTT: aPTT: 26 seconds (ref 24–36)

## 2017-02-09 LAB — TROPONIN I
Troponin I: <0.03 ng/mL (ref <0.03)
Troponin I: <0.03 ng/mL (ref <0.03)
Troponin I: <0.03 ng/mL (ref <0.03)
Troponin I: <0.03 ng/mL (ref <0.03)

## 2017-02-09 LAB — TSH: TSH: 0.297 u[IU]/mL — ABNORMAL LOW (ref 0.350–4.500)

## 2017-02-09 LAB — HEPARIN LEVEL (UNFRACTIONATED): Heparin Unfractionated: 0.25 IU/mL — ABNORMAL LOW (ref 0.30–0.70)

## 2017-02-09 LAB — LIPASE, BLOOD: LIPASE: 21 U/L (ref 11–51)

## 2017-02-09 LAB — ETHANOL: Alcohol, Ethyl (B): 40 mg/dL — ABNORMAL HIGH (ref <5)

## 2017-02-09 MED ORDER — DULOXETINE HCL 20 MG PO CPEP
20.0000 mg | ORAL_CAPSULE | Freq: Two times a day (BID) | ORAL | Status: DC
Start: 2017-02-09 — End: 2017-02-10
  Administered 2017-02-09 – 2017-02-10 (×3): 20 mg via ORAL
  Filled 2017-02-09 (×3): qty 1

## 2017-02-09 MED ORDER — VITAMIN B-1 100 MG PO TABS
100.0000 mg | ORAL_TABLET | Freq: Every day | ORAL | Status: DC
  Administered 2017-02-09 – 2017-02-10 (×2): 100 mg via ORAL
  Filled 2017-02-09 (×2): qty 1

## 2017-02-09 MED ORDER — POTASSIUM CHLORIDE CRYS ER 20 MEQ PO TBCR
40.0000 meq | EXTENDED_RELEASE_TABLET | Freq: Once | ORAL | Status: AC
  Administered 2017-02-09: 40 meq via ORAL
  Filled 2017-02-09: qty 2

## 2017-02-09 MED ORDER — DEXTROSE 5 % IV SOLN: 250.0000 mg | INTRAVENOUS | Status: DC

## 2017-02-09 MED ORDER — LORAZEPAM 2 MG/ML IJ SOLN
0.0000 mg | Freq: Four times a day (QID) | INTRAMUSCULAR | Status: DC
  Administered 2017-02-09 (×2): 1 mg via INTRAVENOUS
  Filled 2017-02-09: qty 2

## 2017-02-09 MED ORDER — CEFTRIAXONE SODIUM 1 G IJ SOLR: 1.0000 g | INTRAMUSCULAR | Status: DC

## 2017-02-09 MED ORDER — CEFTRIAXONE SODIUM-DEXTROSE 1-3.74 GM-% IV SOLR
1.0000 g | Freq: Once | INTRAVENOUS | Status: AC
  Administered 2017-02-09: 1 g via INTRAVENOUS
  Filled 2017-02-09: qty 50

## 2017-02-09 MED ORDER — PANTOPRAZOLE SODIUM 40 MG PO TBEC
40.0000 mg | DELAYED_RELEASE_TABLET | Freq: Every day | ORAL | Status: DC
  Administered 2017-02-09 – 2017-02-10 (×2): 40 mg via ORAL
  Filled 2017-02-09 (×2): qty 1

## 2017-02-09 MED ORDER — GI COCKTAIL ~~LOC~~
30.0000 mL | Freq: Once | ORAL | Status: AC
  Administered 2017-02-09: 30 mL via ORAL
  Filled 2017-02-09: qty 30

## 2017-02-09 MED ORDER — FOLIC ACID 1 MG PO TABS
1.0000 mg | ORAL_TABLET | Freq: Every day | ORAL | Status: DC
  Administered 2017-02-09 – 2017-02-10 (×2): 1 mg via ORAL
  Filled 2017-02-09 (×2): qty 1

## 2017-02-09 MED ORDER — LORAZEPAM 1 MG PO TABS: 1.0000 mg | ORAL_TABLET | Freq: Four times a day (QID) | ORAL | Status: DC | PRN

## 2017-02-09 MED ORDER — ACETAMINOPHEN 325 MG PO TABS
650.0000 mg | ORAL_TABLET | Freq: Four times a day (QID) | ORAL | Status: DC | PRN
  Administered 2017-02-09: 650 mg via ORAL

## 2017-02-09 MED ORDER — THIAMINE HCL 100 MG/ML IJ SOLN: 100.0000 mg | Freq: Every day | INTRAMUSCULAR | Status: DC

## 2017-02-09 MED ORDER — LORAZEPAM 2 MG/ML IJ SOLN
1.0000 mg | Freq: Four times a day (QID) | INTRAMUSCULAR | Status: DC | PRN
  Filled 2017-02-09: qty 1

## 2017-02-09 MED ORDER — ERYTHROMYCIN 5 MG/GM OP OINT
1.0000 "application " | TOPICAL_OINTMENT | Freq: Every day | OPHTHALMIC | Status: DC
  Filled 2017-02-09: qty 3.5

## 2017-02-09 MED ORDER — ASPIRIN 81 MG PO CHEW
243.0000 mg | CHEWABLE_TABLET | Freq: Once | ORAL | Status: DC
  Filled 2017-02-09: qty 3

## 2017-02-09 MED ORDER — NITROGLYCERIN 0.4 MG SL SUBL
0.4000 mg | SUBLINGUAL_TABLET | SUBLINGUAL | Status: DC | PRN
  Administered 2017-02-09: 0.4 mg via SUBLINGUAL
  Filled 2017-02-09: qty 1

## 2017-02-09 MED ORDER — DOXYCYCLINE HYCLATE 100 MG PO TABS
100.0000 mg | ORAL_TABLET | Freq: Two times a day (BID) | ORAL | Status: DC
  Administered 2017-02-09: 100 mg via ORAL
  Filled 2017-02-09: qty 1

## 2017-02-09 MED ORDER — SODIUM CHLORIDE 0.9% FLUSH
3.0000 mL | Freq: Two times a day (BID) | INTRAVENOUS | Status: DC
  Administered 2017-02-09 – 2017-02-10 (×3): 3 mL via INTRAVENOUS

## 2017-02-09 MED ORDER — DEXTROSE 5 % IV SOLN: 500.0000 mg | Freq: Once | INTRAVENOUS | Status: DC

## 2017-02-09 MED ORDER — HEPARIN SODIUM (PORCINE) 5000 UNIT/ML IJ SOLN
4000.0000 [IU] | Freq: Once | INTRAMUSCULAR | Status: DC
  Filled 2017-02-09: qty 1

## 2017-02-09 MED ORDER — LORAZEPAM 2 MG/ML IJ SOLN: 0.0000 mg | Freq: Two times a day (BID) | INTRAMUSCULAR | Status: DC

## 2017-02-09 MED ORDER — LEVOTHYROXINE SODIUM 50 MCG PO TABS
50.0000 ug | ORAL_TABLET | Freq: Every day | ORAL | Status: DC
  Administered 2017-02-09 – 2017-02-10 (×2): 50 ug via ORAL
  Filled 2017-02-09 (×2): qty 1

## 2017-02-09 MED ORDER — ALUM & MAG HYDROXIDE-SIMETH 200-200-20 MG/5ML PO SUSP
15.0000 mL | ORAL | Status: DC | PRN
  Administered 2017-02-09: 15 mL via ORAL
  Filled 2017-02-09: qty 30

## 2017-02-09 MED ORDER — POTASSIUM CHLORIDE IN NACL 20-0.9 MEQ/L-% IV SOLN
INTRAVENOUS | Status: DC
  Administered 2017-02-09: 05:00:00 via INTRAVENOUS
  Filled 2017-02-09 (×3): qty 1000

## 2017-02-09 MED ORDER — DEXTROSE 5 % IV SOLN
500.0000 mg | INTRAVENOUS | Status: DC
  Administered 2017-02-09: 500 mg via INTRAVENOUS
  Filled 2017-02-09 (×2): qty 500

## 2017-02-09 MED ORDER — DOCUSATE SODIUM 100 MG PO CAPS
100.0000 mg | ORAL_CAPSULE | Freq: Two times a day (BID) | ORAL | Status: DC
  Administered 2017-02-09 – 2017-02-10 (×3): 100 mg via ORAL
  Filled 2017-02-09 (×3): qty 1

## 2017-02-09 MED ORDER — BISOPROLOL-HYDROCHLOROTHIAZIDE 10-6.25 MG PO TABS
1.0000 | ORAL_TABLET | Freq: Every day | ORAL | Status: DC
  Administered 2017-02-09: 1 via ORAL
  Filled 2017-02-09 (×2): qty 1

## 2017-02-09 MED ORDER — ACETAMINOPHEN 650 MG RE SUPP: 650.0000 mg | Freq: Four times a day (QID) | RECTAL | Status: DC | PRN

## 2017-02-09 MED ORDER — ADULT MULTIVITAMIN W/MINERALS CH
1.0000 | ORAL_TABLET | Freq: Every day | ORAL | Status: DC
  Administered 2017-02-09 – 2017-02-10 (×2): 1 via ORAL
  Filled 2017-02-09 (×2): qty 1

## 2017-02-09 MED ORDER — LABETALOL HCL 5 MG/ML IV SOLN: 5.0000 mg | INTRAVENOUS | Status: DC | PRN

## 2017-02-09 MED ORDER — ONDANSETRON HCL 4 MG PO TABS: 4.0000 mg | ORAL_TABLET | Freq: Four times a day (QID) | ORAL | Status: DC | PRN

## 2017-02-09 MED ORDER — HEPARIN (PORCINE) IN NACL 100-0.45 UNIT/ML-% IJ SOLN
1000.0000 [IU]/h | INTRAMUSCULAR | Status: DC
  Administered 2017-02-09: 850 [IU]/h via INTRAVENOUS
  Filled 2017-02-09: qty 250

## 2017-02-09 MED ORDER — ONDANSETRON HCL 4 MG/2ML IJ SOLN: 4.0000 mg | Freq: Four times a day (QID) | INTRAMUSCULAR | Status: DC | PRN

## 2017-02-09 MED ORDER — DEXTROSE 5 % IV SOLN: 1.0000 g | INTRAVENOUS | Status: DC

## 2017-02-09 MED ORDER — CEFTRIAXONE SODIUM-DEXTROSE 1-3.74 GM-% IV SOLR
1.0000 g | INTRAVENOUS | Status: DC
  Administered 2017-02-10: 1 g via INTRAVENOUS
  Filled 2017-02-09: qty 50

## 2017-02-09 MED ORDER — LOSARTAN POTASSIUM 50 MG PO TABS
100.0000 mg | ORAL_TABLET | Freq: Every day | ORAL | Status: DC
  Administered 2017-02-09 – 2017-02-10 (×2): 100 mg via ORAL
  Filled 2017-02-09 (×2): qty 2

## 2017-02-09 NOTE — Care Management Obs Status (Signed)
[facility] NOTIFICATION   Patient Details  Name: RYNN MARKIEWICZ MRN: [id number] Date of Birth: 10-31-1941   Medicare Observation Status Notification Given:  Yes    Skarlet Lyons A, RN 02/09/2017, 3:38 PM

## 2017-02-09 NOTE — ED Provider Notes (Signed)
[facility] Emergency Department Provider Note  ____________________________________________   First MD Initiated Contact with Patient 02/09/17 0109     (approximate)  I have reviewed the triage vital signs and the nursing notes.   HISTORY  Chief Complaint Chest Pain    HPI Lindsay [name] is a 76 y.o. female with a history of fairly regular alcohol use, daily tobacco use, and hypertension but who denies any history of coronary artery disease/MI, diabetes, and hyperlipidemiawho presents for evaluation of chest pain.  She reports that the pain in her chest is a dull ache that occasionally radiates to her neck.  It started about 4 hours ago and has gotten worse.  Taking deep breaths makes the pain worse and resting makes it a little bit better.  She took a baby aspirin a few hours ago but has taken other medications.  She does not have any shortness of breath other than the worsening pain when she takes a deep one.  She denies sweating, nausea, vomiting, abdominal pain, diarrhea, dysuria.  She does report that she had a few drinks earlier this afternoon around the time the pain started.  She has never had pain like this before.  She describes it as severe and a 9 out of 10.   Past Medical History:  Diagnosis Date  . Anemia   . Anemia   . Anxiety   . Arthritis    osteo, back  . Depression   . Diverticulosis   . GERD (gastroesophageal reflux disease)   . Hematochezia   . Hypertension    controlled on meds  . Hypothyroidism   . Rectal bleeding     Patient Active Problem List   Diagnosis Date Noted  . Herpes zoster 07/12/2015  . Corneal abrasion, left 07/01/2015  . GI bleed 06/25/2015  . Calcific Achilles tendinitis 07/16/2014  . Right knee pain 07/07/2014  . Tobacco use disorder 03/18/2013  . Anxiety 09/10/2012  . Arthritis 09/10/2012  . Screening for breast cancer 09/10/2012  . Insomnia 02/13/2012  . Hypothyroidism 08/15/2011  . Hypertension  08/15/2011  . Depression 08/15/2011    Past Surgical History:  Procedure Laterality Date  . ABDOMINAL HYSTERECTOMY    . CARDIAC CATHETERIZATION  1995   normal, Dr Clayborn Bigness  . COLONOSCOPY  12/29/08  . COLONOSCOPY N/A 04/18/2015   Procedure: COLONOSCOPY;  Surgeon: Lucilla Lame, MD;  Location: [place];  Service: Gastroenterology;  Laterality: N/A;  cecum time- 0957  . FOOT SURGERY    . KNEE ARTHROSCOPY Right 10/05/2015   Procedure: ARTHROSCOPY KNEE, partial lateral menisectomy;  Surgeon: Earnestine Leys, MD;  Location: [facility];  Service: Orthopedics;  Laterality: Right;  . POLYPECTOMY  04/18/2015   Procedure: POLYPECTOMY INTESTINAL;  Surgeon: Lucilla Lame, MD;  Location: [place];  Service: Gastroenterology;;  . WRIST SURGERY      Prior to Admission medications   Medication Sig Start Date End Date Taking? Authorizing Provider  bisoprolol-hydrochlorothiazide [facility]) 10-6.25 MG tablet TAKE ONE (1) TABLET BY MOUTH EVERY DAY 06/18/16   Jackolyn Confer, MD  diclofenac sodium (VOLTAREN) 1 % GEL Apply 4 g topically 4 (four) times daily. 08/09/16   Burnard Hawthorne, FNP  DULoxetine (CYMBALTA) 20 MG capsule Take 20 mg by mouth once a day for one week. Then increase to 20 mg twice a day and stay at that dose thereafter. 12/24/16   Burnard Hawthorne, FNP  erythromycin ophthalmic ointment Place 1 application into the left eye at bedtime.  07/01/15   Jackolyn Confer, MD  levothyroxine (SYNTHROID, LEVOTHROID) 50 MCG tablet TAKE ONE (1) TABLET BY MOUTH EVERY DAY 06/18/16   Jackolyn Confer, MD  losartan (COZAAR) 100 MG tablet TAKE ONE (1) TABLET BY MOUTH EVERY DAY 08/09/16   Burnard Hawthorne, FNP  Multiple Vitamins-Minerals (MULTI COMPLETE PO) Take 1 tablet by mouth. am    Historical Provider, MD  pantoprazole (PROTONIX) 40 MG tablet TAKE ONE (1) TABLET EACH DAY 06/18/16   Jackolyn Confer, MD  valACYclovir (VALTREX) 1000 MG tablet Take 1 tablet (1,000 mg total) by mouth 3 (three) times  daily. For 7 days. 08/17/15   Leone Haven, MD    Allergies Aspirin; Nsaids; and Penicillins  Family History  Problem Relation Age of Onset  . Diabetes Mother   . Hypertension Mother   . Diabetes Sister   . Hypertension Sister     Social History Social History  Substance Use Topics  . Smoking status: Current Every Day Smoker    Packs/day: 0.50    Years: 30.00    Types: Cigarettes  . Smokeless tobacco: Never Used  . Alcohol use 0.0 oz/week     Comment: occassionally/ whiskey on weekend; total of 4 drinks per week    Review of Systems Constitutional: No fever/chills Eyes: No visual changes. ENT: No sore throat. Cardiovascular: +chest pain, worse with deep breaths Respiratory: Denies shortness of breath.  Taking deep breaths makes the chest pain worse. Gastrointestinal: No abdominal pain.  No nausea, no vomiting.  No diarrhea.  No constipation. Genitourinary: Negative for dysuria. Musculoskeletal: Negative for back pain. Skin: Negative for rash. Neurological: Negative for headaches, focal weakness or numbness.  10-point ROS otherwise negative.  ____________________________________________   PHYSICAL EXAM:  VITAL SIGNS: ED Triage Vitals  Enc Vitals Group     BP 02/09/17 0056 (!) 178/84     Pulse Rate 02/09/17 0056 77     Resp 02/09/17 0056 18     Temp 02/09/17 0056 98.4 F (36.9 C)     Temp Source 02/09/17 0056 Oral     SpO2 02/09/17 0056 94 %     Weight 02/09/17 0053 155 lb (70.3 kg)     Height 02/09/17 0053 5\' 6"  (1.676 m)     Head Circumference --      Peak Flow --      Pain Score 02/09/17 0052 9     Pain Loc --      Pain Edu? --      Excl. in [place]? --     Constitutional: Alert and oriented. Well appearing and in no acute distress. Eyes: Conjunctivae are normal. PERRL. EOMI. Head: Atraumatic. Nose: No congestion/rhinnorhea. Mouth/Throat: Mucous membranes are moist. Neck: No stridor.  No meningeal signs.   Cardiovascular: Normal rate, regular  rhythm. Good peripheral circulation. Grossly normal heart sounds. Respiratory: Normal respiratory effort.  No retractions. Lungs CTAB. Gastrointestinal: Soft and nontender. No distention.  Musculoskeletal: No lower extremity tenderness nor edema. No gross deformities of extremities. Neurologic:  Normal speech and language. No gross focal neurologic deficits are appreciated.  Skin:  Skin is warm, dry and intact. No rash noted. Psychiatric: Mood and affect are normal. Speech and behavior are normal.  ____________________________________________   LABS (all labs ordered are listed, but only abnormal results are displayed)  Labs Reviewed  BASIC METABOLIC PANEL - Abnormal; Notable for the following:       Result Value   Potassium 3.1 (*)    Glucose, Bld 159 (*)  Creatinine, Ser 0.33 (*)    Calcium 8.8 (*)    All other components within normal limits  CBC - Abnormal; Notable for the following:    WBC 12.0 (*)    All other components within normal limits  ETHANOL - Abnormal; Notable for the following:    Alcohol, Ethyl (B) 40 (*)    All other components within normal limits  CULTURE, BLOOD (ROUTINE X 2)  CULTURE, BLOOD (ROUTINE X 2)  TROPONIN I  HEPATIC FUNCTION PANEL  LIPASE, BLOOD  PROTIME-INR   ____________________________________________  EKG  ED ECG REPORT I, Joshoa Shawler, the attending physician, personally viewed and interpreted this ECG.  Date: 02/09/2017 EKG Time: 00:55 Rate: 75 Rhythm: normal sinus rhythm QRS Axis: normal Intervals: normal ST/T Wave abnormalities: T wave inversions in leads 2, 3, aVF, V4, V5, V6 with biphasic T-wave in lead V3.   Conduction Disturbances: none Narrative Interpretation: Last EKG on record was in 2010 and the T-wave  Abnormalities listed above were not present at that time.  May represent acute ischemia but does not meet STEMI criteria. ____________________________________________  RADIOLOGY   Dg Chest 2 View  Result Date:  02/09/2017 CLINICAL DATA:  Central chest pain for several hours tonight EXAM: CHEST  2 VIEW COMPARISON:  11/06/2006 FINDINGS: Streaky opacities and interstitial thickening in the lung bases bilaterally, without confluent airspace consolidation. No pleural effusions. Hilar and mediastinal contours are unremarkable. IMPRESSION: Basilar interstitial thickening and streaky lung base opacities. This could represent atypical pneumonia. No consolidation or effusion. Electronically Signed   By: Andreas Newport M.D.   On: 02/09/2017 01:24    ____________________________________________   PROCEDURES  Procedure(s) performed:   .Critical Care Performed by: Hinda Kehr Authorized by: Hinda Kehr   Critical care provider statement:    Critical care time (minutes):  30   Critical care time was exclusive of:  Separately billable procedures and treating other patients   Critical care was time spent personally by me on the following activities:  Development of treatment plan with patient or surrogate, discussions with consultants, evaluation of patient's response to treatment, examination of patient, obtaining history from patient or surrogate, ordering and performing treatments and interventions, ordering and review of laboratory studies, ordering and review of radiographic studies, pulse oximetry, re-evaluation of patient's condition and review of old charts     Critical Care performed: Yes, see critical care procedure note(s) ____________________________________________   INITIAL IMPRESSION / [place] / ED COURSE  Pertinent labs & imaging results that were available during my care of the patient were reviewed by me and considered in my medical decision making (see chart for details).  The patient is hypertensive and complaining of pain that may well be anginal.  Her last EKG on record was from 8 years ago but she has some significant T-wave changes since that time which may represent  acute ischemia.  I will give her sublingual nitroglycerin to assess whether this helps her with her pain and we are checking the standard labs.  I added on an ethanol level as well.  Her HEART score is 7, however, which is high risk, and I will likely need to bring her in for chest pain observation / unstable angina even if the first troponin is negative.   Clinical Course as of Feb 10 228  Sat Feb 09, 2017  0127 Questionable atypical pneumonia DG Chest 2 View [CF]  0219 Pain improved slightly with 1 nitro SL.  BP improved.  Troponin negative but  symptoms most consistent with unstable angina, particularly given EKG changes (T wave inversions).    [CF]  0227 Given the patient's EKG changes, ongoing chest pain that improves slightly with nitroglycerin, and numerous risk factors, I will start heparin for unstable angina.  I have also ordered azithromycin for possible atypical pneumonia and we have obtained blood cultures.  I will also order ceftriaxone 1 g although she has a listed allergy to penicillins it is only itching and rash and I strongly doubt the probability of cross-reactivity.  Discussed the case with the hospitalist.  [CF]    Clinical Course User Index [CF] Hinda Kehr, MD    ____________________________________________  FINAL CLINICAL IMPRESSION(S) / ED DIAGNOSES  Final diagnoses:  Unstable angina (HCC)  Atypical pneumonia     MEDICATIONS GIVEN DURING THIS VISIT:  Medications  nitroGLYCERIN (NITROSTAT) SL tablet 0.4 mg (0.4 mg Sublingual Given 02/09/17 0156)  azithromycin (ZITHROMAX) 500 mg in dextrose 5 % 250 mL IVPB (not administered)  potassium chloride SA (K-DUR,KLOR-CON) CR tablet 40 mEq (not administered)  heparin injection 4,000 Units (not administered)  aspirin chewable tablet 243 mg (not administered)  cefTRIAXone (ROCEPHIN) 1 g in dextrose 5 % 50 mL IVPB (not administered)     NEW OUTPATIENT MEDICATIONS STARTED DURING THIS VISIT:  New Prescriptions   No  medications on file    Modified Medications   No medications on file    Discontinued Medications   No medications on file     Note:  This document was prepared using Dragon voice recognition software and may include unintentional dictation errors.    Hinda Kehr, MD 02/09/17 [phone]

## 2017-02-09 NOTE — Progress Notes (Signed)
[facility] [place] NAME: Lindsay [name]    MR#:  [id number]  DATE OF BIRTH:  Jan 18, 1941  SUBJECTIVE:  CHIEF COMPLAINT:   Chief Complaint  Patient presents with  . Chest Pain   Came with Chest pain, was awake whole night, and received one Mg oral ativan- so sleepy when I saw. No more pain.   On heparin as EKG changes.  REVIEW OF SYSTEMS:  CONSTITUTIONAL: No fever, fatigue or weakness.  EYES: No blurred or double vision.  EARS, NOSE, AND THROAT: No tinnitus or ear pain.  RESPIRATORY: No cough, shortness of breath, wheezing or hemoptysis.  CARDIOVASCULAR: No chest pain, orthopnea, edema.  GASTROINTESTINAL: No nausea, vomiting, diarrhea or abdominal pain.  GENITOURINARY: No dysuria, hematuria.  ENDOCRINE: No polyuria, nocturia,  HEMATOLOGY: No anemia, easy bruising or bleeding SKIN: No rash or lesion. MUSCULOSKELETAL: No joint pain or arthritis.   NEUROLOGIC: No tingling, numbness, weakness.  PSYCHIATRY: No anxiety or depression.   ROS  DRUG ALLERGIES:   Allergies  Allergen Reactions  . Aspirin Other (See Comments)    GI BLEED  . Nsaids Other (See Comments)    GI BLEED  . Penicillins Itching, Rash and Other (See Comments)    Has patient had a PCN reaction causing immediate rash, facial/tongue/throat swelling, SOB or lightheadedness with hypotension: Yes Has patient had a PCN reaction causing severe rash involving mucus membranes or skin necrosis: No Has patient had a PCN reaction that required hospitalization No Has patient had a PCN reaction occurring within the last 10 years: No If all of the above answers are "NO", then may proceed with Cephalosporin use.     VITALS:  Blood pressure (!) 158/81, pulse 83, temperature 98.6 F (37 C), temperature source Oral, resp. rate 20, height 5\' 6"  (1.676 m), weight 69.9 kg (154 lb 3.2 oz), SpO2 90 %.  PHYSICAL EXAMINATION:  GENERAL:  76 y.o.-year-old patient lying in the bed with no  acute distress.  EYES: Pupils equal, round, reactive to light and accommodation. No scleral icterus. Extraocular muscles intact.  HEENT: Head atraumatic, normocephalic. Oropharynx and nasopharynx clear.  NECK:  Supple, no jugular venous distention. No thyroid enlargement, no tenderness.  LUNGS: Normal breath sounds bilaterally, no wheezing, rales,rhonchi or crepitation. No use of accessory muscles of respiration.  CARDIOVASCULAR: S1, S2 normal. No murmurs, rubs, or gallops.  ABDOMEN: Soft, nontender, nondistended. Bowel sounds present. No organomegaly or mass.  EXTREMITIES: No pedal edema, cyanosis, or clubbing.  NEUROLOGIC: Cranial nerves II through XII are intact. Muscle strength 5/5 in all extremities. Sensation intact. Gait not checked.  PSYCHIATRIC: The patient is alert and oriented x 3.  SKIN: No obvious rash, lesion, or ulcer.   Physical Exam LABORATORY PANEL:   CBC  Recent Labs Lab 02/09/17 0059  WBC 12.0*  HGB 14.2  HCT 41.1  PLT 197   ------------------------------------------------------------------------------------------------------------------  Chemistries   Recent Labs Lab 02/09/17 0059  NA 142  K 3.1*  CL 104  CO2 27  GLUCOSE 159*  BUN 9  CREATININE 0.33*  CALCIUM 8.8*  AST 21  ALT 17  ALKPHOS 48  BILITOT 0.6   ------------------------------------------------------------------------------------------------------------------  Cardiac Enzymes  Recent Labs Lab 02/09/17 0425 02/09/17 1012  TROPONINI <0.03 <0.03   ------------------------------------------------------------------------------------------------------------------  RADIOLOGY:  Dg Chest 2 View  Result Date: 02/09/2017 CLINICAL DATA:  Central chest pain for several hours tonight EXAM: CHEST  2 VIEW COMPARISON:  11/06/2006 FINDINGS: Streaky opacities and interstitial thickening in the lung  bases bilaterally, without confluent airspace consolidation. No pleural effusions. Hilar and  mediastinal contours are unremarkable. IMPRESSION: Basilar interstitial thickening and streaky lung base opacities. This could represent atypical pneumonia. No consolidation or effusion. Electronically Signed   By: Andreas Newport M.D.   On: 02/09/2017 01:24    ASSESSMENT AND PLAN:   Active Problems:   Unstable angina ([place])  1. Chest pain: Atypical in duration although radiation to neck and arm are features typical of anginal pain. EKG changes in ongoing chest pain warrant systemic anticoagulation. Seen by cardiologist, suggested Out pt stress test. Pt still have some heaviness, found to have finding concerning of Pneumonia on Xray. 2. Pneumonia: Atypical. The patient has received azithromycin in the emergency department.   Will give rocephin+ azithromycin. 3. Hypertension: Controlled; Continue bisoprolol, hydrochlorothiazide and valsartan. Labetalol as needed. 4. Hypothyroidism: Continue Synthroid; check TSH 5. Alcohol abuse: Initiate CIWA scale. Ativan as needed.   As per son she does not drink much. 6. DVT prophylaxis: As above 7. GI prophylaxis: PPI per home regimen   All the records are reviewed and case discussed with Care Management/Social Workerr. Management plans discussed with the patient, family and they are in agreement.  CODE STATUS: Full.  TOTAL TIME TAKING CARE OF THIS PATIENT: 35 minutes.   POSSIBLE D/C IN 1-2 DAYS, DEPENDING ON CLINICAL CONDITION.   Vaughan Basta M.D on 02/09/2017   Between 7am to 6pm - Pager - [phone]  After 6pm go to [email or web address] - password [email or web address] [facility]  Office  [phone]  CC: Primary care physician; Mable Paris, FNP  Note: This dictation was prepared with Dragon dictation along with smaller phrase technology. Any transcriptional errors that result from this process are unintentional.

## 2017-02-09 NOTE — ED Triage Notes (Addendum)
Pt to triage via wheelchair. Pt reports pain to the center of her chest for about 3 to 4 hours. Pt reports the pain radiated into the left side of her neck at one point but does not now. Pt denies n/v, diaphoresis or shortness of breath but reports it is difficulty to take a deep breath. Pt reports she was out having "a few cocktails with a friends" when this occurred. Pt reports she took 1 baby aspirin around 9 pm.

## 2017-02-09 NOTE — H&P (Signed)
Lindsay [name] is an 76 y.o. female.   Chief Complaint: Chest pain HPI: The patient with past medical history of hypertension and hypothyroidism presents to emergency department complaining of chest pain. It began at rest approximately 4 hours prior to admission. She states that the pain is sharp in character and radiates to her left shoulder and into her jaw. She admits to occasional shortness of breath due to respirations being limited by pain on her right side. She admits to nausea and diaphoresis but only after taking nitroglycerin in the emergency department. She did not receive aspirin due to reported allergy. The patient continued to have chest pain is by negative cardiac enzymes. However the patient has lateral EKG changes not present on EKG from 8 years ago. Due to her ongoing pain and EKG changes the emergency department staff initiated heparin therapy. Thus the hospitalist service was called for further management.  Past Medical History:  Diagnosis Date  . Anemia   . Anemia   . Anxiety   . Arthritis    osteo, back  . Depression   . Diverticulosis   . GERD (gastroesophageal reflux disease)   . Hematochezia   . Hypertension    controlled on meds  . Hypothyroidism   . Rectal bleeding     Past Surgical History:  Procedure Laterality Date  . ABDOMINAL HYSTERECTOMY    . CARDIAC CATHETERIZATION  1995   normal, Dr Clayborn Bigness  . COLONOSCOPY  12/29/08  . COLONOSCOPY N/A 04/18/2015   Procedure: COLONOSCOPY;  Surgeon: Lucilla Lame, MD;  Location: [place];  Service: Gastroenterology;  Laterality: N/A;  cecum time- 0957  . FOOT SURGERY    . KNEE ARTHROSCOPY Right 10/05/2015   Procedure: ARTHROSCOPY KNEE, partial lateral menisectomy;  Surgeon: Earnestine Leys, MD;  Location: [facility];  Service: Orthopedics;  Laterality: Right;  . POLYPECTOMY  04/18/2015   Procedure: POLYPECTOMY INTESTINAL;  Surgeon: Lucilla Lame, MD;  Location: [place];  Service: Gastroenterology;;  .  WRIST SURGERY      Family History  Problem Relation Age of Onset  . Diabetes Mother   . Hypertension Mother   . Diabetes Sister   . Hypertension Sister    Social History:  reports that she has been smoking Cigarettes.  She has a 15.00 pack-year smoking history. She has never used smokeless tobacco. She reports that she drinks alcohol. She reports that she does not use drugs.  Allergies:  Allergies  Allergen Reactions  . Aspirin Other (See Comments)    GI BLEED  . Nsaids Other (See Comments)    GI BLEED  . Penicillins Itching, Rash and Other (See Comments)    Has patient had a PCN reaction causing immediate rash, facial/tongue/throat swelling, SOB or lightheadedness with hypotension: Yes Has patient had a PCN reaction causing severe rash involving mucus membranes or skin necrosis: No Has patient had a PCN reaction that required hospitalization No Has patient had a PCN reaction occurring within the last 10 years: No If all of the above answers are "NO", then may proceed with Cephalosporin use.     Medications Prior to Admission  Medication Sig Dispense Refill  . bisoprolol-hydrochlorothiazide (ZIAC) 10-6.25 MG tablet TAKE ONE (1) TABLET BY MOUTH EVERY DAY 90 tablet 3  . diclofenac sodium (VOLTAREN) 1 % GEL Apply 4 g topically 4 (four) times daily. 1 Tube 3  . DULoxetine (CYMBALTA) 20 MG capsule Take 20 mg by mouth once a day for one week. Then increase to 20 mg  twice a day and stay at that dose thereafter. 60 capsule 2  . erythromycin ophthalmic ointment Place 1 application into the left eye at bedtime. 3.5 g 0  . levothyroxine (SYNTHROID, LEVOTHROID) 50 MCG tablet TAKE ONE (1) TABLET BY MOUTH EVERY DAY 90 tablet 3  . losartan (COZAAR) 100 MG tablet TAKE ONE (1) TABLET BY MOUTH EVERY DAY 90 tablet 3  . Multiple Vitamins-Minerals (MULTI COMPLETE PO) Take 1 tablet by mouth. am    . pantoprazole (PROTONIX) 40 MG tablet TAKE ONE (1) TABLET EACH DAY 90 tablet 3  . valACYclovir  (VALTREX) 1000 MG tablet Take 1 tablet (1,000 mg total) by mouth 3 (three) times daily. For 7 days. 21 tablet 0    Results for orders placed or performed during the hospital encounter of 02/09/17 (from the past 48 hour(s))  Basic metabolic panel     Status: Abnormal   Collection Time: 02/09/17 12:59 AM  Result Value Ref Range   Sodium 142 135 - 145 mmol/L   Potassium 3.1 (L) 3.5 - 5.1 mmol/L   Chloride 104 101 - 111 mmol/L   CO2 27 22 - 32 mmol/L   Glucose, Bld 159 (H) 65 - 99 mg/dL   BUN 9 6 - 20 mg/dL   Creatinine, Ser 0.33 (L) 0.44 - 1.00 mg/dL   Calcium 8.8 (L) 8.9 - 10.3 mg/dL   GFR calc non Af Amer >60 >60 mL/min   GFR calc Af Amer >60 >60 mL/min    Comment: (NOTE) The eGFR has been calculated using the CKD EPI equation. This calculation has not been validated in all clinical situations. eGFR's persistently <60 mL/min signify possible Chronic Kidney Disease.    Anion gap 11 5 - 15  CBC     Status: Abnormal   Collection Time: 02/09/17 12:59 AM  Result Value Ref Range   WBC 12.0 (H) 3.6 - 11.0 K/uL   RBC 4.71 3.80 - 5.20 MIL/uL   Hemoglobin 14.2 12.0 - 16.0 g/dL   HCT 41.1 35.0 - 47.0 %   MCV 87.3 80.0 - 100.0 fL   MCH 30.2 26.0 - 34.0 pg   MCHC 34.6 32.0 - 36.0 g/dL   RDW 13.8 11.5 - 14.5 %   Platelets 197 150 - 440 K/uL  Troponin I     Status: None   Collection Time: 02/09/17 12:59 AM  Result Value Ref Range   Troponin I <0.03 <0.03 ng/mL  Hepatic function panel     Status: None   Collection Time: 02/09/17 12:59 AM  Result Value Ref Range   Total Protein 7.3 6.5 - 8.1 g/dL   Albumin 4.0 3.5 - 5.0 g/dL   AST 21 15 - 41 U/L   ALT 17 14 - 54 U/L   Alkaline Phosphatase 48 38 - 126 U/L   Total Bilirubin 0.6 0.3 - 1.2 mg/dL   Bilirubin, Direct 0.1 0.1 - 0.5 mg/dL   Indirect Bilirubin 0.5 0.3 - 0.9 mg/dL  Lipase, blood     Status: None   Collection Time: 02/09/17 12:59 AM  Result Value Ref Range   Lipase 21 11 - 51 U/L  Ethanol     Status: Abnormal   Collection  Time: 02/09/17  1:47 AM  Result Value Ref Range   Alcohol, Ethyl (B) 40 (H) <5 mg/dL    Comment:        LOWEST DETECTABLE LIMIT FOR SERUM ALCOHOL IS 5 mg/dL FOR MEDICAL PURPOSES ONLY   Blood culture (routine x 2)  Status: None (Preliminary result)   Collection Time: 02/09/17  2:39 AM  Result Value Ref Range   Specimen Description BLOOD LEFT ANTECUBITAL    Special Requests BOTTLES DRAWN AEROBIC AND ANAEROBIC BCAV    Culture NO GROWTH < 12 HOURS    Report Status PENDING   Blood culture (routine x 2)     Status: None (Preliminary result)   Collection Time: 02/09/17  2:39 AM  Result Value Ref Range   Specimen Description BLOOD RIGHT ANTECUBITAL    Special Requests BOTTLES DRAWN AEROBIC AND ANAEROBIC BCAV    Culture NO GROWTH < 12 HOURS    Report Status PENDING   Protime-INR     Status: None   Collection Time: 02/09/17  3:08 AM  Result Value Ref Range   Prothrombin Time 12.5 11.4 - 15.2 seconds   INR 0.93   APTT     Status: None   Collection Time: 02/09/17  3:08 AM  Result Value Ref Range   aPTT 26 24 - 36 seconds  TSH     Status: Abnormal   Collection Time: 02/09/17  4:25 AM  Result Value Ref Range   TSH 0.297 (L) 0.350 - 4.500 uIU/mL    Comment: Performed by a 3rd Generation assay with a functional sensitivity of <=0.01 uIU/mL.  Troponin I     Status: None   Collection Time: 02/09/17  4:25 AM  Result Value Ref Range   Troponin I <0.03 <0.03 ng/mL   Dg Chest 2 View  Result Date: 02/09/2017 CLINICAL DATA:  Central chest pain for several hours tonight EXAM: CHEST  2 VIEW COMPARISON:  11/06/2006 FINDINGS: Streaky opacities and interstitial thickening in the lung bases bilaterally, without confluent airspace consolidation. No pleural effusions. Hilar and mediastinal contours are unremarkable. IMPRESSION: Basilar interstitial thickening and streaky lung base opacities. This could represent atypical pneumonia. No consolidation or effusion. Electronically Signed   By: Andreas Newport M.D.   On: 02/09/2017 01:24    Review of Systems  Constitutional: Positive for diaphoresis. Negative for chills and fever.  HENT: Negative for sore throat and tinnitus.   Eyes: Negative for blurred vision and redness.  Respiratory: Negative for cough and shortness of breath.   Cardiovascular: Positive for chest pain. Negative for palpitations, orthopnea and PND.  Gastrointestinal: Positive for nausea. Negative for abdominal pain, diarrhea and vomiting.  Genitourinary: Negative for dysuria, frequency and urgency.  Musculoskeletal: Negative for joint pain and myalgias.  Skin: Negative for rash.       No lesions  Neurological: Negative for speech change, focal weakness and weakness.  Endo/Heme/Allergies: Does not bruise/bleed easily.       No temperature intolerance  Psychiatric/Behavioral: Negative for depression and suicidal ideas.    Blood pressure (!) 143/75, pulse 66, temperature 98.2 F (36.8 C), temperature source Oral, resp. rate 16, height 5' 6" (1.676 m), weight 69.9 kg (154 lb 3.2 oz), SpO2 92 %. Physical Exam  Vitals reviewed. Constitutional: She is oriented to person, place, and time. She appears well-developed and well-nourished. No distress.  HENT:  Head: Normocephalic and atraumatic.  Mouth/Throat: Oropharynx is clear and moist.  Eyes: Conjunctivae and EOM are normal. Pupils are equal, round, and reactive to light. No scleral icterus.  Neck: Normal range of motion. Neck supple. No JVD present. No tracheal deviation present. No thyromegaly present.  Cardiovascular: Normal rate, regular rhythm and normal heart sounds.  Exam reveals no gallop and no friction rub.   No murmur heard. Respiratory: Effort normal and  breath sounds normal.  GI: Soft. Bowel sounds are normal. She exhibits no distension. There is no tenderness.  Genitourinary:  Genitourinary Comments: Deferred  Musculoskeletal: Normal range of motion. She exhibits no edema.  Lymphadenopathy:    She  has no cervical adenopathy.  Neurological: She is alert and oriented to person, place, and time. No cranial nerve deficit. She exhibits normal muscle tone.  Skin: Skin is warm and dry. No rash noted. No erythema.  Psychiatric: She has a normal mood and affect. Her behavior is normal. Judgment and thought content normal.     Assessment/Plan This is a 76 year old female admitted for chest pain area 1. Chest pain: Atypical in duration although radiation to neck and arm are features typical of anginal pain. EKG changes in ongoing chest pain warrant systemic anticoagulation. I have started heparin without bolus due to history of GI bleed. Continue to follow troponin and monitor telemetry area and I've made the patient nothing by mouth. Cardiology is consulted. Nitroglycerin as needed for chest pain 2. Pneumonia: Atypical. The patient has received azithromycin in the emergency department. Due to reported increased risk of arrhythmia secondary to macrolides in the presence of CAD I have discontinued azithromycin. She has received ceftriaxone in the emergency department. She does not meet criteria for sepsis at this time thus I have switched the patient to oral doxycycline. No hypoxia. 3. Hypertension: Controlled; Continue bisoprolol, hydrochlorothiazide and valsartan. Labetalol as needed. 4. Hypothyroidism: Continue Synthroid; check TSH 5. Alcohol abuse: Initiate CIWA scale. Ativan as needed. 6. DVT prophylaxis: As above 7. GI prophylaxis: PPI per home regimen The patient is a full code. Time spent on admission orders and patient care approximately 45 minutes  Harrie Foreman, MD 02/09/2017, 7:07 AM

## 2017-02-09 NOTE — Progress Notes (Signed)
ANTICOAGULATION CONSULT NOTE - FOLLOW UP   Pharmacy Consult for heparin drip Indication: chest pain/ACS  Allergies  Allergen Reactions  . Aspirin Other (See Comments)    GI BLEED  . Nsaids Other (See Comments)    GI BLEED  . Penicillins Itching, Rash and Other (See Comments)    Has patient had a PCN reaction causing immediate rash, facial/tongue/throat swelling, SOB or lightheadedness with hypotension: Yes Has patient had a PCN reaction causing severe rash involving mucus membranes or skin necrosis: No Has patient had a PCN reaction that required hospitalization No Has patient had a PCN reaction occurring within the last 10 years: No If all of the above answers are "NO", then may proceed with Cephalosporin use.     Patient Measurements: Height: 5\' 6"  (167.6 cm) Weight: 154 lb 3.2 oz (69.9 kg) IBW/kg (Calculated) : 59.3 Heparin Dosing Weight: 70kg  Vital Signs: Temp: 98.6 F (37 C) (03/10 1137) Temp Source: Oral (03/10 1137) BP: 158/81 (03/10 1137) Pulse Rate: 83 (03/10 1137)  Labs:  Recent Labs  02/09/17 0059 02/09/17 0308 02/09/17 0425 02/09/17 1012 02/09/17 1304  HGB 14.2  --   --   --   --   HCT 41.1  --   --   --   --   PLT 197  --   --   --   --   APTT  --  26  --   --   --   LABPROT  --  12.5  --   --   --   INR  --  0.93  --   --   --   HEPARINUNFRC  --   --   --   --  0.25*  CREATININE 0.33*  --   --   --   --   TROPONINI <0.03  --  <0.03 <0.03  --     Estimated Creatinine Clearance: 58.6 mL/min (by C-G formula based on SCr of 0.33 mg/dL (L)).   Medical History: Past Medical History:  Diagnosis Date  . Anemia   . Anxiety   . Arthritis    osteo, back  . Depression   . Diverticulosis   . GERD (gastroesophageal reflux disease)   . Hematochezia   . Hypertension    controlled on meds  . Hypothyroidism     Medications:  No anticoagulation in PTA meds.  Assessment:  Goal of Therapy:  Heparin level 0.3-0.7 units/ml Monitor platelets by  anticoagulation protocol: Yes   Plan:  850 units/hr initial rate. No bolus per hospitalist due to history of bleeding issues. First heparin level 8 hours after start of infusion.  3/10: Heparin level subtherapeutic. Will increase heparin gtt to 1000 units/hr. Will recheck Heparin level @ 22:30.    Hagop Mccollam D 02/09/2017,2:05 PM

## 2017-02-09 NOTE — Consult Note (Signed)
CARDIOLOGY CONSULT NOTE  Patient ID: Lindsay [name] MRN: [id number] DOB/AGE: [birth year] 76 y.o.  Admit date: 02/09/2017 Primary Physician Mable Paris, FNP Primary Cardiologist None Chief Complaint  Chest pain Requesting  Dr. Anselm Jungling  HPI:   The patient has no past cardiac history other than a normal cath greater than 20 years ago.  She is very somnolent currently having been given benzodiazapines a short time ago.  However, I was able to get her to wake up.  She reports a left lower chest pain that is sharp and made worse with deep inspiration and certain positions.  She reports that it radiates slightly through to the back.  She has had some fever by her report that this is vague.  She did get NTG but it is not clear that this improved her symptoms.  She was treated with GI cocktail.  She did not have radiation to her jaw or to her arms.  She has not had new SOB, PND or orthopnea.  She is being treated with antibiotics.  EKG did demonstrate inferior and lateral T wave inversion .   She has no EKGs in the system since 2010 for comparison.  She reports that she walks the track routinely for exercise and she has no problems with this.  Enzymes this far are negative x 2.   She does smoke cigarettes.     Past Medical History:  Diagnosis Date  . Anemia   . Anemia   . Anxiety   . Arthritis    osteo, back  . Depression   . Diverticulosis   . GERD (gastroesophageal reflux disease)   . Hematochezia   . Hypertension    controlled on meds  . Hypothyroidism   . Rectal bleeding     Past Surgical History:  Procedure Laterality Date  . ABDOMINAL HYSTERECTOMY    . CARDIAC CATHETERIZATION  1995   normal, Dr Clayborn Bigness  . COLONOSCOPY  12/29/08  . COLONOSCOPY N/A 04/18/2015   Procedure: COLONOSCOPY;  Surgeon: Lucilla Lame, MD;  Location: [place];  Service: Gastroenterology;  Laterality: N/A;  cecum time- 0957  . FOOT SURGERY    . KNEE ARTHROSCOPY Right 10/05/2015   Procedure:  ARTHROSCOPY KNEE, partial lateral menisectomy;  Surgeon: Earnestine Leys, MD;  Location: [facility];  Service: Orthopedics;  Laterality: Right;  . POLYPECTOMY  04/18/2015   Procedure: POLYPECTOMY INTESTINAL;  Surgeon: Lucilla Lame, MD;  Location: [place];  Service: Gastroenterology;;  . WRIST SURGERY      Allergies  Allergen Reactions  . Aspirin Other (See Comments)    GI BLEED  . Nsaids Other (See Comments)    GI BLEED  . Penicillins Itching, Rash and Other (See Comments)    Has patient had a PCN reaction causing immediate rash, facial/tongue/throat swelling, SOB or lightheadedness with hypotension: Yes Has patient had a PCN reaction causing severe rash involving mucus membranes or skin necrosis: No Has patient had a PCN reaction that required hospitalization No Has patient had a PCN reaction occurring within the last 10 years: No If all of the above answers are "NO", then may proceed with Cephalosporin use.    Prescriptions Prior to Admission  Medication Sig Dispense Refill Last Dose  . bisoprolol-hydrochlorothiazide (ZIAC) 10-6.25 MG tablet TAKE ONE (1) TABLET BY MOUTH EVERY DAY 90 tablet 3 Taking  . diclofenac sodium (VOLTAREN) 1 % GEL Apply 4 g topically 4 (four) times daily. 1 Tube 3 Taking  . DULoxetine (CYMBALTA) 20 MG capsule Take  20 mg by mouth once a day for one week. Then increase to 20 mg twice a day and stay at that dose thereafter. 60 capsule 2   . erythromycin ophthalmic ointment Place 1 application into the left eye at bedtime. 3.5 g 0 Taking  . levothyroxine (SYNTHROID, LEVOTHROID) 50 MCG tablet TAKE ONE (1) TABLET BY MOUTH EVERY DAY 90 tablet 3 Taking  . losartan (COZAAR) 100 MG tablet TAKE ONE (1) TABLET BY MOUTH EVERY DAY 90 tablet 3 Taking  . Multiple Vitamins-Minerals (MULTI COMPLETE PO) Take 1 tablet by mouth. am   Taking  . pantoprazole (PROTONIX) 40 MG tablet TAKE ONE (1) TABLET EACH DAY 90 tablet 3 Taking  . valACYclovir (VALTREX) 1000 MG tablet Take 1  tablet (1,000 mg total) by mouth 3 (three) times daily. For 7 days. 21 tablet 0 Taking   Family History  Problem Relation Age of Onset  . Diabetes Mother   . Hypertension Mother   . Diabetes Sister   . Hypertension Sister     Social History   Social History  . Marital status: Married    Spouse name: N/A  . Number of children: N/A  . Years of education: N/A   Occupational History  . Not on file.   Social History Main Topics  . Smoking status: Current Every Day Smoker    Packs/day: 0.50    Years: 30.00    Types: Cigarettes  . Smokeless tobacco: Never Used  . Alcohol use 0.0 oz/week     Comment: occassionally/ whiskey on weekend; total of 4 drinks per week  . Drug use: No  . Sexual activity: Not Currently   Other Topics Concern  . Not on file   Social History Narrative   From [place].   [employer]- 20 hours week.    Husband in nursing home in [place].    1 child; 3 step children            Exercise- not excercising due to right knee     ROS:  Difficult to obtain as the patient is falling asleep.    But appears to be as stated in the HPI and negative for all other systems.  Physical Exam: Blood pressure (!) 158/81, pulse 83, temperature 98.6 F (37 C), temperature source Oral, resp. rate 20, height 5\' 6"  (1.676 m), weight 154 lb 3.2 oz (69.9 kg), SpO2 90 %.  HEENT:  Pupils equal round and reactive, fundi not visualized, oral mucosa unremarkable NECK:  No jugular venous distention, waveform within normal limits, carotid upstroke brisk and symmetric, no bruits, no thyromegaly LYMPHATICS:  No cervical, inguinal adenopathy LUNGS:  Clear to auscultation bilaterally BACK:  No CVA tenderness CHEST:  Unremarkable HEART:  PMI not displaced or sustained,S1 and S2 within normal limits, no S3, no S4, no clicks, no rubs, no murmurs ABD:  Flat, positive bowel sounds normal in frequency in pitch, no bruits, no rebound, no guarding, no midline pulsatile  mass, no hepatomegaly, no splenomegaly EXT:  2 plus pulses throughout, no edema, no cyanosis no clubbing SKIN:  No rashes no nodules NEURO:  Cranial nerves II through XII grossly intact, motor grossly intact throughout PSYCH:  Cognitively intact, oriented to person place and time   Labs: Lab Results  Component Value Date   BUN 9 02/09/2017   Lab Results  Component Value Date   CREATININE 0.33 (L) 02/09/2017   Lab Results  Component Value Date   NA 142 02/09/2017  K 3.1 (L) 02/09/2017   CL 104 02/09/2017   CO2 27 02/09/2017   Lab Results  Component Value Date   TROPONINI <0.03 02/09/2017   Lab Results  Component Value Date   WBC 12.0 (H) 02/09/2017   HGB 14.2 02/09/2017   HCT 41.1 02/09/2017   MCV 87.3 02/09/2017   PLT 197 02/09/2017   Lab Results  Component Value Date   CHOL 219 (H) 07/07/2014   HDL 71.50 07/07/2014   LDLCALC 125 (H) 07/07/2014   LDLDIRECT 130.6 08/21/2013   TRIG 114.0 07/07/2014   CHOLHDL 3 07/07/2014   Lab Results  Component Value Date   ALT 17 02/09/2017   AST 21 02/09/2017   ALKPHOS 48 02/09/2017   BILITOT 0.6 02/09/2017      Radiology:   CXR: Streaky opacities and interstitial thickening in the lung bases bilaterally, without confluent airspace consolidation. No pleural effusions. Hilar and mediastinal contours are unremarkable.  EKG:NSR, rate 75, axis WNL, borderline QT prolongation.  Diffuse inferior and lateral T wave inversion, possibly repolarization.  LVH criteria.   T wave changes are new compared with the previous 2010 EKG.   ASSESSMENT AND PLAN:   CHEST PAIN:    Her chest pain has atypical greater than typical features.  She has had no enzyme elevation.  Her HEART score is 4.  She needs stress testing for screening but this could be done as an out patient.  Her family would prefer this.  Repeat an EKG in the AM.   HTN:  Continue current therapy.  She needs BP diary to follow up at home as she likely needs adjustment.   Check an echo as an out patient to evaluate for possible LVH.     SignedMinus Breeding 02/09/2017, 11:53 AM   -

## 2017-02-09 NOTE — Progress Notes (Signed)
ANTICOAGULATION CONSULT NOTE - Initial Consult  Pharmacy Consult for heparin drip Indication: chest pain/ACS  Allergies  Allergen Reactions  . Aspirin Other (See Comments)    GI BLEED  . Nsaids Other (See Comments)    GI BLEED  . Penicillins Itching, Rash and Other (See Comments)    Has patient had a PCN reaction causing immediate rash, facial/tongue/throat swelling, SOB or lightheadedness with hypotension: Yes Has patient had a PCN reaction causing severe rash involving mucus membranes or skin necrosis: No Has patient had a PCN reaction that required hospitalization No Has patient had a PCN reaction occurring within the last 10 years: No If all of the above answers are "NO", then may proceed with Cephalosporin use.     Patient Measurements: Height: 5\' 6"  (167.6 cm) Weight: 155 lb (70.3 kg) IBW/kg (Calculated) : 59.3 Heparin Dosing Weight: 70kg  Vital Signs: Temp: 98.4 F (36.9 C) (03/10 0056) Temp Source: Oral (03/10 0056) BP: 139/99 (03/10 0315) Pulse Rate: 74 (03/10 0315)  Labs:  Recent Labs  02/09/17 0059 02/09/17 0308  HGB 14.2  --   HCT 41.1  --   PLT 197  --   APTT  --  26  LABPROT  --  12.5  INR  --  0.93  CREATININE 0.33*  --   TROPONINI <0.03  --     Estimated Creatinine Clearance: 58.6 mL/min (by C-G formula based on SCr of 0.33 mg/dL (L)).   Medical History: Past Medical History:  Diagnosis Date  . Anemia   . Anemia   . Anxiety   . Arthritis    osteo, back  . Depression   . Diverticulosis   . GERD (gastroesophageal reflux disease)   . Hematochezia   . Hypertension    controlled on meds  . Hypothyroidism   . Rectal bleeding     Medications:  No anticoagulation in PTA meds.  Assessment:  Goal of Therapy:  Heparin level 0.3-0.7 units/ml Monitor platelets by anticoagulation protocol: Yes   Plan:  850 units/hr initial rate. No bolus per hospitalist due to history of bleeding issues. First heparin level 8 hours after start of  infusion.  Lindsay [name] S 02/09/2017,3:37 AM

## 2017-02-10 DIAGNOSIS — R0789 Other chest pain: Secondary | ICD-10-CM | POA: Diagnosis not present

## 2017-02-10 DIAGNOSIS — I10 Essential (primary) hypertension: Secondary | ICD-10-CM | POA: Diagnosis not present

## 2017-02-10 DIAGNOSIS — J189 Pneumonia, unspecified organism: Secondary | ICD-10-CM | POA: Diagnosis not present

## 2017-02-10 DIAGNOSIS — E039 Hypothyroidism, unspecified: Secondary | ICD-10-CM | POA: Diagnosis not present

## 2017-02-10 LAB — HEMOGLOBIN A1C
Hgb A1c MFr Bld: 5.2 % (ref 4.8–5.6)
Mean Plasma Glucose: 103 mg/dL

## 2017-02-10 MED ORDER — THIAMINE HCL 100 MG PO TABS: 100.0000 mg | ORAL_TABLET | Freq: Every day | ORAL | 0 refills | Status: DC

## 2017-02-10 MED ORDER — FOLIC ACID 1 MG PO TABS: 1.0000 mg | ORAL_TABLET | Freq: Every day | ORAL | 0 refills | Status: DC

## 2017-02-10 MED ORDER — CEFUROXIME AXETIL 250 MG PO TABS: 250.0000 mg | ORAL_TABLET | Freq: Two times a day (BID) | ORAL | 0 refills | Status: AC

## 2017-02-10 MED ORDER — AZITHROMYCIN 250 MG PO TABS: 250.0000 mg | ORAL_TABLET | Freq: Every day | ORAL | 0 refills | Status: AC

## 2017-02-10 NOTE — Discharge Summary (Signed)
[facility] [place] NAME: Lindsay [name]    MR#:  [id number]  DATE OF BIRTH:  04/17/1941  DATE OF ADMISSION:  02/09/2017 ADMITTING PHYSICIAN: Harrie Foreman, MD  DATE OF DISCHARGE: 02/10/2017  PRIMARY CARE PHYSICIAN: Mable Paris, FNP    ADMISSION DIAGNOSIS:  Unstable angina ([place]) [I20.0] Atypical pneumonia [J18.9]  DISCHARGE DIAGNOSIS:  Active Problems:   Unstable angina ([place])    Pneumonia  SECONDARY DIAGNOSIS:   Past Medical History:  Diagnosis Date  . Anemia   . Anxiety   . Arthritis    osteo, back  . Depression   . Diverticulosis   . GERD (gastroesophageal reflux disease)   . Hematochezia   . Hypertension    controlled on meds  . Hypothyroidism     HOSPITAL COURSE:   1. Chest pain: Atypical in duration although radiation to neck and arm are features typical of anginal pain. EKG changes in ongoing chest pain warrant systemic anticoagulation. Started on heparin drip, but later as troponin negative- d/c heparine and  Seen by cardiologist, suggested Out pt stress test. Pt still have some heaviness, found to have finding concerning of Pneumonia on Xray. 2. Pneumonia: Atypical. The patient has received azithromycin in the emergency department.   Will give rocephin+ azithromycin. Finish 3 more days oral Abx at home. 3. Hypertension: Controlled;Continue bisoprolol, hydrochlorothiazide and valsartan. Labetalol as needed. 4. Hypothyroidism: Continue Synthroid; checked TSH 5. Alcohol abuse: Initiate CIWA scale. Ativan as needed.   As per son she does not drink much. No withdrawal signs here. 6. DVT prophylaxis: As above 7. GI prophylaxis: PPI per home regimen  DISCHARGE CONDITIONS:   Stable.  CONSULTS OBTAINED:  Treatment Team:  Minus Breeding, MD  DRUG ALLERGIES:   Allergies  Allergen Reactions  . Aspirin Other (See Comments)    GI BLEED  . Nsaids Other (See Comments)    GI BLEED  . Penicillins  Itching, Rash and Other (See Comments)    Has patient had a PCN reaction causing immediate rash, facial/tongue/throat swelling, SOB or lightheadedness with hypotension: Yes Has patient had a PCN reaction causing severe rash involving mucus membranes or skin necrosis: No Has patient had a PCN reaction that required hospitalization No Has patient had a PCN reaction occurring within the last 10 years: No If all of the above answers are "NO", then may proceed with Cephalosporin use.     DISCHARGE MEDICATIONS:   Current Discharge Medication List    START taking these medications   Details  azithromycin (ZITHROMAX) 250 MG tablet Take 1 tablet (250 mg total) by mouth daily. Qty: 3 each, Refills: 0    cefUROXime (CEFTIN) 250 MG tablet Take 1 tablet (250 mg total) by mouth 2 (two) times daily with a meal. Qty: 6 tablet, Refills: 0    folic acid (FOLVITE) 1 MG tablet Take 1 tablet (1 mg total) by mouth daily. Qty: 30 tablet, Refills: 0    thiamine 100 MG tablet Take 1 tablet (100 mg total) by mouth daily. Qty: 30 tablet, Refills: 0      CONTINUE these medications which have NOT CHANGED   Details  bisoprolol-hydrochlorothiazide (ZIAC) 10-6.25 MG tablet TAKE ONE (1) TABLET BY MOUTH EVERY DAY Qty: 90 tablet, Refills: 3    diclofenac sodium (VOLTAREN) 1 % GEL Apply 4 g topically 4 (four) times daily. Qty: 1 Tube, Refills: 3   Associated Diagnoses: Right knee pain    DULoxetine (CYMBALTA) 20 MG capsule Take  20 mg by mouth once a day for one week. Then increase to 20 mg twice a day and stay at that dose thereafter. Qty: 60 capsule, Refills: 2   Associated Diagnoses: Anxiety    erythromycin ophthalmic ointment Place 1 application into the left eye at bedtime. Qty: 3.5 g, Refills: 0    levothyroxine (SYNTHROID, LEVOTHROID) 50 MCG tablet TAKE ONE (1) TABLET BY MOUTH EVERY DAY Qty: 90 tablet, Refills: 3    losartan (COZAAR) 100 MG tablet TAKE ONE (1) TABLET BY MOUTH EVERY DAY Qty: 90  tablet, Refills: 3   Associated Diagnoses: Essential hypertension    Multiple Vitamins-Minerals (MULTI COMPLETE PO) Take 1 tablet by mouth. am    pantoprazole (PROTONIX) 40 MG tablet TAKE ONE (1) TABLET EACH DAY Qty: 90 tablet, Refills: 3    valACYclovir (VALTREX) 1000 MG tablet Take 1 tablet (1,000 mg total) by mouth 3 (three) times daily. For 7 days. Qty: 21 tablet, Refills: 0         DISCHARGE INSTRUCTIONS:    Follow with cardiologist in 1 week  If you experience worsening of your admission symptoms, develop shortness of breath, life threatening emergency, suicidal or homicidal thoughts you must seek medical attention immediately by calling 911 or calling your MD immediately  if symptoms less severe.  You Must read complete instructions/literature along with all the possible adverse reactions/side effects for all the Medicines you take and that have been prescribed to you. Take any new Medicines after you have completely understood and accept all the possible adverse reactions/side effects.   Please note  You were cared for by a hospitalist during your hospital stay. If you have any questions about your discharge medications or the care you received while you were in the hospital after you are discharged, you can call the unit and asked to speak with the hospitalist on call if the hospitalist that took care of you is not available. Once you are discharged, your primary care physician will handle any further medical issues. Please note that NO REFILLS for any discharge medications will be authorized once you are discharged, as it is imperative that you return to your primary care physician (or establish a relationship with a primary care physician if you do not have one) for your aftercare needs so that they can reassess your need for medications and monitor your lab values.    Today   CHIEF COMPLAINT:   Chief Complaint  Patient presents with  . Chest Pain    HISTORY OF  PRESENT ILLNESS:  Lindsay [name]  is a 76 y.o. female with a known history of hypertension and hypothyroidism presents to emergency department complaining of chest pain. It began at rest approximately 4 hours prior to admission. She states that the pain is sharp in character and radiates to her left shoulder and into her jaw. She admits to occasional shortness of breath due to respirations being limited by pain on her right side. She admits to nausea and diaphoresis but only after taking nitroglycerin in the emergency department. She did not receive aspirin due to reported allergy. The patient continued to have chest pain is by negative cardiac enzymes. However the patient has lateral EKG changes not present on EKG from 8 years ago. Due to her ongoing pain and EKG changes the emergency department staff initiated heparin therapy. Thus the hospitalist service was called for further management.   VITAL SIGNS:  Blood pressure (!) 144/74, pulse 77, temperature 98 F (36.7 C), temperature source  Oral, resp. rate 16, height 5\' 6"  (1.676 m), weight 70.4 kg (155 lb 4.8 oz), SpO2 92 %.  I/O:   Intake/Output Summary (Last 24 hours) at 02/10/17 1004 Last data filed at 02/10/17 0134  Gross per 24 hour  Intake                0 ml  Output              400 ml  Net             -400 ml    PHYSICAL EXAMINATION:   GENERAL:  76 y.o.-year-old patient lying in the bed with no acute distress.  EYES: Pupils equal, round, reactive to light and accommodation. No scleral icterus. Extraocular muscles intact.  HEENT: Head atraumatic, normocephalic. Oropharynx and nasopharynx clear.  NECK:  Supple, no jugular venous distention. No thyroid enlargement, no tenderness.  LUNGS: Normal breath sounds bilaterally, no wheezing, rales,rhonchi or crepitation. No use of accessory muscles of respiration.  CARDIOVASCULAR: S1, S2 normal. No murmurs, rubs, or gallops.  ABDOMEN: Soft, nontender, nondistended. Bowel sounds present. No  organomegaly or mass.  EXTREMITIES: No pedal edema, cyanosis, or clubbing.  NEUROLOGIC: Cranial nerves II through XII are intact. Muscle strength 5/5 in all extremities. Sensation intact. Gait not checked.  PSYCHIATRIC: The patient is alert and oriented x 3.  SKIN: No obvious rash, lesion, or ulcer.   DATA REVIEW:   CBC  Recent Labs Lab 02/09/17 0059  WBC 12.0*  HGB 14.2  HCT 41.1  PLT 197    Chemistries   Recent Labs Lab 02/09/17 0059  NA 142  K 3.1*  CL 104  CO2 27  GLUCOSE 159*  BUN 9  CREATININE 0.33*  CALCIUM 8.8*  AST 21  ALT 17  ALKPHOS 48  BILITOT 0.6    Cardiac Enzymes  Recent Labs Lab 02/09/17 1601  TROPONINI <0.03    Microbiology Results  Results for orders placed or performed during the hospital encounter of 02/09/17  Blood culture (routine x 2)     Status: None (Preliminary result)   Collection Time: 02/09/17  2:39 AM  Result Value Ref Range Status   Specimen Description BLOOD LEFT ANTECUBITAL  Final   Special Requests BOTTLES DRAWN AEROBIC AND ANAEROBIC BCAV  Final   Culture NO GROWTH 1 DAY  Final   Report Status PENDING  Incomplete  Blood culture (routine x 2)     Status: None (Preliminary result)   Collection Time: 02/09/17  2:39 AM  Result Value Ref Range Status   Specimen Description BLOOD RIGHT ANTECUBITAL  Final   Special Requests BOTTLES DRAWN AEROBIC AND ANAEROBIC BCAV  Final   Culture NO GROWTH 1 DAY  Final   Report Status PENDING  Incomplete    RADIOLOGY:  Dg Chest 2 View  Result Date: 02/09/2017 CLINICAL DATA:  Central chest pain for several hours tonight EXAM: CHEST  2 VIEW COMPARISON:  11/06/2006 FINDINGS: Streaky opacities and interstitial thickening in the lung bases bilaterally, without confluent airspace consolidation. No pleural effusions. Hilar and mediastinal contours are unremarkable. IMPRESSION: Basilar interstitial thickening and streaky lung base opacities. This could represent atypical pneumonia. No  consolidation or effusion. Electronically Signed   By: Andreas Newport M.D.   On: 02/09/2017 01:24    EKG:   Orders placed or performed during the hospital encounter of 02/09/17  . EKG 12-Lead  . EKG 12-Lead  . ED EKG within 10 minutes  . ED EKG within 10 minutes  Management plans discussed with the patient, family and they are in agreement.  CODE STATUS:     Code Status Orders        Start     Ordered   02/09/17 0413  Full code  Continuous     02/09/17 0412    Code Status History    Date Active Date Inactive Code Status Order ID Comments User Context   06/25/2015  1:30 PM 06/26/2015 12:20 PM Full Code [id number]  Vaughan Basta, MD Inpatient      TOTAL TIME TAKING CARE OF THIS PATIENT: 35 minutes.    Vaughan Basta M.D on 02/10/2017 at 10:04 AM  Between 7am to 6pm - Pager - [phone]  After 6pm go to [email or web address] - password [email or web address] [facility]  Office  [phone]  CC: Primary care physician; Mable Paris, FNP   Note: This dictation was prepared with Dragon dictation along with smaller phrase technology. Any transcriptional errors that result from this process are unintentional.

## 2017-02-10 NOTE — Discharge Instructions (Signed)

## 2017-02-11 ENCOUNTER — Telehealth: Payer: Self-pay | Admitting: Family

## 2017-02-11 NOTE — Telephone Encounter (Signed)
Transition Care Management Follow-up Telephone Call"  How have you been since you were released from the hospital? I have been feeling pretty good"   Do you understand why you were in the hospital?Yes, I was having chest pain between my breast.   Do you understand the discharge instrcutions? Yes  Items Reviewed:  Medications reviewed:Yes, patient discharged 02/10/17 picked up Ceftin, Zithromax< Folic acid, and Thiamine for pharmacy on 02/11/17.  Allergies reviewed: yes  Dietary changes reviewed: No  Referrals reviewed:Yes, needs to follow up with cardiology has not been scheduled. Patient stated she would schedule.   Functional Questionnaire:   Activities of Daily Living (ADLs):   She states they are independent in the following: Patient is independent in ADL's States they require assistance with the following: requires no assist.   Any transportation issues/concerns?:No   Any patient concerns? Patient would like to know how she can have pneumonia and not be coughing.   Confirmed importance and date/time of follow-up visits scheduled:Yes   Confirmed with patient if condition begins to worsen call PCP or go to the ER.  Patient was given the Call-a-Nurse line [phone]: Yes

## 2017-02-12 ENCOUNTER — Telehealth: Payer: Self-pay | Admitting: *Deleted

## 2017-02-12 DIAGNOSIS — R079 Chest pain, unspecified: Secondary | ICD-10-CM

## 2017-02-12 DIAGNOSIS — I10 Essential (primary) hypertension: Secondary | ICD-10-CM

## 2017-02-12 NOTE — Telephone Encounter (Signed)
S/w patient regarding scheduling follow up appt with Dr End, echo, and [place] appointment. Explained echo and Lexiscan procedures. Patient agreed to echo; however, did not want to have [place]. She stated she knew other people who had this test and she didn't want to. I offered to have Dr End call her to discuss lexiscan myoview and stress testing in detail. She was agreeable to discuss with Dr End. In the meantime, if scheduling calls her she will go ahead and schedule appt and echo.  Routing to Dr End.

## 2017-02-12 NOTE — Progress Notes (Signed)
Subjective:    Patient ID: Lindsay [name], female    DOB: 1941-07-28, 77 y.o.   MRN: [id number]  CC: Lindsay [name] is a 76 y.o. female who presents today for follow up.   HPI: Patient here for transitional care appointment.   She was contacted 3/12 by a nurse in our office. She was admitted to [facility] 3/10 in discharge 3/11 for unstable angina and atypical pneumonia. She presented to emergency room with atypical chest pain. Lateral changes seen on EKG. Troponins negative. She is pending an outpatient stress test. She was given azithromycin and Rocephin and told to complete 3 more days of oral antibiotics at home. Chest x-ray showed basilar interstitial thickening and streaking , representing an atypical pneumonia. No effusion.  PNA, atypical- completed ceftin and azithroymin. No fever.    Hypertension- on 100 losartan. Denies exertional chest pain or pressure, numbness or tingling radiating to left arm or jaw, palpitations, dizziness, frequent headaches, changes in vision, or shortness of breath.   Hypothyroidism- No symptoms.   Alcohol abuse- day of hospitalization had 2 liquor drinks. Started thiamine and folate at hospital. Still doesn't know why they gave her ativan and quite upset about it. Doesn't get drunk. She only drinks on weekends        Cardiology consulted patient in the hospital. Recommended outpatient echo and stress test.  HISTORY:  Past Medical History:  Diagnosis Date  . Anemia   . Anxiety   . Arthritis    osteo, back  . Depression   . Diverticulosis   . GERD (gastroesophageal reflux disease)   . Hematochezia   . Hypertension    controlled on meds  . Hypothyroidism    Past Surgical History:  Procedure Laterality Date  . ABDOMINAL HYSTERECTOMY    . CARDIAC CATHETERIZATION  1995   normal, Dr Clayborn Bigness  . COLONOSCOPY  12/29/08  . COLONOSCOPY N/A 04/18/2015   Procedure: COLONOSCOPY;  Surgeon: Lucilla Lame, MD;  Location: [facility];   Service: Gastroenterology;  Laterality: N/A;  cecum time- 0957  . FOOT SURGERY    . KNEE ARTHROSCOPY Right 10/05/2015   Procedure: ARTHROSCOPY KNEE, partial lateral menisectomy;  Surgeon: Earnestine Leys, MD;  Location: [facility];  Service: Orthopedics;  Laterality: Right;  . POLYPECTOMY  04/18/2015   Procedure: POLYPECTOMY INTESTINAL;  Surgeon: Lucilla Lame, MD;  Location: [facility];  Service: Gastroenterology;;  . WRIST SURGERY     Family History  Problem Relation Age of Onset  . Diabetes Mother   . Hypertension Mother   . Diabetes Sister   . Hypertension Sister     Allergies: Aspirin; Nsaids; and Penicillins Current Outpatient Prescriptions on File Prior to Visit  Medication Sig Dispense Refill  . bisoprolol-hydrochlorothiazide (ZIAC) 10-6.25 MG tablet TAKE ONE (1) TABLET BY MOUTH EVERY DAY 90 tablet 3  . diclofenac sodium (VOLTAREN) 1 % GEL Apply 4 g topically 4 (four) times daily. 1 Tube 3  . DULoxetine (CYMBALTA) 20 MG capsule Take 20 mg by mouth once a day for one week. Then increase to 20 mg twice a day and stay at that dose thereafter. 60 capsule 2  . folic acid (FOLVITE) 1 MG tablet Take 1 tablet (1 mg total) by mouth daily. 30 tablet 0  . levothyroxine (SYNTHROID, LEVOTHROID) 50 MCG tablet TAKE ONE (1) TABLET BY MOUTH EVERY DAY 90 tablet 3  . losartan (COZAAR) 100 MG tablet TAKE ONE (1) TABLET BY MOUTH EVERY DAY 90 tablet 3  . Multiple  Vitamins-Minerals (MULTI COMPLETE PO) Take 1 tablet by mouth. am    . pantoprazole (PROTONIX) 40 MG tablet TAKE ONE (1) TABLET EACH DAY 90 tablet 3  . thiamine 100 MG tablet Take 1 tablet (100 mg total) by mouth daily. 30 tablet 0  . valACYclovir (VALTREX) 1000 MG tablet Take 1 tablet (1,000 mg total) by mouth 3 (three) times daily. For 7 days. 21 tablet 0   No current facility-administered medications on file prior to visit.     Social History  Substance Use Topics  . Smoking status: Current Every Day Smoker    Packs/day: 0.50     Years: 30.00    Types: Cigarettes  . Smokeless tobacco: Never Used  . Alcohol use 0.0 oz/week     Comment: occassionally/ whiskey on weekend; total of 4 drinks per week    Review of Systems  Constitutional: Negative for chills and fever.  Respiratory: Negative for cough, shortness of breath and wheezing.   Cardiovascular: Negative for chest pain, palpitations and leg swelling.  Gastrointestinal: Negative for nausea and vomiting.      Objective:    BP (!) 150/88 (BP Location: Right Arm, Patient Position: Sitting, Cuff Size: Normal)   Pulse 63   Temp 98.6 F (37 C) (Oral)   Resp 18   Wt 156 lb (70.8 kg)   SpO2 94%   BMI 25.18 kg/m  BP Readings from Last 3 Encounters:  02/14/17 (!) 150/88  02/10/17 (!) 144/74  12/24/16 (!) 160/78   Wt Readings from Last 3 Encounters:  02/14/17 156 lb (70.8 kg)  02/10/17 155 lb 4.8 oz (70.4 kg)  12/24/16 156 lb 12.8 oz (71.1 kg)    Physical Exam  Constitutional: She appears well-developed and well-nourished.  Eyes: Conjunctivae are normal.  Cardiovascular: Normal rate, regular rhythm, normal heart sounds and normal pulses.   Pulmonary/Chest: Effort normal and breath sounds normal. She has no wheezes. She has no rhonchi. She has no rales.  Neurological: She is alert.  Skin: Skin is warm and dry.  Psychiatric: She has a normal mood and affect. Her speech is normal and behavior is normal. Thought content normal.  Vitals reviewed.      Assessment & Plan:   Problem List Items Addressed This Visit      Endocrine   Hypothyroidism (Chronic)    Low in hospital. Pending tsh      Relevant Orders   TSH   B12 and Folate Panel     Other   Alcohol use    Had a  long discussion with patient about her use of alcohol. From my perspective, she does not seen to abuse alcohol. On the weekend she will have 2 liquor drinks each night. No one in her family has thought she had a problem.  She doesn't use an eye-opener ever and no has found her  drinking to be annoying. Her ethanol level, as I showed her, was elevated and she was in the emergency room which is why she was put on CIWA protocol. I reassured patient that I believe and understood everything she said. I will redraw B12 and folate labs and see if actually need to be supplemented. Otherwise I counseled her always be transparent  with me and that I trust her.       Chest pain - Primary    No chest pain today. No cough or fever and has completed antibiotics. Explained to patient and there were some EKG changes seen in while she is  in the hospital when compared to prior EKGs. She prefers to see Dr Laruth Bouchard placed as  likely she will need a stress test and echo. Meds reconciled as well.       Relevant Orders   Ambulatory referral to Cardiology   DG Chest 2 View       I am having Lindsay [name] maintain her Multiple Vitamins-Minerals (MULTI COMPLETE PO), valACYclovir, bisoprolol-hydrochlorothiazide, levothyroxine, pantoprazole, losartan, diclofenac sodium, DULoxetine, folic acid, and thiamine.   No orders of the defined types were placed in this encounter.   Return precautions given.   Risks, benefits, and alternatives of the medications and treatment plan prescribed today were discussed, and patient expressed understanding.   Education regarding symptom management and diagnosis given to patient on AVS.  Continue to follow with Mable Paris, FNP for routine health maintenance.   Lindsay [name] and I agreed with plan.   Mable Paris, FNP

## 2017-02-12 NOTE — Telephone Encounter (Signed)
Patient seen in hospital by Dr Percival Spanish on 02/09/17 while in the hospital.  Dr Percival Spanish recommended: "ASSESSMENT AND PLAN:   CHEST PAIN:    Her chest pain has atypical greater than typical features.  She has had no enzyme elevation.  Her HEART score is 4.  She needs stress testing for screening but this could be done as an out patient.  Her family would prefer this.  Repeat an EKG in the AM.   HTN:  Continue current therapy.  She needs BP diary to follow up at home as she likely needs adjustment.  Check an echo as an out patient to evaluate for possible LVH. "  Discussed with Dr End who outpatient appointment will be made with. He confirmed ok to order stress test and echo for patient outpatient then follow up with him in clinic.  Message sent to scheduling to schedule appointments.

## 2017-02-12 NOTE — Telephone Encounter (Signed)
Confirmed with Dr End to order [facility] for patient.   Order entered for [employer].

## 2017-02-13 NOTE — Telephone Encounter (Signed)
I was unable to reach the patient by phone at both numbers listed. I left a message for her to call the [facility] office so that I can answer her questions regarding the stress test.  Nelva Bush, MD [place] Pager: [phone]

## 2017-02-14 ENCOUNTER — Ambulatory Visit (INDEPENDENT_AMBULATORY_CARE_PROVIDER_SITE_OTHER): Payer: Medicare HMO | Admitting: Family

## 2017-02-14 ENCOUNTER — Encounter: Payer: Self-pay | Admitting: Family

## 2017-02-14 VITALS — BP 150/88 | HR 63 | Temp 98.6°F | Resp 18 | Wt 156.0 lb

## 2017-02-14 DIAGNOSIS — E039 Hypothyroidism, unspecified: Secondary | ICD-10-CM | POA: Diagnosis not present

## 2017-02-14 DIAGNOSIS — R079 Chest pain, unspecified: Secondary | ICD-10-CM | POA: Diagnosis not present

## 2017-02-14 DIAGNOSIS — Z7289 Other problems related to lifestyle: Secondary | ICD-10-CM | POA: Insufficient documentation

## 2017-02-14 DIAGNOSIS — Z789 Other specified health status: Secondary | ICD-10-CM | POA: Diagnosis not present

## 2017-02-14 DIAGNOSIS — J189 Pneumonia, unspecified organism: Secondary | ICD-10-CM | POA: Diagnosis not present

## 2017-02-14 DIAGNOSIS — F1090 Alcohol use, unspecified, uncomplicated: Secondary | ICD-10-CM

## 2017-02-14 LAB — CULTURE, BLOOD (ROUTINE X 2)
Culture: NO GROWTH
Culture: NO GROWTH

## 2017-02-14 LAB — TSH: TSH: 0.48 u[IU]/mL (ref 0.35–4.50)

## 2017-02-14 LAB — B12 AND FOLATE PANEL
FOLATE: 19.8 ng/mL (ref >5.9)
VITAMIN B 12: 815 pg/mL (ref 211–911)

## 2017-02-14 NOTE — Patient Instructions (Signed)
Labs today.     Referral to cardiology

## 2017-02-14 NOTE — Progress Notes (Signed)
Pre visit review using our clinic review tool, if applicable. No additional management support is needed unless otherwise documented below in the visit note. 

## 2017-02-14 NOTE — Telephone Encounter (Signed)
No answer on both phone numbers listed for patient. Left message to call back.

## 2017-02-14 NOTE — Assessment & Plan Note (Signed)
Low in hospital. Pending tsh

## 2017-02-14 NOTE — Assessment & Plan Note (Addendum)
No chest pain today. No cough or fever and has completed antibiotics. Explained to patient and there were some EKG changes seen in while she is in the hospital when compared to prior EKGs. She prefers to see Dr Laruth Bouchard placed as  likely she will need a stress test and echo. Meds reconciled as well.

## 2017-02-14 NOTE — Assessment & Plan Note (Signed)
Had a  long discussion with patient about her use of alcohol. From my perspective, she does not seen to abuse alcohol. On the weekend she will have 2 liquor drinks each night. No one in her family has thought she had a problem.  She doesn't use an eye-opener ever and no has found her drinking to be annoying. Her ethanol level, as I showed her, was elevated and she was in the emergency room which is why she was put on CIWA protocol. I reassured patient that I believe and understood everything she said. I will redraw B12 and folate labs and see if actually need to be supplemented. Otherwise I counseled her always be transparent  with me and that I trust her.

## 2017-02-18 NOTE — Telephone Encounter (Signed)
Nelva Bush, MD  Vanessa Ralphs, RN        Since she has questions/reservations about the stress test, I think it would be best to proceed with echo and keep her f/u with me the next day. That way we can discuss the stress test in person and make sure that she is comfortable proceeding. Thanks.    Will wait to schedule stress test at this time and keep echo and follow up appts as advised by Dr End above.

## 2017-02-26 ENCOUNTER — Other Ambulatory Visit: Payer: Medicare HMO

## 2017-02-27 ENCOUNTER — Ambulatory Visit: Payer: Medicare HMO | Admitting: Internal Medicine

## 2017-03-06 ENCOUNTER — Telehealth: Payer: Self-pay | Admitting: Family

## 2017-03-06 NOTE — Telephone Encounter (Signed)
Letter has been mailed.

## 2017-03-06 NOTE — Telephone Encounter (Signed)
Mail to pt  Ms Lindsay, [name] you are well.   Wanted to remind you to come back in 4 weeks for repeat chest xray to ensure no pneumonia is left.   You may make an appointment anytime for radiology appointment .   Take care,   Joycelyn Schmid

## 2017-03-11 ENCOUNTER — Telehealth: Payer: Self-pay | Admitting: *Deleted

## 2017-03-11 NOTE — Telephone Encounter (Signed)
On 02/21/17 at 12:05 pm, Patient called and cancelled echo appt and appt with Dr End. Cancelled due to the following reason as noted in the appt tab for the patient in EPIC: Patient (pt calling stating does not want to see [place] at this time/states pcp made these for her.  Today, called and left message with patient to call us to schedule when she is ready as it was recommended by the cardiologist in the hospital for her to have outpatient cardiac testing and evaluation.

## 2017-07-16 ENCOUNTER — Ambulatory Visit: Payer: Medicare HMO | Admitting: Family

## 2017-07-17 ENCOUNTER — Encounter: Payer: Self-pay | Admitting: Family

## 2017-07-17 ENCOUNTER — Ambulatory Visit (INDEPENDENT_AMBULATORY_CARE_PROVIDER_SITE_OTHER): Payer: Medicare HMO | Admitting: Family

## 2017-07-17 VITALS — BP 102/62 | HR 68 | Temp 98.2°F | Ht 66.0 in | Wt 158.0 lb

## 2017-07-17 DIAGNOSIS — G8929 Other chronic pain: Secondary | ICD-10-CM

## 2017-07-17 DIAGNOSIS — I10 Essential (primary) hypertension: Secondary | ICD-10-CM | POA: Diagnosis not present

## 2017-07-17 DIAGNOSIS — E039 Hypothyroidism, unspecified: Secondary | ICD-10-CM | POA: Diagnosis not present

## 2017-07-17 DIAGNOSIS — Z760 Encounter for issue of repeat prescription: Secondary | ICD-10-CM | POA: Diagnosis not present

## 2017-07-17 DIAGNOSIS — M25561 Pain in right knee: Secondary | ICD-10-CM | POA: Diagnosis not present

## 2017-07-17 DIAGNOSIS — R079 Chest pain, unspecified: Secondary | ICD-10-CM

## 2017-07-17 LAB — BASIC METABOLIC PANEL
BUN: 13 mg/dL (ref 6–23)
CHLORIDE: 103 meq/L (ref 96–112)
CO2: 31 mEq/L (ref 19–32)
CREATININE: 0.76 mg/dL (ref 0.40–1.20)
Calcium: 9.5 mg/dL (ref 8.4–10.5)
GFR: 95.61 mL/min (ref >60.00)
Glucose, Bld: 97 mg/dL (ref 70–99)
Potassium: 3.9 mEq/L (ref 3.5–5.1)
Sodium: 140 mEq/L (ref 135–145)

## 2017-07-17 LAB — TSH: TSH: 0.48 u[IU]/mL (ref 0.35–4.50)

## 2017-07-17 MED ORDER — LEVOTHYROXINE SODIUM 50 MCG PO TABS: ORAL_TABLET | ORAL | 3 refills | Status: DC

## 2017-07-17 MED ORDER — LOSARTAN POTASSIUM 100 MG PO TABS: ORAL_TABLET | ORAL | 3 refills | Status: AC

## 2017-07-17 MED ORDER — BISOPROLOL-HYDROCHLOROTHIAZIDE 10-6.25 MG PO TABS: ORAL_TABLET | ORAL | 3 refills | Status: DC

## 2017-07-17 MED ORDER — PANTOPRAZOLE SODIUM 40 MG PO TBEC: DELAYED_RELEASE_TABLET | ORAL | 3 refills | Status: AC

## 2017-07-17 NOTE — Patient Instructions (Addendum)
Titrate off cymbalta- start taking 20mg  every other day for one week, then take every 3rd day for one week; you may stop at this point.  Labs today  Spot check blood pressure, goal is less than 130/80.    Please follow up with Dr Sabra Heck for knee pain  Follow up 3 months

## 2017-07-17 NOTE — Progress Notes (Signed)
Subjective:    Patient ID: Lindsay [name], female    DOB: 1941-10-09, 76 y.o.   MRN: [id number]  CC: Lindsay [name] is a 76 y.o. female who presents today for follow up.   HPI: HTN- compliant with medications.Denies exertional chest pain or pressure, numbness or tingling radiating to left arm or jaw, palpitations, dizziness, frequent headaches, changes in vision, or shortness of breath.   Hypothyroidism- compliant with medication. Denies cold/heat intolerance, changes in weight.  No episodes of chest pain since hospitalization. Has not followed with [facility]  Right knee pain- chronic, unchanged. had laproscopic repair 2016 and thinks made it worse. Injections with Dr Elisabeth Most really helped. Cymbalta is too expensive, only taking one tablet per day.        HISTORY:  Past Medical History:  Diagnosis Date  . Anemia   . Anxiety   . Arthritis    osteo, back  . Depression   . Diverticulosis   . GERD (gastroesophageal reflux disease)   . Hematochezia   . Hypertension    controlled on meds  . Hypothyroidism    Past Surgical History:  Procedure Laterality Date  . ABDOMINAL HYSTERECTOMY    . CARDIAC CATHETERIZATION  1995   normal, Dr Clayborn Bigness  . COLONOSCOPY  12/29/08  . COLONOSCOPY N/A 04/18/2015   Procedure: COLONOSCOPY;  Surgeon: Lucilla Lame, MD;  Location: [place];  Service: Gastroenterology;  Laterality: N/A;  cecum time- 0957  . FOOT SURGERY    . KNEE ARTHROSCOPY Right 10/05/2015   Procedure: ARTHROSCOPY KNEE, partial lateral menisectomy;  Surgeon: Earnestine Leys, MD;  Location: [facility];  Service: Orthopedics;  Laterality: Right;  . POLYPECTOMY  04/18/2015   Procedure: POLYPECTOMY INTESTINAL;  Surgeon: Lucilla Lame, MD;  Location: [place];  Service: Gastroenterology;;  . WRIST SURGERY     Family History  Problem Relation Age of Onset  . Diabetes Mother   . Hypertension Mother   . Diabetes Sister   . Hypertension Sister     Allergies:  Aspirin; Nsaids; Pollen extract; and Penicillins Current Outpatient Prescriptions on File Prior to Visit  Medication Sig Dispense Refill  . Multiple Vitamins-Minerals (MULTI COMPLETE PO) Take 1 tablet by mouth. am    . valACYclovir (VALTREX) 1000 MG tablet Take 1 tablet (1,000 mg total) by mouth 3 (three) times daily. For 7 days. 21 tablet 0   No current facility-administered medications on file prior to visit.     Social History  Substance Use Topics  . Smoking status: Current Every Day Smoker    Packs/day: 0.50    Years: 30.00    Types: Cigarettes  . Smokeless tobacco: Never Used  . Alcohol use 0.0 oz/week     Comment: occassionally/ whiskey on weekend; total of 4 drinks per week    Review of Systems  Constitutional: Negative for chills and fever.  Respiratory: Negative for cough.   Cardiovascular: Negative for chest pain and palpitations.  Gastrointestinal: Negative for nausea and vomiting.      Objective:    BP 102/62   Pulse 68   Temp 98.2 F (36.8 C) (Oral)   Ht 5\' 6"  (1.676 m)   Wt 158 lb (71.7 kg)   SpO2 97%   BMI 25.50 kg/m  BP Readings from Last 3 Encounters:  07/17/17 102/62  02/14/17 (!) 150/88  02/10/17 (!) 144/74   Wt Readings from Last 3 Encounters:  07/17/17 158 lb (71.7 kg)  02/14/17 156 lb (70.8 kg)  02/10/17 155  lb 4.8 oz (70.4 kg)    Physical Exam  Constitutional: She appears well-developed and well-nourished.  Eyes: Conjunctivae are normal.  Cardiovascular: Normal rate, regular rhythm, normal heart sounds and normal pulses.   Pulmonary/Chest: Effort normal and breath sounds normal. She has no wheezes. She has no rhonchi. She has no rales.  Neurological: She is alert.  Skin: Skin is warm and dry.  Psychiatric: She has a normal mood and affect. Her speech is normal and behavior is normal. Thought content normal.  Vitals reviewed.      Assessment & Plan:   Problem List Items Addressed This Visit      Cardiovascular and Mediastinum    Hypertension - Primary (Chronic)    Well controlled. Will continue current regimen      Relevant Medications   losartan (COZAAR) 100 MG tablet   bisoprolol-hydrochlorothiazide (ZIAC) 10-6.25 MG tablet   Other Relevant Orders   Basic metabolic panel (Completed)     Endocrine   Hypothyroidism (Chronic)   Relevant Medications   levothyroxine (SYNTHROID, LEVOTHROID) 50 MCG tablet   Other Relevant Orders   TSH (Completed)     Other   Right knee pain    Chronic. Advised follow up with Sabra Heck as likely would need TKR. Discontinued cymbalta since expensive for patient.       Chest pain    No further episodes. Again, advised patient if recurred to call 911 or go to ED. Also advised that follow up with [facility] would be appropriate and likely she could benefit from stress test. Patient states she will contact him if she has any symptoms.        Other Visit Diagnoses    Medication refill       Relevant Medications   losartan (COZAAR) 100 MG tablet   bisoprolol-hydrochlorothiazide (ZIAC) 10-6.25 MG tablet   levothyroxine (SYNTHROID, LEVOTHROID) 50 MCG tablet   pantoprazole (PROTONIX) 40 MG tablet       I have discontinued [name] diclofenac sodium, DULoxetine, folic acid, and thiamine. I am also having her maintain her Multiple Vitamins-Minerals (MULTI COMPLETE PO), valACYclovir, losartan, bisoprolol-hydrochlorothiazide, levothyroxine, and pantoprazole.   Meds ordered this encounter  Medications  . losartan (COZAAR) 100 MG tablet    Sig: TAKE ONE (1) TABLET BY MOUTH EVERY DAY    Dispense:  90 tablet    Refill:  3    Order Specific Question:   Supervising Provider    Answer:   Deborra Medina L [[id number]]  . bisoprolol-hydrochlorothiazide (ZIAC) 10-6.25 MG tablet    Sig: TAKE ONE (1) TABLET BY MOUTH EVERY DAY    Dispense:  90 tablet    Refill:  3    Order Specific Question:   Supervising Provider    Answer:   Deborra Medina L [[id number]]  . levothyroxine (SYNTHROID, LEVOTHROID) 50  MCG tablet    Sig: TAKE ONE (1) TABLET BY MOUTH EVERY DAY    Dispense:  90 tablet    Refill:  3    Order Specific Question:   Supervising Provider    Answer:   Deborra Medina L [[id number]]  . pantoprazole (PROTONIX) 40 MG tablet    Sig: TAKE ONE (1) TABLET EACH DAY    Dispense:  90 tablet    Refill:  3    Order Specific Question:   Supervising Provider    Answer:   Crecencio Mc [[id number]]    Return precautions given.   Risks, benefits, and alternatives of the medications and treatment plan prescribed  today were discussed, and patient expressed understanding.   Education regarding symptom management and diagnosis given to patient on AVS.  Continue to follow with Burnard Hawthorne, FNP for routine health maintenance.   [name] and I agreed with plan.   Mable Paris, FNP

## 2017-07-17 NOTE — Progress Notes (Signed)
Pre visit review using our clinic review tool, if applicable. No additional management support is needed unless otherwise documented below in the visit note. 

## 2017-07-18 NOTE — Assessment & Plan Note (Signed)
Well controlled. Will continue current regimen.

## 2017-07-18 NOTE — Assessment & Plan Note (Signed)
No further episodes. Again, advised patient if recurred to call 911 or go to ED. Also advised that follow up with [facility] would be appropriate and likely she could benefit from stress test. Patient states she will contact him if she has any symptoms.

## 2017-07-18 NOTE — Assessment & Plan Note (Signed)
Chronic. Advised follow up with Sabra Heck as likely would need TKR. Discontinued cymbalta since expensive for patient.

## 2017-07-26 NOTE — Telephone Encounter (Signed)
Error

## 2017-08-22 DIAGNOSIS — L239 Allergic contact dermatitis, unspecified cause: Secondary | ICD-10-CM | POA: Diagnosis not present

## 2017-08-22 DIAGNOSIS — R21 Rash and other nonspecific skin eruption: Secondary | ICD-10-CM | POA: Diagnosis not present

## 2017-09-09 ENCOUNTER — Encounter: Payer: Self-pay | Admitting: Family

## 2017-09-09 ENCOUNTER — Ambulatory Visit (INDEPENDENT_AMBULATORY_CARE_PROVIDER_SITE_OTHER): Payer: Medicare HMO | Admitting: Family

## 2017-09-09 VITALS — BP 112/82 | HR 67 | Temp 98.3°F | Ht 66.0 in | Wt 161.6 lb

## 2017-09-09 DIAGNOSIS — B009 Herpesviral infection, unspecified: Secondary | ICD-10-CM | POA: Diagnosis not present

## 2017-09-09 DIAGNOSIS — B029 Zoster without complications: Secondary | ICD-10-CM

## 2017-09-09 MED ORDER — VALACYCLOVIR HCL 500 MG PO TABS: 500.0000 mg | ORAL_TABLET | Freq: Two times a day (BID) | ORAL | 1 refills | Status: AC

## 2017-09-09 NOTE — Patient Instructions (Signed)
Suspect herpetic lesions  Valtrex re ordered  If there is no improvement in your symptoms, or if there is any worsening of symptoms, or if you have any additional concerns, please return for re-evaluation; or, if we are closed, consider going to the Emergency Room for evaluation if symptoms urgent.

## 2017-09-09 NOTE — Assessment & Plan Note (Signed)
Patient is most consistent with herpetic lesions. Will treat empirically today. Advised patient recurrence of symptom to take valtrex. Return precautions given.

## 2017-09-09 NOTE — Progress Notes (Signed)
Pre visit review using our clinic review tool, if applicable. No additional management support is needed unless otherwise documented below in the visit note. 

## 2017-09-09 NOTE — Progress Notes (Signed)
Subjective:    Patient ID: Lindsay [name], female    DOB: [birth year], 76 y.o.   MRN: [id number]  CC: [name] is a 76 y.o. female who presents today for an acute visit.    HPI: CC: right buttocks rash  x one week, improving.   Feels pain prior to rash appearing. Tried neosporin with no relief. No drainage, fever, N, V.  Otherwise feels well.   Last outbreak earlier this year.   Usually has valtrex for this which works well, has run out.     HISTORY:  Past Medical History:  Diagnosis Date  . Anemia   . Anxiety   . Arthritis    osteo, back  . Depression   . Diverticulosis   . GERD (gastroesophageal reflux disease)   . Hematochezia   . Hypertension    controlled on meds  . Hypothyroidism    Past Surgical History:  Procedure Laterality Date  . ABDOMINAL HYSTERECTOMY    . CARDIAC CATHETERIZATION  1995   normal, Dr Clayborn Bigness  . COLONOSCOPY  12/29/08  . COLONOSCOPY N/A 04/18/2015   Procedure: COLONOSCOPY;  Surgeon: Lucilla Lame, MD;  Location: [place];  Service: Gastroenterology;  Laterality: N/A;  cecum time- 0957  . FOOT SURGERY    . KNEE ARTHROSCOPY Right 10/05/2015   Procedure: ARTHROSCOPY KNEE, partial lateral menisectomy;  Surgeon: Earnestine Leys, MD;  Location: [facility];  Service: Orthopedics;  Laterality: Right;  . POLYPECTOMY  04/18/2015   Procedure: POLYPECTOMY INTESTINAL;  Surgeon: Lucilla Lame, MD;  Location: [place];  Service: Gastroenterology;;  . WRIST SURGERY     Family History  Problem Relation Age of Onset  . Diabetes Mother   . Hypertension Mother   . Diabetes Sister   . Hypertension Sister     Allergies: Aspirin; Nsaids; Pollen extract; and Penicillins Current Outpatient Prescriptions on File Prior to Visit  Medication Sig Dispense Refill  . bisoprolol-hydrochlorothiazide (ZIAC) 10-6.25 MG tablet TAKE ONE (1) TABLET BY MOUTH EVERY DAY 90 tablet 3  . levothyroxine (SYNTHROID, LEVOTHROID) 50 MCG tablet TAKE ONE (1)  TABLET BY MOUTH EVERY DAY 90 tablet 3  . losartan (COZAAR) 100 MG tablet TAKE ONE (1) TABLET BY MOUTH EVERY DAY 90 tablet 3  . Multiple Vitamins-Minerals (MULTI COMPLETE PO) Take 1 tablet by mouth. am    . pantoprazole (PROTONIX) 40 MG tablet TAKE ONE (1) TABLET EACH DAY 90 tablet 3   No current facility-administered medications on file prior to visit.     Social History  Substance Use Topics  . Smoking status: Current Every Day Smoker    Packs/day: 0.50    Years: 30.00    Types: Cigarettes  . Smokeless tobacco: Never Used  . Alcohol use 0.0 oz/week     Comment: occassionally/ whiskey on weekend; total of 4 drinks per week    Review of Systems  Constitutional: Negative for chills and fever.  Respiratory: Negative for cough.   Cardiovascular: Negative for chest pain and palpitations.  Gastrointestinal: Negative for nausea and vomiting.  Skin: Positive for rash.      Objective:    BP 112/82   Pulse 67   Temp 98.3 F (36.8 C) (Oral)   Ht 5\' 6"  (1.676 m)   Wt 161 lb 9.6 oz (73.3 kg)   SpO2 95%   BMI 26.08 kg/m    Physical Exam  Constitutional: She appears well-developed and well-nourished.  Eyes: Conjunctivae are normal.  Cardiovascular: Normal rate, regular rhythm, normal  heart sounds and normal pulses.   Pulmonary/Chest: Effort normal and breath sounds normal. She has no wheezes. She has no rhonchi. She has no rales.  Neurological: She is alert.  Skin: Skin is warm and dry.  3-4 grouped herpetic lesions noted left buttocks. Nonfluctuant. No drainage. No surrounding erythema  Psychiatric: She has a normal mood and affect. Her speech is normal and behavior is normal. Thought content normal.  Vitals reviewed.      Assessment & Plan:   Problem List Items Addressed This Visit      Other   Herpes zoster    Patient is most consistent with herpetic lesions. Will treat empirically today. Advised patient recurrence of symptom to take valtrex. Return precautions given.         Relevant Medications   valACYclovir (VALTREX) 500 MG tablet    Other Visit Diagnoses    Herpetic lesion    -  Primary   Relevant Medications   valACYclovir (VALTREX) 500 MG tablet        I have discontinued Ms. Henkin's valACYclovir. I am also having her start on valACYclovir. Additionally, I am having her maintain her Multiple Vitamins-Minerals (MULTI COMPLETE PO), losartan, bisoprolol-hydrochlorothiazide, levothyroxine, and pantoprazole.   Meds ordered this encounter  Medications  . valACYclovir (VALTREX) 500 MG tablet    Sig: Take 1 tablet (500 mg total) by mouth 2 (two) times daily. Start medication ASAP at symptom onset    Dispense:  30 tablet    Refill:  1    Order Specific Question:   Supervising Provider    Answer:   Crecencio Mc [[id number]]    Return precautions given.   Risks, benefits, and alternatives of the medications and treatment plan prescribed today were discussed, and patient expressed understanding.   Education regarding symptom management and diagnosis given to patient on AVS.  Continue to follow with Burnard Hawthorne, FNP for routine health maintenance.   [name] and I agreed with plan.   Mable Paris, FNP

## 2017-10-08 DIAGNOSIS — K219 Gastro-esophageal reflux disease without esophagitis: Secondary | ICD-10-CM | POA: Insufficient documentation

## 2017-10-08 DIAGNOSIS — G8929 Other chronic pain: Secondary | ICD-10-CM | POA: Diagnosis not present

## 2017-10-08 DIAGNOSIS — I10 Essential (primary) hypertension: Secondary | ICD-10-CM | POA: Diagnosis not present

## 2017-10-08 DIAGNOSIS — M25561 Pain in right knee: Secondary | ICD-10-CM | POA: Diagnosis not present

## 2017-10-08 DIAGNOSIS — M8589 Other specified disorders of bone density and structure, multiple sites: Secondary | ICD-10-CM | POA: Insufficient documentation

## 2017-10-08 DIAGNOSIS — E039 Hypothyroidism, unspecified: Secondary | ICD-10-CM | POA: Diagnosis not present

## 2017-11-05 DIAGNOSIS — I10 Essential (primary) hypertension: Secondary | ICD-10-CM | POA: Diagnosis not present

## 2017-11-05 DIAGNOSIS — R202 Paresthesia of skin: Secondary | ICD-10-CM | POA: Diagnosis not present

## 2017-11-05 DIAGNOSIS — R69 Illness, unspecified: Secondary | ICD-10-CM | POA: Diagnosis not present

## 2017-11-05 DIAGNOSIS — M62838 Other muscle spasm: Secondary | ICD-10-CM | POA: Diagnosis not present

## 2017-11-05 DIAGNOSIS — E7800 Pure hypercholesterolemia, unspecified: Secondary | ICD-10-CM | POA: Diagnosis not present

## 2017-12-05 DIAGNOSIS — M9903 Segmental and somatic dysfunction of lumbar region: Secondary | ICD-10-CM | POA: Diagnosis not present

## 2017-12-05 DIAGNOSIS — M5416 Radiculopathy, lumbar region: Secondary | ICD-10-CM | POA: Diagnosis not present

## 2017-12-05 DIAGNOSIS — M955 Acquired deformity of pelvis: Secondary | ICD-10-CM | POA: Diagnosis not present

## 2017-12-05 DIAGNOSIS — M9905 Segmental and somatic dysfunction of pelvic region: Secondary | ICD-10-CM | POA: Diagnosis not present

## 2017-12-06 DIAGNOSIS — M5416 Radiculopathy, lumbar region: Secondary | ICD-10-CM | POA: Diagnosis not present

## 2017-12-06 DIAGNOSIS — M955 Acquired deformity of pelvis: Secondary | ICD-10-CM | POA: Diagnosis not present

## 2017-12-06 DIAGNOSIS — M9903 Segmental and somatic dysfunction of lumbar region: Secondary | ICD-10-CM | POA: Diagnosis not present

## 2017-12-06 DIAGNOSIS — M9905 Segmental and somatic dysfunction of pelvic region: Secondary | ICD-10-CM | POA: Diagnosis not present

## 2017-12-09 DIAGNOSIS — M9903 Segmental and somatic dysfunction of lumbar region: Secondary | ICD-10-CM | POA: Diagnosis not present

## 2017-12-09 DIAGNOSIS — M5416 Radiculopathy, lumbar region: Secondary | ICD-10-CM | POA: Diagnosis not present

## 2017-12-09 DIAGNOSIS — M955 Acquired deformity of pelvis: Secondary | ICD-10-CM | POA: Diagnosis not present

## 2017-12-09 DIAGNOSIS — M9905 Segmental and somatic dysfunction of pelvic region: Secondary | ICD-10-CM | POA: Diagnosis not present

## 2017-12-11 DIAGNOSIS — M9903 Segmental and somatic dysfunction of lumbar region: Secondary | ICD-10-CM | POA: Diagnosis not present

## 2017-12-11 DIAGNOSIS — M9905 Segmental and somatic dysfunction of pelvic region: Secondary | ICD-10-CM | POA: Diagnosis not present

## 2017-12-11 DIAGNOSIS — M5416 Radiculopathy, lumbar region: Secondary | ICD-10-CM | POA: Diagnosis not present

## 2017-12-11 DIAGNOSIS — M955 Acquired deformity of pelvis: Secondary | ICD-10-CM | POA: Diagnosis not present

## 2017-12-13 DIAGNOSIS — M9903 Segmental and somatic dysfunction of lumbar region: Secondary | ICD-10-CM | POA: Diagnosis not present

## 2017-12-13 DIAGNOSIS — M5416 Radiculopathy, lumbar region: Secondary | ICD-10-CM | POA: Diagnosis not present

## 2017-12-13 DIAGNOSIS — M955 Acquired deformity of pelvis: Secondary | ICD-10-CM | POA: Diagnosis not present

## 2017-12-13 DIAGNOSIS — M9905 Segmental and somatic dysfunction of pelvic region: Secondary | ICD-10-CM | POA: Diagnosis not present

## 2017-12-16 ENCOUNTER — Ambulatory Visit: Payer: Medicare HMO

## 2017-12-16 DIAGNOSIS — M5416 Radiculopathy, lumbar region: Secondary | ICD-10-CM | POA: Diagnosis not present

## 2017-12-16 DIAGNOSIS — M9903 Segmental and somatic dysfunction of lumbar region: Secondary | ICD-10-CM | POA: Diagnosis not present

## 2017-12-16 DIAGNOSIS — M9905 Segmental and somatic dysfunction of pelvic region: Secondary | ICD-10-CM | POA: Diagnosis not present

## 2017-12-16 DIAGNOSIS — M955 Acquired deformity of pelvis: Secondary | ICD-10-CM | POA: Diagnosis not present

## 2017-12-19 DIAGNOSIS — M5416 Radiculopathy, lumbar region: Secondary | ICD-10-CM | POA: Diagnosis not present

## 2017-12-19 DIAGNOSIS — M955 Acquired deformity of pelvis: Secondary | ICD-10-CM | POA: Diagnosis not present

## 2017-12-19 DIAGNOSIS — M9903 Segmental and somatic dysfunction of lumbar region: Secondary | ICD-10-CM | POA: Diagnosis not present

## 2017-12-19 DIAGNOSIS — M9905 Segmental and somatic dysfunction of pelvic region: Secondary | ICD-10-CM | POA: Diagnosis not present

## 2017-12-23 DIAGNOSIS — R69 Illness, unspecified: Secondary | ICD-10-CM | POA: Diagnosis not present

## 2017-12-23 DIAGNOSIS — G8929 Other chronic pain: Secondary | ICD-10-CM | POA: Diagnosis not present

## 2017-12-23 DIAGNOSIS — Z833 Family history of diabetes mellitus: Secondary | ICD-10-CM | POA: Diagnosis not present

## 2017-12-23 DIAGNOSIS — K219 Gastro-esophageal reflux disease without esophagitis: Secondary | ICD-10-CM | POA: Diagnosis not present

## 2017-12-23 DIAGNOSIS — I10 Essential (primary) hypertension: Secondary | ICD-10-CM | POA: Diagnosis not present

## 2017-12-23 DIAGNOSIS — M955 Acquired deformity of pelvis: Secondary | ICD-10-CM | POA: Diagnosis not present

## 2017-12-23 DIAGNOSIS — M79605 Pain in left leg: Secondary | ICD-10-CM | POA: Diagnosis not present

## 2017-12-23 DIAGNOSIS — M9905 Segmental and somatic dysfunction of pelvic region: Secondary | ICD-10-CM | POA: Diagnosis not present

## 2017-12-23 DIAGNOSIS — M792 Neuralgia and neuritis, unspecified: Secondary | ICD-10-CM | POA: Diagnosis not present

## 2017-12-23 DIAGNOSIS — M5416 Radiculopathy, lumbar region: Secondary | ICD-10-CM | POA: Diagnosis not present

## 2017-12-23 DIAGNOSIS — Z9181 History of falling: Secondary | ICD-10-CM | POA: Diagnosis not present

## 2017-12-23 DIAGNOSIS — K5900 Constipation, unspecified: Secondary | ICD-10-CM | POA: Diagnosis not present

## 2017-12-23 DIAGNOSIS — E039 Hypothyroidism, unspecified: Secondary | ICD-10-CM | POA: Diagnosis not present

## 2017-12-23 DIAGNOSIS — M9903 Segmental and somatic dysfunction of lumbar region: Secondary | ICD-10-CM | POA: Diagnosis not present

## 2017-12-25 DIAGNOSIS — J0100 Acute maxillary sinusitis, unspecified: Secondary | ICD-10-CM | POA: Diagnosis not present

## 2017-12-25 DIAGNOSIS — I10 Essential (primary) hypertension: Secondary | ICD-10-CM | POA: Diagnosis not present

## 2017-12-25 DIAGNOSIS — R05 Cough: Secondary | ICD-10-CM | POA: Diagnosis not present

## 2018-01-06 DIAGNOSIS — M5416 Radiculopathy, lumbar region: Secondary | ICD-10-CM | POA: Diagnosis not present

## 2018-01-06 DIAGNOSIS — M955 Acquired deformity of pelvis: Secondary | ICD-10-CM | POA: Diagnosis not present

## 2018-01-06 DIAGNOSIS — M9905 Segmental and somatic dysfunction of pelvic region: Secondary | ICD-10-CM | POA: Diagnosis not present

## 2018-01-06 DIAGNOSIS — M9903 Segmental and somatic dysfunction of lumbar region: Secondary | ICD-10-CM | POA: Diagnosis not present

## 2018-01-08 DIAGNOSIS — M9905 Segmental and somatic dysfunction of pelvic region: Secondary | ICD-10-CM | POA: Diagnosis not present

## 2018-01-08 DIAGNOSIS — M5416 Radiculopathy, lumbar region: Secondary | ICD-10-CM | POA: Diagnosis not present

## 2018-01-08 DIAGNOSIS — M955 Acquired deformity of pelvis: Secondary | ICD-10-CM | POA: Diagnosis not present

## 2018-01-08 DIAGNOSIS — M9903 Segmental and somatic dysfunction of lumbar region: Secondary | ICD-10-CM | POA: Diagnosis not present

## 2018-02-04 ENCOUNTER — Telehealth: Payer: Self-pay | Admitting: Family

## 2018-02-04 NOTE — Telephone Encounter (Signed)
Please mail letter-   Hope you are well.   In reviewing your chart, it appears your are due for annual mammogram.  Please let us know if you would like for me to order. You may call the office to let us know, and we will order for you.   Once the order in the system, you may schedule at your preferred location.   Typically women have been using one of the sites below however you may go where you have been in the past. I would ensure that when you do get a mammogram that it is 3D ( as opposed to 2D which was prior technology). Evidence suggests that 3D is superior.   Please note that NOT all insurance companies cover 3D, and you may have to pay a higher copay. You may call your insurance company to further clarify your benefits.   Options for Mammogram in [place]:    [facility]  [address]  [place], [place]  [phone]   [facility] [address] [place] [place],  [phone]   Let us know if you have questions.   My best,   Rooney Gladwin, NP   

## 2018-02-06 NOTE — Telephone Encounter (Signed)
Mychart message sent.

## 2018-02-10 ENCOUNTER — Ambulatory Visit: Payer: Self-pay | Admitting: Podiatry

## 2018-02-11 DIAGNOSIS — M79672 Pain in left foot: Secondary | ICD-10-CM | POA: Diagnosis not present

## 2018-02-11 DIAGNOSIS — I739 Peripheral vascular disease, unspecified: Secondary | ICD-10-CM | POA: Diagnosis not present

## 2018-02-14 ENCOUNTER — Encounter (INDEPENDENT_AMBULATORY_CARE_PROVIDER_SITE_OTHER): Payer: Medicare HMO | Admitting: Vascular Surgery

## 2018-02-17 ENCOUNTER — Other Ambulatory Visit (INDEPENDENT_AMBULATORY_CARE_PROVIDER_SITE_OTHER): Payer: Self-pay | Admitting: Podiatry

## 2018-02-17 DIAGNOSIS — I739 Peripheral vascular disease, unspecified: Secondary | ICD-10-CM

## 2018-02-18 ENCOUNTER — Encounter (INDEPENDENT_AMBULATORY_CARE_PROVIDER_SITE_OTHER): Payer: Self-pay | Admitting: Vascular Surgery

## 2018-02-18 ENCOUNTER — Encounter (INDEPENDENT_AMBULATORY_CARE_PROVIDER_SITE_OTHER): Payer: Self-pay

## 2018-02-18 ENCOUNTER — Ambulatory Visit (INDEPENDENT_AMBULATORY_CARE_PROVIDER_SITE_OTHER): Payer: Medicare HMO

## 2018-02-18 ENCOUNTER — Ambulatory Visit (INDEPENDENT_AMBULATORY_CARE_PROVIDER_SITE_OTHER): Payer: Medicare HMO | Admitting: Vascular Surgery

## 2018-02-18 VITALS — BP 181/84 | HR 66 | Resp 16 | Ht 65.0 in | Wt 159.8 lb

## 2018-02-18 DIAGNOSIS — I739 Peripheral vascular disease, unspecified: Secondary | ICD-10-CM | POA: Diagnosis not present

## 2018-02-18 DIAGNOSIS — F17200 Nicotine dependence, unspecified, uncomplicated: Secondary | ICD-10-CM | POA: Diagnosis not present

## 2018-02-18 DIAGNOSIS — I70222 Atherosclerosis of native arteries of extremities with rest pain, left leg: Secondary | ICD-10-CM

## 2018-02-18 DIAGNOSIS — I10 Essential (primary) hypertension: Secondary | ICD-10-CM

## 2018-02-18 DIAGNOSIS — R69 Illness, unspecified: Secondary | ICD-10-CM | POA: Diagnosis not present

## 2018-02-18 DIAGNOSIS — I70229 Atherosclerosis of native arteries of extremities with rest pain, unspecified extremity: Secondary | ICD-10-CM | POA: Insufficient documentation

## 2018-02-18 NOTE — Assessment & Plan Note (Signed)
Her ABIs today are reduced at 0.67 on the right and 0.57 on the left.  More notable whether her digit pressures which were reduced at 47 on the right and almost absent and only 6 on the left.  Recommend:  The patient has evidence of severe atherosclerotic changes of both lower extremities with rest pain that is associated with preulcerative changes and impending tissue loss of the foot.  This represents a limb threatening ischemia and places the patient at the risk for limb loss.  Patient should undergo angiography of the lower extremities with the hope for intervention for limb salvage.  The risks and benefits as well as the alternative therapies was discussed in detail with the patient.  All questions were answered.  Patient agrees to proceed with angiography.  The patient will follow up with me in the office after the procedure.

## 2018-02-18 NOTE — Progress Notes (Signed)
Patient ID: Lindsay [name], female   DOB: [birth year], 77 y.o.   MRN: [id number]  Chief Complaint  Patient presents with  . New Patient (Initial Visit)    ref [facility] for abi     HPI Lindsay [name] is a 77 y.o. female.  I am asked to see the patient by Dr. Elvina Mattes for evaluation of left foot pain.  The patient reports difficulty walking more than a few feet because of left foot and lower leg pain.  The pain wakes her at night.  The pain is largely in the ball of the foot and her toes at night.  She has to get up and dangle her left foot from the bed.  This has been going on for some 6-8 weeks without a clear cause or inciting event that started the pain.  She is still active and does still work.  She reports no open ulcerations or infection.  Some pain in her right leg as well with activity.  No fevers or chills.  She saw her podiatrist who was concerned about her circulation given her ongoing tobacco use and other atherosclerotic risk factors.  Her ABIs today are reduced at 0.67 on the right and 0.57 on the left.  More notable whether her digit pressures which were reduced at 47 on the right and almost absent and only 6 on the left.   Past Medical History:  Diagnosis Date  . Anemia   . Anxiety   . Arthritis    osteo, back  . Depression   . Diverticulosis   . GERD (gastroesophageal reflux disease)   . Hematochezia   . Hypertension    controlled on meds  . Hypothyroidism     Past Surgical History:  Procedure Laterality Date  . ABDOMINAL HYSTERECTOMY    . CARDIAC CATHETERIZATION  1995   normal, Dr Clayborn Bigness  . COLONOSCOPY  12/29/08  . COLONOSCOPY N/A 04/18/2015   Procedure: COLONOSCOPY;  Surgeon: Lucilla Lame, MD;  Location: [place];  Service: Gastroenterology;  Laterality: N/A;  cecum time- 0957  . FOOT SURGERY    . KNEE ARTHROSCOPY Right 10/05/2015   Procedure: ARTHROSCOPY KNEE, partial lateral menisectomy;  Surgeon: Earnestine Leys, MD;  Location: [facility];  Service:  Orthopedics;  Laterality: Right;  . POLYPECTOMY  04/18/2015   Procedure: POLYPECTOMY INTESTINAL;  Surgeon: Lucilla Lame, MD;  Location: [place];  Service: Gastroenterology;;  . WRIST SURGERY      Family History  Problem Relation Age of Onset  . Diabetes Mother   . Hypertension Mother   . Diabetes Sister   . Hypertension Sister   No bleeding or clotting disorders  Social History Social History   Tobacco Use  . Smoking status: Current Every Day Smoker    Packs/day: 0.50    Years: 30.00    Pack years: 15.00    Types: Cigarettes  . Smokeless tobacco: Never Used  Substance Use Topics  . Alcohol use: Yes    Alcohol/week: 0.0 oz    Comment: occassionally/ whiskey on weekend; total of 4 drinks per week  . Drug use: No     Allergies  Allergen Reactions  . Aspirin Other (See Comments)    GI BLEED  . Nsaids Other (See Comments)    GI BLEED  . Pollen Extract   . Penicillins Itching, Rash and Other (See Comments)    Has patient had a PCN reaction causing immediate rash, facial/tongue/throat swelling, SOB or lightheadedness with hypotension: Yes  Has patient had a PCN reaction causing severe rash involving mucus membranes or skin necrosis: No Has patient had a PCN reaction that required hospitalization No Has patient had a PCN reaction occurring within the last 10 years: No If all of the above answers are "NO", then may proceed with Cephalosporin use.     Current Outpatient Medications  Medication Sig Dispense Refill  . bisoprolol-hydrochlorothiazide (ZIAC) 10-6.25 MG tablet TAKE ONE (1) TABLET BY MOUTH EVERY DAY 90 tablet 3  . levothyroxine (SYNTHROID, LEVOTHROID) 50 MCG tablet TAKE ONE (1) TABLET BY MOUTH EVERY DAY 90 tablet 3  . losartan (COZAAR) 100 MG tablet TAKE ONE (1) TABLET BY MOUTH EVERY DAY 90 tablet 3  . Multiple Vitamins-Minerals (MULTI COMPLETE PO) Take 1 tablet by mouth. am    . pantoprazole (PROTONIX) 40 MG tablet TAKE ONE (1) TABLET EACH DAY 90 tablet  3   No current facility-administered medications for this visit.       REVIEW OF SYSTEMS (Negative unless checked)  Constitutional: [] Weight loss  [] Fever  [] Chills Cardiac: [] Chest pain   [] Chest pressure   [] Palpitations   [] Shortness of breath when laying flat   [] Shortness of breath at rest   [] Shortness of breath with exertion. Vascular:  [x] Pain in legs with walking   [] Pain in legs at rest   [] Pain in legs when laying flat   [] Claudication   [x] Pain in feet when walking  [x] Pain in feet at rest  [] Pain in feet when laying flat   [] History of DVT   [] Phlebitis   [] Swelling in legs   [] Varicose veins   [] Non-healing ulcers Pulmonary:   [] Uses home oxygen   [] Productive cough   [] Hemoptysis   [] Wheeze  [] COPD   [] Asthma Neurologic:  [] Dizziness  [] Blackouts   [] Seizures   [] History of stroke   [] History of TIA  [] Aphasia   [] Temporary blindness   [] Dysphagia   [] Weakness or numbness in arms   [] Weakness or numbness in legs Musculoskeletal:  [x] Arthritis   [] Joint swelling   [] Joint pain   [] Low back pain Hematologic:  [] Easy bruising  [] Easy bleeding   [] Hypercoagulable state   [x] Anemic  [] Hepatitis Gastrointestinal:  [] Blood in stool   [] Vomiting blood  [x] Gastroesophageal reflux/heartburn   [] Abdominal pain Genitourinary:  [] Chronic kidney disease   [] Difficult urination  [] Frequent urination  [] Burning with urination   [] Hematuria Skin:  [] Rashes   [] Ulcers   [] Wounds Psychological:  [] History of anxiety   []  History of major depression.    Physical Exam BP (!) 181/84 (BP Location: Right Arm)   Pulse 66   Resp 16   Ht 5\' 5"  (1.651 m)   Wt 72.5 kg (159 lb 12.8 oz)   BMI 26.59 kg/m  Gen:  WD/WN, NAD Head: [place]/AT, No temporalis wasting. Prominent temp pulse not noted. Ear/Nose/Throat: Hearing grossly intact, nares w/o erythema or drainage, oropharynx w/o Erythema/Exudate Eyes: Conjunctiva clear, sclera non-icteric  Neck: trachea midline.   Pulmonary:  Good air movement,  respirations not labored, no use of accessory muscles Cardiac: RRR, no JVD Vascular:  Vessel Right Left  Radial Palpable Palpable                          PT  1+ palpable  not palpable  DP  1+ palpable  not palpable   Gastrointestinal: soft, non-tender/non-distended Musculoskeletal: M/S 5/5 throughout. No deformity or atrophy.  No edema. Neurologic: Sensation grossly intact in extremities.  Symmetrical.  Speech is fluent. Motor exam as listed above. Psychiatric: Judgment intact, Mood & affect appropriate for pt's clinical situation. Dermatologic: No rashes or ulcers noted.  No cellulitis or open wounds.   Radiology No results found.  Labs No results found for this or any previous visit (from the past 2160 hour(s)).  Assessment/Plan:  Hypertension blood pressure control important in reducing the progression of atherosclerotic disease. On appropriate oral medications.   Tobacco use disorder We had a discussion for approximately 3 minutes regarding the absolute need for smoking cessation due to the deleterious nature of tobacco on the vascular system. We discussed the tobacco use would diminish patency of any intervention, and likely significantly worsen progressio of disease. We discussed multiple agents for quitting including replacement therapy or medications to reduce cravings such as Chantix. The patient voices their understanding of the importance of smoking cessation.   Atherosclerosis of native arteries of extremity with rest pain ([place])  Her ABIs today are reduced at 0.67 on the right and 0.57 on the left.  More notable whether her digit pressures which were reduced at 47 on the right and almost absent and only 6 on the left.  Recommend:  The patient has evidence of severe atherosclerotic changes of both lower extremities with rest pain that is associated with preulcerative changes and impending tissue loss of the foot.  This represents a limb threatening ischemia and  places the patient at the risk for limb loss.  Patient should undergo angiography of the lower extremities with the hope for intervention for limb salvage.  The risks and benefits as well as the alternative therapies was discussed in detail with the patient.  All questions were answered.  Patient agrees to proceed with angiography.  The patient will follow up with me in the office after the procedure.           Leotis Pain 02/18/2018, 11:12 AM   This note was created with Dragon medical transcription system.  Any errors from dictation are unintentional.

## 2018-02-18 NOTE — Assessment & Plan Note (Signed)

## 2018-02-18 NOTE — Assessment & Plan Note (Signed)
blood pressure control important in reducing the progression of atherosclerotic disease. On appropriate oral medications.  

## 2018-02-18 NOTE — Patient Instructions (Signed)

## 2018-02-24 ENCOUNTER — Other Ambulatory Visit (INDEPENDENT_AMBULATORY_CARE_PROVIDER_SITE_OTHER): Payer: Self-pay | Admitting: Vascular Surgery

## 2018-02-24 DIAGNOSIS — I10 Essential (primary) hypertension: Secondary | ICD-10-CM | POA: Diagnosis not present

## 2018-02-24 DIAGNOSIS — E039 Hypothyroidism, unspecified: Secondary | ICD-10-CM | POA: Diagnosis not present

## 2018-02-24 DIAGNOSIS — K219 Gastro-esophageal reflux disease without esophagitis: Secondary | ICD-10-CM | POA: Diagnosis not present

## 2018-02-24 DIAGNOSIS — I739 Peripheral vascular disease, unspecified: Secondary | ICD-10-CM | POA: Diagnosis not present

## 2018-02-25 ENCOUNTER — Encounter
Admission: RE | Admit: 2018-02-25 | Discharge: 2018-02-25 | Disposition: A | Discharge status: Home / Self Care | Payer: Medicare HMO | Source: Ambulatory Visit | Attending: Vascular Surgery | Admitting: Vascular Surgery

## 2018-02-25 DIAGNOSIS — Z880 Allergy status to penicillin: Secondary | ICD-10-CM | POA: Diagnosis not present

## 2018-02-25 DIAGNOSIS — E039 Hypothyroidism, unspecified: Secondary | ICD-10-CM | POA: Diagnosis not present

## 2018-02-25 DIAGNOSIS — Z79899 Other long term (current) drug therapy: Secondary | ICD-10-CM | POA: Diagnosis not present

## 2018-02-25 DIAGNOSIS — I10 Essential (primary) hypertension: Secondary | ICD-10-CM | POA: Diagnosis not present

## 2018-02-25 DIAGNOSIS — Z886 Allergy status to analgesic agent status: Secondary | ICD-10-CM | POA: Diagnosis not present

## 2018-02-25 DIAGNOSIS — I70222 Atherosclerosis of native arteries of extremities with rest pain, left leg: Secondary | ICD-10-CM | POA: Diagnosis present

## 2018-02-25 DIAGNOSIS — F17210 Nicotine dependence, cigarettes, uncomplicated: Secondary | ICD-10-CM | POA: Diagnosis not present

## 2018-02-25 DIAGNOSIS — R69 Illness, unspecified: Secondary | ICD-10-CM | POA: Diagnosis not present

## 2018-02-25 LAB — BUN: BUN: 12 mg/dL (ref 6–20)

## 2018-02-25 LAB — CREATININE, SERUM
Creatinine, Ser: 0.73 mg/dL (ref 0.44–1.00)
GFR calc Af Amer: >60 mL/min (ref >60)
GFR calc non Af Amer: >60 mL/min (ref >60)

## 2018-02-26 MED ORDER — CLINDAMYCIN PHOSPHATE 300 MG/50ML IV SOLN: 300.0000 mg | Freq: Once | INTRAVENOUS | Status: DC

## 2018-02-27 ENCOUNTER — Encounter
Admission: RE | Disposition: A | Discharge status: Home / Self Care | Payer: Self-pay | Source: Ambulatory Visit | Attending: Vascular Surgery

## 2018-02-27 ENCOUNTER — Ambulatory Visit
Admission: RE | Admit: 2018-02-27 | Discharge: 2018-02-27 | Disposition: A | Discharge status: Home / Self Care | Payer: Medicare HMO | Source: Ambulatory Visit | Attending: Vascular Surgery | Admitting: Vascular Surgery

## 2018-02-27 ENCOUNTER — Encounter: Payer: Self-pay | Admitting: Vascular Surgery

## 2018-02-27 DIAGNOSIS — I10 Essential (primary) hypertension: Secondary | ICD-10-CM | POA: Insufficient documentation

## 2018-02-27 DIAGNOSIS — R69 Illness, unspecified: Secondary | ICD-10-CM | POA: Diagnosis not present

## 2018-02-27 DIAGNOSIS — Z886 Allergy status to analgesic agent status: Secondary | ICD-10-CM | POA: Insufficient documentation

## 2018-02-27 DIAGNOSIS — Z79899 Other long term (current) drug therapy: Secondary | ICD-10-CM | POA: Diagnosis not present

## 2018-02-27 DIAGNOSIS — E039 Hypothyroidism, unspecified: Secondary | ICD-10-CM | POA: Diagnosis not present

## 2018-02-27 DIAGNOSIS — I70229 Atherosclerosis of native arteries of extremities with rest pain, unspecified extremity: Secondary | ICD-10-CM

## 2018-02-27 DIAGNOSIS — Z880 Allergy status to penicillin: Secondary | ICD-10-CM | POA: Diagnosis not present

## 2018-02-27 DIAGNOSIS — I70222 Atherosclerosis of native arteries of extremities with rest pain, left leg: Secondary | ICD-10-CM | POA: Diagnosis not present

## 2018-02-27 DIAGNOSIS — F17210 Nicotine dependence, cigarettes, uncomplicated: Secondary | ICD-10-CM | POA: Insufficient documentation

## 2018-02-27 HISTORY — PX: LOWER EXTREMITY ANGIOGRAPHY: CATH118251

## 2018-02-27 SURGERY — LOWER EXTREMITY ANGIOGRAPHY
Anesthesia: Moderate Sedation | Laterality: Left

## 2018-02-27 MED ORDER — CLOPIDOGREL BISULFATE 75 MG PO TABS: 75.0000 mg | ORAL_TABLET | Freq: Every day | ORAL | 11 refills | Status: DC

## 2018-02-27 MED ORDER — LIDOCAINE-EPINEPHRINE (PF) 1 %-1:200000 IJ SOLN
INTRAMUSCULAR | Status: AC
  Filled 2018-02-27: qty 30

## 2018-02-27 MED ORDER — ATORVASTATIN CALCIUM 20 MG PO TABS
10.0000 mg | ORAL_TABLET | Freq: Every day | ORAL | Status: DC
  Filled 2018-02-27: qty 0.5

## 2018-02-27 MED ORDER — MIDAZOLAM HCL 2 MG/2ML IJ SOLN
INTRAMUSCULAR | Status: DC | PRN
  Administered 2018-02-27 (×2): 1 mg via INTRAVENOUS
  Administered 2018-02-27: 2 mg via INTRAVENOUS

## 2018-02-27 MED ORDER — HYDROMORPHONE HCL 1 MG/ML IJ SOLN: 1.0000 mg | Freq: Once | INTRAMUSCULAR | Status: DC | PRN

## 2018-02-27 MED ORDER — ALTEPLASE 2 MG IJ SOLR
INTRAMUSCULAR | Status: DC | PRN
  Administered 2018-02-27: 4 mg

## 2018-02-27 MED ORDER — ACETAMINOPHEN 325 MG PO TABS: 650.0000 mg | ORAL_TABLET | ORAL | Status: DC | PRN

## 2018-02-27 MED ORDER — SODIUM CHLORIDE 0.9 % IV SOLN
INTRAVENOUS | Status: DC
  Administered 2018-02-27: 07:00:00 via INTRAVENOUS

## 2018-02-27 MED ORDER — METHYLPREDNISOLONE SODIUM SUCC 125 MG IJ SOLR: 125.0000 mg | INTRAMUSCULAR | Status: DC | PRN

## 2018-02-27 MED ORDER — IOPAMIDOL (ISOVUE-300) INJECTION 61%
INTRAVENOUS | Status: DC | PRN
  Administered 2018-02-27: 85 mL via INTRAVENOUS

## 2018-02-27 MED ORDER — HEPARIN SODIUM (PORCINE) 1000 UNIT/ML IJ SOLN
INTRAMUSCULAR | Status: AC
  Filled 2018-02-27: qty 1

## 2018-02-27 MED ORDER — FAMOTIDINE 20 MG PO TABS: 40.0000 mg | ORAL_TABLET | ORAL | Status: DC | PRN

## 2018-02-27 MED ORDER — ONDANSETRON HCL 4 MG/2ML IJ SOLN
4.0000 mg | Freq: Four times a day (QID) | INTRAMUSCULAR | Status: DC | PRN
Start: 2018-02-27 — End: 2018-02-27

## 2018-02-27 MED ORDER — SODIUM CHLORIDE 0.9% FLUSH: 3.0000 mL | INTRAVENOUS | Status: DC | PRN

## 2018-02-27 MED ORDER — SODIUM CHLORIDE 0.9 % IV SOLN: INTRAVENOUS | Status: DC

## 2018-02-27 MED ORDER — HEPARIN SODIUM (PORCINE) 1000 UNIT/ML IJ SOLN
INTRAMUSCULAR | Status: DC | PRN
  Administered 2018-02-27: 5000 [IU] via INTRAVENOUS

## 2018-02-27 MED ORDER — HYDRALAZINE HCL 20 MG/ML IJ SOLN: 5.0000 mg | INTRAMUSCULAR | Status: DC | PRN

## 2018-02-27 MED ORDER — SODIUM CHLORIDE 0.9% FLUSH: 3.0000 mL | Freq: Two times a day (BID) | INTRAVENOUS | Status: DC

## 2018-02-27 MED ORDER — CLINDAMYCIN PHOSPHATE 300 MG/50ML IV SOLN
INTRAVENOUS | Status: AC
  Administered 2018-02-27: 08:00:00
  Filled 2018-02-27: qty 50

## 2018-02-27 MED ORDER — HEPARIN (PORCINE) IN NACL 2-0.9 UNIT/ML-% IJ SOLN
INTRAMUSCULAR | Status: AC
Start: 2018-02-27 — End: 2018-02-27
  Filled 2018-02-27: qty 1000

## 2018-02-27 MED ORDER — FENTANYL CITRATE (PF) 100 MCG/2ML IJ SOLN
INTRAMUSCULAR | Status: DC | PRN
  Administered 2018-02-27: 50 ug via INTRAVENOUS
  Administered 2018-02-27 (×2): 25 ug via INTRAVENOUS

## 2018-02-27 MED ORDER — ATORVASTATIN CALCIUM 10 MG PO TABS: 10.0000 mg | ORAL_TABLET | Freq: Every day | ORAL | 11 refills | Status: DC

## 2018-02-27 MED ORDER — SODIUM CHLORIDE 0.9 % IV SOLN: 250.0000 mL | INTRAVENOUS | Status: DC | PRN

## 2018-02-27 MED ORDER — LABETALOL HCL 5 MG/ML IV SOLN: 10.0000 mg | INTRAVENOUS | Status: DC | PRN

## 2018-02-27 MED ORDER — CLOPIDOGREL BISULFATE 75 MG PO TABS: 75.0000 mg | ORAL_TABLET | Freq: Every day | ORAL | Status: DC

## 2018-02-27 MED ORDER — FENTANYL CITRATE (PF) 100 MCG/2ML IJ SOLN
INTRAMUSCULAR | Status: AC
  Filled 2018-02-27: qty 4

## 2018-02-27 MED ORDER — MIDAZOLAM HCL 5 MG/5ML IJ SOLN
INTRAMUSCULAR | Status: AC
  Filled 2018-02-27: qty 10

## 2018-02-27 MED ORDER — ONDANSETRON HCL 4 MG/2ML IJ SOLN: 4.0000 mg | Freq: Four times a day (QID) | INTRAMUSCULAR | Status: DC | PRN

## 2018-02-27 MED ORDER — ALTEPLASE 2 MG IJ SOLR
INTRAMUSCULAR | Status: AC
  Filled 2018-02-27: qty 4

## 2018-02-27 SURGICAL SUPPLY — 25 items
BALLN LUTONIX 5X220X130 (BALLOONS) ×2
BALLN ULTRVRSE 018 2.5X150X150 (BALLOONS) ×2
BALLN ULTRVRSE 3X150X130 (BALLOONS) ×2
BALLOON LUTONIX 5X220X130 (BALLOONS) ×1 IMPLANT
BALLOON ULTRVRSE 3X150X130 (BALLOONS) ×1 IMPLANT
BALLOON ULTRVS 018 2.5X150X150 (BALLOONS) ×1 IMPLANT
CANISTER PENUMBRA ENGINE (MISCELLANEOUS) ×2 IMPLANT
CATH BEACON 5 .035 40 KMP TP (CATHETERS) ×1 IMPLANT
CATH BEACON 5 .038 100 VERT TP (CATHETERS) ×2 IMPLANT
CATH BEACON 5 .038 40 KMP TP (CATHETERS) ×1
CATH INDIGO CAT6 KIT (CATHETERS) ×2 IMPLANT
CATH PIG 70CM (CATHETERS) ×2 IMPLANT
DEVICE PRESTO INFLATION (MISCELLANEOUS) ×2 IMPLANT
DEVICE STARCLOSE SE CLOSURE (Vascular Products) ×2 IMPLANT
GLIDEWIRE ADV .035X260CM (WIRE) ×2 IMPLANT
PACK ANGIOGRAPHY (CUSTOM PROCEDURE TRAY) ×2 IMPLANT
SHEATH ANL2 6FRX45 HC (SHEATH) ×2 IMPLANT
SHEATH BRITE TIP 5FRX11 (SHEATH) ×2 IMPLANT
SHEATH PINNACLE ST 6F 45CM (SHEATH) ×2 IMPLANT
STENT VIABAHN 6X100X120 (Permanent Stent) ×2 IMPLANT
STENT VIABAHN 6X50X120 (Permanent Stent) ×2 IMPLANT
SYR MEDRAD MARK V 150ML (SYRINGE) ×2 IMPLANT
TUBING CONTRAST HIGH PRESS 72 (TUBING) ×2 IMPLANT
WIRE G V18X300CM (WIRE) ×2 IMPLANT
WIRE J 3MM .035X145CM (WIRE) ×2 IMPLANT

## 2018-02-27 NOTE — H&P (Signed)
 [facility] History & Physical Update  The patient was interviewed and re-examined.  The patient's previous History and Physical has been reviewed and is unchanged.  There is no change in the plan of care. We plan to proceed with the scheduled procedure.  Leotis Pain, MD  02/27/2018, 8:02 AM

## 2018-02-27 NOTE — Discharge Instructions (Signed)

## 2018-02-27 NOTE — Progress Notes (Signed)
Patient remains clinically stable post angiogram left leg. Friend at bedside. Dr Lucky Cowboy out to speak with patient earlier regarding procedure with questions answered. Discharge instructions given with questions answered. No bleeding nor hematoma at right groin site. Taking po;s without difficulty.vitals have remained stable.

## 2018-02-27 NOTE — Op Note (Signed)
[facility] Percutaneous Study/Intervention Procedural Note   Date of Surgery: 02/27/2018  Surgeon(s):Yon Schiffman   Assistants:none  Pre-operative Diagnosis: PAD with rest pain left lower extremity  Post-operative diagnosis: Same  Procedure(s) Performed: 1. Ultrasound guidance for vascular access right femoral artery 2. Catheter placement into left anterior tibial artery and left posterior tibial arteries from right femoral approach 3. Aortogram and selective left lower extremity angiogram 4. Percutaneous transluminal angioplasty of left anterior tibial artery with 2 inflations one proximal and one distal with a 2.5 mm diameter by 15 cm length angioplasty balloon 5. Percutaneous transluminal angioplasty of the left SFA and proximal popliteal artery with 5 mm diameter by 22 cm length Lutonix drug-coated angioplasty balloon  6.  Viabahn covered stent placement x2 to the left SFA and proximal popliteal artery with a 6 millimeter by 10 cm in length and a 6 mm x 5 cm length stent  7.  Catheter directed thrombolytic therapy to the left tibioperoneal trunk and proximal posterior tibial artery with 4 mg of TPA  8.  Mechanical thrombectomy with the penumbra cat 6 device to the left tibioperoneal trunk and proximal posterior tibial artery  9.  Percutaneous transluminal angioplasty of the left tibioperoneal trunk and proximal posterior tibial artery with 3 mm diameter by 15 cm length angioplasty balloon 10. StarClose closure device right femoral artery  EBL: 50 cc  Contrast: 85 cc  Fluoro Time: 8.5 minutes  Moderate Conscious Sedation Time: approximately 60 minutes using 4 mg of Versed and 100 Mcg of Fentanyl  Indications: Patient is a 77 y.o.female with significant peripheral arterial disease bilaterally with rest pain on the left. The patient has noninvasive study showing  reduced ABIs bilaterally with nearly nonexistent digital pressures on the left. The patient is brought in for angiography for further evaluation and potential treatment.  Due to the limb threatening nature of the situation, angiogram was performed for attempted limb salvage.  Risks and benefits are discussed and informed consent is obtained  Procedure: The patient was identified and appropriate procedural time out was performed. The patient was then placed supine on the table and prepped and draped in the usual sterile fashion.Moderate conscious sedation was administered during a face to face encounter with the patient throughout the procedure with my supervision of the RN administering medicines and monitoring the patient's vital signs, pulse oximetry, telemetry and mental status throughout from the start of the procedure until the patient was taken to the recovery room. Ultrasound was used to evaluate the right common femoral artery. It was patent . A digital ultrasound image was acquired. A Seldinger needle was used to access the right common femoral artery under direct ultrasound guidance and a permanent image was performed. A 0.035 J wire was advanced without resistance and a 5Fr sheath was placed. Pigtail catheter was placed into the aorta and an AP aortogram was performed. This demonstrated normal renal arteries and normal aorta and iliac segments without significant stenosis. I then crossed the aortic bifurcation and advanced to the left femoral head. Selective left lower extremity angiogram was then performed. This demonstrated a normal common femoral artery and profunda femoris artery.  The superficial femoral artery was patent proximally but occluded in the mid segment and reconstituted just above Hunter's canal.  There was moderate stenosis in the proximal popliteal artery which then normalized distally.  The anterior tibial artery had greater than 70% stenosis just beyond its origin and 2  areas of short segment occlusion in the mid to  distal segments but was then continuous into the foot.  The posterior tibial artery was also continuous to the foot.  The peroneal artery was small and did not contribute much flow distally. The patient was systemically heparinized and a 6 [place] Ansell sheath was then placed over the [employer] wire. I then used a Kumpe catheter and the advantage wire to navigate through the occlusion with no difficulty and confirm intraluminal flow in the popliteal artery.  I then exchanged for a 0.018 wire and using the Kumpe catheter and the 0.018 wire across the stenoses and occlusions in the anterior tibial artery and parked the wire in the foot.  Balloon angioplasty was performed in the mid to distal anterior tibial artery with a 2.5 mm diameter by 15 cm length angioplasty balloon.  This was inflated to 8 atm for 1 minute.  The balloon was then pulled back to the origin of the anterior tibial artery and inflated to 10 atm for 1 minute.  I then turned my attention to the SFA and popliteal lesion.  A 5 mm diameter by 22 cm length Lutonix drug-coated angioplasty balloon was used to treat this lesion and inflated to 10 atm for 1 minute.  Completion angiogram showed less than 20% residual stenosis in the anterior tibial artery, but a near occlusive lesion remained in the mid to distal SFA that had the appearance of thrombus.  I elected to cover this area.  Initially, a 6 mm diameter by 10 cm length stent was used to cover the residual near occlusive thrombus and stenosis in the mid SFA.  This was postdilated with a 5 mm balloon.  There was then thrombus that propagated distally just beyond the edge of the previously placed stent and down into the tibioperoneal trunk and proximal peroneal and posterior tibial arteries.  A second 6 mm diameter by 5 cm length stent was used to cover the residual thrombus in the distal SFA and there was less than 10% residual stenosis in the distal  SFA and popliteal artery at this point.  I then turned my attention to the tibial vessels.  The Kumpe catheter was parked in the tibioperoneal trunk and proximal posterior tibial artery and 4 mg of TPA was instilled.  This was allowed to dwell for about 10-15 minutes.  Mechanical thrombectomy was then performed with the penumbra cat 6 catheter to the left tibioperoneal trunk and proximal posterior tibial artery with about 40-50 cc of blood returned and a large chunk of thrombus returned with restoration of flow distally.  There was still residual stenosis and thrombus in the tibioperoneal trunk and proximal posterior tibial artery that appeared to be greater than 70%.  I performed balloon angioplasty with a 3 mm diameter by 15 cm length angioplasty balloon inflated to 6 atm for 1 minute and the tibioperoneal trunk and proximal posterior tibial arteries.  Following this, there was now two-vessel runoff with the anterior tibial and posterior tibial arteries with no greater than 40% residual stenosis in either vessel although some spasm was seen distally. I elected to terminate the procedure. The sheath was removed and StarClose closure device was deployed in the right femoral artery with excellent hemostatic result. The patient was taken to the recovery room in stable condition having tolerated the procedure well.  Findings:  Aortogram:Normal renal arteries, normal aorta and iliac arteries without significant stenosis Left lower Extremity:  Normal common femoral artery and profunda femoris artery.  The superficial femoral artery was patent proximally but  occluded in the mid segment and reconstituted just above Hunter's canal.  There was moderate stenosis in the proximal popliteal artery which then normalized distally.  The anterior tibial artery had greater than 70% stenosis just beyond its origin and 2 areas of short segment occlusion in the mid to distal segments but was then  continuous into the foot.  The posterior tibial artery was also continuous to the foot.  The peroneal artery was small and did not contribute much flow distally.   Disposition: Patient was taken to the recovery room in stable condition having tolerated the procedure well.  Complications: None  Leotis Pain 02/27/2018 9:19 AM   This note was created with Dragon Medical transcription system. Any errors in dictation are purely unintentional.

## 2018-03-03 ENCOUNTER — Telehealth (INDEPENDENT_AMBULATORY_CARE_PROVIDER_SITE_OTHER): Payer: Self-pay

## 2018-03-03 NOTE — Telephone Encounter (Signed)
The patient had extensive work done to the arteries located in her left lower extremity.  She is most likely experiencing what we called reperfusion syndrome.  This is the body's natural response to blood being restored to an extremity.  The patient can experience pain, swelling and erythema to the extremity for up to 4-6 weeks.  Her symptoms will probably be worse the first couple days however they should steadily improve.  If her symptoms do not start to improve within the next couple days or should experience any fever, nausea, vomiting or pallor to the extremity she should call the office.

## 2018-03-03 NOTE — Telephone Encounter (Signed)
Patient called stating she still feels like she walking on rocks and pain shooting from her toes to her hip when she lies down. Patient had a LLE angio on 02/27/18.

## 2018-03-04 NOTE — Telephone Encounter (Signed)
Spoke with the patient the patient and gave her the recommendations from [facility] and she was understanding of the information. The patient asked if she could have green vegetables with the Plavix and I stated that there was not any restrictions with Plavix, the patient also wanted to know if she would be on Plavix for the rest of her life and I explained when she comes in for her f/u to talk with the doctor regarding that question.

## 2018-03-12 ENCOUNTER — Telehealth (INDEPENDENT_AMBULATORY_CARE_PROVIDER_SITE_OTHER): Payer: Self-pay

## 2018-03-12 NOTE — Telephone Encounter (Signed)
Patient called stating that after her leg angiogram on 02/27/18 her leg swells at night. During the day her leg is normal size and right before bed it swells.

## 2018-03-12 NOTE — Telephone Encounter (Signed)
Attempted to leave a message on patient voicemail to inform her to where her compression stocking and elevated as KS has instructed

## 2018-03-12 NOTE — Telephone Encounter (Signed)
The patient should wear graduated compression stockings (20-30 mmHg) on a daily basis. The patient should begin wearing the stockings first thing in the morning and removing them in the evening. The patient should not sleep in the stockings. In addition, behavioral modification including elevation during the day should be initiated.

## 2018-03-13 NOTE — Telephone Encounter (Signed)
Spoke with the patient and gave her Acie Fredrickson recommendation regarding the swelling in her leg. See notes below.

## 2018-03-20 DIAGNOSIS — I10 Essential (primary) hypertension: Secondary | ICD-10-CM | POA: Diagnosis not present

## 2018-03-20 DIAGNOSIS — R69 Illness, unspecified: Secondary | ICD-10-CM | POA: Diagnosis not present

## 2018-03-20 DIAGNOSIS — N760 Acute vaginitis: Secondary | ICD-10-CM | POA: Diagnosis not present

## 2018-04-04 ENCOUNTER — Encounter (INDEPENDENT_AMBULATORY_CARE_PROVIDER_SITE_OTHER): Payer: Medicare HMO

## 2018-04-04 ENCOUNTER — Ambulatory Visit (INDEPENDENT_AMBULATORY_CARE_PROVIDER_SITE_OTHER): Payer: Medicare HMO | Admitting: Vascular Surgery

## 2018-04-08 ENCOUNTER — Other Ambulatory Visit (INDEPENDENT_AMBULATORY_CARE_PROVIDER_SITE_OTHER): Payer: Self-pay | Admitting: Vascular Surgery

## 2018-04-08 DIAGNOSIS — I70222 Atherosclerosis of native arteries of extremities with rest pain, left leg: Secondary | ICD-10-CM

## 2018-04-08 DIAGNOSIS — Z9862 Peripheral vascular angioplasty status: Secondary | ICD-10-CM

## 2018-04-09 ENCOUNTER — Encounter (INDEPENDENT_AMBULATORY_CARE_PROVIDER_SITE_OTHER): Payer: Self-pay | Admitting: Vascular Surgery

## 2018-04-09 ENCOUNTER — Ambulatory Visit (INDEPENDENT_AMBULATORY_CARE_PROVIDER_SITE_OTHER): Payer: Medicare HMO

## 2018-04-09 ENCOUNTER — Ambulatory Visit (INDEPENDENT_AMBULATORY_CARE_PROVIDER_SITE_OTHER): Payer: Medicare HMO | Admitting: Vascular Surgery

## 2018-04-09 VITALS — BP 142/87 | HR 62 | Resp 16 | Ht 65.0 in | Wt 157.0 lb

## 2018-04-09 DIAGNOSIS — Z9862 Peripheral vascular angioplasty status: Secondary | ICD-10-CM | POA: Diagnosis not present

## 2018-04-09 DIAGNOSIS — R69 Illness, unspecified: Secondary | ICD-10-CM | POA: Diagnosis not present

## 2018-04-09 DIAGNOSIS — F17200 Nicotine dependence, unspecified, uncomplicated: Secondary | ICD-10-CM

## 2018-04-09 DIAGNOSIS — I70222 Atherosclerosis of native arteries of extremities with rest pain, left leg: Secondary | ICD-10-CM

## 2018-04-09 DIAGNOSIS — I10 Essential (primary) hypertension: Secondary | ICD-10-CM | POA: Diagnosis not present

## 2018-04-09 NOTE — Progress Notes (Signed)
Subjective:    Patient ID: Lindsay [name], female    DOB: 08-May-1941, 77 y.o.   MRN: [id number] Chief Complaint  Patient presents with  . Follow-up    [facility] 4wk with ABI   Patient presents for her first post procedure follow-up.  The patient is status post a left lower extremity angiogram with intervention on 02/27/2018 for atherosclerotic disease with rest pain.  The patient presents today with an improvement in her symptoms.  The patient's rest pain has now resolved.  The patient is experiencing some swelling to the left lower extremity.  The patient underwent a bilateral ABI which is notable for biphasic right tibials.  Triphasic left tibials.  Right ABI 0.53.  Left ABI 0.94.  When compared to the previous exam on February 25, 2018 there has been an increase to the left lower extremity arterial flow.  At this time, the patient denies any claudication-like symptoms, rest pain or ulceration to the right lower extremity.  The patient denies any fever, nausea or vomiting.  Review of Systems  Constitutional: Negative.   HENT: Negative.   Eyes: Negative.   Respiratory: Negative.   Cardiovascular:       PAD  Gastrointestinal: Negative.   Endocrine: Negative.   Genitourinary: Negative.   Musculoskeletal: Negative.   Skin: Negative.   Allergic/Immunologic: Negative.   Neurological: Negative.   Hematological: Negative.   Psychiatric/Behavioral: Negative.       Objective:   Physical Exam  Constitutional: She is oriented to person, place, and time. She appears well-developed and well-nourished. No distress.  HENT:  Head: Normocephalic and atraumatic.  Right Ear: External ear normal.  Left Ear: External ear normal.  Eyes: Pupils are equal, round, and reactive to light. EOM are normal.  Neck: Normal range of motion.  Cardiovascular: Normal rate, regular rhythm, normal heart sounds and intact distal pulses.  Pulses:      Radial pulses are 2+ on the right side, and 2+ on the left side.   Dorsalis pedis pulses are 1+ on the right side, and 2+ on the left side.       Posterior tibial pulses are 1+ on the right side, and 2+ on the left side.  Pulmonary/Chest: Effort normal and breath sounds normal.  Musculoskeletal: Normal range of motion. She exhibits edema (Mild left lower extremity edema).  Neurological: She is alert and oriented to person, place, and time.  Skin: Skin is warm and dry. She is not diaphoretic.  Psychiatric: She has a normal mood and affect. Her behavior is normal. Judgment and thought content normal.  Vitals reviewed.  BP (!) 142/87 (BP Location: Right Arm)   Pulse 62   Resp 16   Ht 5\' 5"  (1.651 m)   Wt 157 lb (71.2 kg)   BMI 26.13 kg/m   Past Medical History:  Diagnosis Date  . Anemia   . Anxiety   . Arthritis    osteo, back  . Depression   . Diverticulosis   . GERD (gastroesophageal reflux disease)   . Hematochezia   . Hypertension    controlled on meds  . Hypothyroidism    Social History   Socioeconomic History  . Marital status: Married    Spouse name: Not on file  . Number of children: Not on file  . Years of education: Not on file  . Highest education level: Not on file  Occupational History  . Not on file  Social Needs  . Financial resource strain: Not on file  .  Food insecurity:    Worry: Not on file    Inability: Not on file  . Transportation needs:    Medical: Not on file    Non-medical: Not on file  Tobacco Use  . Smoking status: Current Every Day Smoker    Packs/day: 0.50    Years: 30.00    Pack years: 15.00    Types: Cigarettes  . Smokeless tobacco: Never Used  Substance and Sexual Activity  . Alcohol use: Yes    Alcohol/week: 0.0 oz    Comment: occassionally/ whiskey on weekend; total of 4 drinks per week  . Drug use: No  . Sexual activity: Not Currently  Lifestyle  . Physical activity:    Days per week: Not on file    Minutes per session: Not on file  . Stress: Not on file  Relationships  . Social  connections:    Talks on phone: Not on file    Gets together: Not on file    Attends religious service: Not on file    Active member of club or organization: Not on file    Attends meetings of clubs or organizations: Not on file    Relationship status: Not on file  . Intimate partner violence:    Fear of current or ex partner: Not on file    Emotionally abused: Not on file    Physically abused: Not on file    Forced sexual activity: Not on file  Other Topics Concern  . Not on file  Social History Narrative   From [place].   [place]- 20 hours week.    Husband in nursing home in [place].    1 child; 3 step children         Past Surgical History:  Procedure Laterality Date  . ABDOMINAL HYSTERECTOMY    . CARDIAC CATHETERIZATION  1995   normal, Dr Clayborn Bigness  . COLONOSCOPY  12/29/08  . COLONOSCOPY N/A 04/18/2015   Procedure: COLONOSCOPY;  Surgeon: Lucilla Lame, MD;  Location: [facility];  Service: Gastroenterology;  Laterality: N/A;  cecum time- 0957  . FOOT SURGERY    . KNEE ARTHROSCOPY Right 10/05/2015   Procedure: ARTHROSCOPY KNEE, partial lateral menisectomy;  Surgeon: Earnestine Leys, MD;  Location: [facility];  Service: Orthopedics;  Laterality: Right;  . LOWER EXTREMITY ANGIOGRAPHY Left 02/27/2018   Procedure: LOWER EXTREMITY ANGIOGRAPHY;  Surgeon: Algernon Huxley, MD;  Location: [facility];  Service: Cardiovascular;  Laterality: Left;  . POLYPECTOMY  04/18/2015   Procedure: POLYPECTOMY INTESTINAL;  Surgeon: Lucilla Lame, MD;  Location: [facility];  Service: Gastroenterology;;  . WRIST SURGERY     Family History  Problem Relation Age of Onset  . Diabetes Mother   . Hypertension Mother   . Diabetes Sister   . Hypertension Sister    Allergies  Allergen Reactions  . Aspirin Other (See Comments)    GI BLEED  . Nsaids Other (See Comments)    GI BLEED  . Pollen Extract Other (See Comments)    Watery eye and nasal stuffiness    . Penicillins Itching, Rash and Other (See Comments)    Has patient had a PCN reaction causing immediate rash, facial/tongue/throat swelling, SOB or lightheadedness with hypotension: Yes Has patient had a PCN reaction causing severe rash involving mucus membranes or skin necrosis: No Has patient had a PCN reaction that required hospitalization No Has patient had a PCN reaction occurring within the last 10 years: No If all  of the above answers are "NO", then may proceed with Cephalosporin use.       Assessment & Plan:  Patient presents for her first post procedure follow-up.  The patient is status post a left lower extremity angiogram with intervention on 02/27/2018 for atherosclerotic disease with rest pain.  The patient presents today with an improvement in her symptoms.  The patient's rest pain has now resolved.  The patient is experiencing some swelling to the left lower extremity.  The patient underwent a bilateral ABI which is notable for biphasic right tibials.  Triphasic left tibials.  Right ABI 0.53.  Left ABI 0.94.  When compared to the previous exam on February 25, 2018 there has been an increase to the left lower extremity arterial flow.  At this time, the patient denies any claudication-like symptoms, rest pain or ulceration to the right lower extremity.  The patient denies any fever, nausea or vomiting.  1. Atherosclerosis of native artery of left lower extremity with rest pain ([place]) - Stable Patient presents for her first post procedure follow-up. The patient is status post left lower extremity angiogram with intervention on February 27, 2018 Status post the patient's intervention her rest pain has now resolved.  There has been an increase to the left lower extremity arterial flow.  The patient has moderate disease to the right lower extremity however at this time she is not experiencing any symptoms. Patient is to follow-up in 6 months for an ABI and a left lower extremity arterial  duplex I have discussed with the patient at length the risk factors for and pathogenesis of atherosclerotic disease and encouraged a healthy diet, regular exercise regimen and blood pressure / glucose control.  The patient was encouraged to call the office in the interim if he experiences any claudication like symptoms, rest pain or ulcers to his feet / toes.  - VAS [place] ABI WITH/WO TBI; Future - VAS [place] LOWER EXTREMITY ARTERIAL DUPLEX; Future  2. Tobacco use disorder - Stable We had a discussion for approximately 5 minutes regarding the absolute need for smoking cessation due to the deleterious nature of tobacco on the vascular system. We discussed the tobacco use would diminish patency of any intervention, and likely significantly worsen progressio of disease. We discussed multiple agents for quitting including replacement therapy or medications to reduce cravings such as Chantix. The patient voices their understanding of the importance of smoking cessation.  3. Essential hypertension - Stable Encouraged good control as its slows the progression of atherosclerotic disease  Current Outpatient Medications on File Prior to Visit  Medication Sig Dispense Refill  . acetaminophen (TYLENOL) 500 MG tablet Take 1,000 mg by mouth daily.    Marland Kitchen amLODipine (NORVASC) 5 MG tablet Take 10 mg by mouth daily.     Marland Kitchen atorvastatin (LIPITOR) 10 MG tablet Take 1 tablet (10 mg total) by mouth daily. 30 tablet 11  . bisoprolol-hydrochlorothiazide (ZIAC) 10-6.25 MG tablet TAKE ONE (1) TABLET BY MOUTH EVERY DAY 90 tablet 3  . clopidogrel (PLAVIX) 75 MG tablet Take 1 tablet (75 mg total) by mouth daily. 30 tablet 11  . DiphenhydrAMINE HCl (ALKA-SELTZER PLUS ALLERGY PO) Take 1 tablet by mouth daily as needed (allergies).    Marland Kitchen levothyroxine (SYNTHROID, LEVOTHROID) 50 MCG tablet TAKE ONE (1) TABLET BY MOUTH EVERY DAY 90 tablet 3  . losartan (COZAAR) 100 MG tablet TAKE ONE (1) TABLET BY MOUTH EVERY DAY 90 tablet 3  . Multiple  Vitamins-Minerals (MULTI COMPLETE PO) Take 1 tablet  by mouth daily.     . pantoprazole (PROTONIX) 40 MG tablet TAKE ONE (1) TABLET EACH DAY 90 tablet 3   No current facility-administered medications on file prior to visit.    There are no Patient Instructions on file for this visit. No follow-ups on file.  Tymier Lindholm A Nadia Viar, PA-C

## 2018-04-15 ENCOUNTER — Ambulatory Visit (INDEPENDENT_AMBULATORY_CARE_PROVIDER_SITE_OTHER): Payer: Medicare HMO | Admitting: Vascular Surgery

## 2018-04-18 ENCOUNTER — Telehealth (INDEPENDENT_AMBULATORY_CARE_PROVIDER_SITE_OTHER): Payer: Self-pay

## 2018-04-18 NOTE — Telephone Encounter (Signed)
Patient called and stated that she received a message in her MyChart that she had new results available, but she can't see what the results says.  I told her that I would have to let the doctor or the P.A. Interpret the results, and then I can call her back and let her know what the next step in her care is going to be, since Epic doesn't allow the patient to see ultrasounds because information can be misinterpreted.

## 2018-04-18 NOTE — Telephone Encounter (Signed)
These are the same results we discussed during her Apr 09, 2018 appointment.

## 2018-05-23 ENCOUNTER — Telehealth: Payer: Self-pay | Admitting: Family

## 2018-05-23 DIAGNOSIS — Z760 Encounter for issue of repeat prescription: Secondary | ICD-10-CM

## 2018-05-23 MED ORDER — LEVOTHYROXINE SODIUM 50 MCG PO TABS: ORAL_TABLET | ORAL | 0 refills | Status: DC

## 2018-05-23 NOTE — Telephone Encounter (Signed)
Prescription previously sent to [facility]. Remaining refill sent to [facility] as requested by pt.

## 2018-05-23 NOTE — Telephone Encounter (Signed)
Copied from [place] [phone]. Topic: Quick Communication - See Telephone Encounter >> May 23, 2018  1:43 PM Vernona Rieger wrote: CRM for notification. See Telephone encounter for: 05/23/18.  [facility] needs [facility] new script for levothyroxine (SYNTHROID, LEVOTHROID) 50 MCG tablet. Please advise

## 2018-06-17 ENCOUNTER — Ambulatory Visit (INDEPENDENT_AMBULATORY_CARE_PROVIDER_SITE_OTHER): Payer: Medicare HMO | Admitting: Vascular Surgery

## 2018-06-17 ENCOUNTER — Encounter (INDEPENDENT_AMBULATORY_CARE_PROVIDER_SITE_OTHER): Payer: Self-pay | Admitting: Vascular Surgery

## 2018-06-17 VITALS — BP 147/86 | HR 67 | Resp 13 | Ht 65.0 in | Wt 159.0 lb

## 2018-06-17 DIAGNOSIS — I739 Peripheral vascular disease, unspecified: Secondary | ICD-10-CM | POA: Insufficient documentation

## 2018-06-17 DIAGNOSIS — F17200 Nicotine dependence, unspecified, uncomplicated: Secondary | ICD-10-CM

## 2018-06-17 DIAGNOSIS — E785 Hyperlipidemia, unspecified: Secondary | ICD-10-CM | POA: Diagnosis not present

## 2018-06-17 DIAGNOSIS — R69 Illness, unspecified: Secondary | ICD-10-CM | POA: Diagnosis not present

## 2018-06-17 DIAGNOSIS — I10 Essential (primary) hypertension: Secondary | ICD-10-CM

## 2018-06-17 NOTE — Progress Notes (Signed)
Subjective:    Patient ID: Lindsay [name], female    DOB: Aug 07, 1941, 77 y.o.   MRN: [id number] Chief Complaint  Patient presents with  . Follow-up    Foot numbness and cold feeling   Patient last seen on Apr 09, 2018 for her first post procedure follow-up. The patient is status post a left lower extremity angiogram with intervention on 02/27/2018 for atherosclerotic disease with rest pain.  During her last visit, the patient presented with an improvement in her symptoms.  The patient's rest pain had  resolved.  During her last visit, the patient underwent a bilateral ABI which is notable for biphasic right tibials.  Triphasic left tibials.  Right ABI 0.53.  Left ABI 0.94. When compared to the previous exam on February 25, 2018 there has been an increase to the left lower extremity arterial flow.  The patient presents today stating "continued tingling and numbness to the left foot".  Patient states that these symptoms never fully resolved.  Patient denies any claudication-like symptoms, rest pain or ulcer formation to the left lower extremity.  Patient states she feels her left foot is cooler when compared to the right.  Patient denies any fever, nausea vomiting.  Review of Systems  Constitutional: Negative.   HENT: Negative.   Eyes: Negative.   Respiratory: Negative.   Cardiovascular: Negative.   Gastrointestinal: Negative.   Endocrine: Negative.   Genitourinary: Negative.   Musculoskeletal: Negative.   Skin: Negative.   Allergic/Immunologic: Negative.   Neurological: Positive for numbness.  Hematological: Negative.   Psychiatric/Behavioral: Negative.       Objective:   Physical Exam  Constitutional: She is oriented to person, place, and time. She appears well-developed and well-nourished. No distress.  HENT:  Head: Normocephalic and atraumatic.  Right Ear: External ear normal.  Left Ear: External ear normal.  Eyes: Pupils are equal, round, and reactive to light. Conjunctivae and EOM  are normal.  Neck: Normal range of motion.  Cardiovascular: Normal rate, regular rhythm, normal heart sounds and intact distal pulses.  Pulses:      Radial pulses are 2+ on the right side, and 2+ on the left side.       Dorsalis pedis pulses are 1+ on the right side, and 1+ on the left side.       Posterior tibial pulses are 1+ on the right side, and 0 on the left side.  Pulmonary/Chest: Effort normal and breath sounds normal.  Musculoskeletal: Normal range of motion. She exhibits no edema.  Neurological: She is alert and oriented to person, place, and time.  Skin: Skin is warm and dry. She is not diaphoretic.  Bilateral feet are warm  Psychiatric: She has a normal mood and affect. Her behavior is normal. Judgment and thought content normal.  Vitals reviewed.  BP (!) 147/86 (BP Location: Left Arm, Patient Position: Sitting)   Pulse 67   Resp 13   Ht 5\' 5"  (1.651 m)   Wt 159 lb (72.1 kg)   BMI 26.46 kg/m   Past Medical History:  Diagnosis Date  . Anemia   . Anxiety   . Arthritis    osteo, back  . Depression   . Diverticulosis   . GERD (gastroesophageal reflux disease)   . Hematochezia   . Hypertension    controlled on meds  . Hypothyroidism    Social History   Socioeconomic History  . Marital status: Married    Spouse name: Not on file  . Number of  children: Not on file  . Years of education: Not on file  . Highest education level: Not on file  Occupational History  . Not on file  Social Needs  . Financial resource strain: Not on file  . Food insecurity:    Worry: Not on file    Inability: Not on file  . Transportation needs:    Medical: Not on file    Non-medical: Not on file  Tobacco Use  . Smoking status: Current Every Day Smoker    Packs/day: 0.50    Years: 30.00    Pack years: 15.00    Types: Cigarettes  . Smokeless tobacco: Never Used  Substance and Sexual Activity  . Alcohol use: Yes    Alcohol/week: 0.0 oz    Comment: occassionally/ whiskey on  weekend; total of 4 drinks per week  . Drug use: No  . Sexual activity: Not Currently  Lifestyle  . Physical activity:    Days per week: Not on file    Minutes per session: Not on file  . Stress: Not on file  Relationships  . Social connections:    Talks on phone: Not on file    Gets together: Not on file    Attends religious service: Not on file    Active member of club or organization: Not on file    Attends meetings of clubs or organizations: Not on file    Relationship status: Not on file  . Intimate partner violence:    Fear of current or ex partner: Not on file    Emotionally abused: Not on file    Physically abused: Not on file    Forced sexual activity: Not on file  Other Topics Concern  . Not on file  Social History Narrative   From [place].   [employer]- 20 hours week.    Husband in nursing home in [place].    1 child; 3 step children         Past Surgical History:  Procedure Laterality Date  . ABDOMINAL HYSTERECTOMY    . CARDIAC CATHETERIZATION  1995   normal, Dr Clayborn Bigness  . COLONOSCOPY  12/29/08  . COLONOSCOPY N/A 04/18/2015   Procedure: COLONOSCOPY;  Surgeon: Lucilla Lame, MD;  Location: [facility];  Service: Gastroenterology;  Laterality: N/A;  cecum time- 0957  . FOOT SURGERY    . KNEE ARTHROSCOPY Right 10/05/2015   Procedure: ARTHROSCOPY KNEE, partial lateral menisectomy;  Surgeon: Earnestine Leys, MD;  Location: [facility];  Service: Orthopedics;  Laterality: Right;  . LOWER EXTREMITY ANGIOGRAPHY Left 02/27/2018   Procedure: LOWER EXTREMITY ANGIOGRAPHY;  Surgeon: Algernon Huxley, MD;  Location: [facility];  Service: Cardiovascular;  Laterality: Left;  . POLYPECTOMY  04/18/2015   Procedure: POLYPECTOMY INTESTINAL;  Surgeon: Lucilla Lame, MD;  Location: [facility];  Service: Gastroenterology;;  . WRIST SURGERY     Family History  Problem Relation Age of Onset  . Diabetes Mother   . Hypertension Mother   .  Diabetes Sister   . Hypertension Sister    Allergies  Allergen Reactions  . Aspirin Other (See Comments)    GI BLEED  . Nsaids Other (See Comments)    GI BLEED  . Pollen Extract Other (See Comments)    Watery eye and nasal stuffiness  . Penicillins Itching, Rash and Other (See Comments)    Has patient had a PCN reaction causing immediate rash, facial/tongue/throat swelling, SOB or lightheadedness with hypotension: Yes Has  patient had a PCN reaction causing severe rash involving mucus membranes or skin necrosis: No Has patient had a PCN reaction that required hospitalization No Has patient had a PCN reaction occurring within the last 10 years: No If all of the above answers are "NO", then may proceed with Cephalosporin use.       Assessment & Plan:  Patient last seen on Apr 09, 2018 for her first post procedure follow-up. The patient is status post a left lower extremity angiogram with intervention on 02/27/2018 for atherosclerotic disease with rest pain.  During her last visit, the patient presented with an improvement in her symptoms.  The patient's rest pain had  resolved.  During her last visit, the patient underwent a bilateral ABI which is notable for biphasic right tibials.  Triphasic left tibials.  Right ABI 0.53.  Left ABI 0.94. When compared to the previous exam on February 25, 2018 there has been an increase to the left lower extremity arterial flow.  The patient presents today stating "continued tingling and numbness to the left foot".  Patient states that these symptoms never fully resolved.  Patient denies any claudication-like symptoms, rest pain or ulcer formation to the left lower extremity.  Patient states she feels her left foot is cooler when compared to the right.  Patient denies any fever, nausea vomiting.  1. PAD (peripheral artery disease) ([place]) - Stable Patient patient with a past medical history of left lower extremity angiogram with intervention Patient presents with  continued tingling and numbness to the left foot Hard to palpate pedal pulses to the left foot I will bring the patient back and have her undergo an ABI and left lower extremity arterial duplex to assess the patient's arterial status I have discussed with the patient at length the risk factors for and pathogenesis of atherosclerotic disease and encouraged a healthy diet, regular exercise regimen and blood pressure / glucose control.  The patient was encouraged to call the office in the interim if he experiences any claudication like symptoms, rest pain or ulcers to his feet / toes.  - VAS [place] ABI WITH/WO TBI; Future - VAS [place] LOWER EXTREMITY ARTERIAL DUPLEX; Future  2. Tobacco use disorder - Stable We had a discussion for approximately five minutes regarding the absolute need for smoking cessation due to the deleterious nature of tobacco on the vascular system. We discussed the tobacco use would diminish patency of any intervention, and likely significantly worsen progressio of disease. We discussed multiple agents for quitting including replacement therapy or medications to reduce cravings such as Chantix. The patient voices their understanding of the importance of smoking cessation.  3. Essential hypertension - Stable Encouraged good control as its slows the progression of atherosclerotic disease  4. Hyperlipidemia, unspecified hyperlipidemia type - Stable Encouraged good control as its slows the progression of atherosclerotic disease  Current Outpatient Medications on File Prior to Visit  Medication Sig Dispense Refill  . acetaminophen (TYLENOL) 500 MG tablet Take 1,000 mg by mouth daily.    Marland Kitchen amLODipine (NORVASC) 5 MG tablet Take 10 mg by mouth daily.     Marland Kitchen atorvastatin (LIPITOR) 10 MG tablet Take 1 tablet (10 mg total) by mouth daily. 30 tablet 11  . bisoprolol-hydrochlorothiazide (ZIAC) 10-6.25 MG tablet TAKE ONE (1) TABLET BY MOUTH EVERY DAY 90 tablet 3  . clopidogrel (PLAVIX) 75 MG  tablet Take 1 tablet (75 mg total) by mouth daily. 30 tablet 11  . DiphenhydrAMINE HCl (ALKA-SELTZER PLUS ALLERGY PO) Take 1  tablet by mouth daily as needed (allergies).    Marland Kitchen levothyroxine (SYNTHROID, LEVOTHROID) 50 MCG tablet TAKE ONE (1) TABLET BY MOUTH EVERY DAY 90 tablet 0  . losartan (COZAAR) 100 MG tablet TAKE ONE (1) TABLET BY MOUTH EVERY DAY 90 tablet 3  . Multiple Vitamins-Minerals (MULTI COMPLETE PO) Take 1 tablet by mouth daily.     . pantoprazole (PROTONIX) 40 MG tablet TAKE ONE (1) TABLET EACH DAY 90 tablet 3   No current facility-administered medications on file prior to visit.    There are no Patient Instructions on file for this visit. No follow-ups on file.  Cathryn Gallery A Keelie Zemanek, PA-C

## 2018-07-26 ENCOUNTER — Other Ambulatory Visit: Payer: Self-pay | Admitting: Family

## 2018-07-26 DIAGNOSIS — Z760 Encounter for issue of repeat prescription: Secondary | ICD-10-CM

## 2018-07-26 DIAGNOSIS — I10 Essential (primary) hypertension: Secondary | ICD-10-CM

## 2018-08-25 ENCOUNTER — Other Ambulatory Visit: Payer: Self-pay | Admitting: Family

## 2018-08-25 DIAGNOSIS — Z760 Encounter for issue of repeat prescription: Secondary | ICD-10-CM

## 2018-09-17 ENCOUNTER — Encounter (INDEPENDENT_AMBULATORY_CARE_PROVIDER_SITE_OTHER): Payer: Medicare HMO

## 2018-09-17 ENCOUNTER — Ambulatory Visit (INDEPENDENT_AMBULATORY_CARE_PROVIDER_SITE_OTHER): Payer: Medicare HMO | Admitting: Vascular Surgery

## 2018-09-26 DIAGNOSIS — H2513 Age-related nuclear cataract, bilateral: Secondary | ICD-10-CM | POA: Diagnosis not present

## 2018-09-26 DIAGNOSIS — H40003 Preglaucoma, unspecified, bilateral: Secondary | ICD-10-CM | POA: Diagnosis not present

## 2018-09-30 ENCOUNTER — Telehealth (INDEPENDENT_AMBULATORY_CARE_PROVIDER_SITE_OTHER): Payer: Self-pay

## 2018-09-30 NOTE — Telephone Encounter (Signed)
Patient called and stated that her feet and toes are numb and that a burning sensation is going up her leg, and she is already scheduled to be seen on November 22, but wants to know if it would be advised that her appointment be moved up?

## 2018-10-01 NOTE — Telephone Encounter (Signed)
Please call this patient and get her scheduled to come in to see Arna Medici or Maudie Mercury but she needs to be scheduled with a ABI before seeing them. Thanks.

## 2018-10-03 ENCOUNTER — Ambulatory Visit (INDEPENDENT_AMBULATORY_CARE_PROVIDER_SITE_OTHER): Payer: Medicare HMO

## 2018-10-03 ENCOUNTER — Encounter (INDEPENDENT_AMBULATORY_CARE_PROVIDER_SITE_OTHER): Payer: Self-pay | Admitting: Nurse Practitioner

## 2018-10-03 ENCOUNTER — Ambulatory Visit (INDEPENDENT_AMBULATORY_CARE_PROVIDER_SITE_OTHER): Payer: Medicare HMO | Admitting: Nurse Practitioner

## 2018-10-03 VITALS — BP 179/90 | HR 65 | Resp 16 | Ht 65.0 in | Wt 166.0 lb

## 2018-10-03 DIAGNOSIS — F17200 Nicotine dependence, unspecified, uncomplicated: Secondary | ICD-10-CM

## 2018-10-03 DIAGNOSIS — I739 Peripheral vascular disease, unspecified: Secondary | ICD-10-CM | POA: Diagnosis not present

## 2018-10-03 DIAGNOSIS — I10 Essential (primary) hypertension: Secondary | ICD-10-CM

## 2018-10-03 DIAGNOSIS — E785 Hyperlipidemia, unspecified: Secondary | ICD-10-CM | POA: Diagnosis not present

## 2018-10-03 DIAGNOSIS — R69 Illness, unspecified: Secondary | ICD-10-CM | POA: Diagnosis not present

## 2018-10-03 MED ORDER — GABAPENTIN 300 MG PO CAPS: 300.0000 mg | ORAL_CAPSULE | Freq: Every day | ORAL | 0 refills | Status: DC

## 2018-10-03 MED ORDER — ATORVASTATIN CALCIUM 10 MG PO TABS: 10.0000 mg | ORAL_TABLET | Freq: Every day | ORAL | 3 refills | Status: DC

## 2018-10-03 MED ORDER — CLOPIDOGREL BISULFATE 75 MG PO TABS: 75.0000 mg | ORAL_TABLET | Freq: Every day | ORAL | 3 refills | Status: DC

## 2018-10-08 ENCOUNTER — Encounter (INDEPENDENT_AMBULATORY_CARE_PROVIDER_SITE_OTHER): Payer: Self-pay | Admitting: Nurse Practitioner

## 2018-10-08 NOTE — Progress Notes (Signed)
Subjective:    Patient ID: Lindsay [name], female    DOB: May 27, 1941, 77 y.o.   MRN: [id number] Chief Complaint  Patient presents with  . Follow-up    27month ultrasound follow up    HPI  Lindsay [name] is a 77 y.o. female that presents today for follow-up of her peripheral arterial disease.  She endorses an increase in claudication-like symptoms.  She also has complaints of numbness of her third fourth and fifth digits.  This is been occurring for approximately the last 2 to 3 months.  She denies any new wounds or ulceration formation.  Notes recent intervention was on 02/27/2018 with a percutaneous transluminal angioplasty to the SFA and proximal popliteal artery.  There was subsequent stent placement to the left SFA.  There was further percutaneous transluminal angioplasty of the left tibioperoneal trunk as well as the posterior tibial artery.  Patient endorses continuing to smoke.  The patient denies any chest pain or shortness of breath.  She denies any fever, chills, nausea, vomiting.  Today she underwent a bilateral ABI study.  Her right ABI is 0.67, and her left is 0.98.  The previous ABI done on 04/09/2018, the right ABI is 0.53 and the left is 0.94.  The TBI is 0.39 on the right and 0.57 on the left today.  The previous TBI was 0.30 on the right and 0.73 on the left.  The left lower extremity has biphasic waveforms in the anterior tibial artery, with triphasic in the posterior tibial.  There are dampened toe waveforms.  The anterior tibial artery is biphasic and the posterior tibial is monophasic.  There also dampened waveforms of the right great toe.  Past Medical History:  Diagnosis Date  . Anemia   . Anxiety   . Arthritis    osteo, back  . Depression   . Diverticulosis   . GERD (gastroesophageal reflux disease)   . Hematochezia   . Hypertension    controlled on meds  . Hypothyroidism     Past Surgical History:  Procedure Laterality Date  . ABDOMINAL HYSTERECTOMY    .  CARDIAC CATHETERIZATION  1995   normal, Dr Clayborn Bigness  . COLONOSCOPY  12/29/08  . COLONOSCOPY N/A 04/18/2015   Procedure: COLONOSCOPY;  Surgeon: Lucilla Lame, MD;  Location: [facility];  Service: Gastroenterology;  Laterality: N/A;  cecum time- 0957  . FOOT SURGERY    . KNEE ARTHROSCOPY Right 10/05/2015   Procedure: ARTHROSCOPY KNEE, partial lateral menisectomy;  Surgeon: Earnestine Leys, MD;  Location: [facility];  Service: Orthopedics;  Laterality: Right;  . LOWER EXTREMITY ANGIOGRAPHY Left 02/27/2018   Procedure: LOWER EXTREMITY ANGIOGRAPHY;  Surgeon: Algernon Huxley, MD;  Location: [facility];  Service: Cardiovascular;  Laterality: Left;  . POLYPECTOMY  04/18/2015   Procedure: POLYPECTOMY INTESTINAL;  Surgeon: Lucilla Lame, MD;  Location: [facility];  Service: Gastroenterology;;  . WRIST SURGERY      Social History   Socioeconomic History  . Marital status: Married    Spouse name: Not on file  . Number of children: Not on file  . Years of education: Not on file  . Highest education level: Not on file  Occupational History  . Not on file  Social Needs  . Financial resource strain: Not on file  . Food insecurity:    Worry: Not on file    Inability: Not on file  . Transportation needs:    Medical: Not on file    Non-medical:  Not on file  Tobacco Use  . Smoking status: Current Every Day Smoker    Packs/day: 0.50    Years: 30.00    Pack years: 15.00    Types: Cigarettes  . Smokeless tobacco: Never Used  Substance and Sexual Activity  . Alcohol use: Yes    Alcohol/week: 0.0 standard drinks    Comment: occassionally/ whiskey on weekend; total of 4 drinks per week  . Drug use: No  . Sexual activity: Not Currently  Lifestyle  . Physical activity:    Days per week: Not on file    Minutes per session: Not on file  . Stress: Not on file  Relationships  . Social connections:    Talks on phone: Not on file    Gets together: Not on file    Attends religious  service: Not on file    Active member of club or organization: Not on file    Attends meetings of clubs or organizations: Not on file    Relationship status: Not on file  . Intimate partner violence:    Fear of current or ex partner: Not on file    Emotionally abused: Not on file    Physically abused: Not on file    Forced sexual activity: Not on file  Other Topics Concern  . Not on file  Social History Narrative   From [place].   [employer]- 20 hours week.    Husband in nursing home in [place].    1 child; 3 step children          Family History  Problem Relation Age of Onset  . Diabetes Mother   . Hypertension Mother   . Diabetes Sister   . Hypertension Sister     Allergies  Allergen Reactions  . Aspirin Other (See Comments)    GI BLEED  . Nsaids Other (See Comments)    GI BLEED  . Pollen Extract Other (See Comments)    Watery eye and nasal stuffiness  . Penicillins Itching, Rash and Other (See Comments)    Has patient had a PCN reaction causing immediate rash, facial/tongue/throat swelling, SOB or lightheadedness with hypotension: Yes Has patient had a PCN reaction causing severe rash involving mucus membranes or skin necrosis: No Has patient had a PCN reaction that required hospitalization No Has patient had a PCN reaction occurring within the last 10 years: No If all of the above answers are "NO", then may proceed with Cephalosporin use.      Review of Systems   Review of Systems: Negative Unless Checked Constitutional: [] Weight loss  [] Fever  [] Chills Cardiac: [] Chest pain   []  Atrial Fibrillation  [] Palpitations   [] Shortness of breath when laying flat   [] Shortness of breath with exertion. Vascular:  [x] Pain in legs with walking   [] Pain in legs with standing  [] History of DVT   [] Phlebitis   [] Swelling in legs   [] Varicose veins   [] Non-healing ulcers Pulmonary:   [] Uses home oxygen   [] Productive cough   [] Hemoptysis   [] Wheeze   [] COPD   [] Asthma Neurologic:  [] Dizziness   [] Seizures   [] History of stroke   [] History of TIA  [] Aphasia   [] Vissual changes   [] Weakness or numbness in arm   [x] Weakness or numbness in leg Musculoskeletal:   [] Joint swelling   [] Joint pain   [] Low back pain  []  History of Knee Replacement Hematologic:  [] Easy bruising  [] Easy bleeding   [] Hypercoagulable state   []   Anemic Gastrointestinal:  [] Diarrhea   [] Vomiting  [] Gastroesophageal reflux/heartburn   [] Difficulty swallowing. Genitourinary:  [] Chronic kidney disease   [] Difficult urination  [] Anuric   [] Blood in urine Skin:  [] Rashes   [] Ulcers  Psychological:  [x] History of anxiety   [x]  History of major depression  []  Memory Difficulties     Objective:   Physical Exam  BP (!) 179/90 (BP Location: Right Arm)   Pulse 65   Resp 16   Ht 5\' 5"  (1.651 m)   Wt 166 lb (75.3 kg)   BMI 27.62 kg/m   Gen: WD/WN, NAD Head: [place]/AT, No temporalis wasting.  Ear/Nose/Throat: Hearing grossly intact, nares w/o erythema or drainage Eyes: PER, EOMI, sclera nonicteric.  Neck: Supple, no masses.  No JVD.  Pulmonary:  Good air movement, no use of accessory muscles.  Cardiac: RRR Vascular:  Vessel Right Left  Radial Palpable Palpable  Dorsalis Pedis tracePalpable Trace Palpable  Posterior Tibial Not Palpable Not Palpable   Gastrointestinal: soft, non-distended. No guarding/no peritoneal signs.  Musculoskeletal: M/S 5/5 throughout.  No deformity or atrophy.  Neurologic: Pain and light touch intact in extremities.  Symmetrical.  Speech is fluent. Motor exam as listed above. Psychiatric: Judgment intact, Mood & affect appropriate for pt's clinical situation. Dermatologic: No Venous rashes. No Ulcers Noted.  No changes consistent with cellulitis. Lymph : No Cervical lymphadenopathy, no lichenification or skin changes of chronic lymphedema.      Assessment & Plan:   1. PAD (peripheral artery disease) ([place]) Today she underwent a bilateral ABI  study.  Her right ABI is 0.67, and her left is 0.98.  The previous ABI done on 04/09/2018, the right ABI is 0.53 and the left is 0.94.  The TBI is 0.39 on the right and 0.57 on the left today.  The previous TBI was 0.30 on the right and 0.73 on the left.  The left lower extremity has biphasic waveforms in the anterior tibial artery, with triphasic in the posterior tibial.  There are dampened toe waveforms.  The anterior tibial artery is biphasic and the posterior tibial is monophasic.  There also dampened waveforms of the right great toe.   The patient is currently hesitant to undergo any revascularization at this time.  Her main complaint is a burning aching pain in her 3rd, 4th and 5 th toes of her right lower extremity.  We will try some gabapentin to see if her pain has neurogenic components.  I had a long discussion with the patient regarding her smoking and how it increases the likelihood of requiring interventions in the future.  Also splint patient with a 90-day prescription for Plavix to update with her pharmacy as per her request.  We will follow-up with patient in 1 month to see if the gabapentin has helped her pain and numbness in her toes.  We will also see if revascularization of her right lower extremity is something she would like to consider.   - clopidogrel (PLAVIX) 75 MG tablet; Take 1 tablet (75 mg total) by mouth daily.  Dispense: 90 tablet; Refill: 3 - gabapentin (NEURONTIN) 300 MG capsule; Take 1 capsule (300 mg total) by mouth at bedtime.  Dispense: 30 capsule; Refill: 0  2. Tobacco use disorder I have advised Ms. plan to stop smoking as this will decrease the longevity of any intervention that we may do.  Discussed smoking cessation options with the patient.  Counseled for approximately 5 minutes.  3. Hyperlipidemia, unspecified hyperlipidemia type Continue statin as ordered  and reviewed, no changes at this time.  Patient requested 90-day prescriptions as opposed to the  monthly prescriptions that she was given on discharge.  I have given her new prescriptions to update with the pharmacy.  - atorvastatin (LIPITOR) 10 MG tablet; Take 1 tablet (10 mg total) by mouth daily.  Dispense: 90 tablet; Refill: 3  4. Essential hypertension Continue antihypertensive medications as already ordered, these medications have been reviewed and there are no changes at this time.    Current Outpatient Medications on File Prior to Visit  Medication Sig Dispense Refill  . acetaminophen (TYLENOL) 500 MG tablet Take 1,000 mg by mouth daily.    Marland Kitchen amLODipine (NORVASC) 5 MG tablet Take 10 mg by mouth daily.     . bisoprolol-hydrochlorothiazide (ZIAC) 10-6.25 MG tablet TAKE 1 TABLET BY MOUTH DAILY 30 tablet 0  . DiphenhydrAMINE HCl (ALKA-SELTZER PLUS ALLERGY PO) Take 1 tablet by mouth daily as needed (allergies).    Marland Kitchen levothyroxine (SYNTHROID, LEVOTHROID) 50 MCG tablet TAKE ONE (1) TABLET BY MOUTH EVERY DAY 90 tablet 0  . losartan (COZAAR) 100 MG tablet TAKE ONE (1) TABLET BY MOUTH EVERY DAY 90 tablet 3  . Multiple Vitamins-Minerals (MULTI COMPLETE PO) Take 1 tablet by mouth daily.     . pantoprazole (PROTONIX) 40 MG tablet TAKE ONE (1) TABLET EACH DAY 90 tablet 3   No current facility-administered medications on file prior to visit.     There are no Patient Instructions on file for this visit. No follow-ups on file.   Kris Hartmann, NP  This note was completed with Sales executive.  Any errors are purely unintentional.

## 2018-10-14 ENCOUNTER — Other Ambulatory Visit: Payer: Self-pay

## 2018-10-14 ENCOUNTER — Encounter: Payer: Self-pay | Admitting: Emergency Medicine

## 2018-10-14 ENCOUNTER — Other Ambulatory Visit (INDEPENDENT_AMBULATORY_CARE_PROVIDER_SITE_OTHER): Payer: Self-pay | Admitting: Nurse Practitioner

## 2018-10-14 ENCOUNTER — Telehealth (INDEPENDENT_AMBULATORY_CARE_PROVIDER_SITE_OTHER): Payer: Self-pay

## 2018-10-14 ENCOUNTER — Other Ambulatory Visit (INDEPENDENT_AMBULATORY_CARE_PROVIDER_SITE_OTHER): Payer: Self-pay | Admitting: Vascular Surgery

## 2018-10-14 ENCOUNTER — Inpatient Hospital Stay
Admission: EM | Admit: 2018-10-14 | Discharge: 2018-10-16 | DRG: 272 | Disposition: A | Discharge status: Home / Self Care | Payer: Medicare HMO | Attending: Internal Medicine | Admitting: Internal Medicine

## 2018-10-14 DIAGNOSIS — T82868A Thrombosis of vascular prosthetic devices, implants and grafts, initial encounter: Principal | ICD-10-CM | POA: Diagnosis present

## 2018-10-14 DIAGNOSIS — Z720 Tobacco use: Secondary | ICD-10-CM | POA: Diagnosis not present

## 2018-10-14 DIAGNOSIS — I10 Essential (primary) hypertension: Secondary | ICD-10-CM | POA: Diagnosis not present

## 2018-10-14 DIAGNOSIS — M1990 Unspecified osteoarthritis, unspecified site: Secondary | ICD-10-CM | POA: Diagnosis present

## 2018-10-14 DIAGNOSIS — I7090 Unspecified atherosclerosis: Secondary | ICD-10-CM | POA: Diagnosis not present

## 2018-10-14 DIAGNOSIS — Z91048 Other nonmedicinal substance allergy status: Secondary | ICD-10-CM

## 2018-10-14 DIAGNOSIS — Z79890 Hormone replacement therapy (postmenopausal): Secondary | ICD-10-CM | POA: Diagnosis not present

## 2018-10-14 DIAGNOSIS — E785 Hyperlipidemia, unspecified: Secondary | ICD-10-CM | POA: Diagnosis not present

## 2018-10-14 DIAGNOSIS — Z886 Allergy status to analgesic agent status: Secondary | ICD-10-CM | POA: Diagnosis not present

## 2018-10-14 DIAGNOSIS — Z79899 Other long term (current) drug therapy: Secondary | ICD-10-CM | POA: Diagnosis not present

## 2018-10-14 DIAGNOSIS — Y838 Other surgical procedures as the cause of abnormal reaction of the patient, or of later complication, without mention of misadventure at the time of the procedure: Secondary | ICD-10-CM | POA: Diagnosis not present

## 2018-10-14 DIAGNOSIS — Z7902 Long term (current) use of antithrombotics/antiplatelets: Secondary | ICD-10-CM | POA: Diagnosis not present

## 2018-10-14 DIAGNOSIS — Z86010 Personal history of colonic polyps: Secondary | ICD-10-CM

## 2018-10-14 DIAGNOSIS — D649 Anemia, unspecified: Secondary | ICD-10-CM | POA: Diagnosis present

## 2018-10-14 DIAGNOSIS — I739 Peripheral vascular disease, unspecified: Secondary | ICD-10-CM | POA: Diagnosis present

## 2018-10-14 DIAGNOSIS — I998 Other disorder of circulatory system: Secondary | ICD-10-CM | POA: Diagnosis not present

## 2018-10-14 DIAGNOSIS — R319 Hematuria, unspecified: Secondary | ICD-10-CM | POA: Diagnosis not present

## 2018-10-14 DIAGNOSIS — E039 Hypothyroidism, unspecified: Secondary | ICD-10-CM | POA: Diagnosis present

## 2018-10-14 DIAGNOSIS — F17210 Nicotine dependence, cigarettes, uncomplicated: Secondary | ICD-10-CM | POA: Diagnosis present

## 2018-10-14 DIAGNOSIS — K219 Gastro-esophageal reflux disease without esophagitis: Secondary | ICD-10-CM | POA: Diagnosis not present

## 2018-10-14 DIAGNOSIS — F419 Anxiety disorder, unspecified: Secondary | ICD-10-CM | POA: Diagnosis present

## 2018-10-14 DIAGNOSIS — F329 Major depressive disorder, single episode, unspecified: Secondary | ICD-10-CM | POA: Diagnosis present

## 2018-10-14 DIAGNOSIS — R69 Illness, unspecified: Secondary | ICD-10-CM | POA: Diagnosis not present

## 2018-10-14 DIAGNOSIS — M79672 Pain in left foot: Secondary | ICD-10-CM | POA: Diagnosis not present

## 2018-10-14 DIAGNOSIS — I743 Embolism and thrombosis of arteries of the lower extremities: Secondary | ICD-10-CM | POA: Diagnosis not present

## 2018-10-14 DIAGNOSIS — I70222 Atherosclerosis of native arteries of extremities with rest pain, left leg: Secondary | ICD-10-CM | POA: Diagnosis not present

## 2018-10-14 HISTORY — DX: Peripheral vascular disease, unspecified: I73.9

## 2018-10-14 LAB — COMPREHENSIVE METABOLIC PANEL
ALBUMIN: 4.4 g/dL (ref 3.5–5.0)
ALK PHOS: 47 U/L (ref 38–126)
ALT: 16 U/L (ref 0–44)
AST: 21 U/L (ref 15–41)
Anion gap: 6 (ref 5–15)
BILIRUBIN TOTAL: 0.3 mg/dL (ref 0.3–1.2)
BUN: 11 mg/dL (ref 8–23)
CO2: 28 mmol/L (ref 22–32)
CREATININE: 0.75 mg/dL (ref 0.44–1.00)
Calcium: 9.3 mg/dL (ref 8.9–10.3)
Chloride: 109 mmol/L (ref 98–111)
GFR calc Af Amer: >60 mL/min (ref >60)
GLUCOSE: 107 mg/dL — AB (ref 70–99)
Potassium: 4.1 mmol/L (ref 3.5–5.1)
Sodium: 143 mmol/L (ref 135–145)
TOTAL PROTEIN: 7.9 g/dL (ref 6.5–8.1)

## 2018-10-14 LAB — CBC
HEMATOCRIT: 40.9 % (ref 36.0–46.0)
HEMOGLOBIN: 14.4 g/dL (ref 12.0–15.0)
MCH: 30.3 pg (ref 26.0–34.0)
MCHC: 35.2 g/dL (ref 30.0–36.0)
MCV: 86.1 fL (ref 80.0–100.0)
NRBC: 0 % (ref 0.0–0.2)
Platelets: 244 10*3/uL (ref 150–400)
RBC: 4.75 MIL/uL (ref 3.87–5.11)
RDW: 13.2 % (ref 11.5–15.5)
WBC: 6.6 10*3/uL (ref 4.0–10.5)

## 2018-10-14 LAB — APTT: APTT: 28 s (ref 24–36)

## 2018-10-14 LAB — PROTIME-INR
INR: 0.97
Prothrombin Time: 12.8 seconds (ref 11.4–15.2)

## 2018-10-14 MED ORDER — BISOPROLOL-HYDROCHLOROTHIAZIDE 10-6.25 MG PO TABS
1.0000 | ORAL_TABLET | Freq: Every day | ORAL | Status: DC
  Administered 2018-10-15 – 2018-10-16 (×2): 1 via ORAL
  Filled 2018-10-14 (×2): qty 1

## 2018-10-14 MED ORDER — ATORVASTATIN CALCIUM 10 MG PO TABS
10.0000 mg | ORAL_TABLET | Freq: Every day | ORAL | Status: DC
  Administered 2018-10-15 – 2018-10-16 (×2): 10 mg via ORAL
  Filled 2018-10-14 (×2): qty 1

## 2018-10-14 MED ORDER — GABAPENTIN 300 MG PO CAPS
300.0000 mg | ORAL_CAPSULE | Freq: Every day | ORAL | Status: DC
  Administered 2018-10-14 – 2018-10-15 (×2): 300 mg via ORAL
  Filled 2018-10-14 (×2): qty 1

## 2018-10-14 MED ORDER — HYDRALAZINE HCL 20 MG/ML IJ SOLN
10.0000 mg | Freq: Four times a day (QID) | INTRAMUSCULAR | Status: DC | PRN
  Filled 2018-10-14: qty 1

## 2018-10-14 MED ORDER — ADULT MULTIVITAMIN W/MINERALS CH
ORAL_TABLET | Freq: Every day | ORAL | Status: DC
  Administered 2018-10-15 – 2018-10-16 (×2): 1 via ORAL
  Filled 2018-10-14 (×2): qty 1

## 2018-10-14 MED ORDER — OXYCODONE HCL 5 MG PO TABS: 5.0000 mg | ORAL_TABLET | ORAL | Status: DC | PRN

## 2018-10-14 MED ORDER — LEVOTHYROXINE SODIUM 50 MCG PO TABS
50.0000 ug | ORAL_TABLET | Freq: Every day | ORAL | Status: DC
  Administered 2018-10-15 – 2018-10-16 (×2): 50 ug via ORAL
  Filled 2018-10-14 (×2): qty 1

## 2018-10-14 MED ORDER — ACETAMINOPHEN 650 MG RE SUPP: 650.0000 mg | Freq: Four times a day (QID) | RECTAL | Status: DC | PRN

## 2018-10-14 MED ORDER — SODIUM CHLORIDE 0.9 % IV SOLN
INTRAVENOUS | Status: DC
  Administered 2018-10-14 – 2018-10-15 (×3): via INTRAVENOUS

## 2018-10-14 MED ORDER — PANTOPRAZOLE SODIUM 40 MG PO TBEC
40.0000 mg | DELAYED_RELEASE_TABLET | Freq: Every day | ORAL | Status: DC
  Administered 2018-10-15 – 2018-10-16 (×2): 40 mg via ORAL
  Filled 2018-10-14 (×2): qty 1

## 2018-10-14 MED ORDER — CLOPIDOGREL BISULFATE 75 MG PO TABS
75.0000 mg | ORAL_TABLET | Freq: Every day | ORAL | Status: DC
  Administered 2018-10-16: 75 mg via ORAL
  Filled 2018-10-14 (×2): qty 1

## 2018-10-14 MED ORDER — AMLODIPINE BESYLATE 10 MG PO TABS
10.0000 mg | ORAL_TABLET | Freq: Every day | ORAL | Status: DC
  Administered 2018-10-15 – 2018-10-16 (×2): 10 mg via ORAL
  Filled 2018-10-14 (×2): qty 1

## 2018-10-14 MED ORDER — ACETAMINOPHEN 325 MG PO TABS: 650.0000 mg | ORAL_TABLET | Freq: Four times a day (QID) | ORAL | Status: DC | PRN

## 2018-10-14 MED ORDER — HEPARIN (PORCINE) 25000 UT/250ML-% IV SOLN
1000.0000 [IU]/h | INTRAVENOUS | Status: DC
  Administered 2018-10-14: 1250 [IU]/h via INTRAVENOUS
  Administered 2018-10-15: 1150 [IU]/h via INTRAVENOUS
  Filled 2018-10-14 (×3): qty 250

## 2018-10-14 MED ORDER — LOSARTAN POTASSIUM 50 MG PO TABS
100.0000 mg | ORAL_TABLET | Freq: Every day | ORAL | Status: DC
  Administered 2018-10-15 – 2018-10-16 (×2): 100 mg via ORAL
  Filled 2018-10-14 (×2): qty 2

## 2018-10-14 MED ORDER — INFLUENZA VAC SPLIT HIGH-DOSE 0.5 ML IM SUSY
0.5000 mL | PREFILLED_SYRINGE | INTRAMUSCULAR | Status: DC
  Filled 2018-10-14: qty 0.5

## 2018-10-14 MED ORDER — SODIUM CHLORIDE 0.9 % IV SOLN: INTRAVENOUS | Status: DC

## 2018-10-14 MED ORDER — ONDANSETRON HCL 4 MG PO TABS: 4.0000 mg | ORAL_TABLET | Freq: Four times a day (QID) | ORAL | Status: DC | PRN

## 2018-10-14 MED ORDER — ONDANSETRON HCL 4 MG/2ML IJ SOLN: 4.0000 mg | Freq: Four times a day (QID) | INTRAMUSCULAR | Status: DC | PRN

## 2018-10-14 MED ORDER — CLINDAMYCIN PHOSPHATE 300 MG/50ML IV SOLN
300.0000 mg | Freq: Once | INTRAVENOUS | Status: AC
  Administered 2018-10-15: 300 mg via INTRAVENOUS
  Filled 2018-10-14: qty 50

## 2018-10-14 MED ORDER — NICOTINE 14 MG/24HR TD PT24
14.0000 mg | MEDICATED_PATCH | Freq: Every day | TRANSDERMAL | Status: DC
  Administered 2018-10-14 – 2018-10-15 (×2): 14 mg via TRANSDERMAL
  Filled 2018-10-14 (×2): qty 1

## 2018-10-14 MED ORDER — MORPHINE SULFATE (PF) 2 MG/ML IV SOLN: 2.0000 mg | INTRAVENOUS | Status: DC | PRN

## 2018-10-14 MED ORDER — HEPARIN BOLUS VIA INFUSION
4500.0000 [IU] | Freq: Once | INTRAVENOUS | Status: AC
  Administered 2018-10-14: 4500 [IU] via INTRAVENOUS
  Filled 2018-10-14: qty 4500

## 2018-10-14 NOTE — Telephone Encounter (Signed)
Patient called and stated that her leg and foot is cold and that the pain is shooting up to her upper thigh. Chart says that the patient is in the hospital now or recently?  Should the patient go to the ER or come into the office?

## 2018-10-14 NOTE — ED Notes (Signed)
Pharmacy contacted regarding heparin drip. Pharmacy to send medication

## 2018-10-14 NOTE — Progress Notes (Signed)
Attempted to call back for report. No response.

## 2018-10-14 NOTE — ED Notes (Signed)
Attempted to call report- unable to at this time ... 

## 2018-10-14 NOTE — Progress Notes (Signed)
ANTICOAGULATION CONSULT NOTE - Initial Consult  Pharmacy Consult for Heparin Indication: arterial occlusion  Allergies  Allergen Reactions  . Aspirin Other (See Comments)    GI BLEED  . Nsaids Other (See Comments)    GI BLEED  . Penicillins Itching, Rash and Other (See Comments)    Has patient had a PCN reaction causing immediate rash, facial/tongue/throat swelling, SOB or lightheadedness with hypotension: Yes Has patient had a PCN reaction causing severe rash involving mucus membranes or skin necrosis: No Has patient had a PCN reaction that required hospitalization No Has patient had a PCN reaction occurring within the last 10 years: No If all of the above answers are "NO", then may proceed with Cephalosporin use.   . Pollen Extract Other (See Comments)    Congestion, Eye Irritation    Patient Measurements: Height: 5\' 5"  (165.1 cm) Weight: 166 lb (75.3 kg) IBW/kg (Calculated) : 57 Heparin Dosing Weight: 75.3 kg  Vital Signs: Temp: 98.1 F (36.7 C) (11/12 1450) Temp Source: Oral (11/12 1450) BP: 196/103 (11/12 1450) Pulse Rate: 66 (11/12 1450)  Labs: No results for input(s): HGB, HCT, PLT, APTT, LABPROT, INR, HEPARINUNFRC, HEPRLOWMOCWT, CREATININE, CKTOTAL, CKMB, TROPONINI in the last 72 hours.  CrCl cannot be calculated (Patient's most recent lab result is older than the maximum 21 days allowed.).   Medical History: Past Medical History:  Diagnosis Date  . Anemia   . Anxiety   . Arthritis    osteo, back  . Depression   . Diverticulosis   . GERD (gastroesophageal reflux disease)   . Hematochezia   . Hypertension    controlled on meds  . Hypothyroidism     Medications:  Scheduled:  Infusions:  PRN:   Assessment: Patient has a hx of peripheral artery disease status post stent to the left lower extremity and presents to the emergency department for pain and coolness to left lower extremity.  According to the patient she was at work when she felt like her  left foot was feeling very cold.  States she ultimately went home but the monitor heated blanket, but states she continued to feel cold and developed pain and cramping in the foot and left calf. Team concern for arterial occlusion. Starting heparin per vascular surgery. Labs ordered. Start heparin after labs are drawn.  .  Goal of Therapy:  Heparin level 0.3-0.7 units/ml Monitor platelets by anticoagulation protocol: Yes   Plan:  Give 4500 units bolus x 1 Start heparin infusion at 1250 units/hr Check anti-Xa level in 8 hours and daily while on heparin Continue to monitor H&H and platelets  Johnell Comings Syed Zukas 10/14/2018,3:40 PM

## 2018-10-14 NOTE — ED Triage Notes (Signed)
Says she has pain left leg and wants to make sure she doesn't have a blood clot

## 2018-10-14 NOTE — ED Notes (Signed)
Pt c/o LLE pain that started while at work today around [place] pt has a hx of stent placement in the same extremity in the past.

## 2018-10-14 NOTE — ED Notes (Signed)
Pt resting comfortably, denies any pain at present. Per pt request, pt given coffee to drink. Will continue to monitor the pt.

## 2018-10-14 NOTE — ED Provider Notes (Signed)
[facility] Emergency Department Provider Note  Time seen: 3:22 PM  I have reviewed the triage vital signs and the nursing notes.   HISTORY  Chief Complaint DVT    HPI Lindsay [name] is a 77 y.o. female with a past medical history of anemia, anxiety, gastric reflux, hypertension, peripheral artery disease status post stent to the left lower extremity presents to the emergency department for pain and coolness to left lower extremity.  According to the patient she was at work when she felt like her left foot was feeling very cold.  States she ultimately went home but the monitor heated blanket, but states she continued to feel cold and developed pain and cramping in the foot and left calf.  Patient was concerned so she came to the emergency department for evaluation.  Patient states she has a history of a stent placed to left lower extremity by Dr.Dew, but had a follow-up appointment 2 weeks ago and was told that she has great blood flow through the stent.  Continue to state mild cramping in the left foot and left calf.   Past Medical History:  Diagnosis Date  . Anemia   . Anxiety   . Arthritis    osteo, back  . Depression   . Diverticulosis   . GERD (gastroesophageal reflux disease)   . Hematochezia   . Hypertension    controlled on meds  . Hypothyroidism     Patient Active Problem List   Diagnosis Date Noted  . PAD (peripheral artery disease) ([place]) 06/17/2018  . Hyperlipidemia 06/17/2018  . Atherosclerosis of native arteries of extremity with rest pain ([place]) 02/18/2018  . Alcohol use 02/14/2017  . Chest pain 02/14/2017  . Unstable angina ([place]) 02/09/2017  . Herpes zoster 07/12/2015  . Corneal abrasion, left 07/01/2015  . GI bleed 06/25/2015  . Calcific Achilles tendinitis 07/16/2014  . Right knee pain 07/07/2014  . Tobacco use disorder 03/18/2013  . Anxiety 09/10/2012  . Arthritis 09/10/2012  . Screening for breast cancer 09/10/2012  . Insomnia  02/13/2012  . Hypothyroidism 08/15/2011  . Hypertension 08/15/2011  . Depression 08/15/2011    Past Surgical History:  Procedure Laterality Date  . ABDOMINAL HYSTERECTOMY    . CARDIAC CATHETERIZATION  1995   normal, Dr Clayborn Bigness  . COLONOSCOPY  12/29/08  . COLONOSCOPY N/A 04/18/2015   Procedure: COLONOSCOPY;  Surgeon: Lucilla Lame, MD;  Location: [facility];  Service: Gastroenterology;  Laterality: N/A;  cecum time- 0957  . FOOT SURGERY    . KNEE ARTHROSCOPY Right 10/05/2015   Procedure: ARTHROSCOPY KNEE, partial lateral menisectomy;  Surgeon: Earnestine Leys, MD;  Location: [facility];  Service: Orthopedics;  Laterality: Right;  . LOWER EXTREMITY ANGIOGRAPHY Left 02/27/2018   Procedure: LOWER EXTREMITY ANGIOGRAPHY;  Surgeon: Algernon Huxley, MD;  Location: [facility];  Service: Cardiovascular;  Laterality: Left;  . POLYPECTOMY  04/18/2015   Procedure: POLYPECTOMY INTESTINAL;  Surgeon: Lucilla Lame, MD;  Location: [place];  Service: Gastroenterology;;  . WRIST SURGERY      Prior to Admission medications   Medication Sig Start Date End Date Taking? Authorizing Provider  acetaminophen (TYLENOL) 500 MG tablet Take 1,000 mg by mouth daily.    [provider]  amLODipine (NORVASC) 5 MG tablet Take 10 mg by mouth daily.     [provider]  atorvastatin (LIPITOR) 10 MG tablet Take 1 tablet (10 mg total) by mouth daily. 10/03/18 10/03/19  Kris Hartmann, NP  bisoprolol-hydrochlorothiazide (ZIAC) 10-6.25 MG tablet TAKE 1 TABLET BY MOUTH DAILY 08/25/18   Burnard Hawthorne, FNP  clopidogrel (PLAVIX) 75 MG tablet Take 1 tablet (75 mg total) by mouth daily. 10/03/18   Kris Hartmann, NP  DiphenhydrAMINE HCl (ALKA-SELTZER PLUS ALLERGY PO) Take 1 tablet by mouth daily as needed (allergies).    [provider]  gabapentin (NEURONTIN) 300 MG capsule Take 1 capsule (300 mg total) by mouth at bedtime. 10/03/18   Kris Hartmann, NP  levothyroxine (SYNTHROID,  LEVOTHROID) 50 MCG tablet TAKE ONE (1) TABLET BY MOUTH EVERY DAY 05/23/18   Burnard Hawthorne, FNP  losartan (COZAAR) 100 MG tablet TAKE ONE (1) TABLET BY MOUTH EVERY DAY 07/17/17   Burnard Hawthorne, FNP  Multiple Vitamins-Minerals (MULTI COMPLETE PO) Take 1 tablet by mouth daily.     [provider]  pantoprazole (PROTONIX) 40 MG tablet TAKE ONE (1) TABLET EACH DAY 07/17/17   Burnard Hawthorne, FNP    Allergies  Allergen Reactions  . Aspirin Other (See Comments)    GI BLEED  . Nsaids Other (See Comments)    GI BLEED  . Penicillins Itching, Rash and Other (See Comments)    Has patient had a PCN reaction causing immediate rash, facial/tongue/throat swelling, SOB or lightheadedness with hypotension: Yes Has patient had a PCN reaction causing severe rash involving mucus membranes or skin necrosis: No Has patient had a PCN reaction that required hospitalization No Has patient had a PCN reaction occurring within the last 10 years: No If all of the above answers are "NO", then may proceed with Cephalosporin use.   . Pollen Extract Other (See Comments)    Congestion, Eye Irritation    Family History  Problem Relation Age of Onset  . Diabetes Mother   . Hypertension Mother   . Diabetes Sister   . Hypertension Sister     Social History Social History   Tobacco Use  . Smoking status: Current Every Day Smoker    Packs/day: 0.50    Years: 30.00    Pack years: 15.00    Types: Cigarettes  . Smokeless tobacco: Never Used  Substance Use Topics  . Alcohol use: Yes    Alcohol/week: 0.0 standard drinks    Comment: occassionally/ whiskey on weekend; total of 4 drinks per week  . Drug use: No    Review of Systems Constitutional: Negative for fever. Cardiovascular: Negative for chest pain. Respiratory: Negative for shortness of breath. Gastrointestinal: Negative for abdominal pain Musculoskeletal: Pain to left foot left calf Skin: Left foot appears slightly more  pale. Neurological: Negative for headache All other ROS negative  ____________________________________________   PHYSICAL EXAM:  VITAL SIGNS: ED Triage Vitals  Enc Vitals Group     BP 10/14/18 1450 (!) 196/103     Pulse Rate 10/14/18 1450 66     Resp 10/14/18 1450 16     Temp 10/14/18 1450 98.1 F (36.7 C)     Temp Source 10/14/18 1450 Oral     SpO2 10/14/18 1450 99 %     Weight 10/14/18 1444 166 lb (75.3 kg)     Height 10/14/18 1444 5\' 5"  (1.651 m)     Head Circumference --      Peak Flow --      Pain Score 10/14/18 1444 8     Pain Loc --      Pain Edu? --      Excl. in [place]? --     Constitutional:  Alert and oriented. Well appearing and in no distress. Eyes: Normal exam ENT   Head: Normocephalic and atraumatic.   Mouth/Throat: Mucous membranes are moist. Cardiovascular: Normal rate, regular rhythm. No murmur Respiratory: Normal respiratory effort without tachypnea nor retractions. Breath sounds are clear Gastrointestinal: Soft and nontender. No distention.  Musculoskeletal: Patient states pain in the left foot and left calf, mild tenderness to palpation of the left calf, no tenderness in the popliteal fossa no proximal tenderness.  Left foot is slightly cool to the touch compared to the right and also slightly more pale compared to the right.  Unable to palpate pulses in the right or left foot we will obtain Doppler pulses. Neurologic:  Normal speech and language. No gross focal neurologic deficits  Skin: Skin is somewhat cool to touch in the left foot. Psychiatric: Mood and affect are normal.  ____________________________________________   INITIAL IMPRESSION / ASSESSMENT AND PLAN / ED COURSE  Pertinent labs & imaging results that were available during my care of the patient were reviewed by me and considered in my medical decision making (see chart for details).  Patient presents to the emergency department for left foot and calf pain and coolness.  On my  examination patient has slightly more pale left foot somewhat cool to the touch compared to the right foot, unable to palpate pulses in either foot, given her history of PAD with history of a stent, this would raise concern for arterial occlusion, more so than it would for DVT.  The leg proximally is normal in color and nontender.  Patient has sensation intact.  We will attempt to obtain Doppler pulses and discuss with vascular surgery.  Unable to Doppler pulses in left lower extremity, strong Doppler in the right lower extremity.  We will discuss with vascular surgery.  I discussed the patient with vascular surgery they recommend admission to the hospital starting on a heparin infusion.  They state they will likely perform an arteriogram tomorrow.  Patient agreeable to plan of care.  CRITICAL CARE Performed by: Harvest Dark   Total critical care time: 30 minutes  Critical care time was exclusive of separately billable procedures and treating other patients.  Critical care was necessary to treat or prevent imminent or life-threatening deterioration.  Critical care was time spent personally by me on the following activities: development of treatment plan with patient and/or surrogate as well as nursing, discussions with consultants, evaluation of patient's response to treatment, examination of patient, obtaining history from patient or surrogate, ordering and performing treatments and interventions, ordering and review of laboratory studies, ordering and review of radiographic studies, pulse oximetry and re-evaluation of patient's condition.  ____________________________________________   FINAL CLINICAL IMPRESSION(S) / ED DIAGNOSES  Left foot pain Arterial occlusion   Harvest Dark, MD 10/14/18 1537

## 2018-10-14 NOTE — Telephone Encounter (Signed)
Okay; thanks.

## 2018-10-14 NOTE — H&P (Signed)
[place] [facility] [place] NAME: Lindsay [name]    MR#:  [id number]  DATE OF BIRTH:  05-23-1941  DATE OF ADMISSION:  10/14/2018  PRIMARY CARE PHYSICIAN: Glendon Axe, MD   REQUESTING/REFERRING PHYSICIAN: Dr. Harvest Dark  CHIEF COMPLAINT:   Chief Complaint  Patient presents with  . DVT    HISTORY OF PRESENT ILLNESS:  Lindsay [name]  is a 77 y.o. female with a known history of hypertension, ongoing smoking, peripheral arterial disease status post left popliteal and SFA angioplasty in March 2019, arthritis, [name] Patient has seen vascular surgery for follow-up about a week ago for right foot 3 toes tingling and was started on gabapentin with improvement.  ABI done during that visit showed good pulses to the left foot.  Patient had prior angioplasty to the left leg and stent placement in March 2019.  Since this morning she has noticed increased pain to the left foot and also noted that the foot was cooler than normal.  She presented to the emergency room, unable to Doppler pulses in the left leg and is being admitted for claudication and need for angiogram again.  PAST MEDICAL HISTORY:   Past Medical History:  Diagnosis Date  . Anemia   . Anxiety   . Arthritis    osteo, back  . Depression   . Diverticulosis   . GERD (gastroesophageal reflux disease)   . Hematochezia   . Hypertension    controlled on meds  . Hypothyroidism   . PAD (peripheral artery disease) ([place])     PAST SURGICAL HISTORY:   Past Surgical History:  Procedure Laterality Date  . ABDOMINAL HYSTERECTOMY    . CARDIAC CATHETERIZATION  1995   normal, Dr Clayborn Bigness  . COLONOSCOPY  12/29/08  . COLONOSCOPY N/A 04/18/2015   Procedure: COLONOSCOPY;  Surgeon: Lucilla Lame, MD;  Location: [facility];  Service: Gastroenterology;  Laterality: N/A;  cecum time- 0957  . FOOT SURGERY    . KNEE  ARTHROSCOPY Right 10/05/2015   Procedure: ARTHROSCOPY KNEE, partial lateral menisectomy;  Surgeon: Earnestine Leys, MD;  Location: [facility];  Service: Orthopedics;  Laterality: Right;  . LOWER EXTREMITY ANGIOGRAPHY Left 02/27/2018   Procedure: LOWER EXTREMITY ANGIOGRAPHY;  Surgeon: Algernon Huxley, MD;  Location: [facility];  Service: Cardiovascular;  Laterality: Left;  . POLYPECTOMY  04/18/2015   Procedure: POLYPECTOMY INTESTINAL;  Surgeon: Lucilla Lame, MD;  Location: [facility];  Service: Gastroenterology;;  . WRIST SURGERY      SOCIAL HISTORY:   Social History   Tobacco Use  . Smoking status: Current Every Day Smoker    Packs/day: 0.50    Years: 30.00    Pack years: 15.00    Types: Cigarettes  . Smokeless tobacco: Never Used  Substance Use Topics  . Alcohol use: Yes    Alcohol/week: 0.0 standard drinks    Comment: occassionally/ whiskey on weekend; total of 4 drinks per week    FAMILY HISTORY:   Family History  Problem Relation Age of Onset  . Diabetes Mother   . Hypertension Mother   . Diabetes Sister   . Hypertension Sister     DRUG ALLERGIES:   Allergies  Allergen Reactions  . Aspirin Other (See Comments)    GI BLEED  . Nsaids Other (See Comments)    GI BLEED  . Penicillins Itching, Rash and Other (See Comments)  Has patient had a PCN reaction causing immediate rash, facial/tongue/throat swelling, SOB or lightheadedness with hypotension: Yes Has patient had a PCN reaction causing severe rash involving mucus membranes or skin necrosis: No Has patient had a PCN reaction that required hospitalization No Has patient had a PCN reaction occurring within the last 10 years: No If all of the above answers are "NO", then may proceed with Cephalosporin use.   . Pollen Extract Other (See Comments)    Congestion, Eye Irritation    REVIEW OF SYSTEMS:   Review of Systems  Constitutional: Negative for chills, fever, malaise/fatigue and weight loss.  HENT:  Negative for ear discharge, ear pain, hearing loss, nosebleeds and tinnitus.   Eyes: Negative for blurred vision, double vision and photophobia.  Respiratory: Negative for cough, hemoptysis, shortness of breath and wheezing.   Cardiovascular: Negative for chest pain, palpitations, orthopnea and leg swelling.  Gastrointestinal: Negative for abdominal pain, constipation, diarrhea, heartburn, melena, nausea and vomiting.  Genitourinary: Negative for dysuria, frequency, hematuria and urgency.  Musculoskeletal: Positive for myalgias. Negative for back pain and neck pain.  Skin: Negative for rash.  Neurological: Negative for dizziness, tingling, tremors, sensory change, speech change, focal weakness and headaches.  Endo/Heme/Allergies: Does not bruise/bleed easily.  Psychiatric/Behavioral: Negative for depression.    MEDICATIONS AT HOME:   Prior to Admission medications   Medication Sig Start Date End Date Taking? Authorizing Provider  acetaminophen (TYLENOL) 500 MG tablet Take 1,000 mg by mouth daily.   Yes [provider]  amLODipine (NORVASC) 5 MG tablet Take 10 mg by mouth daily.    Yes [provider]  atorvastatin (LIPITOR) 10 MG tablet Take 1 tablet (10 mg total) by mouth daily. 10/03/18 10/03/19 Yes Kris Hartmann, NP  bisoprolol-hydrochlorothiazide (ZIAC) 10-6.25 MG tablet TAKE 1 TABLET BY MOUTH DAILY Patient taking differently: Take 1 tablet by mouth daily.  08/25/18  Yes Burnard Hawthorne, FNP  clopidogrel (PLAVIX) 75 MG tablet Take 1 tablet (75 mg total) by mouth daily. 10/03/18  Yes Kris Hartmann, NP  DiphenhydrAMINE HCl (ALKA-SELTZER PLUS ALLERGY PO) Take 1 tablet by mouth daily as needed (allergies).   Yes [provider]  gabapentin (NEURONTIN) 300 MG capsule Take 1 capsule (300 mg total) by mouth at bedtime. 10/03/18  Yes Kris Hartmann, NP  levothyroxine (SYNTHROID, LEVOTHROID) 50 MCG tablet TAKE ONE (1) TABLET BY MOUTH EVERY DAY Patient taking  differently: Take 50 mcg by mouth daily.  05/23/18  Yes Arnett, Yvetta Coder, FNP  losartan (COZAAR) 100 MG tablet TAKE ONE (1) TABLET BY MOUTH EVERY DAY Patient taking differently: Take 100 mg by mouth daily.  07/17/17  Yes Arnett, Yvetta Coder, FNP  Multiple Vitamins-Minerals (MULTI COMPLETE PO) Take 1 tablet by mouth daily.    Yes [provider]  pantoprazole (PROTONIX) 40 MG tablet TAKE ONE (1) TABLET EACH DAY Patient taking differently: Take 40 mg by mouth daily.  07/17/17  Yes Arnett, Yvetta Coder, FNP      VITAL SIGNS:  Blood pressure (!) 196/103, pulse 66, temperature 98.1 F (36.7 C), temperature source Oral, resp. rate 16, height 5\' 5"  (1.651 m), weight 75.3 kg, SpO2 99 %.  PHYSICAL EXAMINATION:   Physical Exam  GENERAL:  77 y.o.-year-old patient lying in the bed with no acute distress.  EYES: Pupils equal, round, reactive to light and accommodation. No scleral icterus. Extraocular muscles intact.  HEENT: Head atraumatic, normocephalic. Oropharynx and nasopharynx clear.  NECK:  Supple, no jugular venous distention. No thyroid  enlargement, no tenderness.  LUNGS: Normal breath sounds bilaterally, no wheezing, rales,rhonchi or crepitation. No use of accessory muscles of respiration.  CARDIOVASCULAR: S1, S2 normal. No murmurs, rubs, or gallops.  ABDOMEN: Soft, nontender, nondistended. Bowel sounds present. No organomegaly or mass.  EXTREMITIES: Cool to touch left foot and unable to palpate DP or popliteal pulses.  Normal warmth of the right foot with palpable pulses.  No pedal edema, cyanosis, or clubbing.  NEUROLOGIC: Cranial nerves II through XII are intact. Muscle strength 5/5 in all extremities. Sensation intact. Gait not checked.  PSYCHIATRIC: The patient is alert and oriented x 3.  SKIN: No obvious rash, lesion, or ulcer.   LABORATORY PANEL:   CBC Recent Labs  Lab 10/14/18 1542  WBC 6.6  HGB 14.4  HCT 40.9  PLT 244    ------------------------------------------------------------------------------------------------------------------  Chemistries  Recent Labs  Lab 10/14/18 1542  NA 143  K 4.1  CL 109  CO2 28  GLUCOSE 107*  BUN 11  CREATININE 0.75  CALCIUM 9.3  AST 21  ALT 16  ALKPHOS 47  BILITOT 0.3   ------------------------------------------------------------------------------------------------------------------  Cardiac Enzymes No results for input(s): TROPONINI in the last 168 hours. ------------------------------------------------------------------------------------------------------------------  RADIOLOGY:  No results found.  EKG:   Orders placed or performed during the hospital encounter of 02/09/17  . EKG 12-Lead  . EKG 12-Lead  . ED EKG within 10 minutes  . ED EKG within 10 minutes  . EKG    IMPRESSION AND PLAN:   Hedi Barkan  is a 77 y.o. female with a known history of hypertension, ongoing smoking, peripheral arterial disease status post left popliteal and SFA angioplasty in March 2019, arthritis, [name]  1.  Peripheral arterial disease with claudication to left foot-previous angioplasty to SFA and proximal popliteal artery and SFA stent placement. -Admit, IV heparin drip -N.p.o. after midnight for possible angiogram again tomorrow -Vascular consult  2.  Hypertension-IV hydralazine PRN.  Also on home medications-Norvasc, bisoprolol and hydrochlorothiazide  3.  Hypothyroidism-Synthroid  4.  DVT prophylaxis-currently on heparin drip    All the records are reviewed and case discussed with ED provider. Management plans discussed with the patient, family and they are in agreement.  CODE STATUS: Full code  TOTAL TIME TAKING CARE OF THIS PATIENT: 51 minutes.    Gladstone Lighter M.D on 10/14/2018 at 4:35 PM  Between 7am to 6pm - Pager - [phone]  After 6pm go to [email or web address] -  password [email or web address] [facility]  Office  [phone]  CC: Primary care physician; Glendon Axe, MD

## 2018-10-14 NOTE — Consult Note (Signed)
[facility] Vascular Consult Note  MRN : [id number]  Lindsay [name] is a 77 y.o. (01/05/1941) female who presents with chief complaint of  Chief Complaint  Patient presents with  . DVT   History of Present Illness:  The patient is a 77 year old female with a past medical history of anemia, anxiety, arthritis, depression, diverticulosis, GERD, hypertension, hypothyroidism, peripheral artery disease s/p a recent endovascular intervention on February 27, 2018.  During that intervention, the patient underwent an ultrasound guidance for vascular access right femoral artery, Catheter placement into left anterior tibial artery and left posterior tibial arteries from right femoral approach, Aortogram and selective left lower extremity angiogram, Percutaneous transluminal angioplasty of left anterior tibial artery with 2 inflations one proximal and one distal with a 2.5 mm diameter by 15 cm length angioplasty balloon, Percutaneous transluminal angioplasty of the left SFA and proximal popliteal artery with 5 mm diameter by 22 cm length Lutonix drug-coated angioplasty balloon, Viabahn covered stent placement x2 to the left SFA and proximal popliteal artery with a 6 millimeter by 10 cm in length and a 6 mm x 5 cm length stent, Catheter directed thrombolytic therapy to the left tibioperoneal trunk and proximal posterior tibial artery with 4 mg of TPA, Mechanical thrombectomy with the penumbra cat 6 device to the left tibioperoneal trunk and proximal posterior tibial artery, Percutaneous transluminal angioplasty of the left tibioperoneal trunk and proximal posterior tibial artery with 3 mm diameter by 15 cm length angioplasty balloon with StarClose closure device right femoral artery.  Patient states that she has been taking Plavix on a daily basis.  Patient is allergic to aspirin.  Patient does take a statin on a daily basis.  The patient was in her normal state of health denying any  claudication-like symptoms, rest pain or ulcer formation to the bilateral lower extremity until this morning when she began to experience progressively worsening left foot pain.  The patient noted as the morning progressed her left foot pain started to radiate upward towards her leg.  The patient's discomfort progressively worsened throughout the morning which prompted her to seek medical attention at [facility] emergency department.  Patient denies any recent surgery or trauma to the left lower extremity.  Patient denies any discoloration to the left leg.  Patient denies any deficiencies in motor / sensory abilities.  Patient denies any increasing edema to the bilateral lower legs.  Denies any chest pain or shortness of breath.  Patient denies any fever, nausea vomiting.  Patient was recently seen in our office on October 03, 2018 and underwent an ABI which was notable for: Right: 0.67 Left: 0.98 The patient also underwent a left lower extremity arterial duplex which was notable for triphasic blood flow through the common femoral, deep femoral artery, superficial femoral artery transitioning to biphasic at the stent/mid/distal SFA, transitioning back to triphasic at the popliteal artery, monophasic anterior tibial and biphasic posterior tibial and peroneal arteries.  The patient's physical exam obtained by the emergency room was notable for nonpalpable pulses to the left foot, slightly pale in color, slightly cool to the touch however the exam was nontender to palpation with motor/sensory intact.  The patient was admitted to medicine and a heparin drip was started.  Vascular surgery was consulted by Dr. Tressia Miners for further recommendations possible angiogram.  Current Facility-Administered Medications  Medication Dose Route Frequency Provider Last Rate Last Dose  . heparin ADULT infusion 100 units/mL (25000 units/242mL sodium chloride 0.45%)  1,250  Units/hr Intravenous  Continuous Oswald Hillock, RPH      . heparin bolus via infusion 4,500 Units  4,500 Units Intravenous Once Oswald Hillock, [facility]       Current Outpatient Medications  Medication Sig Dispense Refill  . acetaminophen (TYLENOL) 500 MG tablet Take 1,000 mg by mouth daily.    Marland Kitchen amLODipine (NORVASC) 5 MG tablet Take 10 mg by mouth daily.     Marland Kitchen atorvastatin (LIPITOR) 10 MG tablet Take 1 tablet (10 mg total) by mouth daily. 90 tablet 3  . bisoprolol-hydrochlorothiazide (ZIAC) 10-6.25 MG tablet TAKE 1 TABLET BY MOUTH DAILY 30 tablet 0  . clopidogrel (PLAVIX) 75 MG tablet Take 1 tablet (75 mg total) by mouth daily. 90 tablet 3  . DiphenhydrAMINE HCl (ALKA-SELTZER PLUS ALLERGY PO) Take 1 tablet by mouth daily as needed (allergies).    . gabapentin (NEURONTIN) 300 MG capsule Take 1 capsule (300 mg total) by mouth at bedtime. 30 capsule 0  . levothyroxine (SYNTHROID, LEVOTHROID) 50 MCG tablet TAKE ONE (1) TABLET BY MOUTH EVERY DAY 90 tablet 0  . losartan (COZAAR) 100 MG tablet TAKE ONE (1) TABLET BY MOUTH EVERY DAY 90 tablet 3  . Multiple Vitamins-Minerals (MULTI COMPLETE PO) Take 1 tablet by mouth daily.     . pantoprazole (PROTONIX) 40 MG tablet TAKE ONE (1) TABLET EACH DAY 90 tablet 3   Past Medical History:  Diagnosis Date  . Anemia   . Anxiety   . Arthritis    osteo, back  . Depression   . Diverticulosis   . GERD (gastroesophageal reflux disease)   . Hematochezia   . Hypertension    controlled on meds  . Hypothyroidism   . PAD (peripheral artery disease) ([place])    Past Surgical History:  Procedure Laterality Date  . ABDOMINAL HYSTERECTOMY    . CARDIAC CATHETERIZATION  1995   normal, Dr Clayborn Bigness  . COLONOSCOPY  12/29/08  . COLONOSCOPY N/A 04/18/2015   Procedure: COLONOSCOPY;  Surgeon: Lucilla Lame, MD;  Location: [place];  Service: Gastroenterology;  Laterality: N/A;  cecum time- 0957  . FOOT SURGERY    . KNEE ARTHROSCOPY Right 10/05/2015   Procedure: ARTHROSCOPY KNEE, partial  lateral menisectomy;  Surgeon: Earnestine Leys, MD;  Location: [facility];  Service: Orthopedics;  Laterality: Right;  . LOWER EXTREMITY ANGIOGRAPHY Left 02/27/2018   Procedure: LOWER EXTREMITY ANGIOGRAPHY;  Surgeon: Algernon Huxley, MD;  Location: [place] [facility];  Service: Cardiovascular;  Laterality: Left;  . POLYPECTOMY  04/18/2015   Procedure: POLYPECTOMY INTESTINAL;  Surgeon: Lucilla Lame, MD;  Location: [place];  Service: Gastroenterology;;  . WRIST SURGERY     Social History Social History   Tobacco Use  . Smoking status: Current Every Day Smoker    Packs/day: 0.50    Years: 30.00    Pack years: 15.00    Types: Cigarettes  . Smokeless tobacco: Never Used  Substance Use Topics  . Alcohol use: Yes    Alcohol/week: 0.0 standard drinks    Comment: occassionally/ whiskey on weekend; total of 4 drinks per week  . Drug use: No   Family History Family History  Problem Relation Age of Onset  . Diabetes Mother   . Hypertension Mother   . Diabetes Sister   . Hypertension Sister   Patient denies any family history of peripheral artery disease, venous disease or renal disease.  Allergies  Allergen Reactions  . Aspirin Other (See Comments)    GI BLEED  .  Nsaids Other (See Comments)    GI BLEED  . Penicillins Itching, Rash and Other (See Comments)    Has patient had a PCN reaction causing immediate rash, facial/tongue/throat swelling, SOB or lightheadedness with hypotension: Yes Has patient had a PCN reaction causing severe rash involving mucus membranes or skin necrosis: No Has patient had a PCN reaction that required hospitalization No Has patient had a PCN reaction occurring within the last 10 years: No If all of the above answers are "NO", then may proceed with Cephalosporin use.   . Pollen Extract Other (See Comments)    Congestion, Eye Irritation   REVIEW OF SYSTEMS (Negative unless checked)  Constitutional: [] Weight loss  [] Fever  [] Chills Cardiac: [] Chest  pain   [] Chest pressure   [] Palpitations   [] Shortness of breath when laying flat   [] Shortness of breath at rest   [] Shortness of breath with exertion. Vascular:  [x] Pain in legs with walking   [] Pain in legs at rest   [] Pain in legs when laying flat   [x] Claudication   [x] Pain in feet when walking  [] Pain in feet at rest  [] Pain in feet when laying flat   [] History of DVT   [] Phlebitis   [] Swelling in legs   [] Varicose veins   [] Non-healing ulcers Pulmonary:   [] Uses home oxygen   [] Productive cough   [] Hemoptysis   [] Wheeze  [] COPD   [] Asthma Neurologic:  [] Dizziness  [] Blackouts   [] Seizures   [] History of stroke   [] History of TIA  [] Aphasia   [] Temporary blindness   [] Dysphagia   [] Weakness or numbness in arms   [] Weakness or numbness in legs Musculoskeletal:  [] Arthritis   [] Joint swelling   [] Joint pain   [] Low back pain Hematologic:  [] Easy bruising  [] Easy bleeding   [] Hypercoagulable state   [] Anemic  [] Hepatitis Gastrointestinal:  [] Blood in stool   [] Vomiting blood  [] Gastroesophageal reflux/heartburn   [] Difficulty swallowing. Genitourinary:  [] Chronic kidney disease   [] Difficult urination  [] Frequent urination  [] Burning with urination   [] Blood in urine Skin:  [] Rashes   [] Ulcers   [] Wounds Psychological:  [] History of anxiety   []  History of major depression.  Physical Examination  Vitals:   10/14/18 1444 10/14/18 1450  BP:  (!) 196/103  Pulse:  66  Resp:  16  Temp:  98.1 F (36.7 C)  TempSrc:  Oral  SpO2:  99%  Weight: 75.3 kg   Height: 5\' 5"  (1.651 m)    Body mass index is 27.62 kg/m. Gen:  WD/WN, NAD Head: [place]/AT, No temporalis wasting. Prominent temp pulse not noted. Ear/Nose/Throat: Hearing grossly intact, nares w/o erythema or drainage, oropharynx w/o Erythema/Exudate Eyes: Sclera non-icteric, conjunctiva clear Neck: Trachea midline.  No JVD.  Pulmonary:  Good air movement, respirations not labored, equal bilaterally.  Cardiac: RRR, normal S1, S2. Vascular:   Vessel Right Left  Radial Palpable Palpable  Ulnar Palpable Palpable  Brachial Palpable Palpable  Carotid Palpable, without bruit Palpable, without bruit  Aorta Not palpable N/A  Femoral Palpable Palpable  Popliteal Palpable Palpable  PT Palpable Non-Palpable  DP Palpable Non-Palpable   Left Lower Extremity: Thigh soft, calf soft. Non-tender to palpation. Slightly cooler in temperature when compared to right. No ulceration or mottling. Motor / sensory is intact.   Gastrointestinal: soft, non-tender/non-distended. No guarding/reflex.  Musculoskeletal: M/S 5/5 throughout.  Extremities without ischemic changes.  No deformity or atrophy. No edema. Neurologic: Sensation grossly intact in extremities.  Symmetrical.  Speech is fluent. Motor exam as  listed above. Psychiatric: Judgment intact, Mood & affect appropriate for pt's clinical situation. Dermatologic: No rashes or ulcers noted.  No cellulitis or open wounds. Lymph : No Cervical, Axillary, or Inguinal lymphadenopathy.  CBC Lab Results  Component Value Date   WBC PENDING 10/14/2018   HGB 14.4 10/14/2018   HCT 40.9 10/14/2018   MCV 86.1 10/14/2018   PLT 244 10/14/2018   BMET    Component Value Date/Time   NA 143 10/14/2018 1542   NA 145 03/20/2015 0500   K 4.1 10/14/2018 1542   K 3.9 03/20/2015 0500   CL 109 10/14/2018 1542   CL 117 (H) 03/20/2015 0500   CO2 28 10/14/2018 1542   CO2 27 03/20/2015 0500   GLUCOSE 107 (H) 10/14/2018 1542   GLUCOSE 122 (H) 03/20/2015 0500   BUN 11 10/14/2018 1542   BUN 11 03/20/2015 0500   CREATININE 0.75 10/14/2018 1542   CREATININE 0.63 03/20/2015 0500   CALCIUM 9.3 10/14/2018 1542   CALCIUM 7.8 (L) 03/20/2015 0500   GFRNONAA >60 10/14/2018 1542   GFRNONAA >60 03/20/2015 0500   GFRAA >60 10/14/2018 1542   GFRAA >60 03/20/2015 0500   Estimated Creatinine Clearance: 61.7 mL/min (by C-G formula based on SCr of 0.75 mg/dL).  COAG Lab Results  Component Value Date   INR 0.97  10/14/2018   INR 0.93 02/09/2017   Radiology Vas [place] Burnard Bunting With/wo Tbi  Result Date: 10/07/2018 LOWER EXTREMITY DOPPLER STUDY Indications: Peripheral artery disease, and Peripheral Vascular Disease.  Vascular Interventions: On 02/27/2018 PTA of the left ATA angioplasty/balloon.                         PTA left SFA & proximal popliteal artery with                         angioplasty/balloon. Stent placment to the left SFA *                         proximal popliteal artery. Thrombolytic therapy of the                         left tiobperoneal trunk and poximal posterior tibial                         artery. Mechanical thrombectomy left tibioperoneal trunk                         and pox posterior tibial artery. PTA left tibioperoneal                         trunk and pox posterior tibial artery                         angioplasty/balloon. Performing Technologist: Almira Coaster RVS  Examination Guidelines: A complete evaluation includes at minimum, Doppler waveform signals and systolic blood pressure reading at the level of bilateral brachial, anterior tibial, and posterior tibial arteries, when vessel segments are accessible. Bilateral testing is considered an integral part of a complete examination. Photoelectric Plethysmograph (PPG) waveforms and toe systolic pressure readings are included as required and additional duplex testing as needed. Limited examinations for reoccurring indications may be performed as noted.  ABI Findings: +--------+------------------+-----+--------+--------+ Right   Rt Pressure (  mmHg)IndexWaveformComment  +--------+------------------+-----+--------+--------+ [id number]                                     +--------+------------------+-----+--------+--------+ ATA     101               0.58                  +--------+------------------+-----+--------+--------+ PTA     118               0.67                  +--------+------------------+-----+--------+--------+  +--------+------------------+-----+--------+-------+ Left    Lt Pressure (mmHg)IndexWaveformComment +--------+------------------+-----+--------+-------+ [id number]                                    +--------+------------------+-----+--------+-------+ ATA     171               0.98                 +--------+------------------+-----+--------+-------+ PTA     159               0.91                 +--------+------------------+-----+--------+-------+ +-------+-----------+-----------+------------+------------+ ABI/TBIToday's ABIToday's TBIPrevious ABIPrevious TBI +-------+-----------+-----------+------------+------------+ Right  .67        .39        .53         .30          +-------+-----------+-----------+------------+------------+ Left   .98        .57        .94         .73          +-------+-----------+-----------+------------+------------+ Bilateral ABIs appear increased compared to prior study on 04/09/18. Left TBIs appear decreased compared to prior study on 04/09/18.  Summary: Right: Resting right ankle-brachial index indicates moderate right lower extremity arterial disease. The right toe-brachial index is abnormal. Left: Resting left ankle-brachial index is within normal range. No evidence of significant left lower extremity arterial disease. The left toe-brachial index is abnormal.  *See table(s) above for measurements and observations.  Electronically signed by Leotis Pain MD on 10/07/2018 at 9:05:18 AM.    Final    Vas [place] Lower Extremity Arterial Duplex  Result Date: 10/07/2018 LOWER EXTREMITY ARTERIAL DUPLEX STUDY Indications: Peripheral artery disease.  Current ABI: Rt .67 Lt .98 Performing Technologist: Almira Coaster RVS  Examination Guidelines: A complete evaluation includes B-mode imaging, spectral Doppler, color Doppler, and power Doppler as needed of all accessible portions of each vessel. Bilateral testing is considered an integral part of a complete  examination. Limited examinations for reoccurring indications may be performed as noted.  Left Duplex Findings: +-----------+--------+-----+--------+----------+--------------+            PSV cm/sRatioStenosisWaveform  Comments       +-----------+--------+-----+--------+----------+--------------+ CFA Prox   124     0            triphasic                +-----------+--------+-----+--------+----------+--------------+ DFA        89      0            triphasic                +-----------+--------+-----+--------+----------+--------------+  SFA Prox   74      0            triphasic                +-----------+--------+-----+--------+----------+--------------+ SFA Mid    110     0            biphasic  Proximal stent +-----------+--------+-----+--------+----------+--------------+ SFA Distal 47      0            biphasic  distal stent   +-----------+--------+-----+--------+----------+--------------+ POP Prox   77      0            triphasic                +-----------+--------+-----+--------+----------+--------------+ ATA Distal 25      0            monophasicreversed flow  +-----------+--------+-----+--------+----------+--------------+ PTA Distal 39      0            biphasic                 +-----------+--------+-----+--------+----------+--------------+ PERO Distal126     0            biphasic                 +-----------+--------+-----+--------+----------+--------------+  Summary: Left: Patent stent with no evidence of stenosis in the SFA Mid/Distal segment artery.  See table(s) above for measurements and observations. Electronically signed by Leotis Pain MD on 10/07/2018 at 9:05:21 AM.    Final    Assessment/Plan The patient is a 77 year old female with a past medical history of anemia, anxiety, arthritis, depression, diverticulosis, GERD, hypertension, hypothyroidism, hyperlipidemia, peripheral artery disease s/p a recent endovascular intervention on  February 27, 2018 to the left lower extremity including angioplasty and stent placement.  Patient presents to the emergency department with progressively worsening left lower extremity pain - stable 1.  Peripheral artery disease: Patient with a known history of peripheral artery disease status post a recent endovascular intervention to the left lower extremity on February 27, 2018.  Patient has been taking her Plavix on a daily basis.  Patient reports experiencing progressively worsening left lower extremity pain and discomfort starting this a.m.  Her discomfort progressed to the point where she sought medical attention at [facility] ED. physical exam is positive for nonpalpable pedal pulses to the left foot, left foot is cooler in temperature when compared to the right, left foot is slightly paler compared to the right.  Motor/sensory is intact.  Patient is relatively nontender to palpation.  There are no signs of acute ischemia however recommend the patient be admitted and started on heparin drip.  Plan is for a left lower extremity angiogram with possible intervention with Dr. Lucky Cowboy tomorrow.  Procedure, risks and benefits explained to the patient.  All questions answered.  The patient wishes to proceed. 2. Tobacco Abuse: We had a discussion for approximately three minutes regarding the absolute need for smoking cessation due to the deleterious nature of tobacco on the vascular system. We discussed the tobacco use would diminish patency of any intervention, and likely significantly worsen progressio of disease. We discussed multiple agents for quitting including replacement therapy or medications to reduce cravings such as Chantix. The patient voices their understanding of the importance of smoking cessation. 3. Hyperlipidemia: The patient is allergic to aspirin.  The patient does report she was taking her Plavix and Lipitor on a  daily basis. Encouraged good control as its slows the progression  of atherosclerotic disease.  Discussed with Dr. Mayme Genta, PA-C  10/14/2018 4:08 PM  This note was created with Dragon medical transcription system.  Any error is purely unintentional.

## 2018-10-14 NOTE — ED Triage Notes (Addendum)
Pt in via POV with complaints of acute onset left leg lower leg pain x one day, no redness/swelling noted at this time.  Pt with previous stent placement to same leg in March, pt taking plavix.  Pt hypertensive upon arrival, reports taking medication as prescribed, other vitals WDL.

## 2018-10-14 NOTE — Telephone Encounter (Signed)
Well it looks like she is in the ED currently.  Let them finish evaluating her and we can move up her appointment to come into the office sooner if necessary.

## 2018-10-14 NOTE — Progress Notes (Signed)
   [facility] Day: 0 days Lindsay [name] is a 77 y.o. female presenting with DVT .   Advance care planning discussed with patient  at bedside. All questions in regards to overall condition and expected prognosis answered.  She understands the diagnosis and overall prognosis.  She wishes to be full code.  The decision was made to  continue current code status  CODE STATUS: Full Code Time spent: 18 minutes

## 2018-10-15 ENCOUNTER — Encounter
Admission: EM | Disposition: A | Discharge status: Home / Self Care | Payer: Self-pay | Source: Home / Self Care | Attending: Internal Medicine

## 2018-10-15 DIAGNOSIS — I70222 Atherosclerosis of native arteries of extremities with rest pain, left leg: Secondary | ICD-10-CM

## 2018-10-15 DIAGNOSIS — T82868A Thrombosis of vascular prosthetic devices, implants and grafts, initial encounter: Secondary | ICD-10-CM

## 2018-10-15 DIAGNOSIS — I743 Embolism and thrombosis of arteries of the lower extremities: Secondary | ICD-10-CM

## 2018-10-15 HISTORY — PX: LOWER EXTREMITY ANGIOGRAPHY: CATH118251

## 2018-10-15 LAB — BASIC METABOLIC PANEL
ANION GAP: 8 (ref 5–15)
BUN: 12 mg/dL (ref 8–23)
CALCIUM: 8.7 mg/dL — AB (ref 8.9–10.3)
CO2: 25 mmol/L (ref 22–32)
CREATININE: 0.67 mg/dL (ref 0.44–1.00)
Chloride: 110 mmol/L (ref 98–111)
GFR calc Af Amer: >60 mL/min (ref >60)
GFR calc non Af Amer: >60 mL/min (ref >60)
GLUCOSE: 110 mg/dL — AB (ref 70–99)
Potassium: 3.4 mmol/L — ABNORMAL LOW (ref 3.5–5.1)
Sodium: 143 mmol/L (ref 135–145)

## 2018-10-15 LAB — CBC
HCT: 37.4 % (ref 36.0–46.0)
Hemoglobin: 13 g/dL (ref 12.0–15.0)
MCH: 29.9 pg (ref 26.0–34.0)
MCHC: 34.8 g/dL (ref 30.0–36.0)
MCV: 86 fL (ref 80.0–100.0)
PLATELETS: 219 10*3/uL (ref 150–400)
RBC: 4.35 MIL/uL (ref 3.87–5.11)
RDW: 13 % (ref 11.5–15.5)
WBC: 7 10*3/uL (ref 4.0–10.5)
nRBC: 0 % (ref 0.0–0.2)

## 2018-10-15 LAB — HEPARIN LEVEL (UNFRACTIONATED)
Heparin Unfractionated: 0.78 IU/mL — ABNORMAL HIGH (ref 0.30–0.70)
Heparin Unfractionated: 0.68 IU/mL (ref 0.30–0.70)
Heparin Unfractionated: 1.54 IU/mL — ABNORMAL HIGH (ref 0.30–0.70)

## 2018-10-15 SURGERY — LOWER EXTREMITY ANGIOGRAPHY
Anesthesia: Moderate Sedation | Laterality: Left

## 2018-10-15 MED ORDER — HEPARIN (PORCINE) IN NACL 1000-0.9 UT/500ML-% IV SOLN
INTRAVENOUS | Status: AC
  Filled 2018-10-15: qty 1000

## 2018-10-15 MED ORDER — IOPAMIDOL (ISOVUE-300) INJECTION 61%
INTRAVENOUS | Status: DC | PRN
  Administered 2018-10-15: 40 mL via INTRA_ARTERIAL

## 2018-10-15 MED ORDER — FENTANYL CITRATE (PF) 100 MCG/2ML IJ SOLN
INTRAMUSCULAR | Status: DC | PRN
  Administered 2018-10-15: 50 ug via INTRAVENOUS

## 2018-10-15 MED ORDER — ALTEPLASE 2 MG IJ SOLR
INTRAMUSCULAR | Status: DC | PRN
  Administered 2018-10-15: 6 mg

## 2018-10-15 MED ORDER — MIDAZOLAM HCL 2 MG/2ML IJ SOLN
INTRAMUSCULAR | Status: DC | PRN
  Administered 2018-10-15: 2 mg via INTRAVENOUS

## 2018-10-15 MED ORDER — ALTEPLASE 2 MG IJ SOLR
INTRAMUSCULAR | Status: AC
  Filled 2018-10-15: qty 6

## 2018-10-15 MED ORDER — LIDOCAINE-EPINEPHRINE (PF) 1 %-1:200000 IJ SOLN
INTRAMUSCULAR | Status: AC
  Filled 2018-10-15: qty 30

## 2018-10-15 MED ORDER — HEPARIN SODIUM (PORCINE) 1000 UNIT/ML IJ SOLN
INTRAMUSCULAR | Status: DC | PRN
  Administered 2018-10-15: 4000 [IU] via INTRAVENOUS

## 2018-10-15 SURGICAL SUPPLY — 18 items
BALLN LUTONIX 5X220X130 (BALLOONS) ×2
BALLN STERLING SL OTW 3X80X150 (BALLOONS) ×2
BALLOON LUTONIX 5X220X130 (BALLOONS) ×1 IMPLANT
BALLOON STRLNG SL OTW 3X80X150 (BALLOONS) ×1 IMPLANT
CANISTER PENUMBRA ENGINE (MISCELLANEOUS) ×2 IMPLANT
CATH INDIGO CAT6 KIT (CATHETERS) ×2 IMPLANT
CATH PIG 70CM (CATHETERS) ×2 IMPLANT
DEVICE PRESTO INFLATION (MISCELLANEOUS) ×2 IMPLANT
DEVICE STARCLOSE SE CLOSURE (Vascular Products) ×2 IMPLANT
GLIDEWIRE ADV .035X260CM (WIRE) ×2 IMPLANT
PACK ANGIOGRAPHY (CUSTOM PROCEDURE TRAY) ×2 IMPLANT
SHEATH BRITE TIP 5FRX11 (SHEATH) ×2 IMPLANT
SHEATH PINNACLE ST 6F 45CM (SHEATH) ×2 IMPLANT
STENT VIABAHN 6X250X120 (Permanent Stent) ×2 IMPLANT
SYR MEDRAD MARK V 150ML (SYRINGE) ×2 IMPLANT
TUBING CONTRAST HIGH PRESS 72 (TUBING) ×2 IMPLANT
WIRE G V18X300CM (WIRE) ×2 IMPLANT
WIRE J 3MM .035X145CM (WIRE) ×2 IMPLANT

## 2018-10-15 NOTE — Progress Notes (Signed)
ANTICOAGULATION CONSULT NOTE - Initial Consult  Pharmacy Consult for Heparin Indication: arterial occlusion  Allergies  Allergen Reactions  . Aspirin Other (See Comments)    GI BLEED  . Nsaids Other (See Comments)    GI BLEED  . Penicillins Itching, Rash and Other (See Comments)    Has patient had a PCN reaction causing immediate rash, facial/tongue/throat swelling, SOB or lightheadedness with hypotension: Yes Has patient had a PCN reaction causing severe rash involving mucus membranes or skin necrosis: No Has patient had a PCN reaction that required hospitalization No Has patient had a PCN reaction occurring within the last 10 years: No If all of the above answers are "NO", then may proceed with Cephalosporin use.   . Pollen Extract Other (See Comments)    Congestion, Eye Irritation    Patient Measurements: Height: 5\' 5"  (165.1 cm) Weight: 166 lb (75.3 kg) IBW/kg (Calculated) : 57 Heparin Dosing Weight: 75.3 kg  Vital Signs: Temp: 97.9 F (36.6 C) (11/12 2010) Temp Source: Oral (11/12 2010) BP: 178/89 (11/12 2010) Pulse Rate: 62 (11/12 2010)  Labs: Recent Labs    10/14/18 1542 10/15/18 0045  HGB 14.4 13.0  HCT 40.9 37.4  PLT 244 219  APTT 28  --   LABPROT 12.8  --   INR 0.97  --   HEPARINUNFRC  --  0.78*  CREATININE 0.75 0.67    Estimated Creatinine Clearance: 61.7 mL/min (by C-G formula based on SCr of 0.67 mg/dL).   Medical History: Past Medical History:  Diagnosis Date  . Anemia   . Anxiety   . Arthritis    osteo, back  . Depression   . Diverticulosis   . GERD (gastroesophageal reflux disease)   . Hematochezia   . Hypertension    controlled on meds  . Hypothyroidism   . PAD (peripheral artery disease) ([facility])     Medications:  Scheduled:  Infusions:  PRN:   Assessment: Patient has a hx of peripheral artery disease status post stent to the left lower extremity and presents to the emergency department for pain and coolness to left lower  extremity.  According to the patient she was at work when she felt like her left foot was feeling very cold.  States she ultimately went home but the monitor heated blanket, but states she continued to feel cold and developed pain and cramping in the foot and left calf. Team concern for arterial occlusion. Starting heparin per vascular surgery. Labs ordered. Start heparin after labs are drawn.  .  Goal of Therapy:  Heparin level 0.3-0.7 units/ml Monitor platelets by anticoagulation protocol: Yes   Plan:  Give 4500 units bolus x 1 Start heparin infusion at 1250 units/hr Check anti-Xa level in 8 hours and daily while on heparin Continue to monitor H&H and platelets  11/13 0045 heparin level 0.78. Decrease rate to 1150 units/hr and recheck in 8 hours.  Martice Doty S 10/15/2018,1:48 AM

## 2018-10-15 NOTE — Progress Notes (Signed)
Patient clinically stable post lower extremity angiogram per Dr Lucky Cowboy, right groin without bleeding nor hematoma. Vitals stable. Friend at bedside. Dr Lucky Cowboy out to speak with patient with questions answered. Report called to care nurse with questions answered. Plan reviewed.

## 2018-10-15 NOTE — Op Note (Signed)
[facility]  Percutaneous Study/Intervention Procedural Note   Date of Surgery: 10/15/2018  Surgeon(s):Chou Busler    Assistants:none  Pre-operative Diagnosis: PAD with rest Lindsay [name] left lower extremity, acute on chronic occlusion of previous left lower extremity intervention  Post-operative diagnosis:  Same  Procedure(s) Performed:             1.  Ultrasound guidance for vascular access right femoral artery             2.  Catheter placement into left SFA from right femoral approach             3.  Aortogram and selective left lower extremity angiogram             4.   Catheter directed thrombolytic therapy to the left SFA and proximal popliteal artery with 6 mg of TPA             5.   Mechanical thrombectomy of the left SFA and popliteal arteries with the penumbra cat 6 device  6.  Percutaneous transluminal angioplasty of the left proximal peroneal artery and tibioperoneal trunk with 3 mm diameter by 10 cm length angioplasty balloon  7.  Viabahn covered stent placement to the left SFA and popliteal arteries with 6 mm diameter by 25 cm length covered stent postdilated with a 5 mm diameter Lutonix drug-coated angioplasty balloon             8.  StarClose closure device right femoral artery  EBL: 100 cc  Contrast: 40 cc  Fluoro Time: 8.2 minutes  Moderate Conscious Sedation Time: approximately 40 minutes using 2 mg of Versed and 50 Mcg of Fentanyl              Indications:  Patient is a 77 y.o.female with new onset rest Lindsay [name] of her left foot. The patient has noninvasive study showing flow through the stent although there was some degree of stenosis about 2 weeks ago but now she has new symptoms and clinically no palpable pedal pulses worrisome for occlusion of her previous intervention. The patient is brought in for angiography for further evaluation and potential treatment.  Due to the limb threatening nature of the situation, angiogram was performed for attempted  limb salvage. The patient is aware that if the procedure fails, amputation would be expected.  The patient also understands that even with successful revascularization, amputation may still be required due to the severity of the situation.  Risks and benefits are discussed and informed consent is obtained.   Procedure:  The patient was identified and appropriate procedural time out was performed.  The patient was then placed supine on the table and prepped and draped in the usual sterile fashion. Moderate conscious sedation was administered during a face to face encounter with the patient throughout the procedure with my supervision of the RN administering medicines and monitoring the patient's vital signs, pulse oximetry, telemetry and mental status throughout from the start of the procedure until the patient was taken to the recovery room. Ultrasound was used to evaluate the right common femoral artery.  It was patent .  A digital ultrasound image was acquired.  A Seldinger needle was used to access the right common femoral artery under direct ultrasound guidance and a permanent image was performed.  A 0.035 J wire was advanced without resistance and a 5Fr sheath was placed.  Pigtail catheter was placed into the aorta and an AP aortogram was performed. This demonstrated normal renal arteries  and normal aorta and iliac segments without significant stenosis. I then crossed the aortic bifurcation with the pigtail catheter and an advantage wire and advanced to the left femoral head and then into the proximal left superficial femoral artery. Selective left lower extremity angiogram was then performed. This demonstrated that the common femoral artery and profunda femoris arteries appeared widely patent.  The proximal SFA was patent but there was an abrupt occlusion just above the previously placed stent.  This was patent on duplex a couple of weeks ago so thrombosis of the stent had occurred.  There was reconstitution  of the popliteal artery below the knee with what appeared to be about an 80 to 90% stenosis in the proximal peroneal artery and tibioperoneal trunk.  The posterior tibial artery also provided flow distally.  The anterior tibial artery was chronically occluded without distal reconstitution. It was felt that it was in the patient's best interest to proceed with intervention after these images to avoid a second procedure and a larger amount of contrast and fluoroscopy based off of the findings from the initial angiogram. The patient was systemically heparinized and a 6 [place] destination sheath was then placed over the Terumo Advantage wire. I then used a Kumpe catheter and the advantage wire to easily navigate through the SFA occlusion and confirm intraluminal flow in the below-knee popliteal artery that better opacify the tibial vessels.  I then cross the tibioperoneal trunk and proximal peroneal lesion with a 0.018 wire.  6 mg of TPA were instilled with the penumbra cat 6 device to the left SFA and popliteal arteries.  This was allowed to dwell for about 10 minutes.  Angioplasty was then performed of the left tibioperoneal trunk and proximal peroneal artery with a 3 mm diameter by 10 cm length angioplasty balloon inflated to 10 atm for 1 minute.  This resulted in less than 20% residual stenosis in the tibioperoneal trunk and proximal peroneal artery.  Mechanical thrombectomy was then performed of the left SFA and popliteal arteries.  Multiple passes with the penumbra cat 6 device were used in a significant amount of thrombus was returned.  I then performed imaging after thrombectomy and there was thrombus and greater than 80% residual stenosis at the leading edge of the previously placed stent in the mid SFA.  There is also thrombus and residual stenosis of about 90% in the popliteal artery just below the previously placed stents.  Given the presence of thrombus, I felt placing a covered stent would be prudent  before any angioplasty to the area.  A 6 mm diameter by 25 cm length covered stent covered the entirety of the area including the lesion proximal and distal to the previously placed stents.  This was then postdilated with a 5 mm diameter Lutonix drug-coated angioplasty balloon.  Completion imaging showed much better flow distally with about a 20% residual stenosis in the SFA and about a 10% residual stenosis in the popliteal artery and better runoff through both the peroneal artery and posterior tibial artery distally. I elected to terminate the procedure. The sheath was removed and StarClose closure device was deployed in the right femoral artery with excellent hemostatic result. The patient was taken to the recovery room in stable condition having tolerated the procedure well.  Findings:               Aortogram:  The renal arteries were widely patent.  The aorta was widely patent.  There is mild disease in the common iliac  arteries that was not flow-limiting and the 20 to 30% range.  Both external iliac arteries were widely patent.             Left lower Extremity:  Common femoral artery and profunda femoris arteries appeared widely patent.  The proximal SFA was patent but there was an abrupt occlusion just above the previously placed stent.  This was patent on duplex a couple of weeks ago so thrombosis of the stent had occurred.  There was reconstitution of the popliteal artery below the knee with what appeared to be about an 80 to 90% stenosis in the proximal peroneal artery and tibioperoneal trunk.  The posterior tibial artery also provided flow distally.  The anterior tibial artery was chronically occluded without distal reconstitution.   Disposition: Patient was taken to the recovery room in stable condition having tolerated the procedure well.  Complications: None  Lindsay Lindsay [name] 10/15/2018 5:32 PM   This note was created with Dragon Medical transcription system. Any errors in dictation are purely  unintentional.

## 2018-10-15 NOTE — H&P (Signed)
[facility] History & Physical Update  The patient was interviewed and re-examined.  The patient's previous History and Physical has been reviewed and is unchanged.  There is no change in the plan of care. We plan to proceed with the scheduled procedure.  Leotis Pain, MD  10/15/2018, 2:57 PM

## 2018-10-15 NOTE — Progress Notes (Signed)
ANTICOAGULATION CONSULT NOTE - Initial Consult  Pharmacy Consult for Heparin Indication: arterial occlusion  Allergies  Allergen Reactions  . Aspirin Other (See Comments)    GI BLEED  . Nsaids Other (See Comments)    GI BLEED  . Penicillins Itching, Rash and Other (See Comments)    Has patient had a PCN reaction causing immediate rash, facial/tongue/throat swelling, SOB or lightheadedness with hypotension: Yes Has patient had a PCN reaction causing severe rash involving mucus membranes or skin necrosis: No Has patient had a PCN reaction that required hospitalization No Has patient had a PCN reaction occurring within the last 10 years: No If all of the above answers are "NO", then may proceed with Cephalosporin use.   . Pollen Extract Other (See Comments)    Congestion, Eye Irritation    Patient Measurements: Height: 5\' 5"  (165.1 cm) Weight: 166 lb (75.3 kg) IBW/kg (Calculated) : 57 Heparin Dosing Weight: 75.3 kg  Vital Signs: Temp: 97.7 F (36.5 C) (11/13 1821) Temp Source: Oral (11/13 1821) BP: 183/76 (11/13 1821) Pulse Rate: 59 (11/13 1821)  Labs: Recent Labs    10/14/18 1542 10/15/18 0045 10/15/18 0957 10/15/18 1830  HGB 14.4 13.0  --   --   HCT 40.9 37.4  --   --   PLT 244 219  --   --   APTT 28  --   --   --   LABPROT 12.8  --   --   --   INR 0.97  --   --   --   HEPARINUNFRC  --  0.78* 0.68 1.54*  CREATININE 0.75 0.67  --   --     Estimated Creatinine Clearance: 61.7 mL/min (by C-G formula based on SCr of 0.67 mg/dL).   Medical History: Past Medical History:  Diagnosis Date  . Anemia   . Anxiety   . Arthritis    osteo, back  . Depression   . Diverticulosis   . GERD (gastroesophageal reflux disease)   . Hematochezia   . Hypertension    controlled on meds  . Hypothyroidism   . PAD (peripheral artery disease) (HCC)     Assessment: Patient has a hx of peripheral artery disease status post stent to the left lower extremity and presents to the  emergency department for pain and coolness to left lower extremity.  According to the patient she was at work when she felt like her left foot was feeling very cold.  States she ultimately went home but the monitor heated blanket, but states she continued to feel cold and developed pain and cramping in the foot and left calf. Team was concern for arterial occlusion. S/p lower extremity angiography.  On 11/12 a 4500 unit bolus was given followed by 1250 units/hr infusion. 11/13 @0045  Heparin level returned at 0.78 and rate was decreased to 1150 units/hr. 11/13 0957 Heparin level returned at 0.68 - no change to rate. 11/13 1830 heparin level returned at 1.54- this was an abnormal jump. Heparin was stopped for procedure and patient received a 4000 units bolus on 11/13 @1652  (1.5 hours prior to level being drawn). Heparin was restarted per RN but was not charted on the [facility]. RN confirmed it is running at 1150 units/hr.   .  Goal of Therapy:  Heparin level 0.3-0.7 units/ml Monitor platelets by anticoagulation protocol: Yes   Plan:  Will continue current rate (1150 units/hr) and recheck level in 6 hours in ensure patient does not remain supratherapeutic.  Check anti-Xa level  in 8 hours and daily while on heparin after the 6 hour level. Continue to monitor H&H and platelets  Oswald Hillock, PharmD Clinical Pharmacist 10/15/2018,7:15 PM

## 2018-10-15 NOTE — Progress Notes (Signed)
[facility] for Heparin Indication: arterial occlusion  Allergies  Allergen Reactions  . Aspirin Other (See Comments)    GI BLEED  . Nsaids Other (See Comments)    GI BLEED  . Penicillins Itching, Rash and Other (See Comments)    Has patient had a PCN reaction causing immediate rash, facial/tongue/throat swelling, SOB or lightheadedness with hypotension: Yes Has patient had a PCN reaction causing severe rash involving mucus membranes or skin necrosis: No Has patient had a PCN reaction that required hospitalization No Has patient had a PCN reaction occurring within the last 10 years: No If all of the above answers are "NO", then may proceed with Cephalosporin use.   . Pollen Extract Other (See Comments)    Congestion, Eye Irritation    Patient Measurements: Height: 5\' 5"  (165.1 cm) Weight: 166 lb (75.3 kg) IBW/kg (Calculated) : 57 Heparin Dosing Weight: 75.3 kg  Vital Signs: Temp: 98.2 F (36.8 C) (11/13 0754) Temp Source: Oral (11/13 0754) BP: 144/81 (11/13 0754) Pulse Rate: 60 (11/13 0754)  Labs: Recent Labs    10/14/18 1542 10/15/18 0045 10/15/18 0957  HGB 14.4 13.0  --   HCT 40.9 37.4  --   PLT 244 219  --   APTT 28  --   --   LABPROT 12.8  --   --   INR 0.97  --   --   HEPARINUNFRC  --  0.78* 0.68  CREATININE 0.75 0.67  --     Estimated Creatinine Clearance: 61.7 mL/min (by C-G formula based on SCr of 0.67 mg/dL).   Medical History: Past Medical History:  Diagnosis Date  . Anemia   . Anxiety   . Arthritis    osteo, back  . Depression   . Diverticulosis   . GERD (gastroesophageal reflux disease)   . Hematochezia   . Hypertension    controlled on meds  . Hypothyroidism   . PAD (peripheral artery disease) (HCC)     Medications:  Scheduled:  Infusions:  PRN:   Assessment: Patient has a hx of peripheral artery disease status post stent to the left lower extremity and presents to the emergency department  for pain and coolness to left lower extremity.  According to the patient she was at work when she felt like her left foot was feeling very cold.  States she ultimately went home but the monitor heated blanket, but states she continued to feel cold and developed pain and cramping in the foot and left calf. Team concern for arterial occlusion. Starting heparin per vascular surgery. Labs ordered. Start heparin after labs are drawn.  .  Goal of Therapy:  Heparin level 0.3-0.7 units/ml Monitor platelets by anticoagulation protocol: Yes   Plan:  Give 4500 units bolus x 1 Start heparin infusion at 1250 units/hr Check anti-Xa level in 8 hours and daily while on heparin Continue to monitor H&H and platelets  11/13 0957 heparin level 0.68. Will continue current rate of 1150 units/hr and check confirmatory level in 8 hours.  Nyomie Ehrlich A 10/15/2018,11:10 AM

## 2018-10-16 ENCOUNTER — Encounter: Payer: Self-pay | Admitting: Vascular Surgery

## 2018-10-16 LAB — URINALYSIS, COMPLETE (UACMP) WITH MICROSCOPIC
Bacteria, UA: NONE SEEN
Bilirubin Urine: NEGATIVE
Glucose, UA: NEGATIVE mg/dL
KETONES UR: NEGATIVE mg/dL
Nitrite: NEGATIVE
PH: 7 (ref 5.0–8.0)
Protein, ur: NEGATIVE mg/dL
Specific Gravity, Urine: 1.005 (ref 1.005–1.030)

## 2018-10-16 LAB — CBC
HEMATOCRIT: 34.1 % — AB (ref 36.0–46.0)
HEMOGLOBIN: 11.9 g/dL — AB (ref 12.0–15.0)
MCH: 30.7 pg (ref 26.0–34.0)
MCHC: 34.9 g/dL (ref 30.0–36.0)
MCV: 87.9 fL (ref 80.0–100.0)
Platelets: 197 10*3/uL (ref 150–400)
RBC: 3.88 MIL/uL (ref 3.87–5.11)
RDW: 13.3 % (ref 11.5–15.5)
WBC: 8.6 10*3/uL (ref 4.0–10.5)
nRBC: 0 % (ref 0.0–0.2)

## 2018-10-16 LAB — HEPARIN LEVEL (UNFRACTIONATED)
HEPARIN UNFRACTIONATED: 0.6 [IU]/mL (ref 0.30–0.70)
Heparin Unfractionated: 0.81 IU/mL — ABNORMAL HIGH (ref 0.30–0.70)

## 2018-10-16 MED ORDER — CEFDINIR 300 MG PO CAPS
300.0000 mg | ORAL_CAPSULE | Freq: Two times a day (BID) | ORAL | Status: DC
  Administered 2018-10-16: 300 mg via ORAL
  Filled 2018-10-16 (×2): qty 1

## 2018-10-16 MED ORDER — OXYCODONE HCL 5 MG PO TABS: 5.0000 mg | ORAL_TABLET | Freq: Four times a day (QID) | ORAL | 0 refills | Status: DC | PRN

## 2018-10-16 MED ORDER — APIXABAN 5 MG PO TABS
5.0000 mg | ORAL_TABLET | Freq: Two times a day (BID) | ORAL | Status: DC
  Administered 2018-10-16: 5 mg via ORAL
  Filled 2018-10-16: qty 1

## 2018-10-16 MED ORDER — APIXABAN 5 MG PO TABS: 5.0000 mg | ORAL_TABLET | Freq: Two times a day (BID) | ORAL | 3 refills | Status: DC

## 2018-10-16 MED ORDER — CEFUROXIME AXETIL 250 MG PO TABS: 250.0000 mg | ORAL_TABLET | Freq: Two times a day (BID) | ORAL | 0 refills | Status: AC

## 2018-10-16 MED ORDER — INFLUENZA VAC SPLIT HIGH-DOSE 0.5 ML IM SUSY: 0.5000 mL | PREFILLED_SYRINGE | INTRAMUSCULAR | 0 refills | Status: AC

## 2018-10-16 MED ORDER — NICOTINE 14 MG/24HR TD PT24: 14.0000 mg | MEDICATED_PATCH | Freq: Every day | TRANSDERMAL | 0 refills | Status: DC

## 2018-10-16 NOTE — Discharge Summary (Signed)
[place] at [place] NAME: Lindsay [name]    MR#:  [id number]  DATE OF BIRTH:  November 03, 1941  DATE OF ADMISSION:  10/14/2018 ADMITTING PHYSICIAN: Gladstone Lighter, MD  DATE OF DISCHARGE: 10/16/2018   PRIMARY CARE PHYSICIAN: Glendon Axe, MD    ADMISSION DIAGNOSIS:  Arterial occlusion [I70.90] Ischemic leg [I99.8]  DISCHARGE DIAGNOSIS:  Active Problems:   Claudication ([place])   SECONDARY DIAGNOSIS:   Past Medical History:  Diagnosis Date  . Anemia   . Anxiety   . Arthritis    osteo, back  . Depression   . Diverticulosis   . GERD (gastroesophageal reflux disease)   . Hematochezia   . Hypertension    controlled on meds  . Hypothyroidism   . PAD (peripheral artery disease) [facility])     HOSPITAL COURSE:   [name], ongoing smoking, [name], arthritis, [name]  1.Peripheral arterial disease with claudication to left foot-previous angioplasty to SFA and proximal popliteal artery and SFA stent placement. - IV heparin drip - Angiogram and angioplasty done- she had some blockage at her previous stent site- Local TPA given and . angioplasty done. - Vascular consult appreciated, suggest to switch to eliquis on discharge.  2. Hypertension-IV hydralazine PRN. Also on home medications-Norvasc, bisoprolol and hydrochlorothiazide  3. Hypothyroidism-Synthroid  4. DVT prophylaxis-currently on heparin drip  5. Pink urine   She does not have symptoms.   Will check UA and Ur cx, give cefuroxime for 3 days and fllow with PMD.     DISCHARGE CONDITIONS:  Stable.  CONSULTS OBTAINED:  Treatment Team:  Algernon Huxley, MD  DRUG ALLERGIES:   Allergies  Allergen Reactions  . Aspirin Other (See Comments)     GI BLEED  . Nsaids Other (See Comments)    GI BLEED  . Penicillins Itching, Rash and Other (See Comments)    Has patient had a PCN reaction causing immediate rash, facial/tongue/throat swelling, SOB or lightheadedness with hypotension: Yes Has patient had a PCN reaction causing severe rash involving mucus membranes or skin necrosis: No Has patient had a PCN reaction that required hospitalization No Has patient had a PCN reaction occurring within the last 10 years: No If all of the above answers are "NO", then may proceed with Cephalosporin use.   . Pollen Extract Other (See Comments)    Congestion, Eye Irritation    DISCHARGE MEDICATIONS:   Allergies as of 10/16/2018      Reactions   Aspirin Other (See Comments)   GI BLEED   Nsaids Other (See Comments)   GI BLEED   Penicillins Itching, Rash, Other (See Comments)   Has patient had a PCN reaction causing immediate rash, facial/tongue/throat swelling, SOB or lightheadedness with hypotension: Yes Has patient had a PCN reaction causing severe rash involving mucus membranes or skin necrosis: No Has patient had a PCN reaction that required hospitalization No Has patient had a PCN reaction occurring within the last 10 years: No If all of the above answers are "NO", then may proceed with Cephalosporin use.   Pollen Extract Other (See Comments)   Congestion, Eye Irritation      Medication List    STOP taking these medications   clopidogrel 75 MG tablet Commonly known as:  PLAVIX     TAKE these medications  acetaminophen 500 MG tablet Commonly known as:  TYLENOL Take 1,000 mg by mouth daily.   ALKA-SELTZER PLUS ALLERGY PO Take 1 tablet by mouth daily as needed (allergies).   amLODipine 5 MG tablet Commonly known as:  NORVASC Take 10 mg by mouth daily.   apixaban 5 MG Tabs tablet Commonly known as:  ELIQUIS Take 1 tablet (5 mg total) by mouth 2 (two) times daily.   atorvastatin 10 MG tablet Commonly known as:   LIPITOR Take 1 tablet (10 mg total) by mouth daily.   bisoprolol-hydrochlorothiazide 10-6.25 MG tablet Commonly known as:  ZIAC TAKE 1 TABLET BY MOUTH DAILY What changed:    how much to take  how to take this  when to take this  additional instructions   cefUROXime 250 MG tablet Commonly known as:  CEFTIN Take 1 tablet (250 mg total) by mouth 2 (two) times daily for 3 days.   gabapentin 300 MG capsule Commonly known as:  NEURONTIN Take 1 capsule (300 mg total) by mouth at bedtime.   Influenza vac split quadrivalent PF 0.5 ML injection Commonly known as:  FLUZONE HIGH-DOSE Inject 0.5 mLs into the muscle tomorrow at 10 am for 1 dose.   levothyroxine 50 MCG tablet Commonly known as:  SYNTHROID, LEVOTHROID TAKE ONE (1) TABLET BY MOUTH EVERY DAY What changed:    how much to take  how to take this  when to take this  additional instructions   losartan 100 MG tablet Commonly known as:  COZAAR TAKE ONE (1) TABLET BY MOUTH EVERY DAY What changed:    how much to take  how to take this  when to take this  additional instructions   MULTI COMPLETE PO Take 1 tablet by mouth daily.   nicotine 14 mg/24hr patch Commonly known as:  NICODERM CQ - dosed in mg/24 hours Place 1 patch (14 mg total) onto the skin daily.   oxyCODONE 5 MG immediate release tablet Commonly known as:  Oxy IR/ROXICODONE Take 1 tablet (5 mg total) by mouth every 6 (six) hours as needed for moderate pain.   pantoprazole 40 MG tablet Commonly known as:  PROTONIX TAKE ONE (1) TABLET EACH DAY What changed:    how much to take  how to take this  when to take this  additional instructions        DISCHARGE INSTRUCTIONS:    Follow with PMD in 1 week and with vascular surgery in 1 month.  If you experience worsening of your admission symptoms, develop shortness of breath, life threatening emergency, suicidal or homicidal thoughts you must seek medical attention immediately by calling  911 or calling your MD immediately  if symptoms less severe.  You Must read complete instructions/literature along with all the possible adverse reactions/side effects for all the Medicines you take and that have been prescribed to you. Take any new Medicines after you have completely understood and accept all the possible adverse reactions/side effects.   Please note  You were cared for by a hospitalist during your hospital stay. If you have any questions about your discharge medications or the care you received while you were in the hospital after you are discharged, you can call the unit and asked to speak with the hospitalist on call if the hospitalist that took care of you is not available. Once you are discharged, your primary care physician will handle any further medical issues. Please note that NO REFILLS for any discharge medications will be authorized once you  are discharged, as it is imperative that you return to your primary care physician (or establish a relationship with a primary care physician if you do not have one) for your aftercare needs so that they can reassess your need for medications and monitor your lab values.    Today   CHIEF COMPLAINT:   Chief Complaint  Patient presents with  . DVT    HISTORY OF PRESENT ILLNESS:  Lindsay [name]  is a 77 y.o. female with a known history of hypertension, ongoing smoking, peripheral arterial disease status post left popliteal and SFA angioplasty in March 2019, arthritis, [name] Patient has seen vascular surgery for follow-up about a week ago for right foot 3 toes tingling and was started on gabapentin with improvement.  ABI done during that visit showed good pulses to the left foot.  Patient had prior angioplasty to the left leg and stent placement in March 2019.  Since this morning she has noticed increased pain to the left foot and also noted that the foot was  cooler than normal.  She presented to the emergency room, unable to Doppler pulses in the left leg and is being admitted for claudication and need for angiogram again.   VITAL SIGNS:  Blood pressure 129/78, pulse 62, temperature 98.9 F (37.2 C), temperature source Oral, resp. rate 16, height 5\' 5"  (1.651 m), weight 75.3 kg, SpO2 99 %.  I/O:    Intake/Output Summary (Last 24 hours) at 10/16/2018 1149 Last data filed at 10/16/2018 1122 Gross per 24 hour  Intake 1496.7 ml  Output 400 ml  Net 1096.7 ml    PHYSICAL EXAMINATION:  GENERAL:  77 y.o.-year-old patient lying in the bed with no acute distress.  EYES: Pupils equal, round, reactive to light and accommodation. No scleral icterus. Extraocular muscles intact.  HEENT: Head atraumatic, normocephalic. Oropharynx and nasopharynx clear.  NECK:  Supple, no jugular venous distention. No thyroid enlargement, no tenderness.  LUNGS: Normal breath sounds bilaterally, no wheezing, rales,rhonchi or crepitation. No use of accessory muscles of respiration.  CARDIOVASCULAR: S1, S2 normal. No murmurs, rubs, or gallops.  ABDOMEN: Soft, non-tender, non-distended. Bowel sounds present. No organomegaly or mass.  EXTREMITIES: No pedal edema, cyanosis, or clubbing.  NEUROLOGIC: Cranial nerves II through XII are intact. Muscle strength 5/5 in all extremities. Sensation intact. Gait not checked.  PSYCHIATRIC: The patient is alert and oriented x 3.  SKIN: No obvious rash, lesion, or ulcer.   DATA REVIEW:   CBC Recent Labs  Lab 10/16/18 0413  WBC 8.6  HGB 11.9*  HCT 34.1*  PLT 197    Chemistries  Recent Labs  Lab 10/14/18 1542 10/15/18 0045  NA 143 143  K 4.1 3.4*  CL 109 110  CO2 28 25  GLUCOSE 107* 110*  BUN 11 12  CREATININE 0.75 0.67  CALCIUM 9.3 8.7*  AST 21  --   ALT 16  --   ALKPHOS 47  --   BILITOT 0.3  --     Cardiac Enzymes No results for input(s): TROPONINI in the last 168 hours.  Microbiology Results  Results for  orders placed or performed during the hospital encounter of 02/09/17  Blood culture (routine x 2)     Status: None   Collection Time: 02/09/17  2:39 AM  Result Value Ref Range Status   Specimen Description BLOOD LEFT ANTECUBITAL  Final   Special Requests BOTTLES DRAWN AEROBIC AND ANAEROBIC  BCAV  Final   Culture NO GROWTH 5 DAYS  Final   Report Status 02/14/2017 FINAL  Final  Blood culture (routine x 2)     Status: None   Collection Time: 02/09/17  2:39 AM  Result Value Ref Range Status   Specimen Description BLOOD RIGHT ANTECUBITAL  Final   Special Requests BOTTLES DRAWN AEROBIC AND ANAEROBIC BCAV  Final   Culture NO GROWTH 5 DAYS  Final   Report Status 02/14/2017 FINAL  Final    RADIOLOGY:  No results found.  EKG:   Orders placed or performed during the hospital encounter of 02/09/17  . EKG 12-Lead  . EKG 12-Lead  . ED EKG within 10 minutes  . ED EKG within 10 minutes  . EKG      Management plans discussed with the patient, family and they are in agreement.  CODE STATUS: Full.    Code Status Orders  (From admission, onward)         Start     Ordered   10/14/18 1808  Full code  Continuous     10/14/18 1807        Code Status History    Date Active Date Inactive Code Status Order ID Comments User Context   02/27/2018 0929 02/27/2018 1436 Full Code [id number]  Algernon Huxley, MD Inpatient   02/09/2017 0412 02/10/2017 1445 Full Code [id number]  Harrie Foreman, MD Inpatient   06/25/2015 1330 06/26/2015 1220 Full Code [id number]  Vaughan Basta, MD Inpatient      TOTAL TIME TAKING CARE OF THIS PATIENT: 35 minutes.    Vaughan Basta M.D on 10/16/2018 at 11:49 AM  Between 7am to 6pm - Pager - [phone]  After 6pm go to [email or web address] - password [email or web address] [place] [facility]  Office  [phone]  CC: Primary care physician; Glendon Axe, MD   Note: This dictation was prepared with Dragon dictation along with smaller phrase  technology. Any transcriptional errors that result from this process are unintentional.

## 2018-10-16 NOTE — Progress Notes (Signed)
Lindsay Lindsay [name] Lindsay [name]  Daily Progress Note   Subjective: 1 Day Post-Op: Ultrasound guidance for vascular access right femoral artery, Catheter placement into left SFA from right femoral approach, Aortogram and selective left lower extremity angiogram, Catheter directed thrombolytic therapy to Lindsay Lindsay [name] left SFA and proximal popliteal artery with 6 mg of TPA, Mechanical thrombectomy of Lindsay Lindsay [name] left SFA and popliteal arteries with Lindsay Lindsay [name] penumbra cat 6 device, Percutaneous transluminal angioplasty of Lindsay Lindsay [name] left proximal peroneal artery and tibioperoneal trunk with 3 mm diameter by 10 cm length angioplasty balloon, Viabahn covered stent placement to Lindsay Lindsay [name] left SFA and popliteal arteries with 6 mm diameter by 25 cm length covered stent postdilated with a 5 mm diameter Lutonix drug-coated angioplasty balloon, StarClose closure device right femoral artery  Lindsay [name].  Lindsay [name] with improved left lower extremity discomfort.  Lindsay Lindsay [name] Lindsay [name].  Lindsay [name].  Medicine present at Lindsay Lindsay [name] time of our examination.  Medicine will order a urine culture and start Lindsay Lindsay [name] Lindsay [name] on empiric antibiotics for UTI. Lindsay Lindsay [name] Lindsay [name] was instructed to call her primary care office if she continues to experience discomfort / blood in her urine after 24 hours on ABX. She expresses her understanding.  Objective: Vitals:   10/15/18 1821 10/15/18 2247 10/16/18 0553 10/16/18 0756  BP: (!) 183/76 133/69 139/70 129/78  Pulse: (!) 59 62 65 62  Resp: 16 18 18 16   Temp: 97.7 F (36.5 C) 97.6 F (36.4 C) 98.3 F (36.8 C) 98.9 F (37.2 C)  TempSrc: Oral Oral Oral Oral  SpO2: 100% 99% 100% 99%  Weight:      Height:        Intake/Output Summary (Last 24 hours) at 10/16/2018 1057 Last data filed at 10/16/2018 0900 Gross per 24 hour  Intake 1024.2 ml  Output 300 ml  Net 724.2  ml   Physical Exam: A&Ox3, NAD CV: RRR Pulmonary: CTA Bilaterally Abdomen: Soft, Nontender, Nondistended, (+) Bowel Sounds Vascular:  Left Lower Extremity: Thigh soft, calf soft. Improvement in physical exam. Lindsay Lindsay [name] entire extremity is warm - down to Lindsay Lindsay [name] left toes. Good capillary refill. Non-tender to palpation. Motor / Sensory is intact. Pallor has improved.    Laboratory: CBC    Component Value Date/Time   WBC 8.6 10/16/2018 0413   HGB 11.9 (L) 10/16/2018 0413   HGB 8.4 (L) 03/20/2015 0500   HCT 34.1 (L) 10/16/2018 0413   HCT 24.8 (L) 03/20/2015 0500   PLT 197 10/16/2018 0413   PLT 119 (L) 03/20/2015 0500   BMET    Component Value Date/Time   NA 143 10/15/2018 0045   NA 145 03/20/2015 0500   K 3.4 (L) 10/15/2018 0045   K 3.9 03/20/2015 0500   CL 110 10/15/2018 0045   CL 117 (H) 03/20/2015 0500   CO2 25 10/15/2018 0045   CO2 27 03/20/2015 0500   GLUCOSE 110 (H) 10/15/2018 0045   GLUCOSE 122 (H) 03/20/2015 0500   BUN 12 10/15/2018 0045   BUN 11 03/20/2015 0500   CREATININE 0.67 10/15/2018 0045   CREATININE 0.63 03/20/2015 0500   CALCIUM 8.7 (L) 10/15/2018 0045   CALCIUM 7.8 (L) 03/20/2015 0500   GFRNONAA >60 10/15/2018 0045   GFRNONAA >60 03/20/2015 0500   GFRAA >60 10/15/2018 0045   GFRAA >60 03/20/2015 0500   Assessment/Planning: Lindsay Lindsay [name] Lindsay [name] is a 77 year old female with a  past medical history of peripheral artery disease to Lindsay Lindsay [name] left lower extremity who presented to [name] emergency department with a occluded stent who is now status post a left lower extremity angiogram with intervention which was successful in restoring arterial blood flow - POD#1 1) Will transition Lindsay [name] from Plavix to Eliquis 2) Lindsay [name] is allergic to ASA. She is on a statin. 3) Lindsay [name] to follow up with [place] one month with an ABI. This has been placed in Lindsay Lindsay [name] patients chart. 4) Ok to stop heparin drip 5) OK from vascular perspective to discharge home  Discussed  with Dr. Ellis [facility] [employer] PA-C 10/16/2018 10:57 AM

## 2018-10-16 NOTE — Progress Notes (Signed)
[place] at [place] NAME: Lindsay [name]    MR#:  [id number]  DATE OF BIRTH:  1941/08/16  SUBJECTIVE:  CHIEF COMPLAINT:   Chief Complaint  Patient presents with  . DVT   Came with claudication pain on leg. Plan for angiogram today.  REVIEW OF SYSTEMS:   CONSTITUTIONAL: No fever, fatigue or weakness.  EYES: No blurred or double vision.  EARS, NOSE, AND THROAT: No tinnitus or ear pain.  RESPIRATORY: No cough, shortness of breath, wheezing or hemoptysis.  CARDIOVASCULAR: No chest pain, orthopnea, edema.  GASTROINTESTINAL: No nausea, vomiting, diarrhea or abdominal pain.  GENITOURINARY: No dysuria, hematuria.  ENDOCRINE: No polyuria, nocturia,  HEMATOLOGY: No anemia, easy bruising or bleeding SKIN: No rash or lesion. MUSCULOSKELETAL: No joint pain or arthritis.   NEUROLOGIC: No tingling, numbness, weakness.  PSYCHIATRY: No anxiety or depression.   ROS  DRUG ALLERGIES:   Allergies  Allergen Reactions  . Aspirin Other (See Comments)    GI BLEED  . Nsaids Other (See Comments)    GI BLEED  . Penicillins Itching, Rash and Other (See Comments)    Has patient had a PCN reaction causing immediate rash, facial/tongue/throat swelling, SOB or lightheadedness with hypotension: Yes Has patient had a PCN reaction causing severe rash involving mucus membranes or skin necrosis: No Has patient had a PCN reaction that required hospitalization No Has patient had a PCN reaction occurring within the last 10 years: No If all of the above answers are "NO", then may proceed with Cephalosporin use.   . Pollen Extract Other (See Comments)    Congestion, Eye Irritation    VITALS:  Blood pressure 129/78, pulse 62, temperature 98.9 F (37.2 C), temperature source Oral, resp. rate 16, height 5\' 5"  (1.651 m), weight 75.3 kg, SpO2 99 %.  PHYSICAL EXAMINATION:  GENERAL:  77 y.o.-year-old patient lying in the bed with no acute distress.  EYES: Pupils  equal, round, reactive to light and accommodation. No scleral icterus. Extraocular muscles intact.  HEENT: Head atraumatic, normocephalic. Oropharynx and nasopharynx clear.  NECK:  Supple, no jugular venous distention. No thyroid enlargement, no tenderness.  LUNGS: Normal breath sounds bilaterally, no wheezing, rales,rhonchi or crepitation. No use of accessory muscles of respiration.  CARDIOVASCULAR: S1, S2 normal. No murmurs, rubs, or gallops.  ABDOMEN: Soft, nontender, nondistended. Bowel sounds present. No organomegaly or mass.  EXTREMITIES: No pedal edema, cyanosis, or clubbing.  NEUROLOGIC: Cranial nerves II through XII are intact. Muscle strength 5/5 in all extremities. Sensation intact. Gait not checked.  PSYCHIATRIC: The patient is alert and oriented x 3.  SKIN: No obvious rash, lesion, or ulcer.   Physical Exam LABORATORY PANEL:   CBC Recent Labs  Lab 10/16/18 0413  WBC 8.6  HGB 11.9*  HCT 34.1*  PLT 197   ------------------------------------------------------------------------------------------------------------------  Chemistries  Recent Labs  Lab 10/14/18 1542 10/15/18 0045  NA 143 143  K 4.1 3.4*  CL 109 110  CO2 28 25  GLUCOSE 107* 110*  BUN 11 12  CREATININE 0.75 0.67  CALCIUM 9.3 8.7*  AST 21  --   ALT 16  --   ALKPHOS 47  --   BILITOT 0.3  --    ------------------------------------------------------------------------------------------------------------------  Cardiac Enzymes No results for input(s): TROPONINI in the last 168 hours. ------------------------------------------------------------------------------------------------------------------  RADIOLOGY:  No results found.  ASSESSMENT AND PLAN:   Active Problems:   Claudication ([place])  Kerrin Markman  is a 77 y.o. female with a known history  of hypertension, ongoing smoking, peripheral arterial disease status post left popliteal and SFA angioplasty in March 2019, arthritis, GERD presents to  hospital secondary to worsening pain to left foot and cold foot.  1.  Peripheral arterial disease with claudication to left foot-previous angioplasty to SFA and proximal popliteal artery and SFA stent placement. - IV heparin drip - Angiogram today. - Vascular consult  2.  Hypertension-IV hydralazine PRN.  Also on home medications-Norvasc, bisoprolol and hydrochlorothiazide  3.  Hypothyroidism-Synthroid  4.  DVT prophylaxis-currently on heparin drip    All the records are reviewed and case discussed with Care Management/Social Workerr. Management plans discussed with the patient, family and they are in agreement.  CODE STATUS: Full.  TOTAL TIME TAKING CARE OF THIS PATIENT: 35 minutes.    POSSIBLE D/C IN 1-2 DAYS, DEPENDING ON CLINICAL CONDITION.   Vaughan Basta M.D on 10/16/2018   Between 7am to 6pm - Pager - [phone]  After 6pm go to [email or web address] - password [email or web address] [place] [facility]  Office  [phone]  CC: Primary care physician; Glendon Axe, MD  Note: This dictation was prepared with Dragon dictation along with smaller phrase technology. Any transcriptional errors that result from this process are unintentional.

## 2018-10-16 NOTE — Progress Notes (Signed)
ANTICOAGULATION CONSULT NOTE - Initial Consult  Pharmacy Consult for Heparin Indication: arterial occlusion  Patient Measurements: Height: 5\' 5"  (165.1 cm) Weight: 166 lb (75.3 kg) IBW/kg (Calculated) : 57 Heparin Dosing Weight: 75.3 kg  Vital Signs: Temp: 98.3 F (36.8 C) (11/14 0553) Temp Source: Oral (11/14 0553) BP: 139/70 (11/14 0553) Pulse Rate: 65 (11/14 0553)  Labs: Recent Labs    10/14/18 1542  10/15/18 0045 10/15/18 0957 10/15/18 1830 10/15/18 2353 10/16/18 0413  HGB 14.4  --  13.0  --   --   --  11.9*  HCT 40.9  --  37.4  --   --   --  34.1*  PLT 244  --  219  --   --   --  197  APTT 28  --   --   --   --   --   --   LABPROT 12.8  --   --   --   --   --   --   INR 0.97  --   --   --   --   --   --   HEPARINUNFRC  --    < > 0.78* 0.68 1.54* 0.81*  --   CREATININE 0.75  --  0.67  --   --   --   --    < > = values in this interval not displayed.    Estimated Creatinine Clearance: 61.7 mL/min (by C-G formula based on SCr of 0.67 mg/dL).   Medical History: Past Medical History:  Diagnosis Date  . Anemia   . Anxiety   . Arthritis    osteo, back  . Depression   . Diverticulosis   . GERD (gastroesophageal reflux disease)   . Hematochezia   . Hypertension    controlled on meds  . Hypothyroidism   . PAD (peripheral artery disease) (HCC)     Assessment: Patient has a hx of peripheral artery disease status post stent to the left lower extremity and presents to the emergency department for pain and coolness to left lower extremity.  According to the patient she was at work when she felt like her left foot was feeling very cold.  States she ultimately went home but the monitor heated blanket, but states she continued to feel cold and developed pain and cramping in the foot and left calf. Team was concern for arterial occlusion. S/p lower extremity angiography. Hgb down slightly from 13 to 11.9, will continue to follow  Heparin Course:  11/12 Initiation:  4500 unit bolus then 1250 units/hr infusion.  11/13 0045 HL 0.78 rate decreased to 1150 units/hr 11/13 0957 HL 0.68  11/13 1830 HL 1.54 Heparin was stopped for procedure  11/13 1652 restarted with 4000 units bolus on  (1.5 hours prior to level being drawn). Heparin was restarted per RN but was not charted on the [facility]. RN confirmed it is running at 1150 units/hr.  11/13 2353 HL 0.81 rate decreased to 1000 units/hr 11/14 0933 HL 0.60  .  Goal of Therapy:  Heparin level 0.3-0.7 units/ml Monitor platelets by anticoagulation protocol: Yes   Plan:  Maintain infusion at 1000 units/hr Check anti-Xa level in 8 hours and daily while on heparin  Continue to monitor H&H and platelets.  Dallie Piles, PharmD Clinical Pharmacist 10/16/2018,7:27 AM

## 2018-10-16 NOTE — Progress Notes (Signed)
ANTICOAGULATION CONSULT NOTE - Initial Consult  Pharmacy Consult for Heparin Indication: arterial occlusion  Allergies  Allergen Reactions  . Aspirin Other (See Comments)    GI BLEED  . Nsaids Other (See Comments)    GI BLEED  . Penicillins Itching, Rash and Other (See Comments)    Has patient had a PCN reaction causing immediate rash, facial/tongue/throat swelling, SOB or lightheadedness with hypotension: Yes Has patient had a PCN reaction causing severe rash involving mucus membranes or skin necrosis: No Has patient had a PCN reaction that required hospitalization No Has patient had a PCN reaction occurring within the last 10 years: No If all of the above answers are "NO", then may proceed with Cephalosporin use.   . Pollen Extract Other (See Comments)    Congestion, Eye Irritation    Patient Measurements: Height: 5\' 5"  (165.1 cm) Weight: 166 lb (75.3 kg) IBW/kg (Calculated) : 57 Heparin Dosing Weight: 75.3 kg  Vital Signs: Temp: 97.6 F (36.4 C) (11/13 2247) Temp Source: Oral (11/13 2247) BP: 133/69 (11/13 2247) Pulse Rate: 62 (11/13 2247)  Labs: Recent Labs    10/14/18 1542  10/15/18 0045 10/15/18 0957 10/15/18 1830 10/15/18 2353  HGB 14.4  --  13.0  --   --   --   HCT 40.9  --  37.4  --   --   --   PLT 244  --  219  --   --   --   APTT 28  --   --   --   --   --   LABPROT 12.8  --   --   --   --   --   INR 0.97  --   --   --   --   --   HEPARINUNFRC  --    < > 0.78* 0.68 1.54* 0.81*  CREATININE 0.75  --  0.67  --   --   --    < > = values in this interval not displayed.    Estimated Creatinine Clearance: 61.7 mL/min (by C-G formula based on SCr of 0.67 mg/dL).   Medical History: Past Medical History:  Diagnosis Date  . Anemia   . Anxiety   . Arthritis    osteo, back  . Depression   . Diverticulosis   . GERD (gastroesophageal reflux disease)   . Hematochezia   . Hypertension    controlled on meds  . Hypothyroidism   . PAD (peripheral artery  disease) (HCC)     Assessment: Patient has a hx of peripheral artery disease status post stent to the left lower extremity and presents to the emergency department for pain and coolness to left lower extremity.  According to the patient she was at work when she felt like her left foot was feeling very cold.  States she ultimately went home but the monitor heated blanket, but states she continued to feel cold and developed pain and cramping in the foot and left calf. Team was concern for arterial occlusion. S/p lower extremity angiography.  On 11/12 a 4500 unit bolus was given followed by 1250 units/hr infusion. 11/13 @0045  Heparin level returned at 0.78 and rate was decreased to 1150 units/hr. 11/13 0957 Heparin level returned at 0.68 - no change to rate. 11/13 1830 heparin level returned at 1.54- this was an abnormal jump. Heparin was stopped for procedure and patient received a 4000 units bolus on 11/13 @1652  (1.5 hours prior to level being drawn). Heparin was restarted per  RN but was not charted on the [facility]. RN confirmed it is running at 1150 units/hr.   .  Goal of Therapy:  Heparin level 0.3-0.7 units/ml Monitor platelets by anticoagulation protocol: Yes   Plan:  Will continue current rate (1150 units/hr) and recheck level in 6 hours in ensure patient does not remain supratherapeutic.  Check anti-Xa level in 8 hours and daily while on heparin after the 6 hour level. Continue to monitor H&H and platelets.  11/13 PM heparin level 0.81. Decrease to 1000 units/hr and recheck in 8 hours.  Eloise Harman, PharmD Clinical Pharmacist 10/16/2018,12:48 AM

## 2018-10-16 NOTE — Discharge Instructions (Signed)
You may remove any groin dressings and shower as of tomorrow (Friday 10/17/18). Please keep your groins clean and dry. Sutures are under the skin. They do not need to be removed.

## 2018-10-17 LAB — URINE CULTURE: Culture: <10000 — AB

## 2018-10-21 DIAGNOSIS — I739 Peripheral vascular disease, unspecified: Secondary | ICD-10-CM | POA: Diagnosis not present

## 2018-10-21 DIAGNOSIS — Z23 Encounter for immunization: Secondary | ICD-10-CM | POA: Diagnosis not present

## 2018-10-21 DIAGNOSIS — I10 Essential (primary) hypertension: Secondary | ICD-10-CM | POA: Diagnosis not present

## 2018-10-21 DIAGNOSIS — K219 Gastro-esophageal reflux disease without esophagitis: Secondary | ICD-10-CM | POA: Diagnosis not present

## 2018-10-21 DIAGNOSIS — M8589 Other specified disorders of bone density and structure, multiple sites: Secondary | ICD-10-CM | POA: Diagnosis not present

## 2018-10-21 DIAGNOSIS — E039 Hypothyroidism, unspecified: Secondary | ICD-10-CM | POA: Diagnosis not present

## 2018-10-21 DIAGNOSIS — Z0000 Encounter for general adult medical examination without abnormal findings: Secondary | ICD-10-CM | POA: Diagnosis not present

## 2018-10-21 DIAGNOSIS — Z1239 Encounter for other screening for malignant neoplasm of breast: Secondary | ICD-10-CM | POA: Diagnosis not present

## 2018-10-23 ENCOUNTER — Other Ambulatory Visit: Payer: Self-pay | Admitting: Internal Medicine

## 2018-10-23 DIAGNOSIS — Z1231 Encounter for screening mammogram for malignant neoplasm of breast: Secondary | ICD-10-CM

## 2018-10-23 DIAGNOSIS — R69 Illness, unspecified: Secondary | ICD-10-CM | POA: Diagnosis not present

## 2018-10-24 ENCOUNTER — Ambulatory Visit (INDEPENDENT_AMBULATORY_CARE_PROVIDER_SITE_OTHER): Payer: Medicare HMO | Admitting: Vascular Surgery

## 2018-10-24 ENCOUNTER — Encounter (INDEPENDENT_AMBULATORY_CARE_PROVIDER_SITE_OTHER): Payer: Medicare HMO

## 2018-10-28 DIAGNOSIS — Z780 Asymptomatic menopausal state: Secondary | ICD-10-CM | POA: Diagnosis not present

## 2018-10-29 DIAGNOSIS — N760 Acute vaginitis: Secondary | ICD-10-CM | POA: Diagnosis not present

## 2018-10-29 DIAGNOSIS — R350 Frequency of micturition: Secondary | ICD-10-CM | POA: Diagnosis not present

## 2018-11-05 ENCOUNTER — Ambulatory Visit (INDEPENDENT_AMBULATORY_CARE_PROVIDER_SITE_OTHER): Payer: Medicare HMO | Admitting: Nurse Practitioner

## 2018-11-05 ENCOUNTER — Encounter (INDEPENDENT_AMBULATORY_CARE_PROVIDER_SITE_OTHER): Payer: Medicare HMO

## 2018-11-17 ENCOUNTER — Encounter (INDEPENDENT_AMBULATORY_CARE_PROVIDER_SITE_OTHER): Payer: Self-pay | Admitting: Nurse Practitioner

## 2018-11-17 ENCOUNTER — Ambulatory Visit (INDEPENDENT_AMBULATORY_CARE_PROVIDER_SITE_OTHER): Payer: Medicare HMO | Admitting: Nurse Practitioner

## 2018-11-17 ENCOUNTER — Other Ambulatory Visit (INDEPENDENT_AMBULATORY_CARE_PROVIDER_SITE_OTHER): Payer: Self-pay | Admitting: Vascular Surgery

## 2018-11-17 ENCOUNTER — Ambulatory Visit (INDEPENDENT_AMBULATORY_CARE_PROVIDER_SITE_OTHER): Payer: Medicare HMO

## 2018-11-17 VITALS — BP 117/70 | HR 63 | Resp 14 | Ht 65.0 in | Wt 162.6 lb

## 2018-11-17 DIAGNOSIS — I739 Peripheral vascular disease, unspecified: Secondary | ICD-10-CM | POA: Diagnosis not present

## 2018-11-17 DIAGNOSIS — I10 Essential (primary) hypertension: Secondary | ICD-10-CM | POA: Diagnosis not present

## 2018-11-17 DIAGNOSIS — E785 Hyperlipidemia, unspecified: Secondary | ICD-10-CM | POA: Diagnosis not present

## 2018-11-17 NOTE — Progress Notes (Signed)
Subjective:    Patient ID: Lindsay [name], female    DOB: 28-Jun-1941, 77 y.o.   MRN: [id number] Chief Complaint  Patient presents with  . Follow-up    HPI  Lindsay [name] is a 77 y.o. female that presents today for follow-up after angiogram on 10/15/2018.  The patient recently presented to [facility] for an ischemic leg on 10/14/2018.  During her angioplasty was found that she had acute thrombosis of her left SFA and proximal popliteal artery.  She was placed on Eliquis at discharge.  However today the patient states that she would not be taking Eliquis due to the cost.  She also does not like the fact that it is twice a day.  She also did not want Xarelto as she felt that the cost would be comparable to Eliquis.  Patient also does not want Coumadin she does not want to undergo weekly blood work.  Prior to the intervention the patient was having some pain in her right lower extremity.  She was given Neurontin to determine if it was truly neuropathic in nature.  She states that she did not like the way this made her feel and felt as if it made her pain worse.  She states that Zanaflex helped her pain.  Patient denies any fever, chills, nausea, vomiting or diarrhea.  She denies any chest pain or shortness of breath.  She endorses having claudication-like symptoms of her right lower extremity.  Today she underwent bilateral ABIs.  Right lower extremity is 0.51 compared to 0.67 on 10/03/2018.  Her left lower extremity is 0.91 compared to 0.98 on 10/03/2018.  She has biphasic waveforms in her left tibial arteries whereas she has monophasic waveforms in her right anterior tibial artery with biphasic waveforms in her posterior tibial artery. Past Medical History:  Diagnosis Date  . Anemia   . Anxiety   . Arthritis    osteo, back  . Depression   . Diverticulosis   . GERD (gastroesophageal reflux disease)   . Hematochezia   . Hypertension    controlled on meds  .  Hypothyroidism   . PAD (peripheral artery disease) ([place])     Past Surgical History:  Procedure Laterality Date  . ABDOMINAL HYSTERECTOMY    . CARDIAC CATHETERIZATION  1995   normal, Dr Clayborn Bigness  . COLONOSCOPY  12/29/08  . COLONOSCOPY N/A 04/18/2015   Procedure: COLONOSCOPY;  Surgeon: Lucilla Lame, MD;  Location: [facility];  Service: Gastroenterology;  Laterality: N/A;  cecum time- 0957  . FOOT SURGERY    . KNEE ARTHROSCOPY Right 10/05/2015   Procedure: ARTHROSCOPY KNEE, partial lateral menisectomy;  Surgeon: Earnestine Leys, MD;  Location: [facility];  Service: Orthopedics;  Laterality: Right;  . LOWER EXTREMITY ANGIOGRAPHY Left 02/27/2018   Procedure: LOWER EXTREMITY ANGIOGRAPHY;  Surgeon: Algernon Huxley, MD;  Location: [facility];  Service: Cardiovascular;  Laterality: Left;  . LOWER EXTREMITY ANGIOGRAPHY Left 10/15/2018   Procedure: LOWER EXTREMITY ANGIOGRAPHY;  Surgeon: Algernon Huxley, MD;  Location: [facility];  Service: Cardiovascular;  Laterality: Left;  . POLYPECTOMY  04/18/2015   Procedure: POLYPECTOMY INTESTINAL;  Surgeon: Lucilla Lame, MD;  Location: [facility];  Service: Gastroenterology;;  . WRIST SURGERY      Social History   Socioeconomic History  . Marital status: Married    Spouse name: Not on file  . Number of children: Not on file  . Years of education: Not on file  .  Highest education level: Not on file  Occupational History  . Not on file  Social Needs  . Financial resource strain: Not on file  . Food insecurity:    Worry: Not on file    Inability: Not on file  . Transportation needs:    Medical: Not on file    Non-medical: Not on file  Tobacco Use  . Smoking status: Former Smoker    Packs/day: 0.50    Years: 30.00    Pack years: 15.00    Types: Cigarettes    Last attempt to quit: 10/14/2018    Years since quitting: 0.0  . Smokeless tobacco: Never Used  Substance and Sexual Activity  . Alcohol use: Yes    Alcohol/week:  0.0 standard drinks    Comment: occassionally/ whiskey on weekend; total of 4 drinks per week  . Drug use: No  . Sexual activity: Not Currently  Lifestyle  . Physical activity:    Days per week: Not on file    Minutes per session: Not on file  . Stress: Not on file  Relationships  . Social connections:    Talks on phone: Not on file    Gets together: Not on file    Attends religious service: Not on file    Active member of club or organization: Not on file    Attends meetings of clubs or organizations: Not on file    Relationship status: Not on file  . Intimate partner violence:    Fear of current or ex partner: Not on file    Emotionally abused: Not on file    Physically abused: Not on file    Forced sexual activity: Not on file  Other Topics Concern  . Not on file  Social History Narrative   From [place].   [place]- 20 hours week.    Husband in nursing home in [place].    1 child; 3 step children   Lives by herself, independent          Family History  Problem Relation Age of Onset  . Diabetes Mother   . Hypertension Mother   . Diabetes Sister   . Hypertension Sister     Allergies  Allergen Reactions  . Aspirin Other (See Comments)    GI BLEED  . Nsaids Other (See Comments)    GI BLEED  . Penicillins Itching, Rash and Other (See Comments)    Has patient had a PCN reaction causing immediate rash, facial/tongue/throat swelling, SOB or lightheadedness with hypotension: Yes Has patient had a PCN reaction causing severe rash involving mucus membranes or skin necrosis: No Has patient had a PCN reaction that required hospitalization No Has patient had a PCN reaction occurring within the last 10 years: No If all of the above answers are "NO", then may proceed with Cephalosporin use.   . Pollen Extract Other (See Comments)    Congestion, Eye Irritation     Review of Systems   Review of Systems: Negative Unless  Checked Constitutional: [] Weight loss  [] Fever  [] Chills Cardiac: [] Chest pain   []  Atrial Fibrillation  [] Palpitations   [] Shortness of breath when laying flat   [] Shortness of breath with exertion. Vascular:  [] Pain in legs with walking   [x] Pain in legs with standing  [] History of DVT   [] Phlebitis   [] Swelling in legs   [] Varicose veins   [] Non-healing ulcers Pulmonary:   [] Uses home oxygen   [] Productive cough   [] Hemoptysis   []   Wheeze  [] COPD   [] Asthma Neurologic:  [] Dizziness   [] Seizures   [] History of stroke   [] History of TIA  [] Aphasia   [] Vissual changes   [] Weakness or numbness in arm   [x] Weakness or numbness in leg Musculoskeletal:   [] Joint swelling   [] Joint pain   [] Low back pain  []  History of Knee Replacement Hematologic:  [] Easy bruising  [] Easy bleeding   [] Hypercoagulable state   [] Anemic Gastrointestinal:  [] Diarrhea   [] Vomiting  [x] Gastroesophageal reflux/heartburn   [] Difficulty swallowing. Genitourinary:  [] Chronic kidney disease   [] Difficult urination  [] Anuric   [] Blood in urine Skin:  [] Rashes   [] Ulcers  Psychological:  [] History of anxiety   [x]  History of major depression  []  Memory Difficulties     Objective:   Physical Exam  BP 117/70 (BP Location: Right Arm, Patient Position: Sitting)   Pulse 63   Resp 14   Ht 5\' 5"  (1.651 m)   Wt 162 lb 9.6 oz (73.8 kg)   BMI 27.06 kg/m   Gen: WD/WN, NAD Head: [place]/AT, No temporalis wasting.  Ear/Nose/Throat: Hearing grossly intact, nares w/o erythema or drainage Eyes: PER, EOMI, sclera nonicteric.  Neck: Supple, no masses.  No JVD.  Pulmonary:  Good air movement, no use of accessory muscles.  Cardiac: RRR Vascular:  Right leg slightly cooler than left Vessel Right Left  Radial Palpable Palpable  Dorsalis Pedis  not palpable Palpable  Posterior Tibial  not palpable Palpable   Gastrointestinal: soft, non-distended. No guarding/no peritoneal signs.  Musculoskeletal: M/S 5/5 throughout.  No deformity or  atrophy.  Neurologic: Pain and light touch intact in extremities.  Symmetrical.  Speech is fluent. Motor exam as listed above. Psychiatric: Judgment intact, Mood & affect appropriate for pt's clinical situation. Dermatologic: No Venous rashes. No Ulcers Noted.  No changes consistent with cellulitis. Lymph : No Cervical lymphadenopathy, no lichenification or skin changes of chronic lymphedema.      Assessment & Plan:   1. PAD (peripheral artery disease) ([place]) Patient states that she would not be taking Eliquis past her current prescription due to cost.  Advised the patient that we could utilize more cost effective options, such as Coumadin, however the patient states that she did not want to be subjected to the weekly blood test.  Spoke extensively with the patient regarding the possibilities of what could have been without anticoagulant therapy.  Advised that she could wind up with an ischemic leg once again.  The patient understands and is aware of the risk of not taking anticoagulation medicine.  Once the patient is out of Eliquis she will begin taking aspirin Plavix once again.  Recommend:  The patient has experienced increased symptoms and is now describing lifestyle limiting claudication and mild rest pain.   Given the severity of the patient's right lower extremity symptoms the patient should undergo angiography and intervention.  Risk and benefits were reviewed the patient.  Indications for the procedure were reviewed.  All questions were answered, the patient agrees to proceed.   The patient should continue walking and begin a more formal exercise program.  The patient should continue antiplatelet therapy and aggressive treatment of the lipid abnormalities  The patient will follow up with me after the angiogram.   2. Essential hypertension Continue antihypertensive medications as already ordered, these medications have been reviewed and there are no changes at this time.   3.  Hyperlipidemia, unspecified hyperlipidemia type Continue statin as ordered and reviewed, no changes at this time  Current Outpatient Medications on File Prior to Visit  Medication Sig Dispense Refill  . acetaminophen (TYLENOL) 500 MG tablet Take 1,000 mg by mouth daily.    Marland Kitchen amLODipine (NORVASC) 5 MG tablet Take 10 mg by mouth daily.     Marland Kitchen apixaban (ELIQUIS) 5 MG TABS tablet Take 1 tablet (5 mg total) by mouth 2 (two) times daily. 180 tablet 3  . atorvastatin (LIPITOR) 10 MG tablet Take 1 tablet (10 mg total) by mouth daily. 90 tablet 3  . bisoprolol-hydrochlorothiazide (ZIAC) 10-6.25 MG tablet TAKE 1 TABLET BY MOUTH DAILY (Patient taking differently: Take 1 tablet by mouth daily. ) 30 tablet 0  . DiphenhydrAMINE HCl (ALKA-SELTZER PLUS ALLERGY PO) Take 1 tablet by mouth daily as needed (allergies).    Marland Kitchen levothyroxine (SYNTHROID, LEVOTHROID) 50 MCG tablet TAKE ONE (1) TABLET BY MOUTH EVERY DAY (Patient taking differently: Take 50 mcg by mouth daily. ) 90 tablet 0  . losartan (COZAAR) 100 MG tablet TAKE ONE (1) TABLET BY MOUTH EVERY DAY (Patient taking differently: Take 100 mg by mouth daily. ) 90 tablet 3  . Multiple Vitamins-Minerals (MULTI COMPLETE PO) Take 1 tablet by mouth daily.     . pantoprazole (PROTONIX) 40 MG tablet TAKE ONE (1) TABLET EACH DAY (Patient taking differently: Take 40 mg by mouth daily. ) 90 tablet 3   No current facility-administered medications on file prior to visit.     There are no Patient Instructions on file for this visit. No follow-ups on file.   Kris Hartmann, NP  This note was completed with Sales executive.  Any errors are purely unintentional.

## 2018-11-19 ENCOUNTER — Telehealth (INDEPENDENT_AMBULATORY_CARE_PROVIDER_SITE_OTHER): Payer: Self-pay

## 2018-11-19 ENCOUNTER — Encounter (INDEPENDENT_AMBULATORY_CARE_PROVIDER_SITE_OTHER): Payer: Self-pay

## 2018-11-19 NOTE — Telephone Encounter (Signed)
Spoke with the patient to schedule her for a RLE angio, I offered to put her on the schedule for 12/08/18 because the patient wanted to have her angio after the new year. Patient refused this day and wanted it scheduled further out to the end of the month. The patient is now scheduled for 12/29/18 with a 6:45 am arrival time, this information will also be mailed out to the patient.

## 2018-11-28 ENCOUNTER — Encounter (INDEPENDENT_AMBULATORY_CARE_PROVIDER_SITE_OTHER): Payer: Self-pay

## 2018-11-30 DIAGNOSIS — J019 Acute sinusitis, unspecified: Secondary | ICD-10-CM | POA: Diagnosis not present

## 2018-11-30 DIAGNOSIS — R05 Cough: Secondary | ICD-10-CM | POA: Diagnosis not present

## 2018-12-12 ENCOUNTER — Encounter (INDEPENDENT_AMBULATORY_CARE_PROVIDER_SITE_OTHER): Payer: Self-pay

## 2018-12-23 ENCOUNTER — Encounter (INDEPENDENT_AMBULATORY_CARE_PROVIDER_SITE_OTHER): Payer: Self-pay

## 2018-12-23 ENCOUNTER — Other Ambulatory Visit (INDEPENDENT_AMBULATORY_CARE_PROVIDER_SITE_OTHER): Payer: Self-pay | Admitting: Nurse Practitioner

## 2018-12-23 DIAGNOSIS — Z95820 Peripheral vascular angioplasty status with implants and grafts: Secondary | ICD-10-CM

## 2018-12-23 DIAGNOSIS — M79605 Pain in left leg: Secondary | ICD-10-CM

## 2018-12-23 NOTE — Telephone Encounter (Signed)
The patient previously called Lindsay [name] about swelling in her left leg, not pain and burning.  Let's bring her in for ABIs with myself or Dr. Lucky Cowboy

## 2018-12-23 NOTE — Telephone Encounter (Signed)
Pt was scheduled by [facility]

## 2018-12-24 ENCOUNTER — Ambulatory Visit (INDEPENDENT_AMBULATORY_CARE_PROVIDER_SITE_OTHER): Payer: Medicare HMO | Admitting: Vascular Surgery

## 2018-12-24 ENCOUNTER — Encounter (INDEPENDENT_AMBULATORY_CARE_PROVIDER_SITE_OTHER): Payer: Self-pay

## 2018-12-24 ENCOUNTER — Ambulatory Visit (INDEPENDENT_AMBULATORY_CARE_PROVIDER_SITE_OTHER): Payer: Medicare HMO

## 2018-12-24 ENCOUNTER — Other Ambulatory Visit (INDEPENDENT_AMBULATORY_CARE_PROVIDER_SITE_OTHER): Payer: Self-pay | Admitting: Nurse Practitioner

## 2018-12-24 DIAGNOSIS — Z95820 Peripheral vascular angioplasty status with implants and grafts: Secondary | ICD-10-CM | POA: Diagnosis not present

## 2018-12-24 DIAGNOSIS — M79605 Pain in left leg: Secondary | ICD-10-CM

## 2018-12-24 NOTE — Telephone Encounter (Signed)
Please give Ms. Usery a call and let her know that she should proceed as usual for pre-op appointments.  The only thing that has change is we are going to intervene on the left leg instead of the right.    All the instructions that she received still apply.

## 2018-12-25 ENCOUNTER — Other Ambulatory Visit (INDEPENDENT_AMBULATORY_CARE_PROVIDER_SITE_OTHER): Payer: Self-pay | Admitting: Nurse Practitioner

## 2018-12-26 ENCOUNTER — Encounter
Admission: RE | Admit: 2018-12-26 | Discharge: 2018-12-26 | Disposition: A | Discharge status: Home / Self Care | Payer: Medicare HMO | Source: Ambulatory Visit | Attending: Vascular Surgery | Admitting: Vascular Surgery

## 2018-12-26 DIAGNOSIS — Z880 Allergy status to penicillin: Secondary | ICD-10-CM | POA: Diagnosis not present

## 2018-12-26 DIAGNOSIS — Z886 Allergy status to analgesic agent status: Secondary | ICD-10-CM | POA: Diagnosis not present

## 2018-12-26 DIAGNOSIS — I739 Peripheral vascular disease, unspecified: Secondary | ICD-10-CM | POA: Diagnosis present

## 2018-12-26 DIAGNOSIS — F329 Major depressive disorder, single episode, unspecified: Secondary | ICD-10-CM | POA: Diagnosis not present

## 2018-12-26 DIAGNOSIS — Z01812 Encounter for preprocedural laboratory examination: Secondary | ICD-10-CM | POA: Insufficient documentation

## 2018-12-26 DIAGNOSIS — I10 Essential (primary) hypertension: Secondary | ICD-10-CM | POA: Diagnosis not present

## 2018-12-26 DIAGNOSIS — Z8249 Family history of ischemic heart disease and other diseases of the circulatory system: Secondary | ICD-10-CM | POA: Diagnosis not present

## 2018-12-26 DIAGNOSIS — Z87891 Personal history of nicotine dependence: Secondary | ICD-10-CM | POA: Diagnosis not present

## 2018-12-26 DIAGNOSIS — R69 Illness, unspecified: Secondary | ICD-10-CM | POA: Diagnosis not present

## 2018-12-26 DIAGNOSIS — K219 Gastro-esophageal reflux disease without esophagitis: Secondary | ICD-10-CM | POA: Diagnosis not present

## 2018-12-26 DIAGNOSIS — M7989 Other specified soft tissue disorders: Secondary | ICD-10-CM | POA: Diagnosis not present

## 2018-12-26 DIAGNOSIS — E039 Hypothyroidism, unspecified: Secondary | ICD-10-CM | POA: Diagnosis not present

## 2018-12-26 DIAGNOSIS — M1990 Unspecified osteoarthritis, unspecified site: Secondary | ICD-10-CM | POA: Diagnosis not present

## 2018-12-26 DIAGNOSIS — F419 Anxiety disorder, unspecified: Secondary | ICD-10-CM | POA: Diagnosis not present

## 2018-12-26 LAB — BUN: BUN: 15 mg/dL (ref 8–23)

## 2018-12-26 LAB — CREATININE, SERUM
Creatinine, Ser: 0.65 mg/dL (ref 0.44–1.00)
GFR calc Af Amer: >60 mL/min (ref >60)
GFR calc non Af Amer: >60 mL/min (ref >60)

## 2018-12-26 MED ORDER — CLINDAMYCIN PHOSPHATE 300 MG/50ML IV SOLN: 300.0000 mg | Freq: Once | INTRAVENOUS | Status: DC

## 2018-12-28 MED ORDER — CLINDAMYCIN PHOSPHATE 300 MG/50ML IV SOLN: 300.0000 mg | Freq: Once | INTRAVENOUS | Status: DC

## 2018-12-29 ENCOUNTER — Encounter
Admission: RE | Disposition: A | Discharge status: Home / Self Care | Payer: Self-pay | Source: Ambulatory Visit | Attending: Vascular Surgery

## 2018-12-29 ENCOUNTER — Encounter: Payer: Self-pay | Admitting: Vascular Surgery

## 2018-12-29 ENCOUNTER — Ambulatory Visit
Admission: RE | Admit: 2018-12-29 | Discharge: 2018-12-29 | Disposition: A | Discharge status: Home / Self Care | Payer: Medicare HMO | Source: Ambulatory Visit | Attending: Vascular Surgery | Admitting: Vascular Surgery

## 2018-12-29 DIAGNOSIS — K219 Gastro-esophageal reflux disease without esophagitis: Secondary | ICD-10-CM | POA: Insufficient documentation

## 2018-12-29 DIAGNOSIS — M1990 Unspecified osteoarthritis, unspecified site: Secondary | ICD-10-CM | POA: Insufficient documentation

## 2018-12-29 DIAGNOSIS — F419 Anxiety disorder, unspecified: Secondary | ICD-10-CM | POA: Insufficient documentation

## 2018-12-29 DIAGNOSIS — I739 Peripheral vascular disease, unspecified: Secondary | ICD-10-CM | POA: Diagnosis not present

## 2018-12-29 DIAGNOSIS — I70229 Atherosclerosis of native arteries of extremities with rest pain, unspecified extremity: Secondary | ICD-10-CM

## 2018-12-29 DIAGNOSIS — F329 Major depressive disorder, single episode, unspecified: Secondary | ICD-10-CM | POA: Insufficient documentation

## 2018-12-29 DIAGNOSIS — I10 Essential (primary) hypertension: Secondary | ICD-10-CM | POA: Insufficient documentation

## 2018-12-29 DIAGNOSIS — Z8249 Family history of ischemic heart disease and other diseases of the circulatory system: Secondary | ICD-10-CM | POA: Insufficient documentation

## 2018-12-29 DIAGNOSIS — Z95820 Peripheral vascular angioplasty status with implants and grafts: Secondary | ICD-10-CM | POA: Diagnosis not present

## 2018-12-29 DIAGNOSIS — M7989 Other specified soft tissue disorders: Secondary | ICD-10-CM | POA: Diagnosis not present

## 2018-12-29 DIAGNOSIS — Z880 Allergy status to penicillin: Secondary | ICD-10-CM | POA: Insufficient documentation

## 2018-12-29 DIAGNOSIS — R69 Illness, unspecified: Secondary | ICD-10-CM | POA: Diagnosis not present

## 2018-12-29 DIAGNOSIS — Z87891 Personal history of nicotine dependence: Secondary | ICD-10-CM | POA: Insufficient documentation

## 2018-12-29 DIAGNOSIS — E039 Hypothyroidism, unspecified: Secondary | ICD-10-CM | POA: Diagnosis not present

## 2018-12-29 DIAGNOSIS — I70223 Atherosclerosis of native arteries of extremities with rest pain, bilateral legs: Secondary | ICD-10-CM | POA: Diagnosis not present

## 2018-12-29 DIAGNOSIS — Z9889 Other specified postprocedural states: Secondary | ICD-10-CM | POA: Diagnosis not present

## 2018-12-29 DIAGNOSIS — R600 Localized edema: Secondary | ICD-10-CM | POA: Diagnosis not present

## 2018-12-29 DIAGNOSIS — Z886 Allergy status to analgesic agent status: Secondary | ICD-10-CM | POA: Insufficient documentation

## 2018-12-29 HISTORY — PX: LOWER EXTREMITY ANGIOGRAPHY: CATH118251

## 2018-12-29 SURGERY — LOWER EXTREMITY ANGIOGRAPHY
Anesthesia: Moderate Sedation | Site: Leg Lower | Laterality: Left

## 2018-12-29 MED ORDER — HYDRALAZINE HCL 20 MG/ML IJ SOLN: 5.0000 mg | INTRAMUSCULAR | Status: DC | PRN

## 2018-12-29 MED ORDER — ACETAMINOPHEN 325 MG PO TABS: 650.0000 mg | ORAL_TABLET | ORAL | Status: DC | PRN

## 2018-12-29 MED ORDER — HEPARIN SODIUM (PORCINE) 1000 UNIT/ML IJ SOLN
INTRAMUSCULAR | Status: AC
  Filled 2018-12-29: qty 1

## 2018-12-29 MED ORDER — HYDROMORPHONE HCL 1 MG/ML IJ SOLN: 1.0000 mg | Freq: Once | INTRAMUSCULAR | Status: DC | PRN

## 2018-12-29 MED ORDER — IOPAMIDOL (ISOVUE-300) INJECTION 61%
INTRAVENOUS | Status: DC | PRN
  Administered 2018-12-29: 40 mL via INTRA_ARTERIAL

## 2018-12-29 MED ORDER — CLINDAMYCIN PHOSPHATE 300 MG/50ML IV SOLN
INTRAVENOUS | Status: AC
  Administered 2018-12-29: 300 mg
  Filled 2018-12-29: qty 50

## 2018-12-29 MED ORDER — MIDAZOLAM HCL 2 MG/ML PO SYRP: 8.0000 mg | ORAL_SOLUTION | Freq: Once | ORAL | Status: DC | PRN

## 2018-12-29 MED ORDER — LIDOCAINE-EPINEPHRINE (PF) 1 %-1:200000 IJ SOLN
INTRAMUSCULAR | Status: AC
  Filled 2018-12-29: qty 30

## 2018-12-29 MED ORDER — MIDAZOLAM HCL 5 MG/5ML IJ SOLN
INTRAMUSCULAR | Status: AC
  Filled 2018-12-29: qty 5

## 2018-12-29 MED ORDER — MIDAZOLAM HCL 2 MG/2ML IJ SOLN
INTRAMUSCULAR | Status: DC | PRN
  Administered 2018-12-29: 2 mg via INTRAVENOUS
  Administered 2018-12-29 (×2): 0.5 mg via INTRAVENOUS

## 2018-12-29 MED ORDER — FENTANYL CITRATE (PF) 100 MCG/2ML IJ SOLN
INTRAMUSCULAR | Status: DC | PRN
  Administered 2018-12-29: 25 ug via INTRAVENOUS
  Administered 2018-12-29: 50 ug via INTRAVENOUS

## 2018-12-29 MED ORDER — ONDANSETRON HCL 4 MG/2ML IJ SOLN: 4.0000 mg | Freq: Four times a day (QID) | INTRAMUSCULAR | Status: DC | PRN

## 2018-12-29 MED ORDER — METHYLPREDNISOLONE SODIUM SUCC 125 MG IJ SOLR: 125.0000 mg | Freq: Once | INTRAMUSCULAR | Status: DC | PRN

## 2018-12-29 MED ORDER — LIDOCAINE HCL (PF) 1 % IJ SOLN
INTRAMUSCULAR | Status: DC | PRN
  Administered 2018-12-29: 20 mL

## 2018-12-29 MED ORDER — SODIUM CHLORIDE 0.9% FLUSH: 3.0000 mL | Freq: Two times a day (BID) | INTRAVENOUS | Status: DC

## 2018-12-29 MED ORDER — DIPHENHYDRAMINE HCL 50 MG/ML IJ SOLN: 50.0000 mg | Freq: Once | INTRAMUSCULAR | Status: DC | PRN

## 2018-12-29 MED ORDER — HEPARIN (PORCINE) IN NACL 1000-0.9 UT/500ML-% IV SOLN
INTRAVENOUS | Status: AC
  Filled 2018-12-29: qty 1000

## 2018-12-29 MED ORDER — SODIUM CHLORIDE 0.9 % IV SOLN: 250.0000 mL | INTRAVENOUS | Status: DC | PRN

## 2018-12-29 MED ORDER — FENTANYL CITRATE (PF) 100 MCG/2ML IJ SOLN
INTRAMUSCULAR | Status: AC
  Filled 2018-12-29: qty 2

## 2018-12-29 MED ORDER — SODIUM CHLORIDE 0.9 % IV SOLN
INTRAVENOUS | Status: DC
  Administered 2018-12-29: 08:00:00 via INTRAVENOUS

## 2018-12-29 MED ORDER — LABETALOL HCL 5 MG/ML IV SOLN: 10.0000 mg | INTRAVENOUS | Status: DC | PRN

## 2018-12-29 MED ORDER — SODIUM CHLORIDE 0.9 % IV SOLN: INTRAVENOUS | Status: DC

## 2018-12-29 MED ORDER — FAMOTIDINE 20 MG PO TABS: 40.0000 mg | ORAL_TABLET | Freq: Once | ORAL | Status: DC | PRN

## 2018-12-29 MED ORDER — SODIUM CHLORIDE 0.9% FLUSH: 3.0000 mL | INTRAVENOUS | Status: DC | PRN

## 2018-12-29 MED ORDER — HEPARIN SODIUM (PORCINE) 1000 UNIT/ML IJ SOLN
INTRAMUSCULAR | Status: DC | PRN
  Administered 2018-12-29: 4000 [IU] via INTRAVENOUS

## 2018-12-29 SURGICAL SUPPLY — 10 items
BALLN LUTONIX DCB 6X40X130 (BALLOONS) ×2
BALLOON LUTONIX DCB 6X40X130 (BALLOONS) ×1 IMPLANT
CATH PIG 70CM (CATHETERS) ×2 IMPLANT
DEVICE PRESTO INFLATION (MISCELLANEOUS) ×2 IMPLANT
DEVICE STARCLOSE SE CLOSURE (Vascular Products) ×2 IMPLANT
PACK ANGIOGRAPHY (CUSTOM PROCEDURE TRAY) ×2 IMPLANT
SHEATH BRITE TIP 5FRX11 (SHEATH) ×2 IMPLANT
TUBING CONTRAST HIGH PRESS 72 (TUBING) ×2 IMPLANT
WIRE J 3MM .035X145CM (WIRE) ×2 IMPLANT
WIRE MAGIC TOR.035 180C (WIRE) ×2 IMPLANT

## 2018-12-29 NOTE — Op Note (Signed)
[facility]  Percutaneous Study/Intervention Procedural Note   Date of Surgery: 12/29/2018  Surgeon(s):Izumi Mixon    Assistants:none  Pre-operative Diagnosis: PAD with rest pain and persistent left lower extremity pain after previous revascularization.  Persistent pain in the right leg which is unchanged.  Post-operative diagnosis:  Same  Procedure(s) Performed:             1.  Ultrasound guidance for vascular access right femoral artery             2.  Catheter placement into left SFA from right femoral approach             3.  Aortogram and selective left lower extremity angiogram             4.  Percutaneous transluminal angioplasty of right common iliac artery with 6 mm diameter by 4 cm length Lutonix drug-coated angioplasty balloon             5.  StarClose closure device right femoral artery  EBL: 3 cc  Contrast: 40 cc  Fluoro Time: 3 minutes  Moderate Conscious Sedation Time: approximately 30 minutes using 3 mg of Versed and 75 mcg of Fentanyl              Indications:  Patient is a 78 y.o.female with a known history of severe PAD. The patient has undergone 2 left lower extremity revascularizations the most recent about 2 months ago.  She still complains of significant pain in that left leg and even though she has known severe peripheral arterial disease on the right and was originally scheduled to have the right leg treated, she requested that we reevaluate the left leg with angiogram today.  The patient is brought in for angiography for further evaluation and potential treatment.  Due to the limb threatening nature of the situation, angiogram was performed for attempted limb salvage. The patient is aware that if the procedure fails, amputation would be expected.  The patient also understands that even with successful revascularization, amputation may still be required due to the severity of the situation.  Risks and benefits are discussed and informed  consent is obtained.   Procedure:  The patient was identified and appropriate procedural time out was performed.  The patient was then placed supine on the table and prepped and draped in the usual sterile fashion. Moderate conscious sedation was administered during a face to face encounter with the patient throughout the procedure with my supervision of the RN administering medicines and monitoring the patient's vital signs, pulse oximetry, telemetry and mental status throughout from the start of the procedure until the patient was taken to the recovery room. Ultrasound was used to evaluate the right common femoral artery.  It was patent .  A digital ultrasound image was acquired.  A Seldinger needle was used to access the right common femoral artery under direct ultrasound guidance and a permanent image was performed.  A 0.035 J wire was advanced without resistance and a 5Fr sheath was placed.  Pigtail catheter was placed into the aorta and an AP aortogram was performed.  To further evaluate iliac arteries, pelvic obliques were also performed after the pigtail catheter was pulled down to the distal aorta.  This demonstrated normal renal arteries bilaterally.  The aorta had mild irregularity and calcification with no hemodynamically significant stenosis.  On pelvic oblique views, the right common iliac artery had a stenosis in the 50% range that looked a little worse than it  had previously.  The left common iliac artery had stenosis in the 20 to 30% range.  The external iliac arteries were normal bilaterally. I then crossed the aortic bifurcation and advanced to the left femoral head.  To further evaluate the distal perfusion better, I advanced the pigtail catheter down into the mid left SFA over the J-wire for imaging. Selective left lower extremity angiogram was then performed. This demonstrated normal common femoral artery, profunda femoris artery, and SFA and popliteal arteries with stents that were widely  patent.  There was less than 20% stenosis in the popliteal artery below the stent.  There was then two-vessel runoff with the posterior tibial and peroneal arteries distally. It was felt that it was in the patient's best interest to perform balloon angioplasty on the right common iliac artery today.  This leg did have significantly reduced perfusion and although infrainguinal disease was present, this may improve her symptoms somewhat.  It was in a location where I would try to avoid a stent placement today as it may make up and over treatment more difficult coming back later today.  The patient was given 4000 units of intravenous heparin.  Over a Magic torque wire, I gently inflated a 6 mm diameter by 4 cm length Lutonix drug-coated angioplasty balloon in the right common iliac artery up to 10 atm for 1 minute.  Completion imaging showed about a 30% residual stenosis that did appear improved. I elected to terminate the procedure. The sheath was removed and StarClose closure device was deployed in the right femoral artery with excellent hemostatic result. The patient was taken to the recovery room in stable condition having tolerated the procedure well.  Findings:               Aortogram:  Normal renal arteries bilaterally.  The aorta had mild irregularity and calcification with no hemodynamically significant stenosis.  On pelvic oblique views, the right common iliac artery had a stenosis in the 50% range that looked a little worse than it had previously.  The left common iliac artery had stenosis in the 20 to 30% range.  The external iliac arteries were normal bilaterally.             Left lower Extremity:  This demonstrated normal common femoral artery, profunda femoris artery, and SFA and popliteal arteries with stents that were widely patent.  There was less than 20% stenosis in the popliteal artery below the stent.  There was then two-vessel runoff with the posterior tibial and peroneal arteries  distally   Disposition: Patient was taken to the recovery room in stable condition having tolerated the procedure well.  Complications: None  Leotis Pain 12/29/2018 9:01 AM   This note was created with Dragon Medical transcription system. Any errors in dictation are purely unintentional.

## 2018-12-29 NOTE — Discharge Instructions (Signed)
Groin Insertion Instructions-If you lose feeling or develop tingling or pain in your leg or foot after the procedure, please walk around first.  If the discomfort does not improve , contact your physician and proceed to the nearest emergency room.  Loss of feeling in your leg might mean that a blockage has formed in the artery and this can be appropriately treated.  Limit your activity for the next two days after your procedure.  Avoid stooping, bending, heavy lifting or exertion as this may put pressure on the insertion site.  Resume normal activities in 48 hours.  You may shower after 24 hours but avoid excessive warm water and do not scrub the site.  Remove clear dressing in 48 hours.  If you have had a closure device inserted, do not soak in a tub bath or a hot tub for at least one week. ° °No driving for 48 hours after discharge.  After the procedure, check the insertion site occasionally.  If any oozing occurs or there is apparent swelling, firm pressure over the site will prevent a bruise from forming.  You can not hurt anything by pressing directly on the site.  The pressure stops the bleeding by allowing a small clot to form.  If the bleeding continues after the pressure has been applied for more than 15 minutes, call 911 or go to the nearest emergency room.   ° °The x-ray dye causes you to pass a considerate amount of urine.  For this reason, you will be asked to drink plenty of liquids after the procedure to prevent dehydration.  You may resume you regular diet.  Avoid caffeine products.   ° °For pain at the site of your procedure, take non-aspirin medicines such as Tylenol. ° °Medications: A. Hold Metformin for 48 hours if applicable.  B. Continue taking all your present medications at home unless your doctor prescribes any changes ° ° °Angiogram, Care After °This sheet gives you information about how to care for yourself after your procedure. Your health care provider may also give you more specific  instructions. If you have problems or questions, contact your health care provider. °What can I expect after the procedure? °After the procedure, it is common to have bruising and tenderness at the catheter insertion area. °Follow these instructions at home: °Insertion site care °· Follow instructions from your health care provider about how to take care of your insertion site. Make sure you: °? Wash your hands with soap and water before you change your bandage (dressing). If soap and water are not available, use hand sanitizer. °? Change your dressing as told by your health care provider. °? Leave stitches (sutures), skin glue, or adhesive strips in place. These skin closures may need to stay in place for 2 weeks or longer. If adhesive strip edges start to loosen and curl up, you may trim the loose edges. Do not remove adhesive strips completely unless your health care provider tells you to do that. °· Do not take baths, swim, or use a hot tub until your health care provider approves. °· You may shower 24-48 hours after the procedure or as told by your health care provider. °? Gently wash the site with plain soap and water. °? Pat the area dry with a clean towel. °? Do not rub the site. This may cause bleeding. °· Do not apply powder or lotion to the site. Keep the site clean and dry. °· Check your insertion site every day for signs of   infection. Check for: °? Redness, swelling, or pain. °? Fluid or blood. °? Warmth. °? Pus or a bad smell. °Activity °· Rest as told by your health care provider, usually for 1-2 days. °· Do not lift anything that is heavier than 10 lbs. (4.5 kg) or as told by your health care provider. °· Do not drive for 24 hours if you were given a medicine to help you relax (sedative). °· Do not drive or use heavy machinery while taking prescription pain medicine. °General instructions ° °· Return to your normal activities as told by your health care provider, usually in about a week. Ask your  health care provider what activities are safe for you. °· If the catheter site starts bleeding, lie flat and put pressure on the site. If the bleeding does not stop, get help right away. This is a medical emergency. °· Drink enough fluid to keep your urine clear or pale yellow. This helps flush the contrast dye from your body. °· Take over-the-counter and prescription medicines only as told by your health care provider. °· Keep all follow-up visits as told by your health care provider. This is important. °Contact a health care provider if: °· You have a fever or chills. °· You have redness, swelling, or pain around your insertion site. °· You have fluid or blood coming from your insertion site. °· The insertion site feels warm to the touch. °· You have pus or a bad smell coming from your insertion site. °· You have bruising around the insertion site. °· You notice blood collecting in the tissue around the catheter site (hematoma). The hematoma may be painful to the touch. °Get help right away if: °· You have severe pain at the catheter insertion area. °· The catheter insertion area swells very fast. °· The catheter insertion area is bleeding, and the bleeding does not stop when you hold steady pressure on the area. °· The area near or just beyond the catheter insertion site becomes pale, cool, tingly, or numb. °These symptoms may represent a serious problem that is an emergency. Do not wait to see if the symptoms will go away. Get medical help right away. Call your local emergency services (911 in the [place] Do not drive yourself to the hospital. °Summary °· After the procedure, it is common to have bruising and tenderness at the catheter insertion area. °· After the procedure, it is important to rest and drink plenty of fluids. °· Do not take baths, swim, or use a hot tub until your health care provider says it is okay to do so. You may shower 24-48 hours after the procedure or as told by your health care  provider. °· If the catheter site starts bleeding, lie flat and put pressure on the site. If the bleeding does not stop, get help right away. This is a medical emergency. °This information is not intended to replace advice given to you by your health care provider. Make sure you discuss any questions you have with your health care provider. °Document Released: 06/07/2005 Document Revised: 10/24/2016 Document Reviewed: 10/24/2016 °Elsevier Interactive Patient Education © 2019 [employer] ° °

## 2018-12-29 NOTE — H&P (Signed)
[facility] Admission History & Physical  MRN : [id number]  Lindsay [name] is a 78 y.o. (1941-08-30) female who presents with chief complaint of No chief complaint on file. Marland Kitchen  History of Present Illness: Patient presents today for treatment of peripheral arterial disease.  She has known severe peripheral arterial disease bilaterally.  Several weeks ago, she underwent extensive left lower extremity revascularization.  Her ABIs did come up some but her waveforms are still not normal and she complains of continued left leg pain.  She desires to have this evaluated before she treats the right leg.  No open ulcerations or infection.  No fevers or chills.  Current Facility-Administered Medications  Medication Dose Route Frequency Provider Last Rate Last Dose  . 0.9 %  sodium chloride infusion   Intravenous Continuous Kris Hartmann, NP 75 mL/hr at 12/29/18 0739    . clindamycin (CLEOCIN) 300 MG/50ML IVPB           . clindamycin (CLEOCIN) IVPB 300 mg  300 mg Intravenous Once Algernon Huxley, MD      . diphenhydrAMINE (BENADRYL) injection 50 mg  50 mg Intravenous Once PRN Kris Hartmann, NP      . famotidine (PEPCID) tablet 40 mg  40 mg Oral Once PRN Kris Hartmann, NP      . HYDROmorphone (DILAUDID) injection 1 mg  1 mg Intravenous Once PRN Eulogio Ditch E, NP      . methylPREDNISolone sodium succinate (SOLU-MEDROL) 125 mg/2 mL injection 125 mg  125 mg Intravenous Once PRN Eulogio Ditch E, NP      . midazolam (VERSED) 2 MG/ML syrup 8 mg  8 mg Oral Once PRN Kris Hartmann, NP      . ondansetron (ZOFRAN) injection 4 mg  4 mg Intravenous Q6H PRN Kris Hartmann, NP        Past Medical History:  Diagnosis Date  . Anemia   . Anxiety   . Arthritis    osteo, back  . Depression   . Diverticulosis   . GERD (gastroesophageal reflux disease)   . Hematochezia   . Hypertension    controlled on meds  . Hypothyroidism   . PAD (peripheral artery disease) ([place])     Past  Surgical History:  Procedure Laterality Date  . ABDOMINAL HYSTERECTOMY    . CARDIAC CATHETERIZATION  1995   normal, Dr Clayborn Bigness  . COLONOSCOPY  12/29/08  . COLONOSCOPY N/A 04/18/2015   Procedure: COLONOSCOPY;  Surgeon: Lucilla Lame, MD;  Location: [place];  Service: Gastroenterology;  Laterality: N/A;  cecum time- 0957  . FOOT SURGERY Right   . KNEE ARTHROSCOPY Right 10/05/2015   Procedure: ARTHROSCOPY KNEE, partial lateral menisectomy;  Surgeon: Earnestine Leys, MD;  Location: [facility];  Service: Orthopedics;  Laterality: Right;  . LOWER EXTREMITY ANGIOGRAPHY Left 02/27/2018   Procedure: LOWER EXTREMITY ANGIOGRAPHY;  Surgeon: Algernon Huxley, MD;  Location: [place] [place] [facility];  Service: Cardiovascular;  Laterality: Left;  . LOWER EXTREMITY ANGIOGRAPHY Left 10/15/2018   Procedure: LOWER EXTREMITY ANGIOGRAPHY;  Surgeon: Algernon Huxley, MD;  Location: [place] [facility];  Service: Cardiovascular;  Laterality: Left;  . POLYPECTOMY  04/18/2015   Procedure: POLYPECTOMY INTESTINAL;  Surgeon: Lucilla Lame, MD;  Location: [place];  Service: Gastroenterology;;  . WRIST SURGERY Right     Social History Social History   Tobacco Use  . Smoking status: Former Smoker    Packs/day: 0.50  Years: 30.00    Pack years: 15.00    Types: Cigarettes    Last attempt to quit: 10/14/2018    Years since quitting: 0.2  . Smokeless tobacco: Never Used  Substance Use Topics  . Alcohol use: Yes    Alcohol/week: 0.0 standard drinks    Comment: occassionally/ whiskey on weekend; total of 4 drinks per week  . Drug use: No    Family History Family History  Problem Relation Age of Onset  . Diabetes Mother   . Hypertension Mother   . Diabetes Sister   . Hypertension Sister     Allergies  Allergen Reactions  . Aspirin Other (See Comments)    GI BLEED  . Nsaids Other (See Comments)    GI BLEED  . Penicillins Itching, Rash and Other (See Comments)    Has patient had a PCN reaction  causing immediate rash, facial/tongue/throat swelling, SOB or lightheadedness with hypotension: Yes Has patient had a PCN reaction causing severe rash involving mucus membranes or skin necrosis: No Has patient had a PCN reaction that required hospitalization No Has patient had a PCN reaction occurring within the last 10 years: No If all of the above answers are "NO", then may proceed with Cephalosporin use.   . Pollen Extract Other (See Comments)    Congestion, Eye Irritation     REVIEW OF SYSTEMS (Negative unless checked)  Constitutional: [] Weight loss  [] Fever  [] Chills Cardiac: [] Chest pain   [] Chest pressure   [] Palpitations   [] Shortness of breath when laying flat   [] Shortness of breath at rest   [] Shortness of breath with exertion. Vascular:  [x] Pain in legs with walking   [x] Pain in legs at rest   [] Pain in legs when laying flat   [] Claudication   [] Pain in feet when walking  [] Pain in feet at rest  [] Pain in feet when laying flat   [] History of DVT   [] Phlebitis   [x] Swelling in legs   [] Varicose veins   [] Non-healing ulcers Pulmonary:   [] Uses home oxygen   [] Productive cough   [] Hemoptysis   [] Wheeze  [] COPD   [] Asthma Neurologic:  [] Dizziness  [] Blackouts   [] Seizures   [] History of stroke   [] History of TIA  [] Aphasia   [] Temporary blindness   [] Dysphagia   [] Weakness or numbness in arms   [] Weakness or numbness in legs Musculoskeletal:  [x] Arthritis   [] Joint swelling   [] Joint pain   [] Low back pain Hematologic:  [] Easy bruising  [] Easy bleeding   [] Hypercoagulable state   [x] Anemic  [] Hepatitis Gastrointestinal:  [] Blood in stool   [] Vomiting blood  [x] Gastroesophageal reflux/heartburn   [] Difficulty swallowing. Genitourinary:  [] Chronic kidney disease   [] Difficult urination  [] Frequent urination  [] Burning with urination   [] Blood in urine Skin:  [] Rashes   [] Ulcers   [] Wounds Psychological:  [x] History of anxiety   []  History of major depression.  Physical  Examination  Vitals:   12/29/18 0716  BP: (!) 165/80  Pulse: (!) 58  Resp: 16  Temp: 98.2 F (36.8 C)  TempSrc: Oral  SpO2: 100%  Weight: 74.8 kg  Height: 5\' 5"  (1.651 m)   Body mass index is 27.46 kg/m. Gen: WD/WN, NAD Head: [place]/AT, No temporalis wasting.  Ear/Nose/Throat: Hearing grossly intact, nares w/o erythema or drainage, oropharynx w/o Erythema/Exudate,  Eyes: Conjunctiva clear, sclera non-icteric Neck: Trachea midline.   Pulmonary:  Good air movement, respirations not labored, no use of accessory muscles.  Cardiac: RRR, no JVD Vascular:  Vessel  Right Left  Radial Palpable Palpable                                    Musculoskeletal: M/S 5/5 throughout.  No deformity or atrophy.  Neurologic: Sensation grossly intact in extremities.  Symmetrical.  Speech is fluent. Motor exam as listed above. Psychiatric: Judgment intact, Mood & affect appropriate for pt's clinical situation. Dermatologic: No rashes or ulcers noted.  No cellulitis or open wounds.      CBC Lab Results  Component Value Date   WBC 8.6 10/16/2018   HGB 11.9 (L) 10/16/2018   HCT 34.1 (L) 10/16/2018   MCV 87.9 10/16/2018   PLT 197 10/16/2018    BMET    Component Value Date/Time   NA 143 10/15/2018 0045   NA 145 03/20/2015 0500   K 3.4 (L) 10/15/2018 0045   K 3.9 03/20/2015 0500   CL 110 10/15/2018 0045   CL 117 (H) 03/20/2015 0500   CO2 25 10/15/2018 0045   CO2 27 03/20/2015 0500   GLUCOSE 110 (H) 10/15/2018 0045   GLUCOSE 122 (H) 03/20/2015 0500   BUN 15 12/26/2018 1011   BUN 11 03/20/2015 0500   CREATININE 0.65 12/26/2018 1011   CREATININE 0.63 03/20/2015 0500   CALCIUM 8.7 (L) 10/15/2018 0045   CALCIUM 7.8 (L) 03/20/2015 0500   GFRNONAA >60 12/26/2018 1011   GFRNONAA >60 03/20/2015 0500   GFRAA >60 12/26/2018 1011   GFRAA >60 03/20/2015 0500   Estimated Creatinine Clearance: 61.5 mL/min (by C-G formula based on SCr of 0.65 mg/dL).  COAG Lab Results  Component Value  Date   INR 0.97 10/14/2018   INR 0.93 02/09/2017    Radiology Vas [place] Burnard Bunting With/wo Tbi  Result Date: 12/26/2018 LOWER EXTREMITY DOPPLER STUDY Indications: Peripheral artery disease, and Peripheral Vascular Disease              Recent sudden ischemic issues in left lower extremity, 10/15/2018              revascularization left with PTA and SFA to pop covered stent.  Vascular Interventions: On 02/27/2018 PTA of the left ATA angioplasty/balloon.                         PTA left SFA & proximal popliteal artery with                         angioplasty/balloon. Stent placment to the left SFA *                         proximal popliteal artery. Thrombolytic therapy of the                         left tiobperoneal trunk and poximal posterior tibial                         artery. Mechanical thrombectomy left tibioperoneal trunk                         and pox posterior tibial artery. PTA left tibioperoneal                         trunk and pox posterior tibial artery  angioplasty/balloon. Comparison Study: 11/17/2018 Performing Technologist: Concha Norway RVT  Examination Guidelines: A complete evaluation includes at minimum, Doppler waveform signals and systolic blood pressure reading at the level of bilateral brachial, anterior tibial, and posterior tibial arteries, when vessel segments are accessible. Bilateral testing is considered an integral part of a complete examination. Photoelectric Plethysmograph (PPG) waveforms and toe systolic pressure readings are included as required and additional duplex testing as needed. Limited examinations for reoccurring indications may be performed as noted.  ABI Findings: +--------+------------------+-----+----------+--------+ Right   Rt Pressure (mmHg)IndexWaveform  Comment  +--------+------------------+-----+----------+--------+ [id number]                                       +--------+------------------+-----+----------+--------+ ATA      100               0.60 monophasic         +--------+------------------+-----+----------+--------+ PTA     102               0.61 monophasic         +--------+------------------+-----+----------+--------+ +--------+------------------+-----+--------+-------+ Left    Lt Pressure (mmHg)IndexWaveformComment +--------+------------------+-----+--------+-------+ [id number]                                    +--------+------------------+-----+--------+-------+ ATA     151               0.90 biphasic        +--------+------------------+-----+--------+-------+ PTA     168               1.01 biphasic        +--------+------------------+-----+--------+-------+ +-------+-----------+-----------+------------+------------+ ABI/TBIToday's ABIToday's TBIPrevious ABIPrevious TBI +-------+-----------+-----------+------------+------------+ Right  .61                   .51                      +-------+-----------+-----------+------------+------------+ Left   1.01                  .91                      +-------+-----------+-----------+------------+------------+ Left ABIs appear increased compared to prior study on 11/17/2018.  Summary: Right: Resting right ankle-brachial index indicates moderate right lower extremity arterial disease. Left: Resting left ankle-brachial index is within normal range. No evidence of significant left lower extremity arterial disease.  *See table(s) above for measurements and observations.  Electronically signed by Leotis Pain MD on 12/26/2018 at 12:41:03 PM.    Final      Assessment/Plan 1.  Significant PAD bilateral lower extremities.  She is status post intervention to her left leg several weeks ago with persistent pain.  We are repeating an angiogram of the left leg before considering intervention on the right leg to evaluate perfusion.  Risks and benefits are discussed.  Informed consent is obtained.  She will likely still need a right leg  intervention at some point going forward. 2.  Left lower extremity swelling.  Likely reperfusion syndrome. 3.  Hypertension.  Stable on outpatient medications and blood pressure control important in reducing the progression of atherosclerotic disease. On appropriate oral medications.    Leotis Pain, MD  12/29/2018 8:13 AM

## 2019-01-14 DIAGNOSIS — I10 Essential (primary) hypertension: Secondary | ICD-10-CM | POA: Diagnosis not present

## 2019-01-20 ENCOUNTER — Other Ambulatory Visit (INDEPENDENT_AMBULATORY_CARE_PROVIDER_SITE_OTHER): Payer: Self-pay | Admitting: Vascular Surgery

## 2019-01-20 DIAGNOSIS — Z9862 Peripheral vascular angioplasty status: Secondary | ICD-10-CM

## 2019-01-21 DIAGNOSIS — K219 Gastro-esophageal reflux disease without esophagitis: Secondary | ICD-10-CM | POA: Diagnosis not present

## 2019-01-21 DIAGNOSIS — Z0000 Encounter for general adult medical examination without abnormal findings: Secondary | ICD-10-CM | POA: Diagnosis not present

## 2019-01-21 DIAGNOSIS — Z23 Encounter for immunization: Secondary | ICD-10-CM | POA: Diagnosis not present

## 2019-01-21 DIAGNOSIS — Z1239 Encounter for other screening for malignant neoplasm of breast: Secondary | ICD-10-CM | POA: Diagnosis not present

## 2019-01-21 DIAGNOSIS — E039 Hypothyroidism, unspecified: Secondary | ICD-10-CM | POA: Diagnosis not present

## 2019-01-21 DIAGNOSIS — I10 Essential (primary) hypertension: Secondary | ICD-10-CM | POA: Diagnosis not present

## 2019-01-21 DIAGNOSIS — I739 Peripheral vascular disease, unspecified: Secondary | ICD-10-CM | POA: Diagnosis not present

## 2019-01-26 ENCOUNTER — Other Ambulatory Visit: Payer: Self-pay

## 2019-01-26 ENCOUNTER — Ambulatory Visit (INDEPENDENT_AMBULATORY_CARE_PROVIDER_SITE_OTHER): Payer: Medicare HMO

## 2019-01-26 ENCOUNTER — Encounter (INDEPENDENT_AMBULATORY_CARE_PROVIDER_SITE_OTHER): Payer: Self-pay | Admitting: Nurse Practitioner

## 2019-01-26 ENCOUNTER — Ambulatory Visit (INDEPENDENT_AMBULATORY_CARE_PROVIDER_SITE_OTHER): Payer: Medicare HMO | Admitting: Nurse Practitioner

## 2019-01-26 VITALS — BP 164/80 | HR 66 | Resp 10 | Ht 65.0 in | Wt 163.0 lb

## 2019-01-26 DIAGNOSIS — I739 Peripheral vascular disease, unspecified: Secondary | ICD-10-CM

## 2019-01-26 DIAGNOSIS — E785 Hyperlipidemia, unspecified: Secondary | ICD-10-CM | POA: Diagnosis not present

## 2019-01-26 DIAGNOSIS — F17211 Nicotine dependence, cigarettes, in remission: Secondary | ICD-10-CM | POA: Diagnosis not present

## 2019-01-26 DIAGNOSIS — Z9862 Peripheral vascular angioplasty status: Secondary | ICD-10-CM

## 2019-01-26 DIAGNOSIS — Z95820 Peripheral vascular angioplasty status with implants and grafts: Secondary | ICD-10-CM | POA: Diagnosis not present

## 2019-01-26 DIAGNOSIS — R69 Illness, unspecified: Secondary | ICD-10-CM | POA: Diagnosis not present

## 2019-01-26 DIAGNOSIS — F17200 Nicotine dependence, unspecified, uncomplicated: Secondary | ICD-10-CM

## 2019-01-26 NOTE — Progress Notes (Signed)
SUBJECTIVE:  Patient ID: Lindsay [name], female    DOB: 1941/07/12, 78 y.o.   MRN: [id number] Chief Complaint  Patient presents with  . Follow-up    HPI  Lindsay [name] is a 78 y.o. female The patient returns to the office for followup and review of the noninvasive studies. There has been no significant deterioration in the lower extremity symptoms.  The patient does not note interval shortening of their claudication distance or development of mild rest pain symptoms. No new ulcers or wounds have occurred since the last visit.  There have been no significant changes to the patient's overall health care.  The patient denies amaurosis fugax or recent TIA symptoms. There are no recent neurological changes noted. The patient denies history of DVT, PE or superficial thrombophlebitis. The patient denies recent episodes of angina or shortness of breath.   ABI's Rt=0.64 and Lt= 0.82 (previous ABI's Rt=0.61 and Lt=1.01) The right lower extremity has monophasic tibial artery waveforms and the left lower extremity has triphasic tibial artery waveforms.  The bilateral toe waveforms are dampened.    Past Medical History:  Diagnosis Date  . Anemia   . Anxiety   . Arthritis    osteo, back  . Depression   . Diverticulosis   . GERD (gastroesophageal reflux disease)   . Hematochezia   . Hypertension    controlled on meds  . Hypothyroidism   . PAD (peripheral artery disease) ([facility])     Past Surgical History:  Procedure Laterality Date  . ABDOMINAL HYSTERECTOMY    . CARDIAC CATHETERIZATION  1995   normal, Dr Clayborn Bigness  . COLONOSCOPY  12/29/08  . COLONOSCOPY N/A 04/18/2015   Procedure: COLONOSCOPY;  Surgeon: Lucilla Lame, MD;  Location: [facility];  Service: Gastroenterology;  Laterality: N/A;  cecum time- 0957  . FOOT SURGERY Right   . KNEE ARTHROSCOPY Right 10/05/2015   Procedure: ARTHROSCOPY KNEE, partial lateral menisectomy;  Surgeon: Earnestine Leys, MD;  Location: [facility];   Service: Orthopedics;  Laterality: Right;  . LOWER EXTREMITY ANGIOGRAPHY Left 02/27/2018   Procedure: LOWER EXTREMITY ANGIOGRAPHY;  Surgeon: Algernon Huxley, MD;  Location: [facility];  Service: Cardiovascular;  Laterality: Left;  . LOWER EXTREMITY ANGIOGRAPHY Left 10/15/2018   Procedure: LOWER EXTREMITY ANGIOGRAPHY;  Surgeon: Algernon Huxley, MD;  Location: [facility];  Service: Cardiovascular;  Laterality: Left;  . LOWER EXTREMITY ANGIOGRAPHY Left 12/29/2018   Procedure: LOWER EXTREMITY ANGIOGRAPHY;  Surgeon: Algernon Huxley, MD;  Location: [facility];  Service: Cardiovascular;  Laterality: Left;  . POLYPECTOMY  04/18/2015   Procedure: POLYPECTOMY INTESTINAL;  Surgeon: Lucilla Lame, MD;  Location: [facility];  Service: Gastroenterology;;  . WRIST SURGERY Right     Social History   Socioeconomic History  . Marital status: Married    Spouse name: Not on file  . Number of children: Not on file  . Years of education: Not on file  . Highest education level: Not on file  Occupational History  . Not on file  Social Needs  . Financial resource strain: Not on file  . Food insecurity:    Worry: Not on file    Inability: Not on file  . Transportation needs:    Medical: Not on file    Non-medical: Not on file  Tobacco Use  . Smoking status: Former Smoker    Packs/day: 0.50    Years: 30.00    Pack years: 15.00    Types:  Cigarettes    Last attempt to quit: 10/14/2018    Years since quitting: 0.2  . Smokeless tobacco: Never Used  Substance and Sexual Activity  . Alcohol use: Yes    Alcohol/week: 0.0 standard drinks    Comment: occassionally/ whiskey on weekend; total of 4 drinks per week  . Drug use: No  . Sexual activity: Not Currently  Lifestyle  . Physical activity:    Days per week: Not on file    Minutes per session: Not on file  . Stress: Not on file  Relationships  . Social connections:    Talks on phone: Not on file    Gets together: Not on  file    Attends religious service: Not on file    Active member of club or organization: Not on file    Attends meetings of clubs or organizations: Not on file    Relationship status: Not on file  . Intimate partner violence:    Fear of current or ex partner: Not on file    Emotionally abused: Not on file    Physically abused: Not on file    Forced sexual activity: Not on file  Other Topics Concern  . Not on file  Social History Narrative   From [place].   [place]- 20 hours week.    Husband in nursing home in [place].    1 child; 3 step children   Lives by herself, independent          Family History  Problem Relation Age of Onset  . Diabetes Mother   . Hypertension Mother   . Diabetes Sister   . Hypertension Sister     Allergies  Allergen Reactions  . Aspirin Other (See Comments)    GI BLEED  . Nsaids Other (See Comments)    GI BLEED  . Penicillins Itching, Rash and Other (See Comments)    Has patient had a PCN reaction causing immediate rash, facial/tongue/throat swelling, SOB or lightheadedness with hypotension: Yes Has patient had a PCN reaction causing severe rash involving mucus membranes or skin necrosis: No Has patient had a PCN reaction that required hospitalization No Has patient had a PCN reaction occurring within the last 10 years: No If all of the above answers are "NO", then may proceed with Cephalosporin use.   . Pollen Extract Other (See Comments)    Congestion, Eye Irritation     Review of Systems   Review of Systems: Negative Unless Checked Constitutional: [] Weight loss  [] Fever  [] Chills Cardiac: [] Chest pain   []  Atrial Fibrillation  [] Palpitations   [] Shortness of breath when laying flat   [] Shortness of breath with exertion. [] Shortness of breath at rest Vascular:  [] Pain in legs with walking   [x] Pain in legs with standing [] Pain in legs when laying flat   [x] Claudication    [] Pain in feet when laying flat     [] History of DVT   [] Phlebitis   [] Swelling in legs   [] Varicose veins   [] Non-healing ulcers Pulmonary:   [] Uses home oxygen   [] Productive cough   [] Hemoptysis   [] Wheeze  [] COPD   [] Asthma Neurologic:  [] Dizziness   [] Seizures  [] Blackouts [] History of stroke   [] History of TIA  [] Aphasia   [] Temporary Blindness   [] Weakness or numbness in arm   [] Weakness or numbness in leg Musculoskeletal:   [] Joint swelling   [x] Joint pain   [] Low back pain  []  History of Knee Replacement [] Arthritis [] back Surgeries  []   Spinal Stenosis    Hematologic:  [] Easy bruising  [] Easy bleeding   [] Hypercoagulable state   [] Anemic Gastrointestinal:  [] Diarrhea   [] Vomiting  [] Gastroesophageal reflux/heartburn   [] Difficulty swallowing. [] Abdominal pain Genitourinary:  [] Chronic kidney disease   [] Difficult urination  [] Anuric   [] Blood in urine [] Frequent urination  [] Burning with urination   [] Hematuria Skin:  [] Rashes   [] Ulcers [] Wounds Psychological:  [] History of anxiety   []  History of major depression  []  Memory Difficulties      OBJECTIVE:   Physical Exam  BP (!) 164/80 (BP Location: Left Arm, Patient Position: Sitting, Cuff Size: Small)   Pulse 66   Resp 10   Ht 5\' 5"  (1.651 m)   Wt 163 lb (73.9 kg)   BMI 27.12 kg/m   Gen: WD/WN, NAD Head: [place]/AT, No temporalis wasting.  Ear/Nose/Throat: Hearing grossly intact, nares w/o erythema or drainage Eyes: PER, EOMI, sclera nonicteric.  Neck: Supple, no masses.  No JVD.  Pulmonary:  Good air movement, no use of accessory muscles.  Cardiac: RRR Vascular:  Vessel Right Left  Radial Palpable Palpable  Dorsalis Pedis Not Palpable trace Palpable  Posterior Tibial Not Palpable Not Palpable   Gastrointestinal: soft, non-distended. No guarding/no peritoneal signs.  Musculoskeletal: M/S 5/5 throughout.  No deformity or atrophy.  Neurologic: Pain and light touch intact in extremities.  Symmetrical.  Speech is fluent. Motor exam as listed above. Psychiatric:  Judgment intact, Mood & affect appropriate for pt's clinical situation. Dermatologic: No Venous rashes. No Ulcers Noted.  No changes consistent with cellulitis. Lymph : No Cervical lymphadenopathy, no lichenification or skin changes of chronic lymphedema.       ASSESSMENT AND PLAN:  1. Hyperlipidemia, unspecified hyperlipidemia type Continue statin as ordered and reviewed, no changes at this time   2. Tobacco use disorder Smoking cessation was discussed, 3-10 minutes spent on this topic specifically   3. PAD (peripheral artery disease) ([facility])  Recommend:  The patient has evidence of atherosclerosis of the lower extremities with claudication.  The patient does not voice lifestyle limiting changes at this point in time.  Noninvasive studies do not suggest clinically significant change.  No invasive studies, angiography or surgery at this time The patient should continue walking and begin a more formal exercise program.  The patient should continue antiplatelet therapy and aggressive treatment of the lipid abnormalities  No changes in the patient's medications at this time  The patient should continue wearing graduated compression socks 10-15 mmHg strength to control the mild edema.   Patient will follow up in 3 months.   - VAS [place] ABI WITH/WO TBI; Future   Current Outpatient Medications on File Prior to Visit  Medication Sig Dispense Refill  . acetaminophen (TYLENOL) 500 MG tablet Take 1,000 mg by mouth daily.    Marland Kitchen amLODipine (NORVASC) 5 MG tablet Take 10 mg by mouth daily.     Marland Kitchen apixaban (ELIQUIS) 5 MG TABS tablet Take 1 tablet (5 mg total) by mouth 2 (two) times daily. 180 tablet 3  . atorvastatin (LIPITOR) 10 MG tablet Take 1 tablet (10 mg total) by mouth daily. 90 tablet 3  . bisoprolol-hydrochlorothiazide (ZIAC) 10-6.25 MG tablet TAKE 1 TABLET BY MOUTH DAILY (Patient taking differently: Take 1 tablet by mouth daily. ) 30 tablet 0  . levothyroxine (SYNTHROID, LEVOTHROID) 50  MCG tablet TAKE ONE (1) TABLET BY MOUTH EVERY DAY (Patient taking differently: Take 50 mcg by mouth daily. ) 90 tablet 0  . losartan (COZAAR) 100 MG  tablet TAKE ONE (1) TABLET BY MOUTH EVERY DAY (Patient taking differently: Take 100 mg by mouth daily. ) 90 tablet 3  . Multiple Vitamins-Minerals (MULTI COMPLETE PO) Take 1 tablet by mouth daily.     . pantoprazole (PROTONIX) 40 MG tablet TAKE ONE (1) TABLET EACH DAY (Patient taking differently: Take 40 mg by mouth daily. ) 90 tablet 3  . DiphenhydrAMINE HCl (ALKA-SELTZER PLUS ALLERGY PO) Take 1 tablet by mouth daily as needed (allergies).     No current facility-administered medications on file prior to visit.     There are no Patient Instructions on file for this visit. No follow-ups on file.   Kris Hartmann, NP  This note was completed with Sales executive.  Any errors are purely unintentional.

## 2019-01-30 DIAGNOSIS — Z1231 Encounter for screening mammogram for malignant neoplasm of breast: Secondary | ICD-10-CM | POA: Diagnosis not present

## 2019-02-02 ENCOUNTER — Encounter (INDEPENDENT_AMBULATORY_CARE_PROVIDER_SITE_OTHER): Payer: Self-pay

## 2019-02-02 ENCOUNTER — Other Ambulatory Visit (INDEPENDENT_AMBULATORY_CARE_PROVIDER_SITE_OTHER): Payer: Self-pay | Admitting: Nurse Practitioner

## 2019-02-02 DIAGNOSIS — M79605 Pain in left leg: Secondary | ICD-10-CM

## 2019-02-02 DIAGNOSIS — Z95820 Peripheral vascular angioplasty status with implants and grafts: Secondary | ICD-10-CM

## 2019-02-03 ENCOUNTER — Other Ambulatory Visit: Payer: Self-pay

## 2019-02-03 ENCOUNTER — Encounter (INDEPENDENT_AMBULATORY_CARE_PROVIDER_SITE_OTHER): Payer: Self-pay | Admitting: Vascular Surgery

## 2019-02-03 ENCOUNTER — Ambulatory Visit (INDEPENDENT_AMBULATORY_CARE_PROVIDER_SITE_OTHER): Payer: Medicare HMO | Admitting: Vascular Surgery

## 2019-02-03 ENCOUNTER — Ambulatory Visit (INDEPENDENT_AMBULATORY_CARE_PROVIDER_SITE_OTHER): Payer: Medicare HMO

## 2019-02-03 VITALS — BP 181/96 | HR 69 | Resp 12 | Ht 65.0 in | Wt 165.0 lb

## 2019-02-03 DIAGNOSIS — I10 Essential (primary) hypertension: Secondary | ICD-10-CM

## 2019-02-03 DIAGNOSIS — E785 Hyperlipidemia, unspecified: Secondary | ICD-10-CM

## 2019-02-03 DIAGNOSIS — Z95820 Peripheral vascular angioplasty status with implants and grafts: Secondary | ICD-10-CM

## 2019-02-03 DIAGNOSIS — Z79899 Other long term (current) drug therapy: Secondary | ICD-10-CM

## 2019-02-03 DIAGNOSIS — M79605 Pain in left leg: Secondary | ICD-10-CM

## 2019-02-03 DIAGNOSIS — Z87891 Personal history of nicotine dependence: Secondary | ICD-10-CM | POA: Diagnosis not present

## 2019-02-03 DIAGNOSIS — I70222 Atherosclerosis of native arteries of extremities with rest pain, left leg: Secondary | ICD-10-CM

## 2019-02-03 NOTE — Assessment & Plan Note (Signed)
lipid control important in reducing the progression of atherosclerotic disease. Continue statin therapy  

## 2019-02-03 NOTE — Assessment & Plan Note (Signed)
The patient has stable reduction in her right ABI of 0.5 without significant symptoms and a normal left ABI of 1.0.  She has some mild swelling but does not elevate her legs or wear compression stockings.  At this point, the pain is unlikely to be related to arterial insufficiency.  I will make a 70-month follow-up visit with the patient but will be happy to see her back at any point in the meantime if problems develop in the interim.  I have recommended she continue her current medical regimen.

## 2019-02-03 NOTE — Progress Notes (Signed)
MRN : [id number]  Lindsay [name] is a 78 y.o. (12-05-1940) female who presents with chief complaint of  Chief Complaint  Patient presents with  . Follow-up  .  History of Present Illness: Patient returns today in follow up of her PAD.  She still complains of some muscular pain in the left leg.  No new ulceration or infection.  She does have some swelling in the left ankle that remains noticeable.  Her ABIs today are 0.5 on the right and 1.0 on the left with no recurrent stenosis in the left lower extremity by duplex.  She denies any rest pain or claudication symptoms in that right leg.  Current Outpatient Medications  Medication Sig Dispense Refill  . acetaminophen (TYLENOL) 500 MG tablet Take 1,000 mg by mouth daily.    Marland Kitchen amLODipine (NORVASC) 5 MG tablet Take 10 mg by mouth daily.     Marland Kitchen apixaban (ELIQUIS) 5 MG TABS tablet Take 1 tablet (5 mg total) by mouth 2 (two) times daily. 180 tablet 3  . atorvastatin (LIPITOR) 10 MG tablet Take 1 tablet (10 mg total) by mouth daily. 90 tablet 3  . bisoprolol-hydrochlorothiazide (ZIAC) 10-6.25 MG tablet TAKE 1 TABLET BY MOUTH DAILY (Patient taking differently: Take 1 tablet by mouth daily. ) 30 tablet 0  . DiphenhydrAMINE HCl (ALKA-SELTZER PLUS ALLERGY PO) Take 1 tablet by mouth daily as needed (allergies).    Marland Kitchen levothyroxine (SYNTHROID, LEVOTHROID) 50 MCG tablet TAKE ONE (1) TABLET BY MOUTH EVERY DAY (Patient taking differently: Take 50 mcg by mouth daily. ) 90 tablet 0  . losartan (COZAAR) 100 MG tablet TAKE ONE (1) TABLET BY MOUTH EVERY DAY (Patient taking differently: Take 100 mg by mouth daily. ) 90 tablet 3  . Multiple Vitamins-Minerals (MULTI COMPLETE PO) Take 1 tablet by mouth daily.     . pantoprazole (PROTONIX) 40 MG tablet TAKE ONE (1) TABLET EACH DAY (Patient taking differently: Take 40 mg by mouth daily. ) 90 tablet 3   No current facility-administered medications for this visit.     Past Medical History:  Diagnosis Date  . Anemia    . Anxiety   . Arthritis    osteo, back  . Depression   . Diverticulosis   . GERD (gastroesophageal reflux disease)   . Hematochezia   . Hypertension    controlled on meds  . Hypothyroidism   . PAD (peripheral artery disease) ([place])     Past Surgical History:  Procedure Laterality Date  . ABDOMINAL HYSTERECTOMY    . CARDIAC CATHETERIZATION  1995   normal, Dr Clayborn Bigness  . COLONOSCOPY  12/29/08  . COLONOSCOPY N/A 04/18/2015   Procedure: COLONOSCOPY;  Surgeon: Lucilla Lame, MD;  Location: [facility];  Service: Gastroenterology;  Laterality: N/A;  cecum time- 0957  . FOOT SURGERY Right   . KNEE ARTHROSCOPY Right 10/05/2015   Procedure: ARTHROSCOPY KNEE, partial lateral menisectomy;  Surgeon: Earnestine Leys, MD;  Location: [facility];  Service: Orthopedics;  Laterality: Right;  . LOWER EXTREMITY ANGIOGRAPHY Left 02/27/2018   Procedure: LOWER EXTREMITY ANGIOGRAPHY;  Surgeon: Algernon Huxley, MD;  Location: [facility];  Service: Cardiovascular;  Laterality: Left;  . LOWER EXTREMITY ANGIOGRAPHY Left 10/15/2018   Procedure: LOWER EXTREMITY ANGIOGRAPHY;  Surgeon: Algernon Huxley, MD;  Location: [facility];  Service: Cardiovascular;  Laterality: Left;  . LOWER EXTREMITY ANGIOGRAPHY Left 12/29/2018   Procedure: LOWER EXTREMITY ANGIOGRAPHY;  Surgeon: Algernon Huxley, MD;  Location: [facility];  Service: Cardiovascular;  Laterality: Left;  . POLYPECTOMY  04/18/2015   Procedure: POLYPECTOMY INTESTINAL;  Surgeon: Lucilla Lame, MD;  Location: [facility];  Service: Gastroenterology;;  . WRIST SURGERY Right     Social History Social History   Tobacco Use  . Smoking status: Former Smoker    Packs/day: 0.50    Years: 30.00    Pack years: 15.00    Types: Cigarettes    Last attempt to quit: 10/14/2018    Years since quitting: 0.3  . Smokeless tobacco: Never Used  Substance Use Topics  . Alcohol use: Yes    Alcohol/week: 0.0 standard drinks    Comment:  occassionally/ whiskey on weekend; total of 4 drinks per week  . Drug use: No     Family History Family History  Problem Relation Age of Onset  . Diabetes Mother   . Hypertension Mother   . Diabetes Sister   . Hypertension Sister     Allergies  Allergen Reactions  . Aspirin Other (See Comments)    GI BLEED  . Nsaids Other (See Comments)    GI BLEED  . Penicillins Itching, Rash and Other (See Comments)    Has patient had a PCN reaction causing immediate rash, facial/tongue/throat swelling, SOB or lightheadedness with hypotension: Yes Has patient had a PCN reaction causing severe rash involving mucus membranes or skin necrosis: No Has patient had a PCN reaction that required hospitalization No Has patient had a PCN reaction occurring within the last 10 years: No If all of the above answers are "NO", then may proceed with Cephalosporin use.   . Pollen Extract Other (See Comments)    Congestion, Eye Irritation    REVIEW OF SYSTEMS (Negative unless checked)  Constitutional: [] ?Weight loss  [] ?Fever  [] ?Chills Cardiac: [] ?Chest pain   [] ?Chest pressure   [] ?Palpitations   [] ?Shortness of breath when laying flat   [] ?Shortness of breath at rest   [] ?Shortness of breath with exertion. Vascular:  [x] ?Pain in legs with walking   [] ?Pain in legs at rest   [] ?Pain in legs when laying flat   [] ?Claudication   [x] ?Pain in feet when walking  [x] ?Pain in feet at rest  [] ?Pain in feet when laying flat   [] ?History of DVT   [] ?Phlebitis   [] ?Swelling in legs   [] ?Varicose veins   [] ?Non-healing ulcers Pulmonary:   [] ?Uses home oxygen   [] ?Productive cough   [] ?Hemoptysis   [] ?Wheeze  [] ?COPD   [] ?Asthma Neurologic:  [] ?Dizziness  [] ?Blackouts   [] ?Seizures   [] ?History of stroke   [] ?History of TIA  [] ?Aphasia   [] ?Temporary blindness   [] ?Dysphagia   [] ?Weakness or numbness in arms   [] ?Weakness or numbness in legs Musculoskeletal:  [x] ?Arthritis   [] ?Joint swelling   [] ?Joint pain   [] ?Low  back pain Hematologic:  [] ?Easy bruising  [] ?Easy bleeding   [] ?Hypercoagulable state   [x] ?Anemic  [] ?Hepatitis Gastrointestinal:  [] ?Blood in stool   [] ?Vomiting blood  [x] ?Gastroesophageal reflux/heartburn   [] ?Abdominal pain Genitourinary:  [] ?Chronic kidney disease   [] ?Difficult urination  [] ?Frequent urination  [] ?Burning with urination   [] ?Hematuria Skin:  [] ?Rashes   [] ?Ulcers   [] ?Wounds Psychological:  [] ?History of anxiety   [] ? History of major depression.  Physical Examination  BP (!) 181/96 (BP Location: Left Arm, Patient Position: Sitting, Cuff Size: Small)   Pulse 69   Resp 12   Ht 5\' 5"  (1.651 m)   Wt 165 lb (74.8 kg)   BMI 27.46 kg/m  Gen:  WD/WN, NAD Head: [place]/AT, No temporalis wasting. Ear/Nose/Throat: Hearing grossly intact, nares w/o erythema or drainage Eyes: Conjunctiva clear. Sclera non-icteric Neck: Supple.  Trachea midline Pulmonary:  Good air movement, no use of accessory muscles.  Cardiac: RRR, no JVD Vascular:  Vessel Right Left  Radial Palpable Palpable                          PT  1+ palpable  1+ palpable  DP  1+ palpable  2+ palpable   Gastrointestinal: soft, non-tender/non-distended. No guarding/reflex.  Musculoskeletal: M/S 5/5 throughout.  No deformity or atrophy.  Mild left lower extremity edema. Neurologic: Sensation grossly intact in extremities.  Symmetrical.  Speech is fluent.  Psychiatric: Judgment intact, Mood & affect appropriate for pt's clinical situation. Dermatologic: No rashes or ulcers noted.  No cellulitis or open wounds.       Labs Recent Results (from the past 2160 hour(s))  BUN     Status: None   Collection Time: 12/26/18 10:11 AM  Result Value Ref Range   BUN 15 8 - 23 mg/dL    Comment: Performed at [facility], [address] [place], [place] [address]  Creatinine, serum     Status: None   Collection Time: 12/26/18 10:11 AM  Result Value Ref Range   Creatinine, Ser 0.65 0.44 - 1.00 mg/dL    GFR calc non Af Amer >60 >60 mL/min   GFR calc Af Amer >60 >60 mL/min    Comment: Performed at [facility], [address] [place], [place] [address]    Radiology Burnard Bunting With/wo Tbi  Result Date: 01/27/2019 LOWER EXTREMITY DOPPLER STUDY Indications: Peripheral artery disease, and Peripheral Vascular Disease              Recent sudden ischemic issues in left lower extremity, 10/15/2018              revascularization left with PTA and SFA to pop covered stent. High Risk Factors: Hypertension.  Vascular Interventions: On 02/27/2018 PTA of the left ATA angioplasty/balloon.                         PTA left SFA & proximal popliteal artery with                         angioplasty/balloon. Stent placment to the left SFA *                         proximal popliteal artery. Thrombolytic therapy of the                         left tiobperoneal trunk and poximal posterior tibial                         artery. Mechanical thrombectomy left tibioperoneal trunk                         and pox posterior tibial artery. PTA left tibioperoneal                         trunk and pox posterior tibial artery  angioplasty/balloon.                         12/29/2018 PTA of thr right CIA. Comparison Study: 12/24/2018 Performing Technologist: Charlane Ferretti RT (R)(VS)  Examination Guidelines: A complete evaluation includes at minimum, Doppler waveform signals and systolic blood pressure reading at the level of bilateral brachial, anterior tibial, and posterior tibial arteries, when vessel segments are accessible. Bilateral testing is considered an integral part of a complete examination. Photoelectric Plethysmograph (PPG) waveforms and toe systolic pressure readings are included as required and additional duplex testing as needed. Limited examinations for reoccurring indications may be performed as noted.  ABI Findings: +---------+------------------+-----+----------+--------+ Right    Rt Pressure  (mmHg)IndexWaveform  Comment  +---------+------------------+-----+----------+--------+ Brachial 173                                       +---------+------------------+-----+----------+--------+ ATA      107               0.62 monophasic         +---------+------------------+-----+----------+--------+ PTA      110               0.64 monophasic         +---------+------------------+-----+----------+--------+ Great Toe43                0.25                    +---------+------------------+-----+----------+--------+ +---------+------------------+-----+---------+-------+ Left     Lt Pressure (mmHg)IndexWaveform Comment +---------+------------------+-----+---------+-------+ Brachial 163                                     +---------+------------------+-----+---------+-------+ ATA      115               0.66                  +---------+------------------+-----+---------+-------+ PTA      141               0.82 triphasic        +---------+------------------+-----+---------+-------+ Great Toe53                0.31                  +---------+------------------+-----+---------+-------+ +-------+-----------+-----------+------------+------------+ ABI/TBIToday's ABIToday's TBIPrevious ABIPrevious TBI +-------+-----------+-----------+------------+------------+ Right  .64        .25        .61         .51          +-------+-----------+-----------+------------+------------+ Left   .82        .31        1.01        .91          +-------+-----------+-----------+------------+------------+ Right ABIs appear essentially unchanged compared to prior study on 12/24/2018. Left ABIs appear decreased compared to prior study on 12/24/2018. Bilateral TBI's have decreased since previous exam on 12/24/2018. Summary: Right: Resting right ankle-brachial index indicates moderate right lower extremity arterial disease. The right toe-brachial index is abnormal. Left: Resting  left ankle-brachial index indicates mild left lower extremity arterial disease. The left toe-brachial index is abnormal.  *See table(s) above for measurements and observations.  Electronically signed by Leotis Pain MD on 01/27/2019 at 2:43:21  PM.    Final     Assessment/Plan  Hypertension blood pressure control important in reducing the progression of atherosclerotic disease. On appropriate oral medications.   Hyperlipidemia lipid control important in reducing the progression of atherosclerotic disease. Continue statin therapy   Atherosclerosis of native arteries of extremity with rest pain ([place]) The patient has stable reduction in her right ABI of 0.5 without significant symptoms and a normal left ABI of 1.0.  She has some mild swelling but does not elevate her legs or wear compression stockings.  At this point, the pain is unlikely to be related to arterial insufficiency.  I will make a 44-month follow-up visit with the patient but will be happy to see her back at any point in the meantime if problems develop in the interim.  I have recommended she continue her current medical regimen.    Leotis Pain, MD  02/03/2019 10:14 AM    This note was created with Dragon medical transcription system.  Any errors from dictation are purely unintentional

## 2019-02-03 NOTE — Assessment & Plan Note (Signed)
blood pressure control important in reducing the progression of atherosclerotic disease. On appropriate oral medications.  

## 2019-02-03 NOTE — Patient Instructions (Signed)
Peripheral Vascular Disease  Peripheral vascular disease (PVD) is a disease of the blood vessels that are not part of your heart and brain. A simple term for PVD is poor circulation. In most cases, PVD narrows the blood vessels that carry blood from your heart to the rest of your body. This can reduce the supply of blood to your arms, legs, and internal organs, like your stomach or kidneys. However, PVD most often affects a person's lower legs and feet. Without treatment, PVD tends to get worse. PVD can also lead to acute ischemic limb. This is when an arm or leg suddenly cannot get enough blood. This is a medical emergency. Follow these instructions at home: Lifestyle  Do not use any products that contain nicotine or tobacco, such as cigarettes and e-cigarettes. If you need help quitting, ask your doctor.  Lose weight if you are overweight. Or, stay at a healthy weight as told by your doctor.  Eat a diet that is low in fat and cholesterol. If you need help, ask your doctor.  Exercise regularly. Ask your doctor for activities that are right for you. General instructions  Take over-the-counter and prescription medicines only as told by your doctor.  Take good care of your feet: ? Wear comfortable shoes that fit well. ? Check your feet often for any cuts or sores.  Keep all follow-up visits as told by your doctor This is important. Contact a doctor if:  You have cramps in your legs when you walk.  You have leg pain when you are at rest.  You have coldness in a leg or foot.  Your skin changes.  You are unable to get or have an erection (erectile dysfunction).  You have cuts or sores on your feet that do not heal. Get help right away if:  Your arm or leg turns cold, numb, and blue.  Your arms or legs become red, warm, swollen, painful, or numb.  You have chest pain.  You have trouble breathing.  You suddenly have weakness in your face, arm, or leg.  You become very  confused or you cannot speak.  You suddenly have a very bad headache.  You suddenly cannot see. Summary  Peripheral vascular disease (PVD) is a disease of the blood vessels.  A simple term for PVD is poor circulation. Without treatment, PVD tends to get worse.  Treatment may include exercise, low fat and low cholesterol diet, and quitting smoking. This information is not intended to replace advice given to you by your health care provider. Make sure you discuss any questions you have with your health care provider. Document Released: 02/13/2010 Document Revised: 12/27/2016 Document Reviewed: 12/27/2016 Elsevier Interactive Patient Education  2019 [employer]  

## 2019-04-14 DIAGNOSIS — E039 Hypothyroidism, unspecified: Secondary | ICD-10-CM | POA: Diagnosis not present

## 2019-04-21 DIAGNOSIS — I739 Peripheral vascular disease, unspecified: Secondary | ICD-10-CM | POA: Diagnosis not present

## 2019-04-21 DIAGNOSIS — E039 Hypothyroidism, unspecified: Secondary | ICD-10-CM | POA: Diagnosis not present

## 2019-04-21 DIAGNOSIS — I10 Essential (primary) hypertension: Secondary | ICD-10-CM | POA: Diagnosis not present

## 2019-04-21 DIAGNOSIS — E7800 Pure hypercholesterolemia, unspecified: Secondary | ICD-10-CM | POA: Diagnosis not present

## 2019-04-21 DIAGNOSIS — K219 Gastro-esophageal reflux disease without esophagitis: Secondary | ICD-10-CM | POA: Diagnosis not present

## 2019-04-28 ENCOUNTER — Ambulatory Visit (INDEPENDENT_AMBULATORY_CARE_PROVIDER_SITE_OTHER): Payer: Medicare HMO | Admitting: Vascular Surgery

## 2019-04-28 ENCOUNTER — Encounter (INDEPENDENT_AMBULATORY_CARE_PROVIDER_SITE_OTHER): Payer: Medicare HMO

## 2019-04-28 ENCOUNTER — Encounter (INDEPENDENT_AMBULATORY_CARE_PROVIDER_SITE_OTHER): Payer: Self-pay

## 2019-05-01 ENCOUNTER — Encounter (INDEPENDENT_AMBULATORY_CARE_PROVIDER_SITE_OTHER): Payer: Self-pay

## 2019-07-01 ENCOUNTER — Encounter (INDEPENDENT_AMBULATORY_CARE_PROVIDER_SITE_OTHER): Payer: Self-pay

## 2019-07-01 NOTE — Telephone Encounter (Signed)
Please call her and set up an appointment with ABIs and an art duplex for whichever foot feels numb..me or dew

## 2019-07-06 ENCOUNTER — Other Ambulatory Visit (INDEPENDENT_AMBULATORY_CARE_PROVIDER_SITE_OTHER): Payer: Self-pay | Admitting: Nurse Practitioner

## 2019-07-06 DIAGNOSIS — R200 Anesthesia of skin: Secondary | ICD-10-CM

## 2019-07-06 DIAGNOSIS — Z95820 Peripheral vascular angioplasty status with implants and grafts: Secondary | ICD-10-CM

## 2019-07-08 ENCOUNTER — Encounter (INDEPENDENT_AMBULATORY_CARE_PROVIDER_SITE_OTHER): Payer: Medicare HMO

## 2019-07-08 ENCOUNTER — Encounter (INDEPENDENT_AMBULATORY_CARE_PROVIDER_SITE_OTHER): Payer: Self-pay

## 2019-07-08 ENCOUNTER — Ambulatory Visit (INDEPENDENT_AMBULATORY_CARE_PROVIDER_SITE_OTHER): Payer: Medicare HMO | Admitting: Nurse Practitioner

## 2019-07-15 ENCOUNTER — Ambulatory Visit (INDEPENDENT_AMBULATORY_CARE_PROVIDER_SITE_OTHER): Payer: Medicare HMO

## 2019-07-15 ENCOUNTER — Ambulatory Visit (INDEPENDENT_AMBULATORY_CARE_PROVIDER_SITE_OTHER): Payer: Medicare HMO | Admitting: Nurse Practitioner

## 2019-07-15 ENCOUNTER — Encounter (INDEPENDENT_AMBULATORY_CARE_PROVIDER_SITE_OTHER): Payer: Self-pay | Admitting: Nurse Practitioner

## 2019-07-15 ENCOUNTER — Other Ambulatory Visit: Payer: Self-pay

## 2019-07-15 VITALS — BP 159/78 | HR 66 | Resp 14 | Wt 163.0 lb

## 2019-07-15 DIAGNOSIS — I70222 Atherosclerosis of native arteries of extremities with rest pain, left leg: Secondary | ICD-10-CM | POA: Diagnosis not present

## 2019-07-15 DIAGNOSIS — Z95820 Peripheral vascular angioplasty status with implants and grafts: Secondary | ICD-10-CM

## 2019-07-15 DIAGNOSIS — R200 Anesthesia of skin: Secondary | ICD-10-CM

## 2019-07-15 DIAGNOSIS — I10 Essential (primary) hypertension: Secondary | ICD-10-CM

## 2019-07-15 DIAGNOSIS — E785 Hyperlipidemia, unspecified: Secondary | ICD-10-CM

## 2019-07-15 DIAGNOSIS — M50321 Other cervical disc degeneration at C4-C5 level: Secondary | ICD-10-CM | POA: Insufficient documentation

## 2019-07-15 NOTE — Progress Notes (Signed)
SUBJECTIVE:  Patient ID: Lindsay [name], female    DOB: Nov 25, 1941, 78 y.o.   MRN: [id number] Chief Complaint  Patient presents with  . Follow-up    HPI  Lindsay [name] is a 78 y.o. female that complains of pain, in her toes but primarily the ball of the foot.  This is in her left lower extremity, where she has had ischemic events before.  She has been taking her eliquis as prescribed.  She denies any fever, chills, nausea or vomiting.  She denies worsening of claudication, discoloration of toes or rest pain.   Non invasive studies show an ABI 0.72 on the right and 1.19 on the left.  The right lower extremity has monophasic waveforms within the tibial arteries with biphasic waveforms of the left tibial arteries.  There are dampened toe waveforms bilaterally  Past Medical History:  Diagnosis Date  . Anemia   . Anxiety   . Arthritis    osteo, back  . Depression   . Diverticulosis   . GERD (gastroesophageal reflux disease)   . Hematochezia   . Hypertension    controlled on meds  . Hypothyroidism   . PAD (peripheral artery disease) ([place])     Past Surgical History:  Procedure Laterality Date  . ABDOMINAL HYSTERECTOMY    . CARDIAC CATHETERIZATION  1995   normal, Dr Clayborn Bigness  . COLONOSCOPY  12/29/08  . COLONOSCOPY N/A 04/18/2015   Procedure: COLONOSCOPY;  Surgeon: Lucilla Lame, MD;  Location: [facility];  Service: Gastroenterology;  Laterality: N/A;  cecum time- 0957  . FOOT SURGERY Right   . KNEE ARTHROSCOPY Right 10/05/2015   Procedure: ARTHROSCOPY KNEE, partial lateral menisectomy;  Surgeon: Earnestine Leys, MD;  Location: [facility];  Service: Orthopedics;  Laterality: Right;  . LOWER EXTREMITY ANGIOGRAPHY Left 02/27/2018   Procedure: LOWER EXTREMITY ANGIOGRAPHY;  Surgeon: Algernon Huxley, MD;  Location: [facility];  Service: Cardiovascular;  Laterality: Left;  . LOWER EXTREMITY ANGIOGRAPHY Left 10/15/2018   Procedure: LOWER EXTREMITY ANGIOGRAPHY;  Surgeon: Algernon Huxley, MD;  Location: [facility];  Service: Cardiovascular;  Laterality: Left;  . LOWER EXTREMITY ANGIOGRAPHY Left 12/29/2018   Procedure: LOWER EXTREMITY ANGIOGRAPHY;  Surgeon: Algernon Huxley, MD;  Location: [facility];  Service: Cardiovascular;  Laterality: Left;  . POLYPECTOMY  04/18/2015   Procedure: POLYPECTOMY INTESTINAL;  Surgeon: Lucilla Lame, MD;  Location: [facility];  Service: Gastroenterology;;  . WRIST SURGERY Right     Social History   Socioeconomic History  . Marital status: Married    Spouse name: Not on file  . Number of children: Not on file  . Years of education: Not on file  . Highest education level: Not on file  Occupational History  . Not on file  Social Needs  . Financial resource strain: Not on file  . Food insecurity    Worry: Not on file    Inability: Not on file  . Transportation needs    Medical: Not on file    Non-medical: Not on file  Tobacco Use  . Smoking status: Former Smoker    Packs/day: 0.50    Years: 30.00    Pack years: 15.00    Types: Cigarettes    Quit date: 10/14/2018    Years since quitting: 0.7  . Smokeless tobacco: Never Used  Substance and Sexual Activity  . Alcohol use: Yes    Alcohol/week: 0.0 standard drinks    Comment: occassionally/ whiskey  on weekend; total of 4 drinks per week  . Drug use: No  . Sexual activity: Not Currently  Lifestyle  . Physical activity    Days per week: Not on file    Minutes per session: Not on file  . Stress: Not on file  Relationships  . Social Herbalist on phone: Not on file    Gets together: Not on file    Attends religious service: Not on file    Active member of club or organization: Not on file    Attends meetings of clubs or organizations: Not on file    Relationship status: Not on file  . Intimate partner violence    Fear of current or ex partner: Not on file    Emotionally abused: Not on file    Physically abused: Not on file    Forced  sexual activity: Not on file  Other Topics Concern  . Not on file  Social History Narrative   From [place].   [place]- 20 hours week.    Husband in nursing home in [place].    1 child; 3 step children   Lives by herself, independent          Family History  Problem Relation Age of Onset  . Diabetes Mother   . Hypertension Mother   . Diabetes Sister   . Hypertension Sister     Allergies  Allergen Reactions  . Aspirin Other (See Comments)    GI BLEED  . Nsaids Other (See Comments)    GI BLEED  . Penicillins Itching, Rash and Other (See Comments)    Has patient had a PCN reaction causing immediate rash, facial/tongue/throat swelling, SOB or lightheadedness with hypotension: Yes Has patient had a PCN reaction causing severe rash involving mucus membranes or skin necrosis: No Has patient had a PCN reaction that required hospitalization No Has patient had a PCN reaction occurring within the last 10 years: No If all of the above answers are "NO", then may proceed with Cephalosporin use.   . Pollen Extract Other (See Comments)    Congestion, Eye Irritation     Review of Systems   Review of Systems: Negative Unless Checked Constitutional: [] Weight loss  [] Fever  [] Chills Cardiac: [] Chest pain   []  Atrial Fibrillation  [] Palpitations   [] Shortness of breath when laying flat   [] Shortness of breath with exertion. [] Shortness of breath at rest Vascular:  [x] Pain in legs with walking   [] Pain in legs with standing [] Pain in legs when laying flat   [x] Claudication    [] Pain in feet when laying flat    [] History of DVT   [] Phlebitis   [] Swelling in legs   [] Varicose veins   [] Non-healing ulcers Pulmonary:   [] Uses home oxygen   [] Productive cough   [] Hemoptysis   [] Wheeze  [] COPD   [] Asthma Neurologic:  [] Dizziness   [] Seizures  [] Blackouts [] History of stroke   [] History of TIA  [] Aphasia   [] Temporary Blindness   [] Weakness or numbness in arm    [x] Weakness or numbness in leg Musculoskeletal:   [] Joint swelling   [] Joint pain   [] Low back pain  []  History of Knee Replacement [x] Arthritis [] back Surgeries  []  Spinal Stenosis    Hematologic:  [] Easy bruising  [] Easy bleeding   [] Hypercoagulable state   [] Anemic Gastrointestinal:  [] Diarrhea   [] Vomiting  [x] Gastroesophageal reflux/heartburn   [] Difficulty swallowing. [] Abdominal pain Genitourinary:  [] Chronic kidney disease   [] Difficult urination  []   Anuric   [] Blood in urine [] Frequent urination  [] Burning with urination   [] Hematuria Skin:  [] Rashes   [] Ulcers [] Wounds Psychological:  [] History of anxiety   []  History of major depression  []  Memory Difficulties      OBJECTIVE:   Physical Exam  BP (!) 159/78 (BP Location: Right Arm)   Pulse 66   Resp 14   Wt 163 lb (73.9 kg)   BMI 27.12 kg/m   Gen: WD/WN, NAD Head: [place]/AT, No temporalis wasting.  Ear/Nose/Throat: Hearing grossly intact, nares w/o erythema or drainage Eyes: PER, EOMI, sclera nonicteric.  Neck: Supple, no masses.  No JVD.  Pulmonary:  Good air movement, no use of accessory muscles.  Cardiac: RRR Vascular:  Vessel Right Left  Radial Palpable Palpable  Dorsalis Pedis Palpable Palpable  Posterior Tibial Palpable Palpable   Gastrointestinal: soft, non-distended. No guarding/no peritoneal signs.  Musculoskeletal: M/S 5/5 throughout.  No deformity or atrophy.  Neurologic: Pain and light touch intact in extremities.  Symmetrical.  Speech is fluent. Motor exam as listed above. Psychiatric: Judgment intact, Mood & affect appropriate for pt's clinical situation. Dermatologic: No Venous rashes. No Ulcers Noted.  No changes consistent with cellulitis. Lymph : No Cervical lymphadenopathy, no lichenification or skin changes of chronic lymphedema.       ASSESSMENT AND PLAN:  1. Atherosclerosis of native artery of left lower extremity with rest pain [facility]) The patient has some microvascular disease however her toe  waveforms are unchanged from the last several ABIs, before and after her ischemic event.  We discussed using cilastazol to see if this could improve her numbness but she declined.  I also suggested changing footwear as her numbness is primarily on the ball of her feet.  - VAS [place] ABI WITH/WO TBI; Future - VAS [place] LOWER EXTREMITY ARTERIAL DUPLEX; Future  2. Hyperlipidemia, unspecified hyperlipidemia type Continue statin as ordered and reviewed, no changes at this time   3. Essential hypertension Continue antihypertensive medications as already ordered, these medications have been reviewed and there are no changes at this time.       Current Outpatient Medications on File Prior to Visit  Medication Sig Dispense Refill  . acetaminophen (TYLENOL) 500 MG tablet Take 1,000 mg by mouth daily.    Marland Kitchen amLODipine (NORVASC) 5 MG tablet Take 10 mg by mouth daily.     Marland Kitchen apixaban (ELIQUIS) 5 MG TABS tablet Take 1 tablet (5 mg total) by mouth 2 (two) times daily. 180 tablet 3  . atorvastatin (LIPITOR) 10 MG tablet Take 1 tablet (10 mg total) by mouth daily. 90 tablet 3  . bisoprolol-hydrochlorothiazide (ZIAC) 10-6.25 MG tablet TAKE 1 TABLET BY MOUTH DAILY (Patient taking differently: Take 1 tablet by mouth daily. ) 30 tablet 0  . DiphenhydrAMINE HCl (ALKA-SELTZER PLUS ALLERGY PO) Take 1 tablet by mouth daily as needed (allergies).    Marland Kitchen levothyroxine (SYNTHROID, LEVOTHROID) 50 MCG tablet TAKE ONE (1) TABLET BY MOUTH EVERY DAY (Patient taking differently: Take 50 mcg by mouth daily. ) 90 tablet 0  . losartan (COZAAR) 100 MG tablet TAKE ONE (1) TABLET BY MOUTH EVERY DAY (Patient taking differently: Take 100 mg by mouth daily. ) 90 tablet 3  . Multiple Vitamins-Minerals (MULTI COMPLETE PO) Take 1 tablet by mouth daily.     . pantoprazole (PROTONIX) 40 MG tablet TAKE ONE (1) TABLET EACH DAY (Patient taking differently: Take 40 mg by mouth daily. ) 90 tablet 3   No current facility-administered medications on  file  prior to visit.     There are no Patient Instructions on file for this visit. Return in about 3 months (around 10/15/2019) for PAD.   Kris Hartmann, NP  This note was completed with Sales executive.  Any errors are purely unintentional.

## 2019-07-30 ENCOUNTER — Encounter (INDEPENDENT_AMBULATORY_CARE_PROVIDER_SITE_OTHER): Payer: Self-pay

## 2019-08-07 ENCOUNTER — Ambulatory Visit (INDEPENDENT_AMBULATORY_CARE_PROVIDER_SITE_OTHER): Payer: Medicare HMO | Admitting: Vascular Surgery

## 2019-08-07 ENCOUNTER — Encounter (INDEPENDENT_AMBULATORY_CARE_PROVIDER_SITE_OTHER): Payer: Medicare HMO

## 2019-09-10 ENCOUNTER — Encounter (INDEPENDENT_AMBULATORY_CARE_PROVIDER_SITE_OTHER): Payer: Self-pay

## 2019-09-10 NOTE — Telephone Encounter (Signed)
The patient will be coming by tomorrow to pick up samples and she will giving 1 month of Eliquis 5mg 

## 2019-09-10 NOTE — Telephone Encounter (Signed)
That's fine can you coordinate with her?

## 2019-10-14 DIAGNOSIS — E7800 Pure hypercholesterolemia, unspecified: Secondary | ICD-10-CM | POA: Diagnosis not present

## 2019-10-14 DIAGNOSIS — K219 Gastro-esophageal reflux disease without esophagitis: Secondary | ICD-10-CM | POA: Diagnosis not present

## 2019-10-14 DIAGNOSIS — I10 Essential (primary) hypertension: Secondary | ICD-10-CM | POA: Diagnosis not present

## 2019-10-14 DIAGNOSIS — E039 Hypothyroidism, unspecified: Secondary | ICD-10-CM | POA: Diagnosis not present

## 2019-10-20 ENCOUNTER — Other Ambulatory Visit: Payer: Self-pay

## 2019-10-20 ENCOUNTER — Ambulatory Visit (INDEPENDENT_AMBULATORY_CARE_PROVIDER_SITE_OTHER): Payer: Medicare HMO

## 2019-10-20 ENCOUNTER — Ambulatory Visit (INDEPENDENT_AMBULATORY_CARE_PROVIDER_SITE_OTHER): Payer: Medicare HMO | Admitting: Vascular Surgery

## 2019-10-20 ENCOUNTER — Encounter (INDEPENDENT_AMBULATORY_CARE_PROVIDER_SITE_OTHER): Payer: Self-pay | Admitting: Vascular Surgery

## 2019-10-20 VITALS — BP 155/78 | HR 73 | Resp 16 | Wt 166.0 lb

## 2019-10-20 DIAGNOSIS — E785 Hyperlipidemia, unspecified: Secondary | ICD-10-CM | POA: Diagnosis not present

## 2019-10-20 DIAGNOSIS — I10 Essential (primary) hypertension: Secondary | ICD-10-CM

## 2019-10-20 DIAGNOSIS — I70222 Atherosclerosis of native arteries of extremities with rest pain, left leg: Secondary | ICD-10-CM

## 2019-10-20 NOTE — Assessment & Plan Note (Signed)
Her noninvasive studies today shows stable ABIs of 0.64 on the right and 1.09 on the left.  She has what appears to be a chronic right SFA occlusion by duplex today.  Her left leg intervention is patent and no significant left lower extremity stenosis is identified on duplex. At this point, she is a year out from her revascularization.  I am okay switching her from full anticoagulation to Plavix and a new prescription for Plavix was given today.  We will follow her on an annual basis unless worrisome problems develop in the interim.

## 2019-10-20 NOTE — Patient Instructions (Signed)
Peripheral Vascular Disease  Peripheral vascular disease (PVD) is a disease of the blood vessels that are not part of your heart and brain. A simple term for PVD is poor circulation. In most cases, PVD narrows the blood vessels that carry blood from your heart to the rest of your body. This can reduce the supply of blood to your arms, legs, and internal organs, like your stomach or kidneys. However, PVD most often affects a person's lower legs and feet. Without treatment, PVD tends to get worse. PVD can also lead to acute ischemic limb. This is when an arm or leg suddenly cannot get enough blood. This is a medical emergency. Follow these instructions at home: Lifestyle  Do not use any products that contain nicotine or tobacco, such as cigarettes and e-cigarettes. If you need help quitting, ask your doctor.  Lose weight if you are overweight. Or, stay at a healthy weight as told by your doctor.  Eat a diet that is low in fat and cholesterol. If you need help, ask your doctor.  Exercise regularly. Ask your doctor for activities that are right for you. General instructions  Take over-the-counter and prescription medicines only as told by your doctor.  Take good care of your feet: ? Wear comfortable shoes that fit well. ? Check your feet often for any cuts or sores.  Keep all follow-up visits as told by your doctor This is important. Contact a doctor if:  You have cramps in your legs when you walk.  You have leg pain when you are at rest.  You have coldness in a leg or foot.  Your skin changes.  You are unable to get or have an erection (erectile dysfunction).  You have cuts or sores on your feet that do not heal. Get help right away if:  Your arm or leg turns cold, numb, and blue.  Your arms or legs become red, warm, swollen, painful, or numb.  You have chest pain.  You have trouble breathing.  You suddenly have weakness in your face, arm, or leg.  You become very  confused or you cannot speak.  You suddenly have a very bad headache.  You suddenly cannot see. Summary  Peripheral vascular disease (PVD) is a disease of the blood vessels.  A simple term for PVD is poor circulation. Without treatment, PVD tends to get worse.  Treatment may include exercise, low fat and low cholesterol diet, and quitting smoking. This information is not intended to replace advice given to you by your health care provider. Make sure you discuss any questions you have with your health care provider. Document Released: 02/13/2010 Document Revised: 11/01/2017 Document Reviewed: 12/27/2016 Elsevier Patient Education  2020 Elsevier Inc.  

## 2019-10-20 NOTE — Progress Notes (Signed)
MRN : [id number]  Lindsay [name] is a 78 y.o. ([birth year]) female who presents with chief complaint of  Chief Complaint  Patient presents with  . Follow-up    ultrasound follow up  .  History of Present Illness: Patient returns today in follow up of her PAD.  She has had some intermittent pain in her left leg since swelling in the lower leg but nothing severe.  She is about a year status post intervention for acute limb threatening ischemia with good results.  She says she can no longer afford to take the blood thinner she is on because she is now in the donut hole.  Her noninvasive studies today shows stable ABIs of 0.64 on the right and 1.09 on the left.  She has what appears to be a chronic right SFA occlusion by duplex today.  Her left leg intervention is patent and no significant left lower extremity stenosis is identified on duplex.  Current Outpatient Medications  Medication Sig Dispense Refill  . acetaminophen (TYLENOL) 500 MG tablet Take 1,000 mg by mouth daily.    Marland Kitchen amLODipine (NORVASC) 5 MG tablet Take 10 mg by mouth daily.     Marland Kitchen apixaban (ELIQUIS) 5 MG TABS tablet Take 1 tablet (5 mg total) by mouth 2 (two) times daily. 180 tablet 3  . atorvastatin (LIPITOR) 10 MG tablet Take 1 tablet (10 mg total) by mouth daily. 90 tablet 3  . bisoprolol-hydrochlorothiazide (ZIAC) 10-6.25 MG tablet TAKE 1 TABLET BY MOUTH DAILY (Patient taking differently: Take 1 tablet by mouth daily. ) 30 tablet 0  . levothyroxine (SYNTHROID, LEVOTHROID) 50 MCG tablet TAKE ONE (1) TABLET BY MOUTH EVERY DAY (Patient taking differently: Take 50 mcg by mouth daily. ) 90 tablet 0  . losartan (COZAAR) 100 MG tablet TAKE ONE (1) TABLET BY MOUTH EVERY DAY (Patient taking differently: Take 100 mg by mouth daily. ) 90 tablet 3  . Multiple Vitamins-Minerals (MULTI COMPLETE PO) Take 1 tablet by mouth daily.     . pantoprazole (PROTONIX) 40 MG tablet TAKE ONE (1) TABLET EACH DAY (Patient taking differently: Take 40 mg by  mouth daily. ) 90 tablet 3  . DiphenhydrAMINE HCl (ALKA-SELTZER PLUS ALLERGY PO) Take 1 tablet by mouth daily as needed (allergies).     No current facility-administered medications for this visit.     Past Medical History:  Diagnosis Date  . Anemia   . Anxiety   . Arthritis    osteo, back  . Depression   . Diverticulosis   . GERD (gastroesophageal reflux disease)   . Hematochezia   . Hypertension    controlled on meds  . Hypothyroidism   . PAD (peripheral artery disease) ([place])     Past Surgical History:  Procedure Laterality Date  . ABDOMINAL HYSTERECTOMY    . CARDIAC CATHETERIZATION  1995   normal, Dr Clayborn Bigness  . COLONOSCOPY  12/29/08  . COLONOSCOPY N/A 04/18/2015   Procedure: COLONOSCOPY;  Surgeon: Lucilla Lame, MD;  Location: [facility];  Service: Gastroenterology;  Laterality: N/A;  cecum time- 0957  . FOOT SURGERY Right   . KNEE ARTHROSCOPY Right 10/05/2015   Procedure: ARTHROSCOPY KNEE, partial lateral menisectomy;  Surgeon: Earnestine Leys, MD;  Location: [facility];  Service: Orthopedics;  Laterality: Right;  . LOWER EXTREMITY ANGIOGRAPHY Left 02/27/2018   Procedure: LOWER EXTREMITY ANGIOGRAPHY;  Surgeon: Algernon Huxley, MD;  Location: [facility];  Service: Cardiovascular;  Laterality: Left;  . LOWER EXTREMITY ANGIOGRAPHY Left  10/15/2018   Procedure: LOWER EXTREMITY ANGIOGRAPHY;  Surgeon: Algernon Huxley, MD;  Location: [facility];  Service: Cardiovascular;  Laterality: Left;  . LOWER EXTREMITY ANGIOGRAPHY Left 12/29/2018   Procedure: LOWER EXTREMITY ANGIOGRAPHY;  Surgeon: Algernon Huxley, MD;  Location: [facility];  Service: Cardiovascular;  Laterality: Left;  . POLYPECTOMY  04/18/2015   Procedure: POLYPECTOMY INTESTINAL;  Surgeon: Lucilla Lame, MD;  Location: [facility];  Service: Gastroenterology;;  . WRIST SURGERY Right      Social History   Tobacco Use  . Smoking status: Current Every Day Smoker    Packs/day: 0.50    Years:  30.00    Pack years: 15.00    Types: Cigarettes    Last attempt to quit: 10/14/2018    Years since quitting: 1.0  . Smokeless tobacco: Never Used  Substance Use Topics  . Alcohol use: Yes    Alcohol/week: 0.0 standard drinks    Comment: occassionally/ whiskey on weekend; total of 4 drinks per week  . Drug use: No      Family History  Problem Relation Age of Onset  . Diabetes Mother   . Hypertension Mother   . Diabetes Sister   . Hypertension Sister      Allergies  Allergen Reactions  . Aspirin Other (See Comments)    GI BLEED  . Nsaids Other (See Comments)    GI BLEED  . Penicillins Itching, Rash and Other (See Comments)    Has patient had a PCN reaction causing immediate rash, facial/tongue/throat swelling, SOB or lightheadedness with hypotension: Yes Has patient had a PCN reaction causing severe rash involving mucus membranes or skin necrosis: No Has patient had a PCN reaction that required hospitalization No Has patient had a PCN reaction occurring within the last 10 years: No If all of the above answers are "NO", then may proceed with Cephalosporin use.   . Pollen Extract Other (See Comments)    Congestion, Eye Irritation    REVIEW OF SYSTEMS(Negative unless checked)  Constitutional: [] ??Weight loss[] ??Fever[] ??Chills Cardiac:[] ??Chest pain[] ??Chest pressure[] ??Palpitations [] ??Shortness of breath when laying flat [] ??Shortness of breath at rest [] ??Shortness of breath with exertion. Vascular: [x] ??Pain in legs with walking[] ??Pain in legsat rest[] ??Pain in legs when laying flat [] ??Claudication [x] ??Pain in feet when walking [x] ??Pain in feet at rest [] ??Pain in feet when laying flat [] ??History of DVT [] ??Phlebitis [] ??Swelling in legs [] ??Varicose veins [] ??Non-healing ulcers Pulmonary: [] ??Uses home oxygen [] ??Productive cough[] ??Hemoptysis [] ??Wheeze [] ??COPD [] ??Asthma Neurologic: [] ??Dizziness  [] ??Blackouts [] ??Seizures [] ??History of stroke [] ??History of TIA[] ??Aphasia [] ??Temporary blindness[] ??Dysphagia [] ??Weaknessor numbness in arms [] ??Weakness or numbnessin legs Musculoskeletal: [x] ??Arthritis [] ??Joint swelling [] ??Joint pain [] ??Low back pain Hematologic:[] ??Easy bruising[] ??Easy bleeding [] ??Hypercoagulable state [x] ??Anemic [] ??Hepatitis Gastrointestinal:[] ??Blood in stool[] ??Vomiting blood[x] ??Gastroesophageal reflux/heartburn[] ??Abdominal pain Genitourinary: [] ??Chronic kidney disease [] ??Difficulturination [] ??Frequenturination [] ??Burning with urination[] ??Hematuria Skin: [] ??Rashes [] ??Ulcers [] ??Wounds Psychological: [] ??History of anxiety[] ??History of major depression.  Physical Examination  BP (!) 155/78 (BP Location: Right Arm)   Pulse 73   Resp 16   Wt 166 lb (75.3 kg)   BMI 27.62 kg/m  Gen:  WD/WN, NAD.  Appears younger than stated age Head: [place]/AT, No temporalis wasting. Ear/Nose/Throat: Hearing grossly intact, nares w/o erythema or drainage Eyes: Conjunctiva clear. Sclera non-icteric Neck: Supple.  Trachea midline Pulmonary:  Good air movement, no use of accessory muscles.  Cardiac: RRR, no JVD Vascular:  Vessel Right Left  Radial Palpable Palpable  PT  1+ palpable  2+ palpable  DP  1+ palpable  2+ palpable    Musculoskeletal: M/S 5/5 throughout.  No deformity or atrophy.  Trace left lower leg edema. Neurologic: Sensation grossly intact in extremities.  Symmetrical.  Speech is fluent.  Psychiatric: Judgment intact, Mood & affect appropriate for pt's clinical situation. Dermatologic: No rashes or ulcers noted.  No cellulitis or open wounds.       Labs No results found for this or any previous visit (from the past 2160 hour(s)).  Radiology No results found.  Assessment/Plan Hypertension blood pressure control important in reducing the  progression of atherosclerotic disease. On appropriate oral medications.   Hyperlipidemia lipid control important in reducing the progression of atherosclerotic disease. Continue statin therapy  Atherosclerosis of native arteries of extremity with rest pain ([place]) Her noninvasive studies today shows stable ABIs of 0.64 on the right and 1.09 on the left.  She has what appears to be a chronic right SFA occlusion by duplex today.  Her left leg intervention is patent and no significant left lower extremity stenosis is identified on duplex. At this point, she is a year out from her revascularization.  I am okay switching her from full anticoagulation to Plavix and a new prescription for Plavix was given today.  We will follow her on an annual basis unless worrisome problems develop in the interim.    Leotis Pain, MD  10/20/2019 2:39 PM    This note was created with Dragon medical transcription system.  Any errors from dictation are purely unintentional

## 2019-10-22 DIAGNOSIS — R69 Illness, unspecified: Secondary | ICD-10-CM | POA: Diagnosis not present

## 2019-10-22 DIAGNOSIS — K219 Gastro-esophageal reflux disease without esophagitis: Secondary | ICD-10-CM | POA: Diagnosis not present

## 2019-10-22 DIAGNOSIS — E7800 Pure hypercholesterolemia, unspecified: Secondary | ICD-10-CM | POA: Diagnosis not present

## 2019-10-22 DIAGNOSIS — I10 Essential (primary) hypertension: Secondary | ICD-10-CM | POA: Diagnosis not present

## 2019-10-22 DIAGNOSIS — E039 Hypothyroidism, unspecified: Secondary | ICD-10-CM | POA: Diagnosis not present

## 2019-11-18 ENCOUNTER — Encounter (INDEPENDENT_AMBULATORY_CARE_PROVIDER_SITE_OTHER): Payer: Self-pay

## 2019-11-18 ENCOUNTER — Other Ambulatory Visit (INDEPENDENT_AMBULATORY_CARE_PROVIDER_SITE_OTHER): Payer: Self-pay | Admitting: Nurse Practitioner

## 2019-11-19 ENCOUNTER — Other Ambulatory Visit (INDEPENDENT_AMBULATORY_CARE_PROVIDER_SITE_OTHER): Payer: Self-pay | Admitting: Nurse Practitioner

## 2019-11-19 MED ORDER — CLOPIDOGREL BISULFATE 75 MG PO TABS: 75.0000 mg | ORAL_TABLET | Freq: Every day | ORAL | 3 refills | Status: DC

## 2019-12-04 DIAGNOSIS — I82409 Acute embolism and thrombosis of unspecified deep veins of unspecified lower extremity: Secondary | ICD-10-CM

## 2019-12-04 DIAGNOSIS — I82402 Acute embolism and thrombosis of unspecified deep veins of left lower extremity: Secondary | ICD-10-CM

## 2019-12-04 HISTORY — DX: Acute embolism and thrombosis of unspecified deep veins of unspecified lower extremity: I82.409

## 2019-12-04 HISTORY — DX: Acute embolism and thrombosis of unspecified deep veins of left lower extremity: I82.402

## 2019-12-09 ENCOUNTER — Encounter (INDEPENDENT_AMBULATORY_CARE_PROVIDER_SITE_OTHER): Payer: Self-pay

## 2019-12-14 ENCOUNTER — Encounter (INDEPENDENT_AMBULATORY_CARE_PROVIDER_SITE_OTHER): Payer: Self-pay

## 2019-12-20 ENCOUNTER — Encounter (INDEPENDENT_AMBULATORY_CARE_PROVIDER_SITE_OTHER): Payer: Self-pay

## 2019-12-20 ENCOUNTER — Emergency Department: Payer: Medicare HMO

## 2019-12-20 ENCOUNTER — Other Ambulatory Visit: Payer: Self-pay

## 2019-12-20 ENCOUNTER — Encounter: Payer: Self-pay | Admitting: Emergency Medicine

## 2019-12-20 ENCOUNTER — Emergency Department
Admission: EM | Admit: 2019-12-20 | Discharge: 2019-12-20 | Disposition: A | Discharge status: Home / Self Care | Payer: Medicare HMO | Source: Home / Self Care | Attending: Emergency Medicine | Admitting: Emergency Medicine

## 2019-12-20 DIAGNOSIS — I70209 Unspecified atherosclerosis of native arteries of extremities, unspecified extremity: Secondary | ICD-10-CM

## 2019-12-20 DIAGNOSIS — Z79899 Other long term (current) drug therapy: Secondary | ICD-10-CM | POA: Insufficient documentation

## 2019-12-20 DIAGNOSIS — F17210 Nicotine dependence, cigarettes, uncomplicated: Secondary | ICD-10-CM | POA: Insufficient documentation

## 2019-12-20 DIAGNOSIS — Z7901 Long term (current) use of anticoagulants: Secondary | ICD-10-CM | POA: Insufficient documentation

## 2019-12-20 DIAGNOSIS — I998 Other disorder of circulatory system: Secondary | ICD-10-CM | POA: Diagnosis not present

## 2019-12-20 DIAGNOSIS — M79605 Pain in left leg: Secondary | ICD-10-CM

## 2019-12-20 DIAGNOSIS — I70202 Unspecified atherosclerosis of native arteries of extremities, left leg: Secondary | ICD-10-CM | POA: Diagnosis not present

## 2019-12-20 DIAGNOSIS — I10 Essential (primary) hypertension: Secondary | ICD-10-CM | POA: Diagnosis not present

## 2019-12-20 DIAGNOSIS — E039 Hypothyroidism, unspecified: Secondary | ICD-10-CM | POA: Insufficient documentation

## 2019-12-20 DIAGNOSIS — I70203 Unspecified atherosclerosis of native arteries of extremities, bilateral legs: Secondary | ICD-10-CM | POA: Insufficient documentation

## 2019-12-20 DIAGNOSIS — I70222 Atherosclerosis of native arteries of extremities with rest pain, left leg: Secondary | ICD-10-CM | POA: Diagnosis not present

## 2019-12-20 LAB — CBC WITH DIFFERENTIAL/PLATELET
Abs Immature Granulocytes: 0.03 10*3/uL (ref 0.00–0.07)
Basophils Absolute: 0.1 10*3/uL (ref 0.0–0.1)
Basophils Relative: 1 %
Eosinophils Absolute: 0.1 10*3/uL (ref 0.0–0.5)
Eosinophils Relative: 1 %
HCT: 35.4 % — ABNORMAL LOW (ref 36.0–46.0)
Hemoglobin: 12.8 g/dL (ref 12.0–15.0)
Immature Granulocytes: 0 %
Lymphocytes Relative: 19 %
Lymphs Abs: 1.6 10*3/uL (ref 0.7–4.0)
MCH: 30.5 pg (ref 26.0–34.0)
MCHC: 36.2 g/dL — ABNORMAL HIGH (ref 30.0–36.0)
MCV: 84.5 fL (ref 80.0–100.0)
Monocytes Absolute: 0.6 10*3/uL (ref 0.1–1.0)
Monocytes Relative: 7 %
Neutro Abs: 6 10*3/uL (ref 1.7–7.7)
Neutrophils Relative %: 72 %
Platelets: 231 10*3/uL (ref 150–400)
RBC: 4.19 MIL/uL (ref 3.87–5.11)
RDW: 14 % (ref 11.5–15.5)
WBC: 8.3 10*3/uL (ref 4.0–10.5)
nRBC: 0 % (ref 0.0–0.2)

## 2019-12-20 LAB — BASIC METABOLIC PANEL
Anion gap: 11 (ref 5–15)
BUN: 11 mg/dL (ref 8–23)
CO2: 27 mmol/L (ref 22–32)
Calcium: 9.1 mg/dL (ref 8.9–10.3)
Chloride: 104 mmol/L (ref 98–111)
Creatinine, Ser: 0.82 mg/dL (ref 0.44–1.00)
GFR calc Af Amer: >60 mL/min (ref >60)
GFR calc non Af Amer: >60 mL/min (ref >60)
Glucose, Bld: 119 mg/dL — ABNORMAL HIGH (ref 70–99)
Potassium: 4 mmol/L (ref 3.5–5.1)
Sodium: 142 mmol/L (ref 135–145)

## 2019-12-20 MED ORDER — IOHEXOL 350 MG/ML SOLN
125.0000 mL | Freq: Once | INTRAVENOUS | Status: AC | PRN
  Administered 2019-12-20: 125 mL via INTRAVENOUS

## 2019-12-20 NOTE — ED Notes (Signed)
Patient transported to Ultrasound 

## 2019-12-20 NOTE — ED Provider Notes (Signed)
[facility] Emergency Department Provider Note    ____________________________________________   I have reviewed the triage vital signs and the nursing notes.   HISTORY  Chief Complaint Leg Pain   History limited by: Not Limited   HPI Lindsay [name] is a 79 y.o. female who presents to the emergency department today because of concern for left leg pain. The patient first noticed the pain around midnight last night. Not sure if the pain woke her from sleep or if she noticed it when she went to the bathroom. She has associated numbness to her left foot and it feels cold to her. States she has history of blood clots in the past. Currently on plavix. Patient denies any trauma to her leg. Denies any unusual activity recently. Denies any shortness of breath or chest pain.  Records reviewed. Per medical record review patient has a history of PAD.   Past Medical History:  Diagnosis Date  . Anemia   . Anxiety   . Arthritis    osteo, back  . Depression   . Diverticulosis   . GERD (gastroesophageal reflux disease)   . Hematochezia   . Hypertension    controlled on meds  . Hypothyroidism   . PAD (peripheral artery disease) [facility])     Patient Active Problem List   Diagnosis Date Noted  . Degeneration of intervertebral disc at C4-C5 level 07/15/2019  . Claudication ([place]) 10/14/2018  . PAD (peripheral artery disease) ([place]) 06/17/2018  . Hyperlipidemia 06/17/2018  . Atherosclerosis of native arteries of extremity with rest pain ([place]) 02/18/2018  . Pure hypercholesterolemia 11/05/2017  . Osteopenia of multiple sites 10/08/2017  . Gastroesophageal reflux disease without esophagitis 10/08/2017  . Alcohol use 02/14/2017  . Chest pain 02/14/2017  . Unstable angina ([place]) 02/09/2017  . Derangement of knee 01/17/2016  . Herpes zoster 07/12/2015  . Corneal abrasion, left 07/01/2015  . GI bleed 06/25/2015  . Calcific Achilles tendinitis 07/16/2014  . Right knee  pain 07/07/2014  . Tobacco use disorder 03/18/2013  . Anxiety 09/10/2012  . Arthritis 09/10/2012  . Screening for breast cancer 09/10/2012  . Insomnia 02/13/2012  . Hypothyroidism 08/15/2011  . Hypertension 08/15/2011  . Depression 08/15/2011    Past Surgical History:  Procedure Laterality Date  . ABDOMINAL HYSTERECTOMY    . CARDIAC CATHETERIZATION  1995   normal, Dr Clayborn Bigness  . COLONOSCOPY  12/29/08  . COLONOSCOPY N/A 04/18/2015   Procedure: COLONOSCOPY;  Surgeon: Lucilla Lame, MD;  Location: [place];  Service: Gastroenterology;  Laterality: N/A;  cecum time- 0957  . FOOT SURGERY Right   . KNEE ARTHROSCOPY Right 10/05/2015   Procedure: ARTHROSCOPY KNEE, partial lateral menisectomy;  Surgeon: Earnestine Leys, MD;  Location: [facility];  Service: Orthopedics;  Laterality: Right;  . LOWER EXTREMITY ANGIOGRAPHY Left 02/27/2018   Procedure: LOWER EXTREMITY ANGIOGRAPHY;  Surgeon: Algernon Huxley, MD;  Location: [place] [facility];  Service: Cardiovascular;  Laterality: Left;  . LOWER EXTREMITY ANGIOGRAPHY Left 10/15/2018   Procedure: LOWER EXTREMITY ANGIOGRAPHY;  Surgeon: Algernon Huxley, MD;  Location: [place] [facility];  Service: Cardiovascular;  Laterality: Left;  . LOWER EXTREMITY ANGIOGRAPHY Left 12/29/2018   Procedure: LOWER EXTREMITY ANGIOGRAPHY;  Surgeon: Algernon Huxley, MD;  Location: [place] [facility];  Service: Cardiovascular;  Laterality: Left;  . POLYPECTOMY  04/18/2015   Procedure: POLYPECTOMY INTESTINAL;  Surgeon: Lucilla Lame, MD;  Location: [place];  Service: Gastroenterology;;  . WRIST SURGERY Right  Prior to Admission medications   Medication Sig Start Date End Date Taking? Authorizing Provider  acetaminophen (TYLENOL) 500 MG tablet Take 1,000 mg by mouth daily.    [provider]  amLODipine (NORVASC) 5 MG tablet Take 10 mg by mouth daily.     [provider]  atorvastatin (LIPITOR) 10 MG tablet Take 1 tablet (10 mg total) by mouth  daily. 10/03/18 10/20/19  Kris Hartmann, NP  bisoprolol-hydrochlorothiazide (ZIAC) 10-6.25 MG tablet TAKE 1 TABLET BY MOUTH DAILY Patient taking differently: Take 1 tablet by mouth daily.  08/25/18   Burnard Hawthorne, FNP  clopidogrel (PLAVIX) 75 MG tablet Take 1 tablet (75 mg total) by mouth daily. 11/19/19   Kris Hartmann, NP  DiphenhydrAMINE HCl (ALKA-SELTZER PLUS ALLERGY PO) Take 1 tablet by mouth daily as needed (allergies).    [provider]  levothyroxine (SYNTHROID, LEVOTHROID) 50 MCG tablet TAKE ONE (1) TABLET BY MOUTH EVERY DAY Patient taking differently: Take 50 mcg by mouth daily.  05/23/18   Burnard Hawthorne, FNP  losartan (COZAAR) 100 MG tablet TAKE ONE (1) TABLET BY MOUTH EVERY DAY Patient taking differently: Take 100 mg by mouth daily.  07/17/17   Burnard Hawthorne, FNP  Multiple Vitamins-Minerals (MULTI COMPLETE PO) Take 1 tablet by mouth daily.     [provider]  pantoprazole (PROTONIX) 40 MG tablet TAKE ONE (1) TABLET EACH DAY Patient taking differently: Take 40 mg by mouth daily.  07/17/17   Burnard Hawthorne, FNP    Allergies Aspirin, Nsaids, Penicillins, and Pollen extract  Family History  Problem Relation Age of Onset  . Diabetes Mother   . Hypertension Mother   . Diabetes Sister   . Hypertension Sister     Social History Social History   Tobacco Use  . Smoking status: Current Every Day Smoker    Packs/day: 0.50    Years: 30.00    Pack years: 15.00    Types: Cigarettes    Last attempt to quit: 10/14/2018    Years since quitting: 1.1  . Smokeless tobacco: Never Used  Substance Use Topics  . Alcohol use: Yes    Alcohol/week: 0.0 standard drinks    Comment: occassionally/ whiskey on weekend; total of 4 drinks per week  . Drug use: No    Review of Systems Constitutional: No fever/chills Eyes: No visual changes. ENT: No sore throat. Cardiovascular: Denies chest pain. Respiratory: Denies shortness of  breath. Gastrointestinal: No abdominal pain.  No nausea, no vomiting.  No diarrhea.   Genitourinary: Negative for dysuria. Musculoskeletal: Positive for left leg numbness, pain.  Skin: Negative for rash. Neurological: Negative for headaches, focal weakness or numbness.  ____________________________________________   PHYSICAL EXAM:  VITAL SIGNS: ED Triage Vitals [12/20/19 0726]  Enc Vitals Group     BP (!) 178/105     Pulse Rate 87     Resp 16     Temp 98.6 F (37 C)     Temp Source Oral     SpO2 97 %     Weight 160 lb (72.6 kg)     Height 5\' 5"  (1.651 m)     Head Circumference      Peak Flow      Pain Score 5   Constitutional: Alert and oriented.  Eyes: Conjunctivae are normal.  ENT      Head: Normocephalic and atraumatic.      Nose: No congestion/rhinnorhea.      Mouth/Throat: Mucous membranes are moist.  Neck: No stridor. Hematological/Lymphatic/Immunilogical: No cervical lymphadenopathy. Cardiovascular: Normal rate, regular rhythm.  No murmurs, rubs, or gallops.  Respiratory: Normal respiratory effort without tachypnea nor retractions. Breath sounds are clear and equal bilaterally. No wheezes/rales/rhonchi. Gastrointestinal: Soft and non tender. No rebound. No guarding.  Genitourinary: Deferred Musculoskeletal: Left foot cool. No palpable DP pulse. No dopplerable DP pulse. PT pulse dopplerable. No discoloration. Full movement of the left foot.  Neurologic:  Normal speech and language. No gross focal neurologic deficits are appreciated.  Skin:  Skin is warm, dry and intact. No rash noted. Psychiatric: Mood and affect are normal. Speech and behavior are normal. Patient exhibits appropriate insight and judgment.  ____________________________________________    LABS (pertinent positives/negatives)  BMP wnl except glu 119 CBC wbc 8.3, hgb 12.8, plt 231  ____________________________________________   EKG  I, Nance Pear, attending physician, personally  viewed and interpreted this EKG  EKG Time: 0946 Rate: 70 Rhythm: normal sinus rhythm Axis: normal Intervals: qtc 453 QRS: narrow, q waves v1 ST changes: no st elevation Impression: abnormal ekg  ____________________________________________    RADIOLOGY  [place] left lower extremity No visualized DVT. Question possible arterioal occlusion.   CT angio Long segmental occlusion of the left SFA with reconstitution of the left popliteal artery. Long segmental occlusion of the right SFA.   ____________________________________________   PROCEDURES  Procedures  ____________________________________________   INITIAL IMPRESSION / ASSESSMENT AND PLAN / ED COURSE  Pertinent labs & imaging results that were available during my care of the patient were reviewed by me and considered in my medical decision making (see chart for details).   Patient presents to the emergency department today because of concerns for left leg pain and left foot numbness.  On exam patient did not have a palpable or dopplerable dorsalis pedis on the left foot although could get a good posterior tibialis.  No skin changes.  It is slightly cool to touch patient does complain of some numbness.  CT scan was obtained which showed occlusion of the SFA with reconstitution.  I discussed with Dr. Teola Bradley with vascular surgery who reviewed the imaging.  At this time given that the patient has arterial flow below the segment of occlusion and that the patient has full movement and no skin changes felt would be reasonable for patient to follow-up tomorrow vascular clinic.  I discussed this with the patient.  She states that her pain is not very significant and she thinks Tylenol would be sufficient.  Discussed with patient return precautions.   ____________________________________________   FINAL CLINICAL IMPRESSION(S) / ED DIAGNOSES  Final diagnoses:  Left leg pain  Arterial occlusion, lower extremity ([place])     Note: This  dictation was prepared with Dragon dictation. Any transcriptional errors that result from this process are unintentional     Nance Pear, MD 12/20/19 [phone]

## 2019-12-20 NOTE — ED Triage Notes (Signed)
Here for left calf pain starting yesterday. Feels like when had blood clot before. Was taken of eliquis and put on plavix.  Worse with walking. Unlabored.  VSS

## 2019-12-21 ENCOUNTER — Other Ambulatory Visit (INDEPENDENT_AMBULATORY_CARE_PROVIDER_SITE_OTHER): Payer: Self-pay | Admitting: Nurse Practitioner

## 2019-12-21 ENCOUNTER — Encounter (INDEPENDENT_AMBULATORY_CARE_PROVIDER_SITE_OTHER): Payer: Self-pay

## 2019-12-21 ENCOUNTER — Telehealth (INDEPENDENT_AMBULATORY_CARE_PROVIDER_SITE_OTHER): Payer: Self-pay

## 2019-12-21 NOTE — Telephone Encounter (Signed)
Please reach out to Lindsay [name], she will need to come in and be seen, but she doesn't need studies.  She can see me or dew and it should be this week.

## 2019-12-22 ENCOUNTER — Other Ambulatory Visit: Payer: Self-pay

## 2019-12-22 ENCOUNTER — Encounter (INDEPENDENT_AMBULATORY_CARE_PROVIDER_SITE_OTHER): Payer: Self-pay | Admitting: Nurse Practitioner

## 2019-12-22 ENCOUNTER — Ambulatory Visit (INDEPENDENT_AMBULATORY_CARE_PROVIDER_SITE_OTHER): Payer: Medicare HMO | Admitting: Nurse Practitioner

## 2019-12-22 ENCOUNTER — Encounter (INDEPENDENT_AMBULATORY_CARE_PROVIDER_SITE_OTHER): Payer: Self-pay

## 2019-12-22 ENCOUNTER — Other Ambulatory Visit
Admission: RE | Admit: 2019-12-22 | Discharge: 2019-12-22 | Disposition: A | Discharge status: Home / Self Care | Payer: Medicare HMO | Source: Ambulatory Visit | Attending: Vascular Surgery | Admitting: Vascular Surgery

## 2019-12-22 ENCOUNTER — Telehealth (INDEPENDENT_AMBULATORY_CARE_PROVIDER_SITE_OTHER): Payer: Self-pay

## 2019-12-22 VITALS — BP 138/82 | HR 66 | Resp 12 | Ht 65.0 in | Wt 165.0 lb

## 2019-12-22 DIAGNOSIS — F17200 Nicotine dependence, unspecified, uncomplicated: Secondary | ICD-10-CM | POA: Diagnosis not present

## 2019-12-22 DIAGNOSIS — I998 Other disorder of circulatory system: Secondary | ICD-10-CM

## 2019-12-22 DIAGNOSIS — R69 Illness, unspecified: Secondary | ICD-10-CM | POA: Diagnosis not present

## 2019-12-22 DIAGNOSIS — I10 Essential (primary) hypertension: Secondary | ICD-10-CM

## 2019-12-22 LAB — SARS CORONAVIRUS 2 (TAT 6-24 HRS): SARS Coronavirus 2: POSITIVE — AB

## 2019-12-22 MED ORDER — OXYCODONE HCL 5 MG PO TABS: 5.0000 mg | ORAL_TABLET | Freq: Three times a day (TID) | ORAL | 0 refills | Status: DC | PRN

## 2019-12-22 NOTE — Telephone Encounter (Signed)
Patient was seen today in the office and was scheduled with Dr. Lucky Cowboy for a LLE angio on 12/23/19 with a 10:15 am arrival time to the [facility]. Patient will do her covid test today when she leaves the office at the [facility]. Pre-procedure instructions were discussed and paperwork given to the patient.

## 2019-12-22 NOTE — Progress Notes (Signed)
SUBJECTIVE:  Patient ID: Lindsay [name], female    DOB: Aug 19, 1941, 79 y.o.   MRN: [id number] Chief Complaint  Patient presents with  . Follow-up    BLE Foot Pain cold numbness    HPI  Lindsay [name] is a 79 y.o. female that presents today after complaints of pain, cold and numb sensation of her left lower extremity.  The patient originally went to the emergency room on 12/20/2019 with complaints of lower extremity leg pain.  At that time a left lower extremity DVT study was done which revealed no evidence of DVT.  However a CT angio found long segment occlusion of the left SFA and proximal popliteal artery including her stents.  There is collateral reconstitution of the native left popliteal artery just above the knee with posterior tibial runoff.  There is also a long segment occlusion of the right SFA with reconstitution of the popliteal artery proximally and with patent three-vessel runoff.  Today, she reports that her leg is numb and it feels cold and she is constantly aching.  It is been very difficult for her to rest and sleep due to the constant pain.  And she does note that the pain is worse when she lays down. Past Medical History:  Diagnosis Date  . Anemia   . Anxiety   . Arthritis    osteo, back  . Depression   . Diverticulosis   . GERD (gastroesophageal reflux disease)   . Hematochezia   . Hypertension    controlled on meds  . Hypothyroidism   . PAD (peripheral artery disease) ([place])     Past Surgical History:  Procedure Laterality Date  . ABDOMINAL HYSTERECTOMY    . CARDIAC CATHETERIZATION  1995   normal, Dr Clayborn Bigness  . COLONOSCOPY  12/29/08  . COLONOSCOPY N/A 04/18/2015   Procedure: COLONOSCOPY;  Surgeon: Lucilla Lame, MD;  Location: [place];  Service: Gastroenterology;  Laterality: N/A;  cecum time- 0957  . FOOT SURGERY Right   . KNEE ARTHROSCOPY Right 10/05/2015   Procedure: ARTHROSCOPY KNEE, partial lateral menisectomy;  Surgeon: Earnestine Leys, MD;   Location: [facility];  Service: Orthopedics;  Laterality: Right;  . LOWER EXTREMITY ANGIOGRAPHY Left 02/27/2018   Procedure: LOWER EXTREMITY ANGIOGRAPHY;  Surgeon: Algernon Huxley, MD;  Location: [place] [facility];  Service: Cardiovascular;  Laterality: Left;  . LOWER EXTREMITY ANGIOGRAPHY Left 10/15/2018   Procedure: LOWER EXTREMITY ANGIOGRAPHY;  Surgeon: Algernon Huxley, MD;  Location: [place] [facility];  Service: Cardiovascular;  Laterality: Left;  . LOWER EXTREMITY ANGIOGRAPHY Left 12/29/2018   Procedure: LOWER EXTREMITY ANGIOGRAPHY;  Surgeon: Algernon Huxley, MD;  Location: [place] [facility];  Service: Cardiovascular;  Laterality: Left;  . POLYPECTOMY  04/18/2015   Procedure: POLYPECTOMY INTESTINAL;  Surgeon: Lucilla Lame, MD;  Location: [place];  Service: Gastroenterology;;  . WRIST SURGERY Right     Social History   Socioeconomic History  . Marital status: Married    Spouse name: Not on file  . Number of children: Not on file  . Years of education: Not on file  . Highest education level: Not on file  Occupational History  . Not on file  Tobacco Use  . Smoking status: Current Every Day Smoker    Packs/day: 0.50    Years: 30.00    Pack years: 15.00    Types: Cigarettes    Last attempt to quit: 10/14/2018    Years since quitting: 1.1  . Smokeless tobacco:  Never Used  Substance and Sexual Activity  . Alcohol use: Yes    Alcohol/week: 0.0 standard drinks    Comment: occassionally/ whiskey on weekend; total of 4 drinks per week  . Drug use: No  . Sexual activity: Not Currently  Other Topics Concern  . Not on file  Social History Narrative   From [place].   [place]- 20 hours week.    Husband in nursing home in [place].    1 child; 3 step children   Lives by herself, independent         Social Determinants of Health   Financial Resource Strain:   . Difficulty of Paying Living Expenses: Not on file  Food Insecurity:   . Worried  About Charity fundraiser in the Last Year: Not on file  . Ran Out of Food in the Last Year: Not on file  Transportation Needs:   . Lack of Transportation (Medical): Not on file  . Lack of Transportation (Non-Medical): Not on file  Physical Activity:   . Days of Exercise per Week: Not on file  . Minutes of Exercise per Session: Not on file  Stress:   . Feeling of Stress : Not on file  Social Connections:   . Frequency of Communication with Friends and Family: Not on file  . Frequency of Social Gatherings with Friends and Family: Not on file  . Attends Religious Services: Not on file  . Active Member of Clubs or Organizations: Not on file  . Attends Archivist Meetings: Not on file  . Marital Status: Not on file  Intimate Partner Violence:   . Fear of Current or Ex-Partner: Not on file  . Emotionally Abused: Not on file  . Physically Abused: Not on file  . Sexually Abused: Not on file    Family History  Problem Relation Age of Onset  . Diabetes Mother   . Hypertension Mother   . Diabetes Sister   . Hypertension Sister     Allergies  Allergen Reactions  . Aspirin Other (See Comments)    GI BLEED  . Nsaids Other (See Comments)    GI BLEED  . Penicillins Itching, Rash and Other (See Comments)    Has patient had a PCN reaction causing immediate rash, facial/tongue/throat swelling, SOB or lightheadedness with hypotension: Yes Has patient had a PCN reaction causing severe rash involving mucus membranes or skin necrosis: No Has patient had a PCN reaction that required hospitalization No Has patient had a PCN reaction occurring within the last 10 years: No If all of the above answers are "NO", then may proceed with Cephalosporin use.   . Pollen Extract Other (See Comments)    Congestion, Eye Irritation     Review of Systems   Review of Systems: Negative Unless Checked Constitutional: [] Weight loss  [] Fever  [] Chills Cardiac: [] Chest pain   []  Atrial Fibrillation   [] Palpitations   [] Shortness of breath when laying flat   [] Shortness of breath with exertion. [] Shortness of breath at rest Vascular:  [] Pain in legs with walking   [] Pain in legs with standing [] Pain in legs when laying flat   [] Claudication    [] Pain in feet when laying flat    [] History of DVT   [] Phlebitis   [] Swelling in legs   [] Varicose veins   [] Non-healing ulcers Pulmonary:   [] Uses home oxygen   [] Productive cough   [] Hemoptysis   [] Wheeze  [] COPD   [] Asthma Neurologic:  []   Dizziness   [] Seizures  [] Blackouts [] History of stroke   [] History of TIA  [] Aphasia   [] Temporary Blindness   [] Weakness or numbness in arm   [] Weakness or numbness in leg Musculoskeletal:   [] Joint swelling   [] Joint pain   [] Low back pain  []  History of Knee Replacement [x] Arthritis [] back Surgeries  []  Spinal Stenosis    Hematologic:  [] Easy bruising  [] Easy bleeding   [] Hypercoagulable state   [] Anemic Gastrointestinal:  [] Diarrhea   [] Vomiting  [x] Gastroesophageal reflux/heartburn   [] Difficulty swallowing. [] Abdominal pain Genitourinary:  [] Chronic kidney disease   [] Difficult urination  [] Anuric   [] Blood in urine [] Frequent urination  [] Burning with urination   [] Hematuria Skin:  [] Rashes   [] Ulcers [] Wounds Psychological:  [x] History of anxiety   [x]  History of major depression  []  Memory Difficulties      OBJECTIVE:   Physical Exam  BP 138/82 (BP Location: Right Arm)   Pulse 66   Resp 12   Ht 5\' 5"  (1.651 m)   Wt 165 lb (74.8 kg)   BMI 27.46 kg/m   Gen: WD/WN, NAD Head: [place]/AT, No temporalis wasting.  Ear/Nose/Throat: Hearing grossly intact, nares w/o erythema or drainage Eyes: PER, EOMI, sclera nonicteric.  Neck: Supple, no masses.  No JVD.  Pulmonary:  Good air movement, no use of accessory muscles.  Cardiac: RRR Vascular:  Left foot cold, bottom of foot bluish discoloration Vessel Right Left  Dorsalis Pedis  Not Palpable  Posterior Tibial  Not Palpable   Gastrointestinal: soft,  non-distended. No guarding/no peritoneal signs.  Musculoskeletal: M/S 5/5 throughout.  No deformity or atrophy.  Neurologic: Pain and light touch intact in extremities.  Symmetrical.  Speech is fluent. Motor exam as listed above. Psychiatric: Judgment intact, Mood & affect appropriate for pt's clinical situation.        ASSESSMENT AND PLAN:  1. Ischemic leg Recommend:  The patient has evidence of severe atherosclerotic changes of both lower extremities with rest pain that is associated with preulcerative changes and impending tissue loss of the foot.  This represents a limb threatening ischemia and places the patient at the risk for limb loss.  Patient should undergo angiography of the lower extremities with the hope for intervention for limb salvage.  The risks and benefits as well as the alternative therapies was discussed in detail with the patient.  All questions were answered.  Patient agrees to proceed with angiography.  The patient will follow up with me in the office after the procedure.       2. Tobacco use disorder Continued tobacco usage will decrease longevity of in her interventions done.  Smoking cessation encouraged.  3. Essential hypertension Good blood pressure control today.  Blood pressure control is essential for atherosclerotic disease management.  On appropriate medications no changes made today.   Current Outpatient Medications on File Prior to Visit  Medication Sig Dispense Refill  . acetaminophen (TYLENOL) 500 MG tablet Take 1,000 mg by mouth daily.    Marland Kitchen amLODipine (NORVASC) 5 MG tablet Take 10 mg by mouth daily.     . bisoprolol-hydrochlorothiazide (ZIAC) 10-6.25 MG tablet TAKE 1 TABLET BY MOUTH DAILY (Patient taking differently: Take 1 tablet by mouth daily. ) 30 tablet 0  . clopidogrel (PLAVIX) 75 MG tablet Take 1 tablet (75 mg total) by mouth daily. 90 tablet 3  . levothyroxine (SYNTHROID, LEVOTHROID) 50 MCG tablet TAKE ONE (1) TABLET BY MOUTH EVERY DAY  (Patient taking differently: Take 50 mcg by mouth daily. )  90 tablet 0  . losartan (COZAAR) 100 MG tablet TAKE ONE (1) TABLET BY MOUTH EVERY DAY (Patient taking differently: Take 100 mg by mouth daily. ) 90 tablet 3  . Multiple Vitamins-Minerals (MULTI COMPLETE PO) Take 1 tablet by mouth daily.     . pantoprazole (PROTONIX) 40 MG tablet TAKE ONE (1) TABLET EACH DAY (Patient taking differently: Take 40 mg by mouth daily. ) 90 tablet 3  . atorvastatin (LIPITOR) 10 MG tablet Take 1 tablet (10 mg total) by mouth daily. 90 tablet 3  . DiphenhydrAMINE HCl (ALKA-SELTZER PLUS ALLERGY PO) Take 1 tablet by mouth daily as needed (allergies).     No current facility-administered medications on file prior to visit.    There are no Patient Instructions on file for this visit. No follow-ups on file.   Kris Hartmann, NP  This note was completed with Sales executive.  Any errors are purely unintentional.

## 2019-12-23 ENCOUNTER — Inpatient Hospital Stay
Admission: RE | Admit: 2019-12-23 | Discharge: 2019-12-25 | DRG: 272 | Disposition: A | Discharge status: Home Health | Payer: Medicare HMO | Attending: Vascular Surgery | Admitting: Vascular Surgery

## 2019-12-23 ENCOUNTER — Other Ambulatory Visit: Payer: Self-pay

## 2019-12-23 ENCOUNTER — Other Ambulatory Visit (INDEPENDENT_AMBULATORY_CARE_PROVIDER_SITE_OTHER): Payer: Self-pay | Admitting: Nurse Practitioner

## 2019-12-23 ENCOUNTER — Encounter (INDEPENDENT_AMBULATORY_CARE_PROVIDER_SITE_OTHER): Payer: Self-pay

## 2019-12-23 ENCOUNTER — Encounter: Payer: Self-pay | Admitting: Vascular Surgery

## 2019-12-23 ENCOUNTER — Encounter
Admission: RE | Disposition: A | Discharge status: Home / Self Care | Payer: Self-pay | Source: Home / Self Care | Attending: Vascular Surgery

## 2019-12-23 ENCOUNTER — Telehealth: Payer: Self-pay | Admitting: Adult Health

## 2019-12-23 DIAGNOSIS — Z833 Family history of diabetes mellitus: Secondary | ICD-10-CM

## 2019-12-23 DIAGNOSIS — F17210 Nicotine dependence, cigarettes, uncomplicated: Secondary | ICD-10-CM | POA: Diagnosis present

## 2019-12-23 DIAGNOSIS — F329 Major depressive disorder, single episode, unspecified: Secondary | ICD-10-CM | POA: Diagnosis present

## 2019-12-23 DIAGNOSIS — Z86718 Personal history of other venous thrombosis and embolism: Secondary | ICD-10-CM

## 2019-12-23 DIAGNOSIS — I10 Essential (primary) hypertension: Secondary | ICD-10-CM | POA: Diagnosis present

## 2019-12-23 DIAGNOSIS — Z79899 Other long term (current) drug therapy: Secondary | ICD-10-CM

## 2019-12-23 DIAGNOSIS — Z95820 Peripheral vascular angioplasty status with implants and grafts: Secondary | ICD-10-CM

## 2019-12-23 DIAGNOSIS — Z886 Allergy status to analgesic agent status: Secondary | ICD-10-CM | POA: Diagnosis not present

## 2019-12-23 DIAGNOSIS — Z90710 Acquired absence of both cervix and uterus: Secondary | ICD-10-CM | POA: Diagnosis not present

## 2019-12-23 DIAGNOSIS — Z7902 Long term (current) use of antithrombotics/antiplatelets: Secondary | ICD-10-CM | POA: Diagnosis not present

## 2019-12-23 DIAGNOSIS — Z888 Allergy status to other drugs, medicaments and biological substances status: Secondary | ICD-10-CM | POA: Diagnosis not present

## 2019-12-23 DIAGNOSIS — U071 COVID-19: Secondary | ICD-10-CM | POA: Diagnosis present

## 2019-12-23 DIAGNOSIS — I70222 Atherosclerosis of native arteries of extremities with rest pain, left leg: Principal | ICD-10-CM | POA: Diagnosis present

## 2019-12-23 DIAGNOSIS — I998 Other disorder of circulatory system: Secondary | ICD-10-CM | POA: Diagnosis present

## 2019-12-23 DIAGNOSIS — Z79890 Hormone replacement therapy (postmenopausal): Secondary | ICD-10-CM | POA: Diagnosis not present

## 2019-12-23 DIAGNOSIS — F419 Anxiety disorder, unspecified: Secondary | ICD-10-CM | POA: Diagnosis present

## 2019-12-23 DIAGNOSIS — E039 Hypothyroidism, unspecified: Secondary | ICD-10-CM | POA: Diagnosis present

## 2019-12-23 DIAGNOSIS — K219 Gastro-esophageal reflux disease without esophagitis: Secondary | ICD-10-CM | POA: Diagnosis present

## 2019-12-23 DIAGNOSIS — Z880 Allergy status to penicillin: Secondary | ICD-10-CM | POA: Diagnosis not present

## 2019-12-23 DIAGNOSIS — Z8249 Family history of ischemic heart disease and other diseases of the circulatory system: Secondary | ICD-10-CM | POA: Diagnosis not present

## 2019-12-23 DIAGNOSIS — M1990 Unspecified osteoarthritis, unspecified site: Secondary | ICD-10-CM | POA: Diagnosis present

## 2019-12-23 HISTORY — PX: LOWER EXTREMITY ANGIOGRAPHY: CATH118251

## 2019-12-23 LAB — CBC
HCT: 34.8 % — ABNORMAL LOW (ref 36.0–46.0)
Hemoglobin: 12.6 g/dL (ref 12.0–15.0)
MCH: 30.6 pg (ref 26.0–34.0)
MCHC: 36.2 g/dL — ABNORMAL HIGH (ref 30.0–36.0)
MCV: 84.5 fL (ref 80.0–100.0)
Platelets: 208 10*3/uL (ref 150–400)
RBC: 4.12 MIL/uL (ref 3.87–5.11)
RDW: 13.6 % (ref 11.5–15.5)
WBC: 11 10*3/uL — ABNORMAL HIGH (ref 4.0–10.5)
nRBC: 0 % (ref 0.0–0.2)

## 2019-12-23 LAB — BUN: BUN: 10 mg/dL (ref 8–23)

## 2019-12-23 LAB — CREATININE, SERUM
Creatinine, Ser: 0.66 mg/dL (ref 0.44–1.00)
GFR calc Af Amer: >60 mL/min (ref >60)
GFR calc non Af Amer: >60 mL/min (ref >60)

## 2019-12-23 LAB — FIBRINOGEN: Fibrinogen: 344 mg/dL (ref 210–475)

## 2019-12-23 LAB — MRSA PCR SCREENING: MRSA by PCR: NEGATIVE

## 2019-12-23 LAB — HEPARIN LEVEL (UNFRACTIONATED): Heparin Unfractionated: 0.31 IU/mL (ref 0.30–0.70)

## 2019-12-23 SURGERY — LOWER EXTREMITY ANGIOGRAPHY
Anesthesia: Moderate Sedation | Laterality: Left

## 2019-12-23 MED ORDER — ATORVASTATIN CALCIUM 10 MG PO TABS
10.0000 mg | ORAL_TABLET | Freq: Every day | ORAL | Status: DC
  Administered 2019-12-24 – 2019-12-25 (×2): 10 mg via ORAL
  Filled 2019-12-23 (×2): qty 1

## 2019-12-23 MED ORDER — HYDROMORPHONE HCL 1 MG/ML IJ SOLN
1.0000 mg | INTRAMUSCULAR | Status: DC | PRN
  Administered 2019-12-23: 1 mg via INTRAVENOUS

## 2019-12-23 MED ORDER — MIDAZOLAM HCL 2 MG/2ML IJ SOLN
INTRAMUSCULAR | Status: DC | PRN
  Administered 2019-12-23: 2 mg via INTRAVENOUS
  Administered 2019-12-23: 1 mg via INTRAVENOUS

## 2019-12-23 MED ORDER — SODIUM CHLORIDE 0.9 % IV SOLN: INTRAVENOUS | Status: DC

## 2019-12-23 MED ORDER — FAMOTIDINE 20 MG PO TABS: 40.0000 mg | ORAL_TABLET | Freq: Once | ORAL | Status: DC | PRN

## 2019-12-23 MED ORDER — ONDANSETRON HCL 4 MG/2ML IJ SOLN: 4.0000 mg | Freq: Four times a day (QID) | INTRAMUSCULAR | Status: DC | PRN

## 2019-12-23 MED ORDER — CLINDAMYCIN PHOSPHATE 300 MG/50ML IV SOLN
INTRAVENOUS | Status: AC
  Filled 2019-12-23: qty 50

## 2019-12-23 MED ORDER — OXYCODONE HCL 5 MG PO TABS
5.0000 mg | ORAL_TABLET | ORAL | Status: DC | PRN
  Administered 2019-12-24 (×3): 5 mg via ORAL
  Filled 2019-12-23 (×3): qty 1

## 2019-12-23 MED ORDER — BISOPROLOL-HYDROCHLOROTHIAZIDE 10-6.25 MG PO TABS
1.0000 | ORAL_TABLET | Freq: Every day | ORAL | Status: DC
  Administered 2019-12-24 – 2019-12-25 (×2): 1 via ORAL
  Filled 2019-12-23 (×2): qty 1

## 2019-12-23 MED ORDER — HEPARIN (PORCINE) 25000 UT/250ML-% IV SOLN
INTRAVENOUS | Status: AC
  Administered 2019-12-23: 600 [IU]/h via INTRAVENOUS
  Filled 2019-12-23: qty 250

## 2019-12-23 MED ORDER — HEPARIN SODIUM (PORCINE) 1000 UNIT/ML IJ SOLN
INTRAMUSCULAR | Status: AC
  Filled 2019-12-23: qty 1

## 2019-12-23 MED ORDER — DIPHENHYDRAMINE HCL 50 MG/ML IJ SOLN: 50.0000 mg | Freq: Once | INTRAMUSCULAR | Status: DC | PRN

## 2019-12-23 MED ORDER — SODIUM CHLORIDE 0.9 % IV SOLN: 250.0000 mL | INTRAVENOUS | Status: DC | PRN

## 2019-12-23 MED ORDER — ADULT MULTIVITAMIN W/MINERALS CH
1.0000 | ORAL_TABLET | Freq: Every day | ORAL | Status: DC
  Administered 2019-12-24 – 2019-12-25 (×2): 1 via ORAL
  Filled 2019-12-23 (×2): qty 1

## 2019-12-23 MED ORDER — METHYLPREDNISOLONE SODIUM SUCC 125 MG IJ SOLR: 125.0000 mg | Freq: Once | INTRAMUSCULAR | Status: DC | PRN

## 2019-12-23 MED ORDER — SODIUM CHLORIDE 0.9 % IV SOLN
0.5000 mg/h | Freq: Once | INTRAVENOUS | Status: AC
  Administered 2019-12-23: 0.5 mg/h via INTRAVENOUS
  Filled 2019-12-23: qty 24

## 2019-12-23 MED ORDER — MIDAZOLAM HCL 2 MG/ML PO SYRP
ORAL_SOLUTION | ORAL | Status: AC
  Filled 2019-12-23: qty 4

## 2019-12-23 MED ORDER — AMLODIPINE BESYLATE 10 MG PO TABS
10.0000 mg | ORAL_TABLET | Freq: Every day | ORAL | Status: DC
  Administered 2019-12-24 – 2019-12-25 (×2): 10 mg via ORAL
  Filled 2019-12-23: qty 2
  Filled 2019-12-23: qty 1

## 2019-12-23 MED ORDER — ACETAMINOPHEN 500 MG PO TABS
1000.0000 mg | ORAL_TABLET | Freq: Four times a day (QID) | ORAL | Status: AC
  Administered 2019-12-23 – 2019-12-24 (×2): 1000 mg via ORAL
  Filled 2019-12-23 (×2): qty 2

## 2019-12-23 MED ORDER — MIDAZOLAM HCL 5 MG/5ML IJ SOLN
INTRAMUSCULAR | Status: AC
  Filled 2019-12-23: qty 5

## 2019-12-23 MED ORDER — MORPHINE SULFATE (PF) 2 MG/ML IV SOLN: 2.0000 mg | INTRAVENOUS | Status: DC | PRN

## 2019-12-23 MED ORDER — MIDAZOLAM HCL 2 MG/ML PO SYRP
8.0000 mg | ORAL_SOLUTION | Freq: Once | ORAL | Status: AC | PRN
  Administered 2019-12-23: 4 mg via ORAL

## 2019-12-23 MED ORDER — OXYCODONE-ACETAMINOPHEN 5-325 MG PO TABS: 1.0000 | ORAL_TABLET | ORAL | Status: DC | PRN

## 2019-12-23 MED ORDER — FENTANYL CITRATE (PF) 100 MCG/2ML IJ SOLN
INTRAMUSCULAR | Status: DC | PRN
  Administered 2019-12-23: 25 ug via INTRAVENOUS
  Administered 2019-12-23: 50 ug via INTRAVENOUS

## 2019-12-23 MED ORDER — HEPARIN (PORCINE) 25000 UT/250ML-% IV SOLN: 600.0000 [IU]/h | INTRAVENOUS | Status: DC

## 2019-12-23 MED ORDER — HYDROMORPHONE HCL 1 MG/ML IJ SOLN
INTRAMUSCULAR | Status: AC
  Administered 2019-12-23: 1 mg via INTRAVENOUS
  Filled 2019-12-23: qty 1

## 2019-12-23 MED ORDER — CLINDAMYCIN PHOSPHATE 300 MG/50ML IV SOLN: 300.0000 mg | Freq: Once | INTRAVENOUS | Status: AC

## 2019-12-23 MED ORDER — SODIUM CHLORIDE 0.9% FLUSH
3.0000 mL | Freq: Two times a day (BID) | INTRAVENOUS | Status: DC
  Administered 2019-12-23 – 2019-12-25 (×3): 3 mL via INTRAVENOUS

## 2019-12-23 MED ORDER — FENTANYL CITRATE (PF) 100 MCG/2ML IJ SOLN
INTRAMUSCULAR | Status: AC
  Filled 2019-12-23: qty 2

## 2019-12-23 MED ORDER — SODIUM CHLORIDE 0.9% FLUSH: 3.0000 mL | INTRAVENOUS | Status: DC | PRN

## 2019-12-23 MED ORDER — KETOROLAC TROMETHAMINE 15 MG/ML IJ SOLN
15.0000 mg | Freq: Four times a day (QID) | INTRAMUSCULAR | Status: AC
  Administered 2019-12-23 – 2019-12-24 (×2): 15 mg via INTRAVENOUS
  Filled 2019-12-23 (×2): qty 1

## 2019-12-23 MED ORDER — ALTEPLASE 2 MG IJ SOLR
INTRAMUSCULAR | Status: DC | PRN
  Administered 2019-12-23: 4 mg
  Administered 2019-12-23: 2 mg

## 2019-12-23 MED ORDER — HEPARIN SODIUM (PORCINE) 1000 UNIT/ML IJ SOLN
INTRAMUSCULAR | Status: DC | PRN
  Administered 2019-12-23: 5000 [IU] via INTRAVENOUS

## 2019-12-23 MED ORDER — LOSARTAN POTASSIUM 50 MG PO TABS
100.0000 mg | ORAL_TABLET | Freq: Every day | ORAL | Status: DC
  Administered 2019-12-24 – 2019-12-25 (×2): 100 mg via ORAL
  Filled 2019-12-23 (×2): qty 2

## 2019-12-23 MED ORDER — HYDROMORPHONE HCL 1 MG/ML IJ SOLN: 1.0000 mg | Freq: Once | INTRAMUSCULAR | Status: AC | PRN

## 2019-12-23 MED ORDER — LEVOTHYROXINE SODIUM 50 MCG PO TABS
50.0000 ug | ORAL_TABLET | Freq: Every day | ORAL | Status: DC
  Administered 2019-12-24 – 2019-12-25 (×2): 50 ug via ORAL
  Filled 2019-12-23 (×2): qty 1

## 2019-12-23 MED ORDER — HYDROMORPHONE HCL 1 MG/ML IJ SOLN
0.5000 mg | INTRAMUSCULAR | Status: DC | PRN
  Filled 2019-12-23: qty 1

## 2019-12-23 MED ORDER — CLINDAMYCIN PHOSPHATE 300 MG/50ML IV SOLN
INTRAVENOUS | Status: AC
  Administered 2019-12-23: 300 mg via INTRAVENOUS
  Filled 2019-12-23: qty 50

## 2019-12-23 SURGICAL SUPPLY — 17 items
BALLN ULTRVRSE 5X300X150 (BALLOONS) ×2
BALLOON ULTRVRSE 5X300X150 (BALLOONS) IMPLANT
CANISTER PENUMBRA ENGINE (MISCELLANEOUS) ×1 IMPLANT
CATH BEACON 5 .038 100 VERT TP (CATHETERS) ×1 IMPLANT
CATH INDIGO CAT6 KIT (CATHETERS) ×1 IMPLANT
CATH INFUS 135CMX50CM (CATHETERS) ×1 IMPLANT
CATH PIG 70CM (CATHETERS) ×1 IMPLANT
DEVICE PRESTO INFLATION (MISCELLANEOUS) ×1 IMPLANT
GLIDEWIRE ADV .035X260CM (WIRE) ×1 IMPLANT
KIT CATH CVC 3 LUMEN 7FR 8IN (MISCELLANEOUS) ×1 IMPLANT
PACK ANGIOGRAPHY (CUSTOM PROCEDURE TRAY) ×2 IMPLANT
SHEATH BRITE TIP 5FRX11 (SHEATH) ×1 IMPLANT
SHEATH PINNACLE ST 6F 45CM (SHEATH) ×1 IMPLANT
SYR MEDRAD MARK 7 150ML (SYRINGE) ×1 IMPLANT
TUBING CONTRAST HIGH PRESS 72 (TUBING) ×1 IMPLANT
WIRE G V18X300CM (WIRE) ×1 IMPLANT
WIRE J 3MM .035X145CM (WIRE) ×1 IMPLANT

## 2019-12-23 NOTE — H&P (Signed)
[facility] History & Physical Update  The patient was interviewed and re-examined.  The patient's previous History and Physical has been reviewed and is unchanged.  There is no change in the plan of care. We plan to proceed with the scheduled procedure.  Leotis Pain, MD  12/23/2019, 11:46 AM

## 2019-12-23 NOTE — OR Nursing (Signed)
Son called with update

## 2019-12-23 NOTE — Telephone Encounter (Signed)
Called to discuss with Lindsay [name] about Covid symptoms and the use of bamlanivimab, a monoclonal antibody infusion for those with mild to moderate Covid symptoms and at a high risk of hospitalization.     Pt is qualified for this infusion at the [facility] infusion center due to co-morbid conditions and/or a member of an at-risk group . Preventative practices reviewed. Patient verbalized understanding.  Callback number to the infusion hotline given. Patient advised to go to Urgent care or ED with severe symptoms. Last date she  would be eligible for infusion is 01/01/20.    Pt does not qualify for infusion therapy as she  has asymptomatic infection. Isolation precautions discussed. Advised to contact back for consideration should she  develop symptoms. Patient verbalized understanding.      Patient Active Problem List   Diagnosis Date Noted  . Degeneration of intervertebral disc at C4-C5 level 07/15/2019  . Claudication ([place]) 10/14/2018  . PAD (peripheral artery disease) ([place]) 06/17/2018  . Hyperlipidemia 06/17/2018  . Atherosclerosis of native arteries of extremity with rest pain ([place]) 02/18/2018  . Pure hypercholesterolemia 11/05/2017  . Osteopenia of multiple sites 10/08/2017  . Gastroesophageal reflux disease without esophagitis 10/08/2017  . Alcohol use 02/14/2017  . Chest pain 02/14/2017  . Unstable angina ([place]) 02/09/2017  . Derangement of knee 01/17/2016  . Herpes zoster 07/12/2015  . Corneal abrasion, left 07/01/2015  . GI bleed 06/25/2015  . Calcific Achilles tendinitis 07/16/2014  . Right knee pain 07/07/2014  . Tobacco use disorder 03/18/2013  . Anxiety 09/10/2012  . Arthritis 09/10/2012  . Screening for breast cancer 09/10/2012  . Insomnia 02/13/2012  . Hypothyroidism 08/15/2011  . Hypertension 08/15/2011  . Depression 08/15/2011    Damion Kant NP-C  [facility]   12/23/2019

## 2019-12-23 NOTE — Op Note (Signed)
[facility]  Percutaneous Study/Intervention Procedural Note   Date of Surgery: 12/23/2019  Surgeon(s):Taylen Osorto    Assistants:none  Pre-operative Diagnosis: PAD with rest pain left lower extremity, acute on chronic ischemia  Post-operative diagnosis:  Same  Procedure(s) Performed:             1.  Ultrasound guidance for vascular access right femoral artery             2.  Catheter placement into left posterior tibial artery from right femoral approach             3.  Aortogram and selective left lower extremity angiogram             4.   Catheter directed thrombolytic therapy with a total of 6 mg of TPA instilled in the left SFA and popliteal arteries             5.   Mechanical thrombectomy of the left SFA, popliteal artery, and tibioperoneal trunk with penumbra cat 6 device  6.  Angioplasty of the left SFA and popliteal arteries with 5 mm diameter by 30 cm length angioplasty balloon             7.   Placement of infusion catheter for continuous thrombolytic therapy using a 130 cm total length 50 cm working length catheter from the proximal posterior tibial artery up through the tibioperoneal trunk, popliteal artery, and up to the proximal superficial femoral artery  8.   Ultrasound guidance for vascular access right femoral vein  9.   Placement of the venous triple-lumen catheter for venous access in the right femoral vein with ultrasound guidance  EBL: 200 cc  Contrast: 30 cc  Fluoro Time: 7.5 minutes  Moderate Conscious Sedation Time: approximately 60 minutes using 3 mg of Versed and 75 mcg of Fentanyl              Indications:  Patient is a 79 y.o.female with rest pain of the left foot with acute on chronic ischemia. The patient has noninvasive study showing a occlusion of her left SFA and popliteal stents with a drop in her perfusion from her studies a few months ago. The patient is brought in for angiography for further evaluation and potential  treatment.  Due to the limb threatening nature of the situation, angiogram was performed for attempted limb salvage. The patient is aware that if the procedure fails, amputation would be expected.  The patient also understands that even with successful revascularization, amputation may still be required due to the severity of the situation.  Risks and benefits are discussed and informed consent is obtained.   Procedure:  The patient was identified and appropriate procedural time out was performed.  The patient was then placed supine on the table and prepped and draped in the usual sterile fashion. Moderate conscious sedation was administered during a face to face encounter with the patient throughout the procedure with my supervision of the RN administering medicines and monitoring the patient's vital signs, pulse oximetry, telemetry and mental status throughout from the start of the procedure until the patient was taken to the recovery room. Ultrasound was used to evaluate the right common femoral artery.  It was patent .  A digital ultrasound image was acquired.  A Seldinger needle was used to access the right common femoral artery under direct ultrasound guidance and a permanent image was performed.  A 0.035 J wire was advanced without resistance and a 5Fr sheath  was placed.  Pigtail catheter was placed into the aorta and an AP aortogram was performed. This demonstrated that the renal arteries were widely patent.  Aorta is widely patent.  Previous right iliac artery stenosis that was treated with angioplasty was quite mild in the 20 to 30% range.  There was about a 30% left common iliac artery stenosis that was not flow-limiting.  Both external iliac arteries were widely patent. I then crossed the aortic bifurcation and advanced to the left femoral head. Selective left lower extremity angiogram was then performed. This demonstrated abrupt occlusion of the SFA a few centimeters beyond the origin and above the  previously placed stents.  The stents were all occluded.  There appeared to be reconstitution of the popliteal artery tibioperoneal trunk although flow was sluggish and difficult to opacify.  On initial imaging there appeared to be peroneal runoff distally and that was it.  Once we crossed the occlusion and evaluated the posterior tibial are slowly provided runoff below the occlusion. It was felt that it was in the patient's best interest to proceed with intervention after these images to avoid a second procedure and a larger amount of contrast and fluoroscopy based off of the findings from the initial angiogram. The patient was systemically heparinized and a 6 [place] destination sheath was then placed over the Terumo Advantage wire. I then used a Kumpe catheter and instilled 4 mg of TPA in the SFA and popliteal arteries and then use the Kumpe catheter and the advantage wire to easily cross the occlusion and get down into the tibioperoneal trunk and proximal posterior tibial artery.  Imaging showed both the peroneal artery and posterior tibial artery to have runoff distally.  I then exchanged for a 0.018 wire.  The penumbra cat 6 catheter was then brought onto the field and multiple passes were made in the left SFA, popliteal artery, and down into the tibioperoneal trunk with the penumbra cat 6 device.  Large amounts of thrombus were removed but there still remained very sluggish flow.  After several passes, I felt an attempted angioplasty to see if there were stenosis proximal or distal to the lesion that could be opened and provide flow.  2 inflations with a 5 mm diameter by 30 cm length angioplasty balloon were done from the popliteal artery just below the knee up to the proximal superficial femoral artery.  Relations were 12 atm for 1 minute.  Completion imaging showed residual thrombus and sluggish flow distally and I felt our best option for limb salvage at this point would be a continuous infusion of  thrombolytic therapy. I elected to to place a 135 cm total length 50 cm working length thrombolytic catheter that was parked into the proximal posterior tibial artery distally and terminated in the proximal SFA above the thrombosis proximally.  An additional 2 mg of TPA was instilled in this catheter and then an infusion will be begun in the recovery room.  The catheter and the sheath were secured with silk sutures.  To provide venous access for blood draws and avoid needle sticks and ensure that durable venous access is present during thrombolysis, a central line was placed.  The right femoral vein was visualized with ultrasound and found to be widely patent.  It was accessed under direct ultrasound guidance without difficulty with a Seldinger needle.  A J-wire was placed.  After dilatation a triple-lumen catheter was placed over the wire and the wire was removed.  All 3 lm withdrew blood  well and flushed easily with heparinized saline.  This was then secured in place with 2 silk sutures.  The patient was taken to the recovery room in stable condition having tolerated the procedure well.  Findings:               Aortogram:  Renal arteries were widely patent.  Aorta is widely patent.  Previous right iliac artery stenosis that was treated with angioplasty was quite mild in the 20 to 30% range.  There was about a 30% left common iliac artery stenosis that was not flow-limiting.  Both external iliac arteries were widely patent.             Left lower Extremity:  Renal arteries were widely patent.  Aorta is widely patent.  Previous right iliac artery stenosis that was treated with angioplasty was quite mild in the 20 to 30% range.  There was about a 30% left common iliac artery stenosis that was not flow-limiting.  Both external iliac arteries were widely patent.   Disposition: Patient was taken to the recovery room in stable condition having tolerated the procedure well.  Complications: None  Leotis Pain 12/23/2019 5:45 PM   This note was created with Dragon Medical transcription system. Any errors in dictation are purely unintentional.

## 2019-12-23 NOTE — Progress Notes (Signed)
Pt arrived on unit at this time. VSS. Heparin gtt infusing. Altepase infusing. Report received.

## 2019-12-24 ENCOUNTER — Encounter
Admission: RE | Disposition: A | Discharge status: Home / Self Care | Payer: Self-pay | Source: Home / Self Care | Attending: Vascular Surgery

## 2019-12-24 ENCOUNTER — Encounter: Payer: Self-pay | Admitting: Cardiology

## 2019-12-24 ENCOUNTER — Other Ambulatory Visit (INDEPENDENT_AMBULATORY_CARE_PROVIDER_SITE_OTHER): Payer: Self-pay | Admitting: Vascular Surgery

## 2019-12-24 DIAGNOSIS — I70222 Atherosclerosis of native arteries of extremities with rest pain, left leg: Secondary | ICD-10-CM

## 2019-12-24 HISTORY — PX: LOWER EXTREMITY ANGIOGRAPHY: CATH118251

## 2019-12-24 LAB — BASIC METABOLIC PANEL
Anion gap: 10 (ref 5–15)
BUN: 11 mg/dL (ref 8–23)
CO2: 27 mmol/L (ref 22–32)
Calcium: 8.7 mg/dL — ABNORMAL LOW (ref 8.9–10.3)
Chloride: 103 mmol/L (ref 98–111)
Creatinine, Ser: 0.56 mg/dL (ref 0.44–1.00)
GFR calc Af Amer: >60 mL/min (ref >60)
GFR calc non Af Amer: >60 mL/min (ref >60)
Glucose, Bld: 128 mg/dL — ABNORMAL HIGH (ref 70–99)
Potassium: 3.2 mmol/L — ABNORMAL LOW (ref 3.5–5.1)
Sodium: 140 mmol/L (ref 135–145)

## 2019-12-24 LAB — CBC
HCT: 31.2 % — ABNORMAL LOW (ref 36.0–46.0)
HCT: 31.2 % — ABNORMAL LOW (ref 36.0–46.0)
HCT: 31.6 % — ABNORMAL LOW (ref 36.0–46.0)
Hemoglobin: 11.3 g/dL — ABNORMAL LOW (ref 12.0–15.0)
Hemoglobin: 11.3 g/dL — ABNORMAL LOW (ref 12.0–15.0)
Hemoglobin: 11.4 g/dL — ABNORMAL LOW (ref 12.0–15.0)
MCH: 30.6 pg (ref 26.0–34.0)
MCH: 30.6 pg (ref 26.0–34.0)
MCH: 30.7 pg (ref 26.0–34.0)
MCHC: 36.1 g/dL — ABNORMAL HIGH (ref 30.0–36.0)
MCHC: 36.2 g/dL — ABNORMAL HIGH (ref 30.0–36.0)
MCHC: 36.2 g/dL — ABNORMAL HIGH (ref 30.0–36.0)
MCV: 84.6 fL (ref 80.0–100.0)
MCV: 84.8 fL (ref 80.0–100.0)
MCV: 84.9 fL (ref 80.0–100.0)
Platelets: 159 10*3/uL (ref 150–400)
Platelets: 159 10*3/uL (ref 150–400)
Platelets: 162 10*3/uL (ref 150–400)
RBC: 3.68 MIL/uL — ABNORMAL LOW (ref 3.87–5.11)
RBC: 3.69 MIL/uL — ABNORMAL LOW (ref 3.87–5.11)
RBC: 3.72 MIL/uL — ABNORMAL LOW (ref 3.87–5.11)
RDW: 13.5 % (ref 11.5–15.5)
RDW: 13.5 % (ref 11.5–15.5)
RDW: 13.8 % (ref 11.5–15.5)
WBC: 10.4 10*3/uL (ref 4.0–10.5)
WBC: 10.8 10*3/uL — ABNORMAL HIGH (ref 4.0–10.5)
WBC: 8.3 10*3/uL (ref 4.0–10.5)
nRBC: 0 % (ref 0.0–0.2)
nRBC: 0 % (ref 0.0–0.2)
nRBC: 0 % (ref 0.0–0.2)

## 2019-12-24 LAB — FIBRINOGEN
Fibrinogen: 320 mg/dL (ref 210–475)
Fibrinogen: 288 mg/dL (ref 210–475)
Fibrinogen: 257 mg/dL (ref 210–475)

## 2019-12-24 LAB — HEPARIN LEVEL (UNFRACTIONATED)
Heparin Unfractionated: <0.1 IU/mL — ABNORMAL LOW (ref 0.30–0.70)
Heparin Unfractionated: <0.1 IU/mL — ABNORMAL LOW (ref 0.30–0.70)
Heparin Unfractionated: <0.1 IU/mL — ABNORMAL LOW (ref 0.30–0.70)

## 2019-12-24 SURGERY — LOWER EXTREMITY ANGIOGRAPHY
Anesthesia: Moderate Sedation | Laterality: Left

## 2019-12-24 MED ORDER — CLINDAMYCIN PHOSPHATE 300 MG/50ML IV SOLN
300.0000 mg | Freq: Once | INTRAVENOUS | Status: DC
  Filled 2019-12-24: qty 50

## 2019-12-24 MED ORDER — APIXABAN 5 MG PO TABS
5.0000 mg | ORAL_TABLET | Freq: Two times a day (BID) | ORAL | Status: DC
  Administered 2019-12-25: 5 mg via ORAL
  Filled 2019-12-24 (×2): qty 1

## 2019-12-24 MED ORDER — MIDAZOLAM HCL 2 MG/ML PO SYRP
8.0000 mg | ORAL_SOLUTION | Freq: Once | ORAL | Status: DC | PRN
  Filled 2019-12-24: qty 4

## 2019-12-24 MED ORDER — FENTANYL CITRATE (PF) 100 MCG/2ML IJ SOLN
INTRAMUSCULAR | Status: DC | PRN
  Administered 2019-12-24 (×2): 50 ug via INTRAVENOUS

## 2019-12-24 MED ORDER — TIROFIBAN (AGGRASTAT) BOLUS VIA INFUSION
25.0000 ug/kg | Freq: Once | INTRAVENOUS | Status: AC
  Administered 2019-12-24: 1862.5 ug via INTRAVENOUS
  Filled 2019-12-24: qty 38

## 2019-12-24 MED ORDER — KETOROLAC TROMETHAMINE 15 MG/ML IJ SOLN
15.0000 mg | Freq: Four times a day (QID) | INTRAMUSCULAR | Status: DC
  Administered 2019-12-24: 15 mg via INTRAVENOUS
  Filled 2019-12-24 (×2): qty 1

## 2019-12-24 MED ORDER — METHYLPREDNISOLONE SODIUM SUCC 125 MG IJ SOLR: 125.0000 mg | Freq: Once | INTRAMUSCULAR | Status: DC | PRN

## 2019-12-24 MED ORDER — SODIUM CHLORIDE 0.9 % IV SOLN: INTRAVENOUS | Status: DC

## 2019-12-24 MED ORDER — MIDAZOLAM HCL 2 MG/2ML IJ SOLN
INTRAMUSCULAR | Status: AC
  Filled 2019-12-24: qty 4

## 2019-12-24 MED ORDER — CHLORHEXIDINE GLUCONATE CLOTH 2 % EX PADS
6.0000 | MEDICATED_PAD | Freq: Every day | CUTANEOUS | Status: DC
  Administered 2019-12-24: 6 via TOPICAL

## 2019-12-24 MED ORDER — FAMOTIDINE 20 MG PO TABS: 40.0000 mg | ORAL_TABLET | Freq: Once | ORAL | Status: DC | PRN

## 2019-12-24 MED ORDER — CLINDAMYCIN PHOSPHATE 300 MG/50ML IV SOLN
INTRAVENOUS | Status: AC
  Filled 2019-12-24: qty 50

## 2019-12-24 MED ORDER — HEPARIN SODIUM (PORCINE) 1000 UNIT/ML IJ SOLN
INTRAMUSCULAR | Status: DC | PRN
  Administered 2019-12-24: 3000 [IU] via INTRAVENOUS

## 2019-12-24 MED ORDER — HEPARIN SODIUM (PORCINE) 1000 UNIT/ML IJ SOLN
INTRAMUSCULAR | Status: AC
  Filled 2019-12-24: qty 1

## 2019-12-24 MED ORDER — DIPHENHYDRAMINE HCL 50 MG/ML IJ SOLN: 50.0000 mg | Freq: Once | INTRAMUSCULAR | Status: DC | PRN

## 2019-12-24 MED ORDER — HYDROMORPHONE HCL 1 MG/ML IJ SOLN: 1.0000 mg | Freq: Once | INTRAMUSCULAR | Status: DC | PRN

## 2019-12-24 MED ORDER — POTASSIUM CHLORIDE CRYS ER 20 MEQ PO TBCR
40.0000 meq | EXTENDED_RELEASE_TABLET | ORAL | Status: AC
  Administered 2019-12-24: 40 meq via ORAL
  Filled 2019-12-24: qty 2

## 2019-12-24 MED ORDER — ONDANSETRON HCL 4 MG/2ML IJ SOLN: 4.0000 mg | Freq: Four times a day (QID) | INTRAMUSCULAR | Status: DC | PRN

## 2019-12-24 MED ORDER — FENTANYL CITRATE (PF) 100 MCG/2ML IJ SOLN
INTRAMUSCULAR | Status: AC
  Filled 2019-12-24: qty 4

## 2019-12-24 MED ORDER — ACETAMINOPHEN 500 MG PO TABS
1000.0000 mg | ORAL_TABLET | Freq: Four times a day (QID) | ORAL | Status: AC
  Administered 2019-12-24 (×2): 1000 mg via ORAL
  Filled 2019-12-24 (×2): qty 2

## 2019-12-24 MED ORDER — IODIXANOL 320 MG/ML IV SOLN
INTRAVENOUS | Status: DC | PRN
  Administered 2019-12-24: 75 mL

## 2019-12-24 MED ORDER — MIDAZOLAM HCL 2 MG/2ML IJ SOLN
INTRAMUSCULAR | Status: DC | PRN
  Administered 2019-12-24: 2 mg via INTRAVENOUS

## 2019-12-24 MED ORDER — TIROFIBAN HCL IN NACL 5-0.9 MG/100ML-% IV SOLN
0.1500 ug/kg/min | INTRAVENOUS | Status: DC
  Administered 2019-12-24 – 2019-12-25 (×2): 0.15 ug/kg/min via INTRAVENOUS
  Filled 2019-12-24 (×6): qty 100

## 2019-12-24 SURGICAL SUPPLY — 14 items
BALLN LUTONIX 018 5X60X130 (BALLOONS) ×2
BALLN ULTRVRSE 018 2.5X150X150 (BALLOONS) ×2
BALLOON LUTONIX 018 5X60X130 (BALLOONS) ×1 IMPLANT
BALLOON ULTRVS 018 2.5X150X150 (BALLOONS) ×1 IMPLANT
CANISTER PENUMBRA ENGINE (MISCELLANEOUS) ×2 IMPLANT
CATH CXI SUPP ANG 4FR 135 (CATHETERS) ×1 IMPLANT
CATH CXI SUPP ANG 4FR 135CM (CATHETERS) ×2
CATH INDIGO CAT6 KIT (CATHETERS) ×2 IMPLANT
DEVICE PRESTO INFLATION (MISCELLANEOUS) ×2 IMPLANT
DEVICE STARCLOSE SE CLOSURE (Vascular Products) ×2 IMPLANT
GUIDEWIRE PFTE-COATED .018X300 (WIRE) ×2 IMPLANT
PACK ANGIOGRAPHY (CUSTOM PROCEDURE TRAY) ×2 IMPLANT
WIRE G V18X300CM (WIRE) ×2 IMPLANT
WIRE J 3MM .035X145CM (WIRE) ×2 IMPLANT

## 2019-12-24 NOTE — Op Note (Signed)
[facility]  Percutaneous Study/Intervention Procedural Note   Date of Surgery: 12/24/2019  Surgeon(s):DEW,JASON    Assistants:none  Pre-operative Diagnosis: PAD with rest pain left lower extremity, status post overnight thrombolytic therapy  Post-operative diagnosis:  Same  Procedure(s) Performed:             1.   Left lower extremity angiogram including selective images of the posterior tibial artery and the peroneal artery             2.  Catheter placement into left posterior tibial and peroneal arteries             3.   Percutaneous transluminal angioplasty of the left popliteal artery with 5 mm diameter by 6 cm length Lutonix drug-coated angioplasty balloon             4.  Percutaneous transluminal angioplasty of left mid and distal posterior tibial artery with 2.5 mm diameter by 15 cm length angioplasty balloon             5.   Mechanical thrombectomy to the left peroneal artery with penumbra cat 6 device  6.   StarClose closure device right femoral artery  EBL: 50 cc  Contrast: 75 cc  Fluoro Time: 8.2 minutes  Moderate Conscious Sedation Time: approximately 30 minutes using 2 mg of Versed and 100 mcg of Fentanyl              Indications:  Patient is a 79 y.o.female with critical limb ischemia status post overnight thrombolytic therapy. The patient is brought in for angiography for further evaluation and potential treatment.  Due to the limb threatening nature of the situation, angiogram was performed for attempted limb salvage. The patient is aware that if the procedure fails, amputation would be expected.  The patient also understands that even with successful revascularization, amputation may still be required due to the severity of the situation.  Risks and benefits are discussed and informed consent is obtained.   Procedure:  The patient was identified and appropriate procedural time out was performed.  The patient was then placed supine on the  table and prepped and draped in the usual sterile fashion. Moderate conscious sedation was administered during a face to face encounter with the patient throughout the procedure with my supervision of the RN administering medicines and monitoring the patient's vital signs, pulse oximetry, telemetry and mental status throughout from the start of the procedure until the patient was taken to the recovery room.  The infusion wire was removed from the catheter and selective imaging of the posterior tibial artery showed the mid and distal stenosis with the worst areas being in the 80 to 90% range.  I then placed a wire all the way down into the foot and used the sheath to image the remainder of the left lower extremity. This demonstrated that the thrombus in the SFA and popliteal arteries had resolved.  There was a stenosis just below the previous stent in the popliteal artery in the 70 to 75% range.  The posterior tibial artery was patent proximally but had diffuse disease in the mid and distal portion in the 80 to 90% range at the worst.  The peroneal artery had an abrupt occlusion about 2 to 3 cm beyond its origin clearly from thrombus.  The anterior tibial artery was occluded. I then proceeded with angioplasty of the popliteal stenosis in the distal and mid posterior tibial artery stenosis.  For the posterior tibial artery  a 2.5 mm diameter by 15 cm length angioplasty balloon is inflated to 10 atm for 1 minute.  For the popliteal artery a 5 mm diameter by 6 cm length Lutonix drug-coated angioplasty balloon was inflated to 10 atm for 1 minute.  The popliteal lesion had about a 20% residual stenosis in the stenosis in the distal posterior tibial artery was in the 20 to 25% range as well but the flow was markedly improved to the foot.  I did feel an attempt at trying to clear the peroneal artery to give her second runoff vessel was prudent.  Using a CXI catheter and 0.018 advantage wire I was able to cannulate the  peroneal artery and selective imaging did show thrombotic occlusion of the peroneal artery.  The penumbra cat 6 device was then run with 2 passes done through the peroneal artery with chunks of thrombus removed.  Completion imaging showed markedly improved flow in the peroneal artery as well with only some distal thrombus seen just above the ankle.  I elected to terminate the procedure. The sheath was removed and StarClose closure device was deployed in the right femoral artery with excellent hemostatic result. The patient was taken to the recovery room in stable condition having tolerated the procedure well.  Findings:                           Left Lower Extremity:  Thrombus in the SFA and popliteal arteries had resolved.  There was a stenosis just below the previous stent in the popliteal artery in the 70 to 75% range.  The posterior tibial artery was patent proximally but had diffuse disease in the mid and distal portion in the 80 to 90% range at the worst.  The peroneal artery had an abrupt occlusion about 2 to 3 cm beyond its origin clearly from thrombus.  The anterior tibial artery was occluded.   Disposition: Patient was taken to the recovery room in stable condition having tolerated the procedure well.  Complications: None  Leotis Pain 12/24/2019 3:28 PM   This note was created with Dragon Medical transcription system. Any errors in dictation are purely unintentional.

## 2019-12-25 ENCOUNTER — Encounter (INDEPENDENT_AMBULATORY_CARE_PROVIDER_SITE_OTHER): Payer: Self-pay

## 2019-12-25 ENCOUNTER — Encounter: Payer: Self-pay | Admitting: Cardiology

## 2019-12-25 DIAGNOSIS — I998 Other disorder of circulatory system: Secondary | ICD-10-CM

## 2019-12-25 DIAGNOSIS — I70222 Atherosclerosis of native arteries of extremities with rest pain, left leg: Principal | ICD-10-CM

## 2019-12-25 LAB — BASIC METABOLIC PANEL
Anion gap: 8 (ref 5–15)
BUN: 8 mg/dL (ref 8–23)
CO2: 27 mmol/L (ref 22–32)
Calcium: 8.3 mg/dL — ABNORMAL LOW (ref 8.9–10.3)
Chloride: 108 mmol/L (ref 98–111)
Creatinine, Ser: 0.68 mg/dL (ref 0.44–1.00)
GFR calc Af Amer: >60 mL/min (ref >60)
GFR calc non Af Amer: >60 mL/min (ref >60)
Glucose, Bld: 116 mg/dL — ABNORMAL HIGH (ref 70–99)
Potassium: 3.3 mmol/L — ABNORMAL LOW (ref 3.5–5.1)
Sodium: 143 mmol/L (ref 135–145)

## 2019-12-25 LAB — CBC
HCT: 29.2 % — ABNORMAL LOW (ref 36.0–46.0)
Hemoglobin: 10.5 g/dL — ABNORMAL LOW (ref 12.0–15.0)
MCH: 30.6 pg (ref 26.0–34.0)
MCHC: 36 g/dL (ref 30.0–36.0)
MCV: 85.1 fL (ref 80.0–100.0)
Platelets: 146 10*3/uL — ABNORMAL LOW (ref 150–400)
RBC: 3.43 MIL/uL — ABNORMAL LOW (ref 3.87–5.11)
RDW: 13.6 % (ref 11.5–15.5)
WBC: 7.7 10*3/uL (ref 4.0–10.5)
nRBC: 0 % (ref 0.0–0.2)

## 2019-12-25 LAB — MAGNESIUM: Magnesium: 1.9 mg/dL (ref 1.7–2.4)

## 2019-12-25 MED ORDER — NICOTINE 21 MG/24HR TD PT24: 21.0000 mg | MEDICATED_PATCH | Freq: Every day | TRANSDERMAL | 0 refills | Status: DC

## 2019-12-25 MED ORDER — NICOTINE 21 MG/24HR TD PT24: 21.0000 mg | MEDICATED_PATCH | Freq: Every day | TRANSDERMAL | Status: DC

## 2019-12-25 MED ORDER — POTASSIUM CHLORIDE CRYS ER 20 MEQ PO TBCR
40.0000 meq | EXTENDED_RELEASE_TABLET | Freq: Once | ORAL | Status: AC
  Administered 2019-12-25: 40 meq via ORAL
  Filled 2019-12-25: qty 2

## 2019-12-25 MED ORDER — OXYCODONE HCL 5 MG PO TABS: ORAL_TABLET | ORAL | 0 refills | Status: DC

## 2019-12-25 MED ORDER — CHLORHEXIDINE GLUCONATE CLOTH 2 % EX PADS: 6.0000 | MEDICATED_PAD | Freq: Every day | CUTANEOUS | Status: DC

## 2019-12-25 MED ORDER — APIXABAN 5 MG PO TABS: 5.0000 mg | ORAL_TABLET | Freq: Two times a day (BID) | ORAL | 5 refills | Status: DC

## 2019-12-25 NOTE — TOC Initial Note (Addendum)
Transition of Care [facility]) - Initial/Assessment Note    Patient Details  Name: Lindsay [name] MRN: [id number] Date of Birth: Dec 18, 1940  Transition [facility] Care [facility]) CM/SW Contact:    Candie Chroman, LCSW Phone Number: 12/25/2019, 2:42 PM  Clinical Narrative: Patient under COVID isolation precautions. CSW called patient in room, introduced role, and explained that PT recommendations would be discussed. Patient agreeable to HHPT. No agency preference. [facility] unable to accept. Left message for [facility] representative. She is also agreeable to rolling walker. Sent secure email to [facility] representative with order information. Patient's son will pick her up today.          3:19 pm: [facility] unable to accept. [facility] is checking to see if they can accept. They do not see COVID positive patients over the weekend so they could not start until Monday.          Expected Discharge Plan: [place] Barriers to Discharge: Barriers Resolved   Patient Goals and CMS Choice        Expected Discharge Plan and Services Expected Discharge Plan: [place] Acute Care Choice: Home Health, Durable Medical Equipment Living arrangements for the past 2 months: Single Family Home Expected Discharge Date: 12/25/19               DME Arranged: Gilford Rile rolling DME Agency: [facility] Date DME Agency Contacted: 12/25/19   Representative spoke with at DME Agency: Sunday [facility] (Secure email)            Prior Living Arrangements/Services Living arrangements for the past 2 months: Single Family Home   Patient language and need for interpreter reviewed:: Yes Do you feel safe going back to the place where you live?: Yes      Need for Family Participation in Patient Care: Yes (Comment) Care giver support system in place?: Yes (comment)   Criminal Activity/Legal Involvement Pertinent to Current Situation/Hospitalization: No - Comment as needed  Activities of  Daily Living Home Assistive Devices/Equipment: Gilford Rile (specify type) ADL Screening (condition at time of admission) Patient's cognitive ability adequate to safely complete daily activities?: Yes Is the patient deaf or have difficulty hearing?: No Does the patient have difficulty seeing, even when wearing glasses/contacts?: No Does the patient have difficulty concentrating, remembering, or making decisions?: No Patient able to express need for assistance with ADLs?: Yes Does the patient have difficulty dressing or bathing?: No Independently performs ADLs?: Yes (appropriate for developmental age) Does the patient have difficulty walking or climbing stairs?: No Weakness of Legs: None Weakness of Arms/Hands: None  Permission Sought/Granted Permission sought to share information with : Facility Art therapist granted to share information with : Yes, Verbal Permission Granted     Permission granted to share info w AGENCY: Home Health Agencies        Emotional Assessment Appearance:: Appears stated age Attitude/Demeanor/Rapport: Engaged, Gracious Affect (typically observed): Accepting, Appropriate, Calm, Pleasant Orientation: : Oriented to Self, Oriented to Place, Oriented to  Time, Oriented to Situation Alcohol / Substance Use: Not Applicable Psych Involvement: No (comment)  Admission diagnosis:  Ischemic leg [I99.8] Patient Active Problem List   Diagnosis Date Noted  . Ischemic leg 12/23/2019  . Degeneration of intervertebral disc at C4-C5 level 07/15/2019  . Claudication ([place]) 10/14/2018  . PAD (peripheral artery disease) ([place]) 06/17/2018  . Hyperlipidemia 06/17/2018  . Atherosclerosis of native arteries of extremity with rest pain ([place]) 02/18/2018  . Pure hypercholesterolemia 11/05/2017  .  Osteopenia of multiple sites 10/08/2017  . Gastroesophageal reflux disease without esophagitis 10/08/2017  . Alcohol use 02/14/2017  . Chest pain 02/14/2017  . Unstable  angina ([place]) 02/09/2017  . Derangement of knee 01/17/2016  . Herpes zoster 07/12/2015  . Corneal abrasion, left 07/01/2015  . GI bleed 06/25/2015  . Calcific Achilles tendinitis 07/16/2014  . Right knee pain 07/07/2014  . Tobacco use disorder 03/18/2013  . Anxiety 09/10/2012  . Arthritis 09/10/2012  . Screening for breast cancer 09/10/2012  . Insomnia 02/13/2012  . Hypothyroidism 08/15/2011  . Hypertension 08/15/2011  . Depression 08/15/2011   PCP:  Dion Body, MD Pharmacy:   [facility] [address], [place] [address] Phone: [phone] Fax: [place], [place] [address] [place] [address] Phone: [phone] Fax: [place], [place] [address] Phone: [phone] Fax: [phone]     Social Determinants of Health (SDOH) Interventions    Readmission Risk Interventions No flowsheet data found.

## 2019-12-25 NOTE — TOC Transition Note (Signed)
Transition of Care [facility]) - CM/SW Discharge Note   Patient Details  Name: Lindsay [name] MRN: [id number] Date of Birth: 04-11-1941  Transition of Care [facility]) CM/SW Contact:  Candie Chroman, LCSW Phone Number: 12/25/2019, 4:10 PM   Clinical Narrative:  Patient has been accepted by [facility] for HHPT. Asked nurse and secretary to let her know. Patient is in process of leaving. No further concerns. CSW signing off.   Final next level of care: [place] Barriers to Discharge: Barriers Resolved   Patient Goals and CMS Choice     Choice offered to / list presented to : Patient  Discharge Placement                Patient to be transferred to facility by: Son will transport her home.   Patient and family notified of of transfer: 12/25/19  Discharge Plan and Services     Post Acute Care Choice: [place], Durable Medical Equipment          DME Arranged: Walker rolling DME Agency: [facility] Date DME Agency Contacted: 12/25/19   Representative spoke with at DME Agency: Sunday Corn (Secure email) [place]: PT [place] Agency: [place] ([place]) Date [place]: 12/25/19   Representative spoke with at [place]: Floydene Flock  Social Determinants of Health (SDOH) Interventions     Readmission Risk Interventions No flowsheet data found.

## 2019-12-25 NOTE — Evaluation (Signed)
Physical Therapy Evaluation Patient Details Name: Lindsay [name] MRN: [id number] DOB: Oct 08, 1941 Today's Date: 12/25/2019   History of Present Illness  Pt admitted for ischemic leg and is now s/p L fem tib angioplasty with thrombectomy on 12/24/19. History includes PAD, GERD, anxiety, HTN, and depression. Pt also with positive covid.   Clinical Impression  Pt is a pleasant 79 year old female who was admitted for ischemic leg and is now s/p L fem tib angioplasty and thrombectomy on 12/24/19. Pt performs transfers and ambulation with cga and RW. First attempted standing without AD, however pain increased to 6/10. Gave RW and educated on proper technique. Pt then able to ambulate in room safely with decreased pain. Pt demonstrates deficits with strength/pain/mobility. Would benefit from skilled PT to address above deficits and promote optimal return to PLOF. Recommend transition to [facility] upon discharge from acute hospitalization.     Follow Up Recommendations Home health PT    Equipment Recommendations  Rolling walker with 5" wheels    Recommendations for Other Services       Precautions / Restrictions Precautions Precautions: Fall Restrictions Weight Bearing Restrictions: No      Mobility  Bed Mobility               General bed mobility comments: not performed as pt received in recliner  Transfers Overall transfer level: Needs assistance Equipment used: Rolling walker (2 wheeled) Transfers: Sit to/from Stand Sit to Stand: Min guard         General transfer comment: safe technique with ability to push from seated surface. Once standing, decreased WBing noted on L LE.  Ambulation/Gait Ambulation/Gait assistance: Min guard Gait Distance (Feet): 40 Feet Assistive device: Rolling walker (2 wheeled) Gait Pattern/deviations: Step-to pattern     General Gait Details: ambulated using RW with limited WBing on L LE. Safe technique with RW  Stairs             Wheelchair Mobility    Modified Rankin (Stroke Patients Only)       Balance Overall balance assessment: Modified Independent                                           Pertinent Vitals/Pain Pain Assessment: 0-10 Pain Score: 2  Pain Location: L LE Pain Descriptors / Indicators: Operative site guarding Pain Intervention(s): Limited activity within patient's tolerance    Home Living Family/patient expects to be discharged to:: Private residence Living Arrangements: Alone Available Help at Discharge: Family;Available 24 hours/day(will stay with her s/p discharge) Type of Home: House Home Access: Stairs to enter Entrance Stairs-Rails: Can reach both Entrance Stairs-Number of Steps: 5 Home Layout: One level Home Equipment: Cane - single point      Prior Function Level of Independence: Independent         Comments: reports no falls and was indep prior to admission     Hand Dominance        Extremity/Trunk Assessment   Upper Extremity Assessment Upper Extremity Assessment: Overall WFL for tasks assessed    Lower Extremity Assessment Lower Extremity Assessment: Generalized weakness(B LE grossly 4/5 L limited by pain)       Communication   Communication: No difficulties  Cognition Arousal/Alertness: Awake/alert Behavior During Therapy: WFL for tasks assessed/performed Overall Cognitive Status: Within Functional Limits for tasks assessed  General Comments      Exercises     Assessment/Plan    PT Assessment Patient needs continued PT services  PT Problem List Decreased strength;Decreased balance;Decreased mobility;Pain       PT Treatment Interventions Gait training;Therapeutic exercise;DME instruction    PT Goals (Current goals can be found in the Care Plan section)  Acute Rehab PT Goals Patient Stated Goal: to go home today PT Goal Formulation: With patient Time For Goal  Achievement: 01/08/20 Potential to Achieve Goals: Good    Frequency Min 2X/week   Barriers to discharge        Co-evaluation               AM-PAC PT "6 Clicks" Mobility  Outcome Measure Help needed turning from your back to your side while in a flat bed without using bedrails?: None Help needed moving from lying on your back to sitting on the side of a flat bed without using bedrails?: None Help needed moving to and from a bed to a chair (including a wheelchair)?: None Help needed standing up from a chair using your arms (e.g., wheelchair or bedside chair)?: A Little Help needed to walk in hospital room?: A Little Help needed climbing 3-5 steps with a railing? : A Little 6 Click Score: 21    End of Session Equipment Utilized During Treatment: Gait belt Activity Tolerance: Patient tolerated treatment well Patient left: in chair Nurse Communication: Mobility status PT Visit Diagnosis: Muscle weakness (generalized) (M62.81);Difficulty in walking, not elsewhere classified (R26.2);Pain Pain - Right/Left: Left Pain - part of body: Leg    Time: [id number] PT Time Calculation (min) (ACUTE ONLY): 25 min   Charges:   PT Evaluation $PT Eval Low Complexity: 1 Low PT Treatments $Gait Training: 8-22 mins        Greggory Stallion, PT, DPT [phone]   Nicolet Griffy 12/25/2019, 3:28 PM

## 2019-12-25 NOTE — Discharge Instructions (Signed)
You may shower. Keep your groin clean and dry.

## 2019-12-25 NOTE — Discharge Summary (Addendum)
[facility]    Discharge Summary  Patient ID:  Lindsay [name] MRN: [id number] DOB/AGE: 03-20-1941 79 y.o.  Admit date: 12/23/2019 Discharge date: 12/25/2019 Date of Surgery: 12/24/2019 Surgeon: Surgeon(s): Algernon Huxley, MD  Admission Diagnosis: Ischemic leg [I99.8]  Discharge Diagnoses:  Ischemic leg [I99.8]  Secondary Diagnoses: Past Medical History:  Diagnosis Date  . Anemia   . Anxiety   . Arthritis    osteo, back  . Depression   . Diverticulosis   . GERD (gastroesophageal reflux disease)   . Hematochezia   . Hypertension    controlled on meds  . Hypothyroidism   . PAD (peripheral artery disease) ([place])    Procedure(s): (12/23/19): 1. Ultrasound guidance for vascular access right femoral artery 2. Catheter placement into left posterior tibial artery from right femoral approach 3. Aortogram and selective left lower extremity angiogram 4.  Catheter directed thrombolytic therapy with a total of 6 mg of TPA instilled in the left SFA and popliteal arteries 5.  Mechanical thrombectomy of the left SFA, popliteal artery, and tibioperoneal trunk with penumbra cat 6 device             6.  Angioplasty of the left SFA and popliteal arteries with 5 mm diameter by 30 cm length angioplasty balloon 7.  Placement of infusion catheter for continuous thrombolytic therapy using a 130 cm total length 50 cm working length catheter from the proximal posterior tibial artery up through the tibioperoneal trunk, popliteal artery, and up to the proximal superficial femoral artery             8.   Ultrasound guidance for vascular access right femoral vein             9.   Placement of the venous triple-lumen catheter for venous access in the right femoral vein with ultrasound guidance  (12/24/19): 1.  Left lower extremity angiogram including selective images of the posterior tibial  artery and the peroneal artery 2. Catheter placement into left posterior tibial and peroneal arteries 3.  Percutaneous transluminal angioplasty of the left popliteal artery with 5 mm diameter by 6 cm length Lutonix drug-coated angioplasty balloon 4. Percutaneous transluminal angioplasty of left mid and distal posterior tibial artery with 2.5 mm diameter by 15 cm length angioplasty balloon 5.  Mechanical thrombectomy to the left peroneal artery with penumbra cat 6 device             6.  StarClose closure device right femoral artery  Discharged Condition: Good  HPI / Hospital Course:  Patient is a 79 y.o.female with rest pain of the left foot with acute on chronic ischemia. The patient has noninvasive study showing a occlusion of her left SFA and popliteal stents with a drop in her perfusion from her studies a few months ago. The patient is brought in for angiography for further evaluation and potential treatment.  Due to the limb threatening nature of the situation, angiogram was performed for attempted limb salvage.   On December 22, 2018, the patient underwent:  1. Ultrasound guidance for vascular access right femoral artery 2. Catheter placement into left posterior tibial artery from right femoral approach 3. Aortogram and selective left lower extremity angiogram 4.  Catheter directed thrombolytic therapy with a total of 6 mg of TPA instilled in the left SFA and popliteal arteries 5.  Mechanical thrombectomy of the left SFA, popliteal artery, and tibioperoneal trunk with penumbra cat 6 device  6.  Angioplasty of the left SFA and popliteal arteries with 5 mm diameter by 30 cm length angioplasty balloon 7.  Placement of infusion catheter for continuous thrombolytic therapy using a 130 cm total length 50 cm working length catheter from the proximal posterior  tibial artery up through the tibioperoneal trunk, popliteal artery, and up to the proximal superficial femoral artery             8.   Ultrasound guidance for vascular access right femoral vein             9.   Placement of the venous triple-lumen catheter for venous access in the right femoral vein with ultrasound guidance  She tolerated the procedure well was transferred to the ICU for TPA infusion overnight.  The patients night of procedure was unremarkable.  The patient was then taken back to the to the interventional radiology suite the following afternoon and underwent:  1.  Left lower extremity angiogram including selective images of the posterior tibial artery and the peroneal artery 2. Catheter placement into left posterior tibial and peroneal arteries 3.  Percutaneous transluminal angioplasty of the left popliteal artery with 5 mm diameter by 6 cm length Lutonix drug-coated angioplasty balloon 4. Percutaneous transluminal angioplasty of left mid and distal posterior tibial artery with 2.5 mm diameter by 15 cm length angioplasty balloon 5.  Mechanical thrombectomy to the left peroneal artery with penumbra cat 6 device             6.  StarClose closure device right femoral artery  She tolerated the procedure well was transferred back to the ICU for Aggrastat and heparin infusion overnight.  These infusions were stopped the next morning and she was transitioned to Eliquis.  During the patient's inpatient stay, her diet was advanced, she was urinating independently, her pain was controlled with the use of p.o. pain medication she was ambulating independently.  Upon discharge, the patient was afebrile with stable vital signs.  Physical exam:  A&Ox3, NAD CV: RRR Pulmonary: CTA Bilaterally Abdomen: Soft, nontender, nondistended, (+) bowel sounds Right groin:  Lysis catheter intact clean and dry.  No swelling or drainage. Left  lower extremity:  Thigh soft.  Calf soft.  She remains warm distally to toes.  Minimal edema.  Minimal erythema.  Good capillary refill.  Labs as below  Complications: None  Consults: None  Significant Diagnostic Studies: CBC Lab Results  Component Value Date   WBC 7.7 12/25/2019   HGB 10.5 (L) 12/25/2019   HCT 29.2 (L) 12/25/2019   MCV 85.1 12/25/2019   PLT 146 (L) 12/25/2019   BMET    Component Value Date/Time   NA 143 12/25/2019 0648   NA 145 03/20/2015 0500   K 3.3 (L) 12/25/2019 0648   K 3.9 03/20/2015 0500   CL 108 12/25/2019 0648   CL 117 (H) 03/20/2015 0500   CO2 27 12/25/2019 0648   CO2 27 03/20/2015 0500   GLUCOSE 116 (H) 12/25/2019 0648   GLUCOSE 122 (H) 03/20/2015 0500   BUN 8 12/25/2019 0648   BUN 11 03/20/2015 0500   CREATININE 0.68 12/25/2019 0648   CREATININE 0.63 03/20/2015 0500   CALCIUM 8.3 (L) 12/25/2019 0648   CALCIUM 7.8 (L) 03/20/2015 0500   GFRNONAA >60 12/25/2019 0648   GFRNONAA >60 03/20/2015 0500   GFRAA >60 12/25/2019 0648   GFRAA >60 03/20/2015 0500   COAG Lab Results  Component Value Date   INR 0.97 10/14/2018   INR 0.93  02/09/2017   Disposition:  Discharge to :Home  Allergies as of 12/25/2019      Reactions   Aspirin Other (See Comments)   GI BLEED   Nsaids Other (See Comments)   GI BLEED   Penicillins Itching, Rash, Other (See Comments)   Has patient had a PCN reaction causing immediate rash, facial/tongue/throat swelling, SOB or lightheadedness with hypotension: Yes Has patient had a PCN reaction causing severe rash involving mucus membranes or skin necrosis: No Has patient had a PCN reaction that required hospitalization No Has patient had a PCN reaction occurring within the last 10 years: No If all of the above answers are "NO", then may proceed with Cephalosporin use.   Pollen Extract Other (See Comments)   Congestion, Eye Irritation      Medication List    STOP taking these medications   clopidogrel 75 MG  tablet Commonly known as: PLAVIX     TAKE these medications   acetaminophen 500 MG tablet Commonly known as: TYLENOL Take 1,000 mg by mouth daily.   ALKA-SELTZER PLUS ALLERGY PO Take 1 tablet by mouth daily as needed (allergies).   amLODipine 5 MG tablet Commonly known as: NORVASC Take 10 mg by mouth daily.   apixaban 5 MG Tabs tablet Commonly known as: ELIQUIS Take 1 tablet (5 mg total) by mouth 2 (two) times daily.   atorvastatin 10 MG tablet Commonly known as: Lipitor Take 1 tablet (10 mg total) by mouth daily.   bisoprolol-hydrochlorothiazide 10-6.25 MG tablet Commonly known as: ZIAC TAKE 1 TABLET BY MOUTH DAILY What changed:   how much to take  how to take this  when to take this  additional instructions   levothyroxine 50 MCG tablet Commonly known as: SYNTHROID TAKE ONE (1) TABLET BY MOUTH EVERY DAY What changed:   how much to take  how to take this  when to take this  additional instructions   losartan 100 MG tablet Commonly known as: COZAAR TAKE ONE (1) TABLET BY MOUTH EVERY DAY What changed:   how much to take  how to take this  when to take this  additional instructions   MULTI COMPLETE PO Take 1 tablet by mouth daily.   nicotine 21 mg/24hr patch Commonly known as: NICODERM CQ - dosed in mg/24 hours Place 1 patch (21 mg total) onto the skin daily.   oxyCODONE 5 MG immediate release tablet Commonly known as: Oxy IR/ROXICODONE One to Two Tabs Every Six Hours As Needed For Pain What changed:   how much to take  how to take this  when to take this  reasons to take this  additional instructions   pantoprazole 40 MG tablet Commonly known as: [place] (1) TABLET EACH DAY What changed:   how much to take  how to take this  when to take this  additional instructions            Durable Medical Equipment  (From admission, onward)         Start     Ordered   12/25/19 1410  For home use only DME Walker  rolling  Once    Question Answer Comment  Walker: With [place]   Patient needs a walker to treat with the following condition PAD (peripheral artery disease) ([place])      12/25/19 1409         Verbal and written Discharge instructions given to the patient. Wound care per Discharge AVS Follow-up Information  Algernon Huxley, MD Follow up in 1 month(s).   Specialties: Vascular Surgery, Radiology, Interventional Cardiology Why: Can see [facility] or Arna Medici. Will need ABI with visit.  Contact information: [place] [address] [phone]          Signed: Sela Hua, PA-C  12/25/2019, 2:35 PM

## 2019-12-25 NOTE — Clinical Social Work Note (Signed)
Patient has orders to discharge home today. Eliquis card for 30-days free put in front of patient's chart. RN aware. Tried calling in patient's room to notify but no answer. No further concerns. CSW signing off.  Dayton Scrape, [place]

## 2020-01-07 ENCOUNTER — Encounter (INDEPENDENT_AMBULATORY_CARE_PROVIDER_SITE_OTHER): Payer: Self-pay

## 2020-01-27 ENCOUNTER — Other Ambulatory Visit: Payer: Self-pay

## 2020-01-27 ENCOUNTER — Encounter (INDEPENDENT_AMBULATORY_CARE_PROVIDER_SITE_OTHER): Payer: Self-pay | Admitting: Nurse Practitioner

## 2020-01-27 ENCOUNTER — Ambulatory Visit (INDEPENDENT_AMBULATORY_CARE_PROVIDER_SITE_OTHER): Payer: Medicare HMO | Admitting: Nurse Practitioner

## 2020-01-27 ENCOUNTER — Ambulatory Visit (INDEPENDENT_AMBULATORY_CARE_PROVIDER_SITE_OTHER): Payer: Medicare HMO

## 2020-01-27 VITALS — BP 116/71 | HR 64 | Resp 16 | Wt 165.6 lb

## 2020-01-27 DIAGNOSIS — I70222 Atherosclerosis of native arteries of extremities with rest pain, left leg: Secondary | ICD-10-CM

## 2020-01-27 DIAGNOSIS — G629 Polyneuropathy, unspecified: Secondary | ICD-10-CM | POA: Diagnosis not present

## 2020-01-27 DIAGNOSIS — I739 Peripheral vascular disease, unspecified: Secondary | ICD-10-CM

## 2020-01-27 DIAGNOSIS — F17200 Nicotine dependence, unspecified, uncomplicated: Secondary | ICD-10-CM

## 2020-02-01 ENCOUNTER — Encounter (INDEPENDENT_AMBULATORY_CARE_PROVIDER_SITE_OTHER): Payer: Self-pay | Admitting: Nurse Practitioner

## 2020-02-01 NOTE — Progress Notes (Signed)
SUBJECTIVE:  Patient ID: Lindsay [name], female    DOB: 19-Mar-1941, 79 y.o.   MRN: [id number] Chief Complaint  Patient presents with  . Follow-up    [facility] 7month post angio    HPI  Lindsay [name] is a 79 y.o. female The patient returns to the office for followup and review status post angiogram with intervention.  The patient was initially seen in the emergency room on 12/20/2019 where a CT angiogram was performed and it was found that she had an occlusion of her left SFA.  On 12/23/2019 the patient underwent a thrombectomy of her left SFA, popliteal and tibioperoneal trunk.  On the 21st she had angioplasty of the left popliteal as well as the mid and distal posterior tibial artery.  She also underwent thrombectomy of the left peroneal.  The patient was subsequently found to have COVID-19 at that time as well.  The patient notes improvement in the lower extremity symptoms. No interval shortening of the patient's claudication distance or rest pain symptoms. Previous wounds have now healed.  No new ulcers or wounds have occurred since the last visit.  The patient has since stopped smoking.  The patient does report having some numbness and tingling in her left lower extremity.  She denies any pain in her right lower extremity including claudication-like symptoms.  There have been no significant changes to the patient's overall health care.  The patient denies amaurosis fugax or recent TIA symptoms. There are no recent neurological changes noted. The patient denies history of DVT, PE or superficial thrombophlebitis. The patient denies recent episodes of angina or shortness of breath.   ABI's Rt=0.52 and Lt=0.93  (previous ABI's Rt=0.64 and Lt=1.07) Duplex US of the left lower extremity shows biphasic tibial artery waveforms with monophasic waveforms on the right lower extremity with dampened right toe digit waveforms.  Past Medical History:  Diagnosis Date  . Anemia   . Anxiety   .  Arthritis    osteo, back  . Depression   . Diverticulosis   . GERD (gastroesophageal reflux disease)   . Hematochezia   . Hypertension    controlled on meds  . Hypothyroidism   . PAD (peripheral artery disease) ([place])     Past Surgical History:  Procedure Laterality Date  . ABDOMINAL HYSTERECTOMY    . CARDIAC CATHETERIZATION  1995   normal, Dr Clayborn Bigness  . COLONOSCOPY  12/29/08  . COLONOSCOPY N/A 04/18/2015   Procedure: COLONOSCOPY;  Surgeon: Lucilla Lame, MD;  Location: [facility];  Service: Gastroenterology;  Laterality: N/A;  cecum time- 0957  . FOOT SURGERY Right   . KNEE ARTHROSCOPY Right 10/05/2015   Procedure: ARTHROSCOPY KNEE, partial lateral menisectomy;  Surgeon: Earnestine Leys, MD;  Location: [facility];  Service: Orthopedics;  Laterality: Right;  . LOWER EXTREMITY ANGIOGRAPHY Left 02/27/2018   Procedure: LOWER EXTREMITY ANGIOGRAPHY;  Surgeon: Algernon Huxley, MD;  Location: [facility];  Service: Cardiovascular;  Laterality: Left;  . LOWER EXTREMITY ANGIOGRAPHY Left 10/15/2018   Procedure: LOWER EXTREMITY ANGIOGRAPHY;  Surgeon: Algernon Huxley, MD;  Location: [facility];  Service: Cardiovascular;  Laterality: Left;  . LOWER EXTREMITY ANGIOGRAPHY Left 12/29/2018   Procedure: LOWER EXTREMITY ANGIOGRAPHY;  Surgeon: Algernon Huxley, MD;  Location: [facility];  Service: Cardiovascular;  Laterality: Left;  . LOWER EXTREMITY ANGIOGRAPHY Left 12/23/2019   Procedure: LOWER EXTREMITY ANGIOGRAPHY;  Surgeon: Algernon Huxley, MD;  Location: [facility];  Service: Cardiovascular;  Laterality: Left;  . LOWER EXTREMITY ANGIOGRAPHY Left 12/24/2019   Procedure: Lower Extremity Angiography;  Surgeon: Algernon Huxley, MD;  Location: [facility];  Service: Cardiovascular;  Laterality: Left;  . POLYPECTOMY  04/18/2015   Procedure: POLYPECTOMY INTESTINAL;  Surgeon: Lucilla Lame, MD;  Location: [facility];  Service: Gastroenterology;;  . WRIST SURGERY Right       Social History   Socioeconomic History  . Marital status: Widowed    Spouse name: Not on file  . Number of children: Not on file  . Years of education: Not on file  . Highest education level: Not on file  Occupational History  . Not on file  Tobacco Use  . Smoking status: Former Smoker    Packs/day: 0.50    Years: 30.00    Pack years: 15.00    Types: Cigarettes    Quit date: 10/14/2018    Years since quitting: 1.3  . Smokeless tobacco: Never Used  Substance and Sexual Activity  . Alcohol use: Yes    Alcohol/week: 0.0 standard drinks    Comment: occassionally/ whiskey on weekend; total of 4 drinks per week  . Drug use: No  . Sexual activity: Not Currently  Other Topics Concern  . Not on file  Social History Narrative   From [place].   [place]- 20 hours week.    Husband in nursing home in [place].    1 child; 3 step children   Lives by herself, independent         Social Determinants of Health   Financial Resource Strain:   . Difficulty of Paying Living Expenses: Not on file  Food Insecurity:   . Worried About Charity fundraiser in the Last Year: Not on file  . Ran Out of Food in the Last Year: Not on file  Transportation Needs:   . Lack of Transportation (Medical): Not on file  . Lack of Transportation (Non-Medical): Not on file  Physical Activity:   . Days of Exercise per Week: Not on file  . Minutes of Exercise per Session: Not on file  Stress:   . Feeling of Stress : Not on file  Social Connections:   . Frequency of Communication with Friends and Family: Not on file  . Frequency of Social Gatherings with Friends and Family: Not on file  . Attends Religious Services: Not on file  . Active Member of Clubs or Organizations: Not on file  . Attends Archivist Meetings: Not on file  . Marital Status: Not on file  Intimate Partner Violence:   . Fear of Current or Ex-Partner: Not on file  . Emotionally Abused: Not on  file  . Physically Abused: Not on file  . Sexually Abused: Not on file    Family History  Problem Relation Age of Onset  . Diabetes Mother   . Hypertension Mother   . Diabetes Sister   . Hypertension Sister     Allergies  Allergen Reactions  . Aspirin Other (See Comments)    GI BLEED  . Nsaids Other (See Comments)    GI BLEED  . Penicillins Itching, Rash and Other (See Comments)    Has patient had a PCN reaction causing immediate rash, facial/tongue/throat swelling, SOB or lightheadedness with hypotension: Yes Has patient had a PCN reaction causing severe rash involving mucus membranes or skin necrosis: No Has patient had a PCN reaction that required hospitalization No Has patient had a PCN reaction  occurring within the last 10 years: No If all of the above answers are "NO", then may proceed with Cephalosporin use.   . Pollen Extract Other (See Comments)    Congestion, Eye Irritation     Review of Systems   Review of Systems: Negative Unless Checked Constitutional: [] Weight loss  [] Fever  [] Chills Cardiac: [] Chest pain   []  Atrial Fibrillation  [] Palpitations   [] Shortness of breath when laying flat   [] Shortness of breath with exertion. [] Shortness of breath at rest Vascular:  [] Pain in legs with walking   [] Pain in legs with standing [] Pain in legs when laying flat   [x] Claudication    [] Pain in feet when laying flat    [] History of DVT   [] Phlebitis   [] Swelling in legs   [] Varicose veins   [] Non-healing ulcers Pulmonary:   [] Uses home oxygen   [] Productive cough   [] Hemoptysis   [] Wheeze  [] COPD   [] Asthma Neurologic:  [] Dizziness   [] Seizures  [] Blackouts [] History of stroke   [] History of TIA  [] Aphasia   [] Temporary Blindness   [] Weakness or numbness in arm   [x] Weakness or numbness in leg Musculoskeletal:   [] Joint swelling   [] Joint pain   [] Low back pain  []  History of Knee Replacement [x] Arthritis [] back Surgeries  []  Spinal Stenosis    Hematologic:  [] Easy bruising   [] Easy bleeding   [] Hypercoagulable state   [] Anemic Gastrointestinal:  [] Diarrhea   [] Vomiting  [] Gastroesophageal reflux/heartburn   [] Difficulty swallowing. [] Abdominal pain Genitourinary:  [] Chronic kidney disease   [] Difficult urination  [] Anuric   [] Blood in urine [] Frequent urination  [] Burning with urination   [] Hematuria Skin:  [] Rashes   [] Ulcers [] Wounds Psychological:  [] History of anxiety   [x]  History of major depression  []  Memory Difficulties      OBJECTIVE:   Physical Exam  BP 116/71 (BP Location: Left Arm)   Pulse 64   Resp 16   Wt 165 lb 9.6 oz (75.1 kg)   BMI 27.56 kg/m   Gen: WD/WN, NAD Head: [place]/AT, No temporalis wasting.  Ear/Nose/Throat: Hearing grossly intact, nares w/o erythema or drainage Eyes: PER, EOMI, sclera nonicteric.  Neck: Supple, no masses.  No JVD.  Pulmonary:  Good air movement, no use of accessory muscles.  Cardiac: RRR Vascular:  Vessel Right Left  Radial Palpable Palpable  Dorsalis Pedis  not palpable Palpable  Posterior Tibial  not palpable Palpable   Gastrointestinal: soft, non-distended. No guarding/no peritoneal signs.  Musculoskeletal: M/S 5/5 throughout.  No deformity or atrophy.  Neurologic: Pain and light touch intact in extremities.  Symmetrical.  Speech is fluent. Motor exam as listed above. Psychiatric: Judgment intact, Mood & affect appropriate for pt's clinical situation. Dermatologic: No Venous rashes. No Ulcers Noted.  No changes consistent with cellulitis. Lymph : No Cervical lymphadenopathy, no lichenification or skin changes of chronic lymphedema.       ASSESSMENT AND PLAN:  1. PAD (peripheral artery disease) (HCC)  Recommend:  The patient has evidence of atherosclerosis of the lower extremities with claudication.  The patient does not voice lifestyle limiting changes at this point in time.  Discussed angiogram and intervention of the right lower extremity due to studies today however at this time the patient  does not wish to proceed as she is not having significant symptoms.  She also does not have any limb threatening symptoms at this time.  Noninvasive studies do not suggest clinically significant change.  No invasive studies, angiography or surgery at this  time The patient should continue walking and begin a more formal exercise program.  The patient should continue antiplatelet therapy and aggressive treatment of the lipid abnormalities  No changes in the patient's medications at this time  The patient should continue wearing graduated compression socks 10-15 mmHg strength to control the mild edema.    2. Neuropathy This is likely a consequence of extended ischemia in her left lower extremity.  We discussed medical intervention such as trying Neurontin however at this time the patient does not wish to try any additional medications.  Patient advised that if she changes her mind she can contact her office otherwise it may just take time for the nursing station to return.  3. Tobacco use disorder Patient has recently quit.  Abstinence is encouraged.   Current Outpatient Medications on File Prior to Visit  Medication Sig Dispense Refill  . acetaminophen (TYLENOL) 500 MG tablet Take 1,000 mg by mouth daily.    Marland Kitchen amLODipine (NORVASC) 5 MG tablet Take 10 mg by mouth daily.     Marland Kitchen apixaban (ELIQUIS) 5 MG TABS tablet Take 1 tablet (5 mg total) by mouth 2 (two) times daily. 60 tablet 5  . bisoprolol-hydrochlorothiazide (ZIAC) 10-6.25 MG tablet TAKE 1 TABLET BY MOUTH DAILY (Patient taking differently: Take 1 tablet by mouth daily. ) 30 tablet 0  . levothyroxine (SYNTHROID, LEVOTHROID) 50 MCG tablet TAKE ONE (1) TABLET BY MOUTH EVERY DAY (Patient taking differently: Take 50 mcg by mouth daily. ) 90 tablet 0  . losartan (COZAAR) 100 MG tablet TAKE ONE (1) TABLET BY MOUTH EVERY DAY (Patient taking differently: Take 100 mg by mouth daily. ) 90 tablet 3  . Multiple Vitamins-Minerals (MULTI COMPLETE PO)  Take 1 tablet by mouth daily.     . nicotine (NICODERM CQ - DOSED IN MG/24 HOURS) 21 mg/24hr patch Place 1 patch (21 mg total) onto the skin daily. 28 patch 0  . oxyCODONE (OXY IR/ROXICODONE) 5 MG immediate release tablet One to Two Tabs Every Six Hours As Needed For Pain 50 tablet 0  . pantoprazole (PROTONIX) 40 MG tablet TAKE ONE (1) TABLET EACH DAY (Patient taking differently: Take 40 mg by mouth daily. ) 90 tablet 3  . atorvastatin (LIPITOR) 10 MG tablet Take 1 tablet (10 mg total) by mouth daily. 90 tablet 3  . DiphenhydrAMINE HCl (ALKA-SELTZER PLUS ALLERGY PO) Take 1 tablet by mouth daily as needed (allergies).     No current facility-administered medications on file prior to visit.    There are no Patient Instructions on file for this visit. No follow-ups on file.   Kris Hartmann, NP  This note was completed with Sales executive.  Any errors are purely unintentional.

## 2020-03-24 ENCOUNTER — Encounter (INDEPENDENT_AMBULATORY_CARE_PROVIDER_SITE_OTHER): Payer: Self-pay

## 2020-04-18 ENCOUNTER — Other Ambulatory Visit (INDEPENDENT_AMBULATORY_CARE_PROVIDER_SITE_OTHER): Payer: Self-pay | Admitting: Nurse Practitioner

## 2020-04-18 DIAGNOSIS — I739 Peripheral vascular disease, unspecified: Secondary | ICD-10-CM

## 2020-04-19 ENCOUNTER — Ambulatory Visit (INDEPENDENT_AMBULATORY_CARE_PROVIDER_SITE_OTHER): Payer: Medicare HMO | Admitting: Nurse Practitioner

## 2020-04-19 ENCOUNTER — Encounter (INDEPENDENT_AMBULATORY_CARE_PROVIDER_SITE_OTHER): Payer: Self-pay | Admitting: Nurse Practitioner

## 2020-04-19 ENCOUNTER — Other Ambulatory Visit: Payer: Self-pay

## 2020-04-19 ENCOUNTER — Ambulatory Visit (INDEPENDENT_AMBULATORY_CARE_PROVIDER_SITE_OTHER): Payer: Medicare HMO

## 2020-04-19 VITALS — BP 136/78 | HR 76 | Ht 65.0 in | Wt 170.0 lb

## 2020-04-19 DIAGNOSIS — E785 Hyperlipidemia, unspecified: Secondary | ICD-10-CM | POA: Diagnosis not present

## 2020-04-19 DIAGNOSIS — G629 Polyneuropathy, unspecified: Secondary | ICD-10-CM

## 2020-04-19 DIAGNOSIS — I10 Essential (primary) hypertension: Secondary | ICD-10-CM | POA: Diagnosis not present

## 2020-04-19 DIAGNOSIS — I739 Peripheral vascular disease, unspecified: Secondary | ICD-10-CM | POA: Diagnosis not present

## 2020-04-19 MED ORDER — GABAPENTIN 300 MG PO CAPS: 300.0000 mg | ORAL_CAPSULE | Freq: Two times a day (BID) | ORAL | 1 refills | Status: DC

## 2020-04-19 NOTE — Progress Notes (Signed)
Subjective:    Patient ID: Lindsay [name], female    DOB: 04-Jan-1941, 79 y.o.   MRN: [id number] Chief Complaint  Patient presents with  . Follow-up    U/S Follow up    The patient returns today for noninvasive studies related to her peripheral arterial disease.  Most recently the patient underwent intervention on 12/24/2019.  This is due to ischemia of the left lower extremity.  The patient had severe ischemia for several days prior to intervention.  Following the intervention the patient continued to have neuropathic pain she states that today it is much worse.  The patient states that he feels as if she is walking on rocks.  She also has pain throughout the evening that she describes as a sharp shooting stabbing pain with her toes.  She is unsure of whether dangling her toes off the bed makes it any better.  She denies significant claudication at this time.  She denies any discoloration or cold feeling of the extremities.  Overall it is just the numb tingling feeling that is giving her significant distress.  Today noninvasive studies show a right ABI 0.55 and a left ABI 0.91.  Her previous ABI on 01/27/2020 is 0.52 with a left of 0.93.  The patient has monophasic waveforms in her bilateral tibial artery however they are both strong.  The left lower extremity has dampened toe waves in addition to the right toe waveforms.  The left has a toe digit pressure of 51 whereas the right has a pressure of 43.   Review of Systems  Musculoskeletal: Positive for myalgias.  Neurological: Positive for numbness.  All other systems reviewed and are negative.      Objective:   Physical Exam Vitals reviewed.  Constitutional:      Appearance: Normal appearance.  Cardiovascular:     Pulses:          Dorsalis pedis pulses are 1+ on the right side and 1+ on the left side.       Posterior tibial pulses are 0 on the right side and 1+ on the left side.  Skin:    General: Skin is dry.  Neurological:   Mental Status: She is alert and oriented to person, place, and time.  Psychiatric:        Mood and Affect: Mood normal.        Behavior: Behavior normal.        Thought Content: Thought content normal.        Judgment: Judgment normal.     BP 136/78   Pulse 76   Ht 5\' 5"  (1.651 m)   Wt 170 lb (77.1 kg)   BMI 28.29 kg/m   Past Medical History:  Diagnosis Date  . Anemia   . Anxiety   . Arthritis    osteo, back  . Depression   . Diverticulosis   . GERD (gastroesophageal reflux disease)   . Hematochezia   . Hypertension    controlled on meds  . Hypothyroidism   . PAD (peripheral artery disease) ([facility])     Social History   Socioeconomic History  . Marital status: Widowed    Spouse name: Not on file  . Number of children: Not on file  . Years of education: Not on file  . Highest education level: Not on file  Occupational History  . Not on file  Tobacco Use  . Smoking status: Former Smoker    Packs/day: 0.50    Years: 30.00  Pack years: 15.00    Types: Cigarettes    Quit date: 10/14/2018    Years since quitting: 1.5  . Smokeless tobacco: Never Used  Substance and Sexual Activity  . Alcohol use: Yes    Alcohol/week: 0.0 standard drinks    Comment: occassionally/ whiskey on weekend; total of 4 drinks per week  . Drug use: No  . Sexual activity: Not Currently  Other Topics Concern  . Not on file  Social History Narrative   From [place].   [place]- 20 hours week.    Husband in nursing home in [place].    1 child; 3 step children   Lives by herself, independent         Social Determinants of Health   Financial Resource Strain:   . Difficulty of Paying Living Expenses:   Food Insecurity:   . Worried About Charity fundraiser in the Last Year:   . Arboriculturist in the Last Year:   Transportation Needs:   . Film/video editor (Medical):   Marland Kitchen Lack of Transportation (Non-Medical):   Physical Activity:   . Days of  Exercise per Week:   . Minutes of Exercise per Session:   Stress:   . Feeling of Stress :   Social Connections:   . Frequency of Communication with Friends and Family:   . Frequency of Social Gatherings with Friends and Family:   . Attends Religious Services:   . Active Member of Clubs or Organizations:   . Attends Archivist Meetings:   Marland Kitchen Marital Status:   Intimate Partner Violence:   . Fear of Current or Ex-Partner:   . Emotionally Abused:   Marland Kitchen Physically Abused:   . Sexually Abused:     Past Surgical History:  Procedure Laterality Date  . ABDOMINAL HYSTERECTOMY    . CARDIAC CATHETERIZATION  1995   normal, Dr Clayborn Bigness  . COLONOSCOPY  12/29/08  . COLONOSCOPY N/A 04/18/2015   Procedure: COLONOSCOPY;  Surgeon: Lucilla Lame, MD;  Location: [facility];  Service: Gastroenterology;  Laterality: N/A;  cecum time- 0957  . FOOT SURGERY Right   . KNEE ARTHROSCOPY Right 10/05/2015   Procedure: ARTHROSCOPY KNEE, partial lateral menisectomy;  Surgeon: Earnestine Leys, MD;  Location: [facility];  Service: Orthopedics;  Laterality: Right;  . LOWER EXTREMITY ANGIOGRAPHY Left 02/27/2018   Procedure: LOWER EXTREMITY ANGIOGRAPHY;  Surgeon: Algernon Huxley, MD;  Location: [facility];  Service: Cardiovascular;  Laterality: Left;  . LOWER EXTREMITY ANGIOGRAPHY Left 10/15/2018   Procedure: LOWER EXTREMITY ANGIOGRAPHY;  Surgeon: Algernon Huxley, MD;  Location: [facility];  Service: Cardiovascular;  Laterality: Left;  . LOWER EXTREMITY ANGIOGRAPHY Left 12/29/2018   Procedure: LOWER EXTREMITY ANGIOGRAPHY;  Surgeon: Algernon Huxley, MD;  Location: [facility];  Service: Cardiovascular;  Laterality: Left;  . LOWER EXTREMITY ANGIOGRAPHY Left 12/23/2019   Procedure: LOWER EXTREMITY ANGIOGRAPHY;  Surgeon: Algernon Huxley, MD;  Location: [facility];  Service: Cardiovascular;  Laterality: Left;  . LOWER EXTREMITY ANGIOGRAPHY Left 12/24/2019   Procedure: Lower Extremity  Angiography;  Surgeon: Algernon Huxley, MD;  Location: [facility];  Service: Cardiovascular;  Laterality: Left;  . POLYPECTOMY  04/18/2015   Procedure: POLYPECTOMY INTESTINAL;  Surgeon: Lucilla Lame, MD;  Location: [facility];  Service: Gastroenterology;;  . WRIST SURGERY Right     Family History  Problem Relation Age of Onset  . Diabetes Mother   .  Hypertension Mother   . Diabetes Sister   . Hypertension Sister     Allergies  Allergen Reactions  . Aspirin Other (See Comments)    GI BLEED  . Nsaids Other (See Comments)    GI BLEED  . Penicillins Itching, Rash and Other (See Comments)    Has patient had a PCN reaction causing immediate rash, facial/tongue/throat swelling, SOB or lightheadedness with hypotension: Yes Has patient had a PCN reaction causing severe rash involving mucus membranes or skin necrosis: No Has patient had a PCN reaction that required hospitalization No Has patient had a PCN reaction occurring within the last 10 years: No If all of the above answers are "NO", then may proceed with Cephalosporin use.   . Pollen Extract Other (See Comments)    Congestion, Eye Irritation       Assessment & Plan:   1. PAD (peripheral artery disease) ([place]) Today patient's noninvasive studies are consistent with her previous studies.  The patient still planes of significant discomfort in her left lower extremity.  The patient is advised to continue with her anticoagulation.  The patient is advised to continue to exercise as possible.  Based on her description of pain it is more likely related to neuropathy however when she returns in 6 weeks we will also have a left lower extremity arterial duplex to make sure that there is no area of narrowing that could be causing the symptoms as well.  If there is no significant area of stenosis will return to normal follow-up visits. - VAS [place] ABI WITH/WO TBI  2. Hyperlipidemia, unspecified hyperlipidemia type Continue statin as  ordered and reviewed, no changes at this time   3. Neuropathy Based upon the patient's description of pain and discomfort we will start Neurontin.  The patient is likely dealing with neuropathy as she did have a significant ischemic event in January.  And her pain has been ongoing since that time.  I discussed with the patient that Neurontin is in her medicine is not a pain medication and it can take time to work and in the worst case scenarios we may need a different medication.  In some cases we may need to involve neurology for management.  We will have the patient try Neurontin twice a day for 6 weeks.  We will also have her return with noninvasive studies to ensure that there is not an arterial component to her discomfort. - gabapentin (NEURONTIN) 300 MG capsule; Take 1 capsule (300 mg total) by mouth 2 (two) times daily.  Dispense: 60 capsule; Refill: 1  4. Essential hypertension Continue antihypertensive medications as already ordered, these medications have been reviewed and there are no changes at this time.     Current Outpatient Medications on File Prior to Visit  Medication Sig Dispense Refill  . acetaminophen (TYLENOL) 500 MG tablet Take 1,000 mg by mouth daily.    Marland Kitchen amLODipine (NORVASC) 5 MG tablet Take 10 mg by mouth daily.     Marland Kitchen apixaban (ELIQUIS) 5 MG TABS tablet Take 1 tablet (5 mg total) by mouth 2 (two) times daily. 60 tablet 5  . bisoprolol-hydrochlorothiazide (ZIAC) 10-6.25 MG tablet TAKE 1 TABLET BY MOUTH DAILY (Patient taking differently: Take 1 tablet by mouth daily. ) 30 tablet 0  . levothyroxine (SYNTHROID, LEVOTHROID) 50 MCG tablet TAKE ONE (1) TABLET BY MOUTH EVERY DAY (Patient taking differently: Take 50 mcg by mouth daily. ) 90 tablet 0  . losartan (COZAAR) 100 MG tablet TAKE ONE (1) TABLET  BY MOUTH EVERY DAY (Patient taking differently: Take 100 mg by mouth daily. ) 90 tablet 3  . Multiple Vitamins-Minerals (MULTI COMPLETE PO) Take 1 tablet by mouth daily.     .  pantoprazole (PROTONIX) 40 MG tablet TAKE ONE (1) TABLET EACH DAY (Patient taking differently: Take 40 mg by mouth daily. ) 90 tablet 3  . atorvastatin (LIPITOR) 10 MG tablet Take 1 tablet (10 mg total) by mouth daily. 90 tablet 3  . DiphenhydrAMINE HCl (ALKA-SELTZER PLUS ALLERGY PO) Take 1 tablet by mouth daily as needed (allergies).    . nicotine (NICODERM CQ - DOSED IN MG/24 HOURS) 21 mg/24hr patch Place 1 patch (21 mg total) onto the skin daily. (Patient not taking: Reported on 04/19/2020) 28 patch 0  . oxyCODONE (OXY IR/ROXICODONE) 5 MG immediate release tablet One to Two Tabs Every Six Hours As Needed For Pain (Patient not taking: Reported on 04/19/2020) 50 tablet 0   No current facility-administered medications on file prior to visit.    There are no Patient Instructions on file for this visit. No follow-ups on file.   Kris Hartmann, NP

## 2020-04-22 ENCOUNTER — Encounter (INDEPENDENT_AMBULATORY_CARE_PROVIDER_SITE_OTHER): Payer: Medicare HMO

## 2020-04-22 ENCOUNTER — Ambulatory Visit (INDEPENDENT_AMBULATORY_CARE_PROVIDER_SITE_OTHER): Payer: Medicare HMO | Admitting: Nurse Practitioner

## 2020-04-25 ENCOUNTER — Encounter (INDEPENDENT_AMBULATORY_CARE_PROVIDER_SITE_OTHER): Payer: Medicare HMO

## 2020-05-24 ENCOUNTER — Other Ambulatory Visit (INDEPENDENT_AMBULATORY_CARE_PROVIDER_SITE_OTHER): Payer: Self-pay | Admitting: Nurse Practitioner

## 2020-05-24 DIAGNOSIS — M79605 Pain in left leg: Secondary | ICD-10-CM

## 2020-05-24 DIAGNOSIS — I739 Peripheral vascular disease, unspecified: Secondary | ICD-10-CM

## 2020-05-25 ENCOUNTER — Encounter (INDEPENDENT_AMBULATORY_CARE_PROVIDER_SITE_OTHER): Payer: Self-pay | Admitting: Nurse Practitioner

## 2020-05-25 ENCOUNTER — Other Ambulatory Visit: Payer: Self-pay

## 2020-05-25 ENCOUNTER — Ambulatory Visit (INDEPENDENT_AMBULATORY_CARE_PROVIDER_SITE_OTHER): Payer: Medicare HMO

## 2020-05-25 ENCOUNTER — Ambulatory Visit (INDEPENDENT_AMBULATORY_CARE_PROVIDER_SITE_OTHER): Payer: Medicare HMO | Admitting: Nurse Practitioner

## 2020-05-25 VITALS — BP 144/78 | HR 70 | Resp 16 | Wt 172.0 lb

## 2020-05-25 DIAGNOSIS — R200 Anesthesia of skin: Secondary | ICD-10-CM | POA: Diagnosis not present

## 2020-05-25 DIAGNOSIS — I739 Peripheral vascular disease, unspecified: Secondary | ICD-10-CM | POA: Diagnosis not present

## 2020-05-25 DIAGNOSIS — M79605 Pain in left leg: Secondary | ICD-10-CM | POA: Diagnosis not present

## 2020-05-25 DIAGNOSIS — F17200 Nicotine dependence, unspecified, uncomplicated: Secondary | ICD-10-CM | POA: Diagnosis not present

## 2020-05-25 DIAGNOSIS — I10 Essential (primary) hypertension: Secondary | ICD-10-CM | POA: Diagnosis not present

## 2020-05-30 ENCOUNTER — Encounter (INDEPENDENT_AMBULATORY_CARE_PROVIDER_SITE_OTHER): Payer: Self-pay | Admitting: Nurse Practitioner

## 2020-05-30 NOTE — Progress Notes (Signed)
Subjective:    Patient ID: Lindsay [name], female    DOB: [birth year], 79 y.o.   MRN: [id number] Chief Complaint  Patient presents with  . Follow-up    ultrasound follow up    The patient returns today for noninvasive studies related to her peripheral arterial disease.  Most recently the patient underwent intervention on 12/24/2019.  This is due to ischemia of the left lower extremity.  The patient had severe ischemia for several days prior to intervention.  Following the intervention the patient had severe neuropathic pain, but at the most recent office visit she was started on Neurontin.  The patient has only been taking it once per day before bedtime but it has been helpful.  She denies any significant claudication.  She denies any discoloration or cold feeling of the extremities.  The tingling sensation is uncomfortable however not significantly limiting at this time.  Today noninvasive studies reveal triphasic/biphasic waveforms throughout the left lower extremity with a patent SFA/popliteal stent status post thrombectomy with no evidence of restenosis.    Review of Systems  Neurological: Positive for numbness.  All other systems reviewed and are negative.      Objective:   Physical Exam Vitals reviewed.  HENT:     Head: Normocephalic.  Cardiovascular:     Rate and Rhythm: Normal rate and regular rhythm.     Pulses: Normal pulses.     Heart sounds: Normal heart sounds.  Neurological:     General: No focal deficit present.     Mental Status: She is alert and oriented to person, place, and time.  Psychiatric:        Mood and Affect: Mood normal.        Behavior: Behavior normal.        Thought Content: Thought content normal.        Judgment: Judgment normal.     BP (!) 144/78 (BP Location: Left Arm)   Pulse 70   Resp 16   Wt 172 lb (78 kg)   BMI 28.62 kg/m   Past Medical History:  Diagnosis Date  . Anemia   . Anxiety   . Arthritis    osteo, back  .  Depression   . Diverticulosis   . GERD (gastroesophageal reflux disease)   . Hematochezia   . Hypertension    controlled on meds  . Hypothyroidism   . PAD (peripheral artery disease) (HCC)     Social History   Socioeconomic History  . Marital status: Widowed    Spouse name: Not on file  . Number of children: Not on file  . Years of education: Not on file  . Highest education level: Not on file  Occupational History  . Not on file  Tobacco Use  . Smoking status: Former Smoker    Packs/day: 0.50    Years: 30.00    Pack years: 15.00    Types: Cigarettes    Quit date: 10/14/2018    Years since quitting: 1.6  . Smokeless tobacco: Never Used  Vaping Use  . Vaping Use: Never used  Substance and Sexual Activity  . Alcohol use: Yes    Alcohol/week: 0.0 standard drinks    Comment: occassionally/ whiskey on weekend; total of 4 drinks per week  . Drug use: No  . Sexual activity: Not Currently  Other Topics Concern  . Not on file  Social History Narrative   From [place].   [place]- 20 hours week.  Husband in nursing home in [place].    1 child; 3 step children   Lives by herself, independent         Social Determinants of Health   Financial Resource Strain:   . Difficulty of Paying Living Expenses:   Food Insecurity:   . Worried About Charity fundraiser in the Last Year:   . Arboriculturist in the Last Year:   Transportation Needs:   . Film/video editor (Medical):   Marland Kitchen Lack of Transportation (Non-Medical):   Physical Activity:   . Days of Exercise per Week:   . Minutes of Exercise per Session:   Stress:   . Feeling of Stress :   Social Connections:   . Frequency of Communication with Friends and Family:   . Frequency of Social Gatherings with Friends and Family:   . Attends Religious Services:   . Active Member of Clubs or Organizations:   . Attends Archivist Meetings:   Marland Kitchen Marital Status:   Intimate Partner  Violence:   . Fear of Current or Ex-Partner:   . Emotionally Abused:   Marland Kitchen Physically Abused:   . Sexually Abused:     Past Surgical History:  Procedure Laterality Date  . ABDOMINAL HYSTERECTOMY    . CARDIAC CATHETERIZATION  1995   normal, Dr Clayborn Bigness  . COLONOSCOPY  12/29/08  . COLONOSCOPY N/A 04/18/2015   Procedure: COLONOSCOPY;  Surgeon: Lucilla Lame, MD;  Location: [facility];  Service: Gastroenterology;  Laterality: N/A;  cecum time- 0957  . FOOT SURGERY Right   . KNEE ARTHROSCOPY Right 10/05/2015   Procedure: ARTHROSCOPY KNEE, partial lateral menisectomy;  Surgeon: Earnestine Leys, MD;  Location: [facility];  Service: Orthopedics;  Laterality: Right;  . LOWER EXTREMITY ANGIOGRAPHY Left 02/27/2018   Procedure: LOWER EXTREMITY ANGIOGRAPHY;  Surgeon: Algernon Huxley, MD;  Location: [facility];  Service: Cardiovascular;  Laterality: Left;  . LOWER EXTREMITY ANGIOGRAPHY Left 10/15/2018   Procedure: LOWER EXTREMITY ANGIOGRAPHY;  Surgeon: Algernon Huxley, MD;  Location: [facility];  Service: Cardiovascular;  Laterality: Left;  . LOWER EXTREMITY ANGIOGRAPHY Left 12/29/2018   Procedure: LOWER EXTREMITY ANGIOGRAPHY;  Surgeon: Algernon Huxley, MD;  Location: [facility];  Service: Cardiovascular;  Laterality: Left;  . LOWER EXTREMITY ANGIOGRAPHY Left 12/23/2019   Procedure: LOWER EXTREMITY ANGIOGRAPHY;  Surgeon: Algernon Huxley, MD;  Location: [facility];  Service: Cardiovascular;  Laterality: Left;  . LOWER EXTREMITY ANGIOGRAPHY Left 12/24/2019   Procedure: Lower Extremity Angiography;  Surgeon: Algernon Huxley, MD;  Location: [facility];  Service: Cardiovascular;  Laterality: Left;  . POLYPECTOMY  04/18/2015   Procedure: POLYPECTOMY INTESTINAL;  Surgeon: Lucilla Lame, MD;  Location: [facility];  Service: Gastroenterology;;  . WRIST SURGERY Right     Family History  Problem Relation Age of Onset  . Diabetes Mother   . Hypertension Mother   .  Diabetes Sister   . Hypertension Sister     Allergies  Allergen Reactions  . Aspirin Other (See Comments)    GI BLEED  . Nsaids Other (See Comments)    GI BLEED  . Penicillins Itching, Rash and Other (See Comments)    Has patient had a PCN reaction causing immediate rash, facial/tongue/throat swelling, SOB or lightheadedness with hypotension: Yes Has patient had a PCN reaction causing severe rash involving mucus membranes or skin necrosis: No Has patient had a PCN reaction that required hospitalization No  Has patient had a PCN reaction occurring within the last 10 years: No If all of the above answers are "NO", then may proceed with Cephalosporin use.   . Pollen Extract Other (See Comments)    Congestion, Eye Irritation       Assessment & Plan:   1. PAD (peripheral artery disease) (HCC)  Recommend:  The patient has evidence of atherosclerosis of the lower extremities with claudication.  The patient does not voice lifestyle limiting changes at this point in time.  Noninvasive studies do not suggest clinically significant change.  No invasive studies, angiography or surgery at this time The patient should continue walking and begin a more formal exercise program.  The patient should continue antiplatelet therapy and aggressive treatment of the lipid abnormalities  No changes in the patient's medications at this time  The patient should continue wearing graduated compression socks 10-15 mmHg strength to control the mild edema.    Patient will follow up in 3 months with noninvasive studies 2. Essential hypertension Continue antihypertensive medications as already ordered, these medications have been reviewed and there are no changes at this time.   3. Tobacco use disorder Continues to abstain, abstinence encouraged  4. Numbness of foot Patient will continue with gabapentin.  Patient is advised to try to utilize 2 per evening to see if that gives her a greater response.   Patient is apprehensive to take during the day due to possibility of fatigue.   Current Outpatient Medications on File Prior to Visit  Medication Sig Dispense Refill  . acetaminophen (TYLENOL) 500 MG tablet Take 1,000 mg by mouth daily.    Marland Kitchen amLODipine (NORVASC) 5 MG tablet Take 10 mg by mouth daily.     Marland Kitchen apixaban (ELIQUIS) 5 MG TABS tablet Take 1 tablet (5 mg total) by mouth 2 (two) times daily. 60 tablet 5  . atorvastatin (LIPITOR) 10 MG tablet Take 1 tablet (10 mg total) by mouth daily. 90 tablet 3  . bisoprolol-hydrochlorothiazide (ZIAC) 10-6.25 MG tablet TAKE 1 TABLET BY MOUTH DAILY (Patient taking differently: Take 1 tablet by mouth daily. ) 30 tablet 0  . DiphenhydrAMINE HCl (ALKA-SELTZER PLUS ALLERGY PO) Take 1 tablet by mouth daily as needed (allergies).    . gabapentin (NEURONTIN) 300 MG capsule Take 1 capsule (300 mg total) by mouth 2 (two) times daily. 60 capsule 1  . levothyroxine (SYNTHROID, LEVOTHROID) 50 MCG tablet TAKE ONE (1) TABLET BY MOUTH EVERY DAY (Patient taking differently: Take 50 mcg by mouth daily. ) 90 tablet 0  . losartan (COZAAR) 100 MG tablet TAKE ONE (1) TABLET BY MOUTH EVERY DAY (Patient taking differently: Take 100 mg by mouth daily. ) 90 tablet 3  . Multiple Vitamins-Minerals (MULTI COMPLETE PO) Take 1 tablet by mouth daily.     . pantoprazole (PROTONIX) 40 MG tablet TAKE ONE (1) TABLET EACH DAY (Patient taking differently: Take 40 mg by mouth daily. ) 90 tablet 3  . nicotine (NICODERM CQ - DOSED IN MG/24 HOURS) 21 mg/24hr patch Place 1 patch (21 mg total) onto the skin daily. (Patient not taking: Reported on 04/19/2020) 28 patch 0  . oxyCODONE (OXY IR/ROXICODONE) 5 MG immediate release tablet One to Two Tabs Every Six Hours As Needed For Pain (Patient not taking: Reported on 04/19/2020) 50 tablet 0   No current facility-administered medications on file prior to visit.    There are no Patient Instructions on file for this visit. No follow-ups on  file.   Kris Hartmann,  NP

## 2020-07-25 ENCOUNTER — Other Ambulatory Visit (INDEPENDENT_AMBULATORY_CARE_PROVIDER_SITE_OTHER): Payer: Self-pay | Admitting: Vascular Surgery

## 2020-08-15 ENCOUNTER — Other Ambulatory Visit (INDEPENDENT_AMBULATORY_CARE_PROVIDER_SITE_OTHER): Payer: Self-pay | Admitting: Nurse Practitioner

## 2020-08-15 DIAGNOSIS — I739 Peripheral vascular disease, unspecified: Secondary | ICD-10-CM

## 2020-08-16 ENCOUNTER — Ambulatory Visit (INDEPENDENT_AMBULATORY_CARE_PROVIDER_SITE_OTHER): Payer: Medicare HMO | Admitting: Vascular Surgery

## 2020-08-16 ENCOUNTER — Other Ambulatory Visit: Payer: Self-pay

## 2020-08-16 ENCOUNTER — Ambulatory Visit (INDEPENDENT_AMBULATORY_CARE_PROVIDER_SITE_OTHER): Payer: Medicare HMO

## 2020-08-16 ENCOUNTER — Encounter (INDEPENDENT_AMBULATORY_CARE_PROVIDER_SITE_OTHER): Payer: Self-pay | Admitting: Vascular Surgery

## 2020-08-16 VITALS — BP 152/87 | HR 74 | Resp 16 | Wt 169.0 lb

## 2020-08-16 DIAGNOSIS — I70222 Atherosclerosis of native arteries of extremities with rest pain, left leg: Secondary | ICD-10-CM

## 2020-08-16 DIAGNOSIS — E785 Hyperlipidemia, unspecified: Secondary | ICD-10-CM

## 2020-08-16 DIAGNOSIS — I739 Peripheral vascular disease, unspecified: Secondary | ICD-10-CM

## 2020-08-16 DIAGNOSIS — I10 Essential (primary) hypertension: Secondary | ICD-10-CM | POA: Diagnosis not present

## 2020-08-16 NOTE — Assessment & Plan Note (Signed)
blood pressure control important in reducing the progression of atherosclerotic disease. On appropriate oral medications.  

## 2020-08-16 NOTE — Progress Notes (Signed)
MRN : [id number]  Lindsay [name] is a 79 y.o. (1941/10/18) female who presents with chief complaint of  Chief Complaint  Patient presents with  . Follow-up    ultrasound follow up  .  History of Present Illness: Patient returns today in follow up of her PAD.  She is overall doing well.  She still has some pain in her left toes but after her ischemic event 8 months ago where there was significant risk of limb loss, she is markedly improved.  She continues on Eliquis although she finds it quite expensive.  She is also on a statin agent.  No new issues of rest pain or ulceration since her last visit.  Does have some mild claudication symptoms on the right. Her ABIs today are 0.68 on the right and 1.03 on the left with biphasic waveforms bilaterally  Current Outpatient Medications  Medication Sig Dispense Refill  . acetaminophen (TYLENOL) 500 MG tablet Take 1,000 mg by mouth daily.    Marland Kitchen amLODipine (NORVASC) 5 MG tablet Take 10 mg by mouth daily.     . bisoprolol-hydrochlorothiazide (ZIAC) 10-6.25 MG tablet TAKE 1 TABLET BY MOUTH DAILY (Patient taking differently: Take 1 tablet by mouth daily. ) 30 tablet 0  . DiphenhydrAMINE HCl (ALKA-SELTZER PLUS ALLERGY PO) Take 1 tablet by mouth daily as needed (allergies).    Marland Kitchen ELIQUIS 5 MG TABS tablet TAKE 1 TABLET BY MOUTH TWICE A DAY 60 tablet 5  . gabapentin (NEURONTIN) 300 MG capsule Take 1 capsule (300 mg total) by mouth 2 (two) times daily. 60 capsule 1  . levothyroxine (SYNTHROID, LEVOTHROID) 50 MCG tablet TAKE ONE (1) TABLET BY MOUTH EVERY DAY (Patient taking differently: Take 50 mcg by mouth daily. ) 90 tablet 0  . losartan (COZAAR) 100 MG tablet TAKE ONE (1) TABLET BY MOUTH EVERY DAY (Patient taking differently: Take 100 mg by mouth daily. ) 90 tablet 3  . Multiple Vitamins-Minerals (MULTI COMPLETE PO) Take 1 tablet by mouth daily.     . pantoprazole (PROTONIX) 40 MG tablet TAKE ONE (1) TABLET EACH DAY (Patient taking differently: Take 40 mg by  mouth daily. ) 90 tablet 3  . atorvastatin (LIPITOR) 10 MG tablet Take 1 tablet (10 mg total) by mouth daily. 90 tablet 3  . nicotine (NICODERM CQ - DOSED IN MG/24 HOURS) 21 mg/24hr patch Place 1 patch (21 mg total) onto the skin daily. (Patient not taking: Reported on 04/19/2020) 28 patch 0  . oxyCODONE (OXY IR/ROXICODONE) 5 MG immediate release tablet One to Two Tabs Every Six Hours As Needed For Pain (Patient not taking: Reported on 04/19/2020) 50 tablet 0   No current facility-administered medications for this visit.    Past Medical History:  Diagnosis Date  . Anemia   . Anxiety   . Arthritis    osteo, back  . Depression   . Diverticulosis   . GERD (gastroesophageal reflux disease)   . Hematochezia   . Hypertension    controlled on meds  . Hypothyroidism   . PAD (peripheral artery disease) ([place])     Past Surgical History:  Procedure Laterality Date  . ABDOMINAL HYSTERECTOMY    . CARDIAC CATHETERIZATION  1995   normal, Dr Clayborn Bigness  . COLONOSCOPY  12/29/08  . COLONOSCOPY N/A 04/18/2015   Procedure: COLONOSCOPY;  Surgeon: Lucilla Lame, MD;  Location: [facility];  Service: Gastroenterology;  Laterality: N/A;  cecum time- 0957  . FOOT SURGERY Right   . KNEE ARTHROSCOPY Right  10/05/2015   Procedure: ARTHROSCOPY KNEE, partial lateral menisectomy;  Surgeon: Earnestine Leys, MD;  Location: [facility];  Service: Orthopedics;  Laterality: Right;  . LOWER EXTREMITY ANGIOGRAPHY Left 02/27/2018   Procedure: LOWER EXTREMITY ANGIOGRAPHY;  Surgeon: Algernon Huxley, MD;  Location: [facility];  Service: Cardiovascular;  Laterality: Left;  . LOWER EXTREMITY ANGIOGRAPHY Left 10/15/2018   Procedure: LOWER EXTREMITY ANGIOGRAPHY;  Surgeon: Algernon Huxley, MD;  Location: [facility];  Service: Cardiovascular;  Laterality: Left;  . LOWER EXTREMITY ANGIOGRAPHY Left 12/29/2018   Procedure: LOWER EXTREMITY ANGIOGRAPHY;  Surgeon: Algernon Huxley, MD;  Location: [facility];  Service:  Cardiovascular;  Laterality: Left;  . LOWER EXTREMITY ANGIOGRAPHY Left 12/23/2019   Procedure: LOWER EXTREMITY ANGIOGRAPHY;  Surgeon: Algernon Huxley, MD;  Location: [facility];  Service: Cardiovascular;  Laterality: Left;  . LOWER EXTREMITY ANGIOGRAPHY Left 12/24/2019   Procedure: Lower Extremity Angiography;  Surgeon: Algernon Huxley, MD;  Location: [facility];  Service: Cardiovascular;  Laterality: Left;  . POLYPECTOMY  04/18/2015   Procedure: POLYPECTOMY INTESTINAL;  Surgeon: Lucilla Lame, MD;  Location: [facility];  Service: Gastroenterology;;  . WRIST SURGERY Right      Social History   Tobacco Use  . Smoking status: Light Tobacco Smoker    Packs/day: 0.50    Years: 30.00    Pack years: 15.00    Types: Cigarettes    Last attempt to quit: 10/14/2018    Years since quitting: 1.8  . Smokeless tobacco: Never Used  Vaping Use  . Vaping Use: Never used  Substance Use Topics  . Alcohol use: Yes    Alcohol/week: 0.0 standard drinks    Comment: occassionally/ whiskey on weekend; total of 4 drinks per week  . Drug use: No      Family History  Problem Relation Age of Onset  . Diabetes Mother   . Hypertension Mother   . Diabetes Sister   . Hypertension Sister      Allergies  Allergen Reactions  . Aspirin Other (See Comments)    GI BLEED  . Nsaids Other (See Comments)    GI BLEED  . Penicillins Itching, Rash and Other (See Comments)    Has patient had a PCN reaction causing immediate rash, facial/tongue/throat swelling, SOB or lightheadedness with hypotension: Yes Has patient had a PCN reaction causing severe rash involving mucus membranes or skin necrosis: No Has patient had a PCN reaction that required hospitalization No Has patient had a PCN reaction occurring within the last 10 years: No If all of the above answers are "NO", then may proceed with Cephalosporin use.   . Pollen Extract Other (See Comments)    Congestion, Eye Irritation      REVIEW OF SYSTEMS (Negative unless checked)  Constitutional: [] Weight loss  [] Fever  [] Chills Cardiac: [] Chest pain   [] Chest pressure   [] Palpitations   [] Shortness of breath when laying flat   [] Shortness of breath at rest   [] Shortness of breath with exertion. Vascular:  [x] Pain in legs with walking   [] Pain in legs at rest   [] Pain in legs when laying flat   [x] Claudication   [] Pain in feet when walking  [] Pain in feet at rest  [] Pain in feet when laying flat   [] History of DVT   [] Phlebitis   [] Swelling in legs   [] Varicose veins   [] Non-healing ulcers Pulmonary:   [] Uses home oxygen   [] Productive cough   []   Hemoptysis   [] Wheeze  [] COPD   [] Asthma Neurologic:  [] Dizziness  [] Blackouts   [] Seizures   [] History of stroke   [] History of TIA  [] Aphasia   [] Temporary blindness   [] Dysphagia   [] Weakness or numbness in arms   [] Weakness or numbness in legs Musculoskeletal:  [x] Arthritis   [] Joint swelling   [x] Joint pain   [] Low back pain Hematologic:  [] Easy bruising  [] Easy bleeding   [] Hypercoagulable state   [x] Anemic   Gastrointestinal:  [] Blood in stool   [] Vomiting blood  [x] Gastroesophageal reflux/heartburn   [] Abdominal pain Genitourinary:  [] Chronic kidney disease   [] Difficult urination  [] Frequent urination  [] Burning with urination   [] Hematuria Skin:  [] Rashes   [] Ulcers   [] Wounds Psychological:  [x] History of anxiety   []  History of major depression.  Physical Examination  BP (!) 152/87 (BP Location: Right Arm)   Pulse 74   Resp 16   Wt 169 lb (76.7 kg)   BMI 28.12 kg/m  Gen:  WD/WN, NAD Head: [place]/AT, No temporalis wasting. Ear/Nose/Throat: Hearing grossly intact, nares w/o erythema or drainage Eyes: Conjunctiva clear. Sclera non-icteric Neck: Supple.  Trachea midline Pulmonary:  Good air movement, no use of accessory muscles.  Cardiac: RRR, no JVD Vascular:  Vessel Right Left  Radial Palpable Palpable                          PT 1+ Palpable 1+ Palpable   DP 1+ Palpable Palpable   Gastrointestinal: soft, non-tender/non-distended. No guarding/reflex.  Musculoskeletal: M/S 5/5 throughout.  No deformity or atrophy. Trace LE edema. Neurologic: Sensation grossly intact in extremities.  Symmetrical.  Speech is fluent.  Psychiatric: Judgment intact, Mood & affect appropriate for pt's clinical situation. Dermatologic: No rashes or ulcers noted.  No cellulitis or open wounds.       Labs No results found for this or any previous visit (from the past 2160 hour(s)).  Radiology No results found.  Assessment/Plan  Atherosclerosis of native arteries of extremity with rest pain (HCC) About 8 months status post limb salvage procedure with extensive left lower extremity revascularization.  She has done quite well since that time.  She still has some pain in her toes, but overall the legs are doing well.  Her biggest issue now is arthritic knee pain and although she has some disease in the right leg, she certainly has adequate blood flow for wound healing of the knee replacement.  Her ABIs today are 0.68 on the right and 1.03 on the left with biphasic waveforms bilaterally  Hypertension blood pressure control important in reducing the progression of atherosclerotic disease. On appropriate oral medications.   Hyperlipidemia lipid control important in reducing the progression of atherosclerotic disease. Continue statin therapy     Leotis Pain, MD  08/16/2020 10:16 AM    This note was created with Dragon medical transcription system.  Any errors from dictation are purely unintentional

## 2020-08-16 NOTE — Assessment & Plan Note (Signed)
lipid control important in reducing the progression of atherosclerotic disease. Continue statin therapy  

## 2020-08-16 NOTE — Assessment & Plan Note (Signed)
About 8 months status post limb salvage procedure with extensive left lower extremity revascularization.  She has done quite well since that time.  She still has some pain in her toes, but overall the legs are doing well.  Her biggest issue now is arthritic knee pain and although she has some disease in the right leg, she certainly has adequate blood flow for wound healing of the knee replacement.  Her ABIs today are 0.68 on the right and 1.03 on the left with biphasic waveforms bilaterally

## 2020-08-31 ENCOUNTER — Telehealth (INDEPENDENT_AMBULATORY_CARE_PROVIDER_SITE_OTHER): Payer: Self-pay | Admitting: Vascular Surgery

## 2020-08-31 NOTE — Telephone Encounter (Signed)
Called stating that she has tried to reach NP (FB) on mychart. Patient is requesting eliquis samples. Patient was last seen 08-16-20 with ABI studies (JD). Please advise.

## 2020-08-31 NOTE — Telephone Encounter (Signed)
I haven't seen any recent messages from Ms. Pietrzak and I don't see any that are stored from [facility] but  we can get her some samples ...4 boxes

## 2020-09-01 ENCOUNTER — Encounter (INDEPENDENT_AMBULATORY_CARE_PROVIDER_SITE_OTHER): Payer: Self-pay

## 2020-09-05 NOTE — Telephone Encounter (Signed)
Patient picked up samples 09-02-20

## 2020-09-14 ENCOUNTER — Ambulatory Visit: Payer: Self-pay

## 2020-09-14 ENCOUNTER — Other Ambulatory Visit: Payer: Self-pay

## 2020-10-25 ENCOUNTER — Ambulatory Visit (INDEPENDENT_AMBULATORY_CARE_PROVIDER_SITE_OTHER): Payer: Medicare HMO | Admitting: Vascular Surgery

## 2020-10-25 ENCOUNTER — Encounter (INDEPENDENT_AMBULATORY_CARE_PROVIDER_SITE_OTHER): Payer: Medicare HMO

## 2021-02-10 ENCOUNTER — Other Ambulatory Visit: Payer: Self-pay

## 2021-02-10 ENCOUNTER — Ambulatory Visit (INDEPENDENT_AMBULATORY_CARE_PROVIDER_SITE_OTHER): Payer: Medicare HMO | Admitting: Vascular Surgery

## 2021-02-10 ENCOUNTER — Encounter (INDEPENDENT_AMBULATORY_CARE_PROVIDER_SITE_OTHER): Payer: Self-pay | Admitting: Vascular Surgery

## 2021-02-10 ENCOUNTER — Ambulatory Visit (INDEPENDENT_AMBULATORY_CARE_PROVIDER_SITE_OTHER): Payer: Medicare HMO

## 2021-02-10 VITALS — BP 127/77 | HR 62 | Ht 65.0 in | Wt 157.0 lb

## 2021-02-10 DIAGNOSIS — I10 Essential (primary) hypertension: Secondary | ICD-10-CM

## 2021-02-10 DIAGNOSIS — I70222 Atherosclerosis of native arteries of extremities with rest pain, left leg: Secondary | ICD-10-CM | POA: Diagnosis not present

## 2021-02-10 DIAGNOSIS — E785 Hyperlipidemia, unspecified: Secondary | ICD-10-CM

## 2021-02-10 NOTE — Assessment & Plan Note (Signed)
Her ABIs today are stable at 0.62 on the right and 0.96 on the left without significant change from previous studies.  Multiple previous revascularizations mostly on the left leg.  Stable and doing well.  No change in medical regimen.  Recheck in 6 months

## 2021-02-10 NOTE — Progress Notes (Signed)
MRN : [id number]  BIANNCA SCANTLIN is a 80 y.o. (February 21, 1941) female who presents with chief complaint of  Chief Complaint  Patient presents with  . Follow-up    6 Mo U/S  .  History of Present Illness: Patient returns today in follow up of her PAD.  She is doing reasonably well.  She has neuropathic pain in her feet but this is unchanged.  No new ulcerations or infections.  Her ABIs today are stable at 0.62 on the right and 0.96 on the left without significant change from previous studies.  Current Outpatient Medications  Medication Sig Dispense Refill  . acetaminophen (TYLENOL) 500 MG tablet Take 1,000 mg by mouth daily.    Marland Kitchen amLODipine (NORVASC) 10 MG tablet Take 10 mg by mouth daily.    Marland Kitchen amLODipine (NORVASC) 5 MG tablet Take 10 mg by mouth daily.     . bisoprolol-hydrochlorothiazide (ZIAC) 10-6.25 MG tablet TAKE 1 TABLET BY MOUTH DAILY (Patient taking differently: Take 1 tablet by mouth daily.) 30 tablet 0  . DiphenhydrAMINE HCl (ALKA-SELTZER PLUS ALLERGY PO) Take 1 tablet by mouth daily as needed (allergies).    Marland Kitchen ELIQUIS 5 MG TABS tablet TAKE 1 TABLET BY MOUTH TWICE A DAY 60 tablet 5  . gabapentin (NEURONTIN) 300 MG capsule Take 1 capsule (300 mg total) by mouth 2 (two) times daily. 60 capsule 1  . levothyroxine (SYNTHROID, LEVOTHROID) 50 MCG tablet TAKE ONE (1) TABLET BY MOUTH EVERY DAY (Patient taking differently: Take 50 mcg by mouth daily.) 90 tablet 0  . losartan (COZAAR) 100 MG tablet TAKE ONE (1) TABLET BY MOUTH EVERY DAY (Patient taking differently: Take 100 mg by mouth daily.) 90 tablet 3  . Multiple Vitamins-Minerals (MULTI COMPLETE PO) Take 1 tablet by mouth daily.     . nicotine (NICODERM CQ - DOSED IN MG/24 HOURS) 21 mg/24hr patch Place 1 patch (21 mg total) onto the skin daily. 28 patch 0  . oxyCODONE (OXY IR/ROXICODONE) 5 MG immediate release tablet One to Two Tabs Every Six Hours As Needed For Pain 50 tablet 0  . pantoprazole (PROTONIX) 40 MG tablet TAKE ONE (1)  TABLET EACH DAY (Patient taking differently: Take 40 mg by mouth daily.) 90 tablet 3  . predniSONE (DELTASONE) 20 MG tablet Take 20 mg by mouth daily.    . valACYclovir (VALTREX) 1000 MG tablet Take by mouth.    Marland Kitchen atorvastatin (LIPITOR) 10 MG tablet Take 1 tablet (10 mg total) by mouth daily. 90 tablet 3   No current facility-administered medications for this visit.    Past Medical History:  Diagnosis Date  . Anemia   . Anxiety   . Arthritis    osteo, back  . Depression   . Diverticulosis   . GERD (gastroesophageal reflux disease)   . Hematochezia   . Hypertension    controlled on meds  . Hypothyroidism   . PAD (peripheral artery disease) ([place])     Past Surgical History:  Procedure Laterality Date  . ABDOMINAL HYSTERECTOMY    . CARDIAC CATHETERIZATION  1995   normal, Dr Clayborn Bigness  . COLONOSCOPY  12/29/08  . COLONOSCOPY N/A 04/18/2015   Procedure: COLONOSCOPY;  Surgeon: Lucilla Lame, MD;  Location: [facility];  Service: Gastroenterology;  Laterality: N/A;  cecum time- 0957  . FOOT SURGERY Right   . KNEE ARTHROSCOPY Right 10/05/2015   Procedure: ARTHROSCOPY KNEE, partial lateral menisectomy;  Surgeon: Earnestine Leys, MD;  Location: [facility];  Service: Orthopedics;  Laterality:  Right;  Marland Kitchen LOWER EXTREMITY ANGIOGRAPHY Left 02/27/2018   Procedure: LOWER EXTREMITY ANGIOGRAPHY;  Surgeon: Algernon Huxley, MD;  Location: [place] [facility];  Service: Cardiovascular;  Laterality: Left;  . LOWER EXTREMITY ANGIOGRAPHY Left 10/15/2018   Procedure: LOWER EXTREMITY ANGIOGRAPHY;  Surgeon: Algernon Huxley, MD;  Location: [place] [facility];  Service: Cardiovascular;  Laterality: Left;  . LOWER EXTREMITY ANGIOGRAPHY Left 12/29/2018   Procedure: LOWER EXTREMITY ANGIOGRAPHY;  Surgeon: Algernon Huxley, MD;  Location: [place] [facility];  Service: Cardiovascular;  Laterality: Left;  . LOWER EXTREMITY ANGIOGRAPHY Left 12/23/2019   Procedure: LOWER EXTREMITY ANGIOGRAPHY;  Surgeon: Algernon Huxley, MD;   Location: [place] [facility];  Service: Cardiovascular;  Laterality: Left;  . LOWER EXTREMITY ANGIOGRAPHY Left 12/24/2019   Procedure: Lower Extremity Angiography;  Surgeon: Algernon Huxley, MD;  Location: [place] [facility];  Service: Cardiovascular;  Laterality: Left;  . POLYPECTOMY  04/18/2015   Procedure: POLYPECTOMY INTESTINAL;  Surgeon: Lucilla Lame, MD;  Location: [place];  Service: Gastroenterology;;  . WRIST SURGERY Right      Social History   Tobacco Use  . Smoking status: Light Tobacco Smoker    Packs/day: 0.50    Years: 30.00    Pack years: 15.00    Types: Cigarettes    Last attempt to quit: 10/14/2018    Years since quitting: 2.3  . Smokeless tobacco: Never Used  Vaping Use  . Vaping Use: Never used  Substance Use Topics  . Alcohol use: Yes    Alcohol/week: 0.0 standard drinks    Comment: occassionally/ whiskey on weekend; total of 4 drinks per week  . Drug use: No      Family History  Problem Relation Age of Onset  . Diabetes Mother   . Hypertension Mother   . Diabetes Sister   . Hypertension Sister      Allergies  Allergen Reactions  . Aspirin Other (See Comments)    GI BLEED  . Nsaids Other (See Comments)    GI BLEED  . Penicillins Itching, Rash and Other (See Comments)    Has patient had a PCN reaction causing immediate rash, facial/tongue/throat swelling, SOB or lightheadedness with hypotension: Yes Has patient had a PCN reaction causing severe rash involving mucus membranes or skin necrosis: No Has patient had a PCN reaction that required hospitalization No Has patient had a PCN reaction occurring within the last 10 years: No If all of the above answers are "NO", then may proceed with Cephalosporin use.   . Pollen Extract Other (See Comments)    Congestion, Eye Irritation     REVIEW OF SYSTEMS (Negative unless checked)  Constitutional: [] ?Weight loss  [] ?Fever  [] ?Chills Cardiac: [] ?Chest pain   [] ?Chest pressure    [] ?Palpitations   [] ?Shortness of breath when laying flat   [] ?Shortness of breath at rest   [] ?Shortness of breath with exertion. Vascular:  [x] ?Pain in legs with walking   [] ?Pain in legs at rest   [] ?Pain in legs when laying flat   [x] ?Claudication   [] ?Pain in feet when walking  [] ?Pain in feet at rest  [] ?Pain in feet when laying flat   [] ?History of DVT   [] ?Phlebitis   [] ?Swelling in legs   [] ?Varicose veins   [] ?Non-healing ulcers Pulmonary:   [] ?Uses home oxygen   [] ?Productive cough   [] ?Hemoptysis   [] ?Wheeze  [] ?COPD   [] ?Asthma Neurologic:  [] ?Dizziness  [] ?Blackouts   [] ?Seizures   [] ?History of stroke   [] ?  History of TIA  [] ?Aphasia   [] ?Temporary blindness   [] ?Dysphagia   [] ?Weakness or numbness in arms   [] ?Weakness or numbness in legs Musculoskeletal:  [x] ?Arthritis   [] ?Joint swelling   [x] ?Joint pain   [] ?Low back pain Hematologic:  [] ?Easy bruising  [] ?Easy bleeding   [] ?Hypercoagulable state   [x] ?Anemic   Gastrointestinal:  [] ?Blood in stool   [] ?Vomiting blood  [x] ?Gastroesophageal reflux/heartburn   [] ?Abdominal pain Genitourinary:  [] ?Chronic kidney disease   [] ?Difficult urination  [] ?Frequent urination  [] ?Burning with urination   [] ?Hematuria Skin:  [] ?Rashes   [] ?Ulcers   [] ?Wounds Psychological:  [x] ?History of anxiety   [] ? History of major depression.  Physical Examination  BP 127/77   Pulse 62   Ht 5\' 5"  (1.651 m)   Wt 157 lb (71.2 kg)   BMI 26.13 kg/m  Gen:  WD/WN, NAD Head: [place]/AT, No temporalis wasting. Ear/Nose/Throat: Hearing grossly intact, nares w/o erythema or drainage Eyes: Conjunctiva clear. Sclera non-icteric Neck: Supple.  Trachea midline Pulmonary:  Good air movement, no use of accessory muscles.  Cardiac: RRR, no JVD Vascular:  Vessel Right Left  Radial Palpable Palpable                          PT 1+ Palpable 2+ Palpable  DP 1+ Palpable 1+ Palpable   Gastrointestinal: soft, non-tender/non-distended. No guarding/reflex.   Musculoskeletal: M/S 5/5 throughout.  No deformity or atrophy. Trace LE edema. Neurologic: Sensation grossly intact in extremities.  Symmetrical.  Speech is fluent.  Psychiatric: Judgment intact, Mood & affect appropriate for pt's clinical situation. Dermatologic: No rashes or ulcers noted.  No cellulitis or open wounds.      Labs No results found for this or any previous visit (from the past 2160 hour(s)).  Radiology No results found.  Assessment/Plan  Hypertension blood pressure control important in reducing the progression of atherosclerotic disease. On appropriate oral medications.   Hyperlipidemia lipid control important in reducing the progression of atherosclerotic disease. Continue statin therapy  Atherosclerosis of native arteries of extremity with rest pain (HCC) Her ABIs today are stable at 0.62 on the right and 0.96 on the left without significant change from previous studies.  Multiple previous revascularizations mostly on the left leg.  Stable and doing well.  No change in medical regimen.  Recheck in 6 months    Leotis Pain, MD  02/10/2021 11:03 AM    This note was created with Dragon medical transcription system.  Any errors from dictation are purely unintentional

## 2021-03-13 ENCOUNTER — Other Ambulatory Visit (INDEPENDENT_AMBULATORY_CARE_PROVIDER_SITE_OTHER): Payer: Self-pay | Admitting: Nurse Practitioner

## 2021-04-25 DIAGNOSIS — F17200 Nicotine dependence, unspecified, uncomplicated: Secondary | ICD-10-CM | POA: Insufficient documentation

## 2021-05-18 ENCOUNTER — Encounter (INDEPENDENT_AMBULATORY_CARE_PROVIDER_SITE_OTHER): Payer: Self-pay

## 2021-07-31 ENCOUNTER — Encounter (INDEPENDENT_AMBULATORY_CARE_PROVIDER_SITE_OTHER): Payer: Self-pay

## 2021-08-01 ENCOUNTER — Encounter (INDEPENDENT_AMBULATORY_CARE_PROVIDER_SITE_OTHER): Payer: Self-pay

## 2021-08-15 ENCOUNTER — Ambulatory Visit (INDEPENDENT_AMBULATORY_CARE_PROVIDER_SITE_OTHER): Payer: Medicare HMO

## 2021-08-15 ENCOUNTER — Ambulatory Visit (INDEPENDENT_AMBULATORY_CARE_PROVIDER_SITE_OTHER): Payer: Medicare HMO | Admitting: Vascular Surgery

## 2021-08-15 ENCOUNTER — Encounter (INDEPENDENT_AMBULATORY_CARE_PROVIDER_SITE_OTHER): Payer: Self-pay | Admitting: Vascular Surgery

## 2021-08-15 ENCOUNTER — Other Ambulatory Visit: Payer: Self-pay

## 2021-08-15 VITALS — BP 125/74 | HR 55 | Resp 16 | Wt 157.6 lb

## 2021-08-15 DIAGNOSIS — I70223 Atherosclerosis of native arteries of extremities with rest pain, bilateral legs: Secondary | ICD-10-CM | POA: Diagnosis not present

## 2021-08-15 DIAGNOSIS — E785 Hyperlipidemia, unspecified: Secondary | ICD-10-CM | POA: Diagnosis not present

## 2021-08-15 DIAGNOSIS — I70222 Atherosclerosis of native arteries of extremities with rest pain, left leg: Secondary | ICD-10-CM | POA: Diagnosis not present

## 2021-08-15 DIAGNOSIS — I10 Essential (primary) hypertension: Secondary | ICD-10-CM | POA: Diagnosis not present

## 2021-08-15 DIAGNOSIS — I739 Peripheral vascular disease, unspecified: Secondary | ICD-10-CM

## 2021-08-15 NOTE — Assessment & Plan Note (Signed)
ABIs today are actually improved on each side up to 0.90 on the right and 1.09 on the left both with biphasic waveforms.  Doing well status post multiple revascularization procedures in the past.  Perfusion is currently stable.  We will continue 23-monthfollow-up intervals due to her high risk status with multiple previous interventions

## 2021-08-15 NOTE — Progress Notes (Signed)
MRN : [id number]  Lindsay [name] is a 80 y.o. (06/21/1941) female who presents with chief complaint of  Chief Complaint  Patient presents with   Follow-up    Ultrasound follow up  .  History of Present Illness: Patient returns today in follow up of her PAD.  She is doing reasonably well.  She had 1 episode of pain in her left foot going up her leg a couple of weeks ago, but other than that she has done fine.  No new ulceration or infection.  Not really having significant claudication symptoms.  The cost of the Eliquis is a problem for her.  ABIs today are actually improved on each side up to 0.90 on the right and 1.09 on the left both with biphasic waveforms.  Current Outpatient Medications  Medication Sig Dispense Refill   acetaminophen (TYLENOL) 500 MG tablet Take 1,000 mg by mouth daily.     amLODipine (NORVASC) 10 MG tablet Take 10 mg by mouth daily.     amLODipine (NORVASC) 5 MG tablet Take 10 mg by mouth daily.      atorvastatin (LIPITOR) 10 MG tablet Take 1 tablet (10 mg total) by mouth daily. 90 tablet 3   bisoprolol-hydrochlorothiazide (ZIAC) 10-6.25 MG tablet TAKE 1 TABLET BY MOUTH DAILY (Patient taking differently: Take 1 tablet by mouth daily.) 30 tablet 0   DiphenhydrAMINE HCl (ALKA-SELTZER PLUS ALLERGY PO) Take 1 tablet by mouth daily as needed (allergies).     ELIQUIS 5 MG TABS tablet TAKE 1 TABLET BY MOUTH TWICE A DAY 60 tablet 5   gabapentin (NEURONTIN) 300 MG capsule Take 1 capsule (300 mg total) by mouth 2 (two) times daily. 60 capsule 1   levothyroxine (SYNTHROID, LEVOTHROID) 50 MCG tablet TAKE ONE (1) TABLET BY MOUTH EVERY DAY (Patient taking differently: Take 50 mcg by mouth daily.) 90 tablet 0   losartan (COZAAR) 100 MG tablet TAKE ONE (1) TABLET BY MOUTH EVERY DAY (Patient taking differently: Take 100 mg by mouth daily.) 90 tablet 3   Multiple Vitamins-Minerals (MULTI COMPLETE PO) Take 1 tablet by mouth daily.      nicotine (NICODERM CQ - DOSED IN MG/24 HOURS) 21  mg/24hr patch Place 1 patch (21 mg total) onto the skin daily. 28 patch 0   oxyCODONE (OXY IR/ROXICODONE) 5 MG immediate release tablet One to Two Tabs Every Six Hours As Needed For Pain 50 tablet 0   pantoprazole (PROTONIX) 40 MG tablet TAKE ONE (1) TABLET EACH DAY (Patient taking differently: Take 40 mg by mouth daily.) 90 tablet 3   predniSONE (DELTASONE) 20 MG tablet Take 20 mg by mouth daily. (Patient not taking: Reported on 08/15/2021)     No current facility-administered medications for this visit.    Past Medical History:  Diagnosis Date   Anemia    Anxiety    Arthritis    osteo, back   Depression    Diverticulosis    GERD (gastroesophageal reflux disease)    Hematochezia    Hypertension    controlled on meds   Hypothyroidism    PAD (peripheral artery disease) [facility])     Past Surgical History:  Procedure Laterality Date   ABDOMINAL HYSTERECTOMY     CARDIAC CATHETERIZATION  1995   normal, Dr Clayborn Bigness   COLONOSCOPY  12/29/08   COLONOSCOPY N/A 04/18/2015   Procedure: COLONOSCOPY;  Surgeon: Lucilla Lame, MD;  Location: [place];  Service: Gastroenterology;  Laterality: N/A;  cecum time- 0957   FOOT SURGERY  Right    KNEE ARTHROSCOPY Right 10/05/2015   Procedure: ARTHROSCOPY KNEE, partial lateral menisectomy;  Surgeon: Earnestine Leys, MD;  Location: [facility];  Service: Orthopedics;  Laterality: Right;   LOWER EXTREMITY ANGIOGRAPHY Left 02/27/2018   Procedure: LOWER EXTREMITY ANGIOGRAPHY;  Surgeon: Algernon Huxley, MD;  Location: [place] [facility];  Service: Cardiovascular;  Laterality: Left;   LOWER EXTREMITY ANGIOGRAPHY Left 10/15/2018   Procedure: LOWER EXTREMITY ANGIOGRAPHY;  Surgeon: Algernon Huxley, MD;  Location: [place] [facility];  Service: Cardiovascular;  Laterality: Left;   LOWER EXTREMITY ANGIOGRAPHY Left 12/29/2018   Procedure: LOWER EXTREMITY ANGIOGRAPHY;  Surgeon: Algernon Huxley, MD;  Location: [place] [facility];  Service: Cardiovascular;  Laterality: Left;    LOWER EXTREMITY ANGIOGRAPHY Left 12/23/2019   Procedure: LOWER EXTREMITY ANGIOGRAPHY;  Surgeon: Algernon Huxley, MD;  Location: [place] [facility];  Service: Cardiovascular;  Laterality: Left;   LOWER EXTREMITY ANGIOGRAPHY Left 12/24/2019   Procedure: Lower Extremity Angiography;  Surgeon: Algernon Huxley, MD;  Location: [place] [facility];  Service: Cardiovascular;  Laterality: Left;   POLYPECTOMY  04/18/2015   Procedure: POLYPECTOMY INTESTINAL;  Surgeon: Lucilla Lame, MD;  Location: [place];  Service: Gastroenterology;;   WRIST SURGERY Right      Social History   Tobacco Use   Smoking status: Light Smoker    Packs/day: 0.50    Years: 30.00    Pack years: 15.00    Types: Cigarettes    Last attempt to quit: 10/14/2018    Years since quitting: 2.8   Smokeless tobacco: Never  Vaping Use   Vaping Use: Never used  Substance Use Topics   Alcohol use: Yes    Alcohol/week: 0.0 standard drinks    Comment: occassionally/ whiskey on weekend; total of 4 drinks per week   Drug use: No      Family History  Problem Relation Age of Onset   Diabetes Mother    Hypertension Mother    Diabetes Sister    Hypertension Sister      Allergies  Allergen Reactions   Aspirin Other (See Comments)    GI BLEED   Nsaids Other (See Comments)    GI BLEED   Penicillins Itching, Rash and Other (See Comments)    Has patient had a PCN reaction causing immediate rash, facial/tongue/throat swelling, SOB or lightheadedness with hypotension: Yes Has patient had a PCN reaction causing severe rash involving mucus membranes or skin necrosis: No Has patient had a PCN reaction that required hospitalization No Has patient had a PCN reaction occurring within the last 10 years: No If all of the above answers are "NO", then may proceed with Cephalosporin use.    Pollen Extract Other (See Comments)    Congestion, Eye Irritation    REVIEW OF SYSTEMS (Negative unless checked)   Constitutional:  '[]'$ Weight loss  '[]'$ Fever  '[]'$ Chills Cardiac: '[]'$ Chest pain   '[]'$ Chest pressure   '[]'$ Palpitations   '[]'$ Shortness of breath when laying flat   '[]'$ Shortness of breath at rest   '[]'$ Shortness of breath with exertion. Vascular:  '[x]'$ Pain in legs with walking   '[]'$ Pain in legs at rest   '[]'$ Pain in legs when laying flat   '[x]'$ Claudication   '[]'$ Pain in feet when walking  '[]'$ Pain in feet at rest  '[]'$ Pain in feet when laying flat   '[]'$ History of DVT   '[]'$ Phlebitis   '[]'$ Swelling in legs   '[]'$ Varicose veins   '[]'$ Non-healing ulcers Pulmonary:   '[]'$ Uses home oxygen   '[]'$   Productive cough   '[]'$ Hemoptysis   '[]'$ Wheeze  '[]'$ COPD   '[]'$ Asthma Neurologic:  '[]'$ Dizziness  '[]'$ Blackouts   '[]'$ Seizures   '[]'$ History of stroke   '[]'$ History of TIA  '[]'$ Aphasia   '[]'$ Temporary blindness   '[]'$ Dysphagia   '[]'$ Weakness or numbness in arms   '[]'$ Weakness or numbness in legs Musculoskeletal:  '[x]'$ Arthritis   '[]'$ Joint swelling   '[x]'$ Joint pain   '[]'$ Low back pain Hematologic:  '[]'$ Easy bruising  '[]'$ Easy bleeding   '[]'$ Hypercoagulable state   '[x]'$ Anemic   Gastrointestinal:  '[]'$ Blood in stool   '[]'$ Vomiting blood  '[x]'$ Gastroesophageal reflux/heartburn   '[]'$ Abdominal pain Genitourinary:  '[]'$ Chronic kidney disease   '[]'$ Difficult urination  '[]'$ Frequent urination  '[]'$ Burning with urination   '[]'$ Hematuria Skin:  '[]'$ Rashes   '[]'$ Ulcers   '[]'$ Wounds Psychological:  '[x]'$ History of anxiety   '[]'$  History of major depression.   Physical Examination  BP 125/74 (BP Location: Right Arm)   Pulse (!) 55   Resp 16   Wt 157 lb 9.6 oz (71.5 kg)   BMI 26.23 kg/m  Gen:  WD/WN, NAD Head: [place]/AT, No temporalis wasting. Ear/Nose/Throat: Hearing grossly intact, nares w/o erythema or drainage Eyes: Conjunctiva clear. Sclera non-icteric Neck: Supple.  Trachea midline Pulmonary:  Good air movement, no use of accessory muscles.  Cardiac: RRR, no JVD Vascular:  Vessel Right Left  Radial Palpable Palpable                          PT 1+ Palpable 1+ Palpable  DP 1+ Palpable 2+ Palpable   Gastrointestinal: soft,  non-tender/non-distended. No guarding/reflex.  Musculoskeletal: M/S 5/5 throughout.  No deformity or atrophy. Trace LE edema. Neurologic: Sensation grossly intact in extremities.  Symmetrical.  Speech is fluent.  Psychiatric: Judgment intact, Mood & affect appropriate for pt's clinical situation. Dermatologic: No rashes or ulcers noted.  No cellulitis or open wounds.      Labs No results found for this or any previous visit (from the past 2160 hour(s)).  Radiology No results found.  Assessment/Plan Hypertension blood pressure control important in reducing the progression of atherosclerotic disease. On appropriate oral medications.     Hyperlipidemia lipid control important in reducing the progression of atherosclerotic disease. Continue statin therapy  Atherosclerosis of native arteries of extremity with rest pain (HCC) ABIs today are actually improved on each side up to 0.90 on the right and 1.09 on the left both with biphasic waveforms.  Doing well status post multiple revascularization procedures in the past.  Perfusion is currently stable.  We will continue 68-monthfollow-up intervals due to her high risk status with multiple previous interventions     JLeotis Pain MD  08/15/2021 9:56 AM    This note was created with Dragon medical transcription system.  Any errors from dictation are purely unintentional

## 2021-08-25 IMAGING — US US EXTREM LOW VENOUS*L*
1 series · 13 of 24 positions shown · non-contrast
Comparison: 11/30/2008 low

CLINICAL DATA: Calf pain x1 day, history of DVT

EXAM:
LEFT LOWER EXTREMITY VENOUS DOPPLER ULTRASOUND
TECHNIQUE: Gray-scale sonography with compression, as well as color and duplex
ultrasound, were performed to evaluate the deep venous system(s)
from the level of the common femoral vein through the popliteal and
proximal calf veins.

[Series 1: us extrem low venous*left* · 0.07mm/px · 13 of 40 slices shown]
[im 1/40]
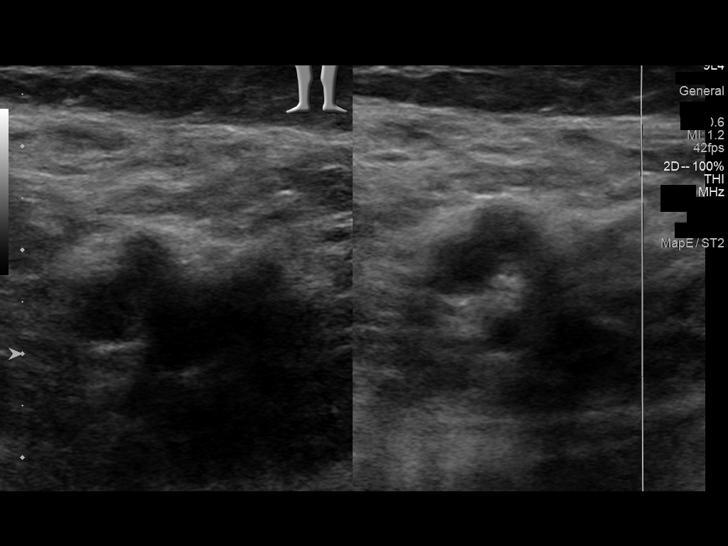
[im 4/40]
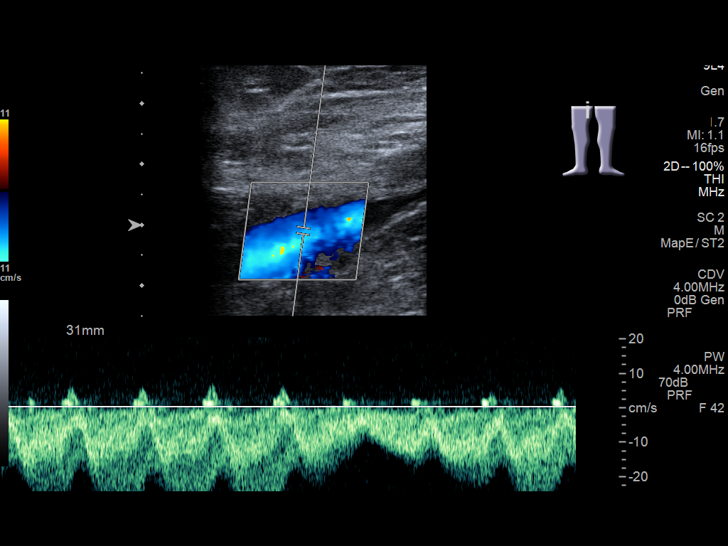
[im 7/40]
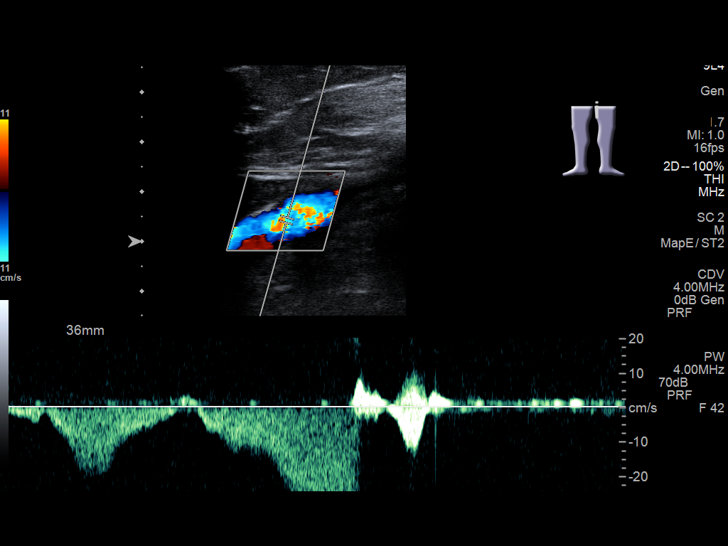
[im 11/40]
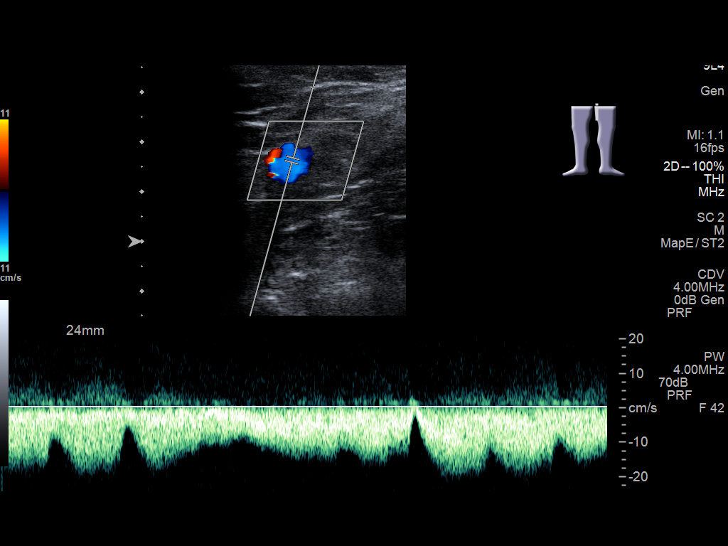
[im 14/40]
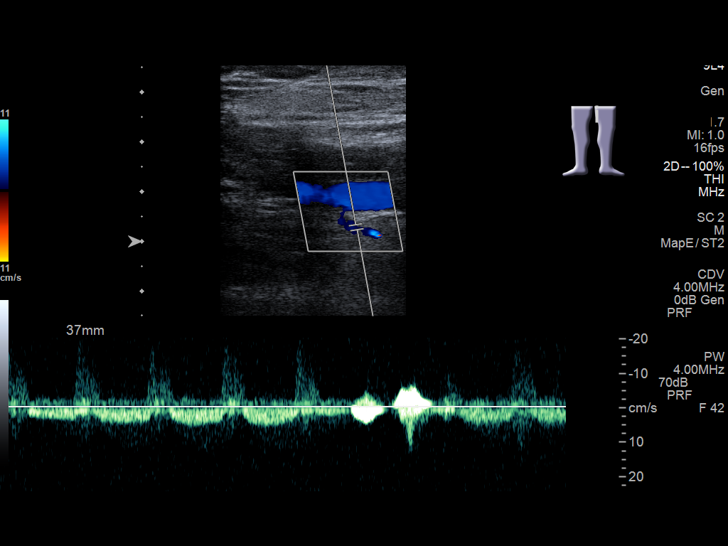
[im 17/40]
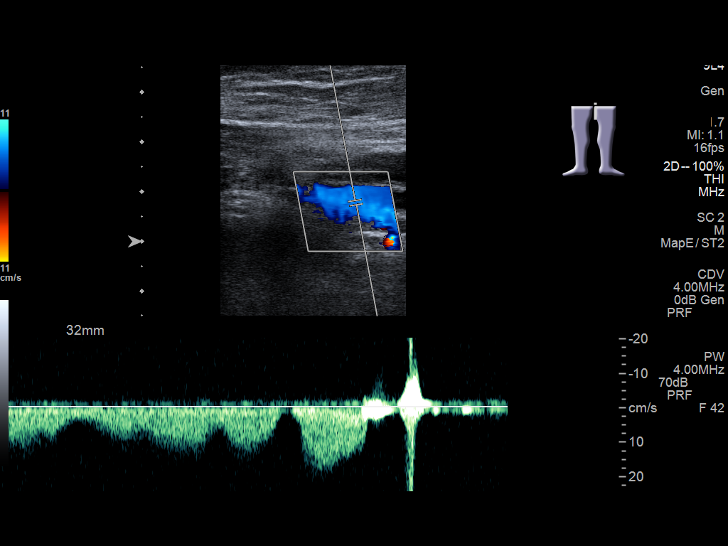
[im 21/40]
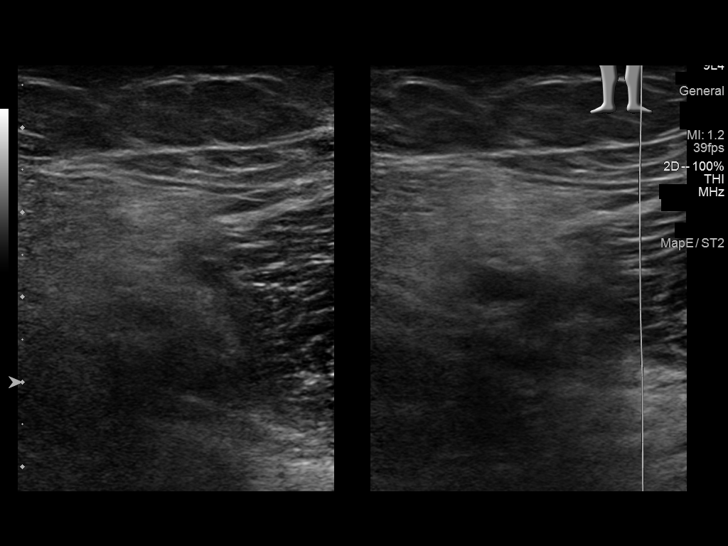
[im 23/40]
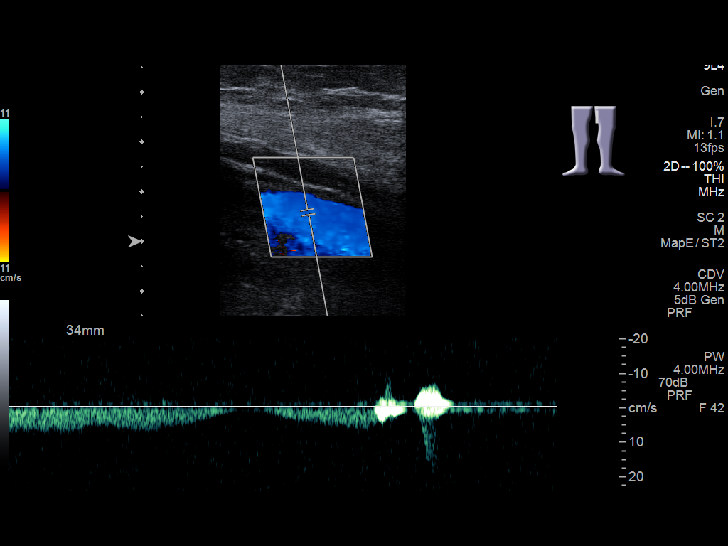
[im 26/40]
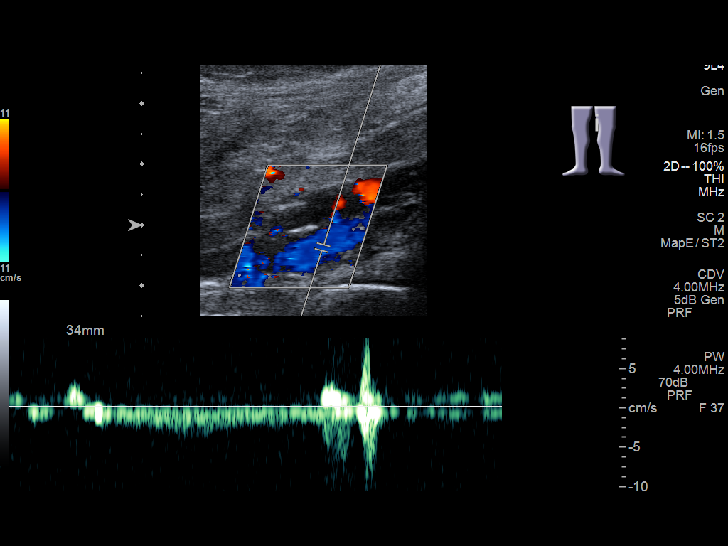
[im 29/40]
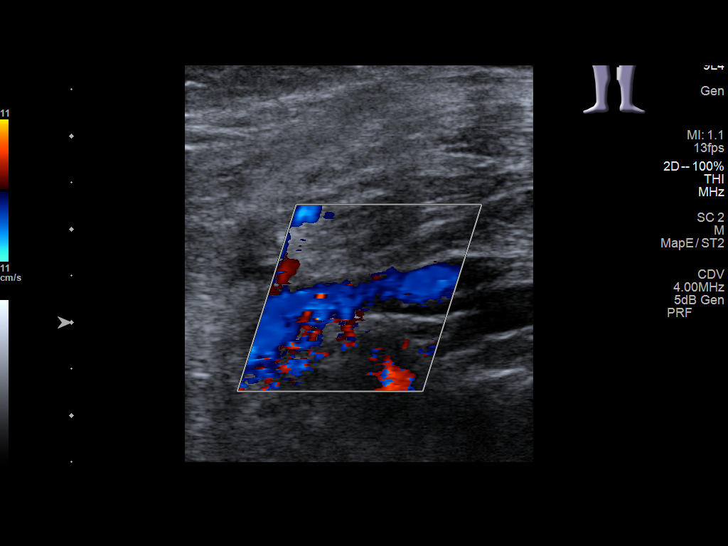
[im 33/40]
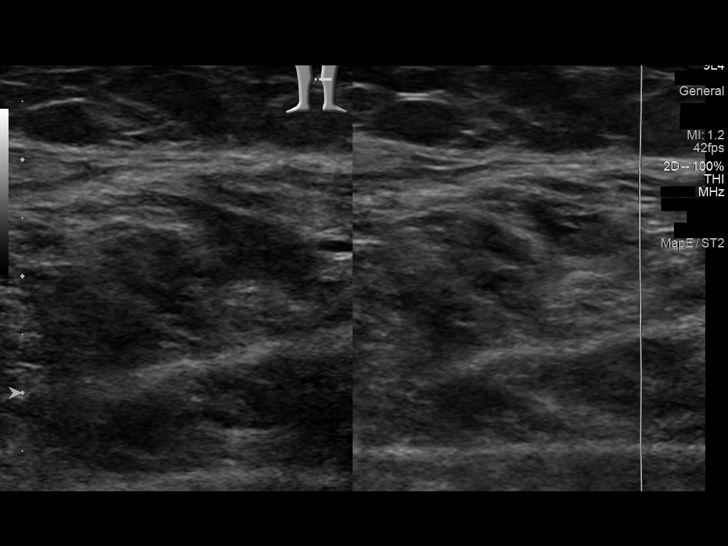
[im 36/40]
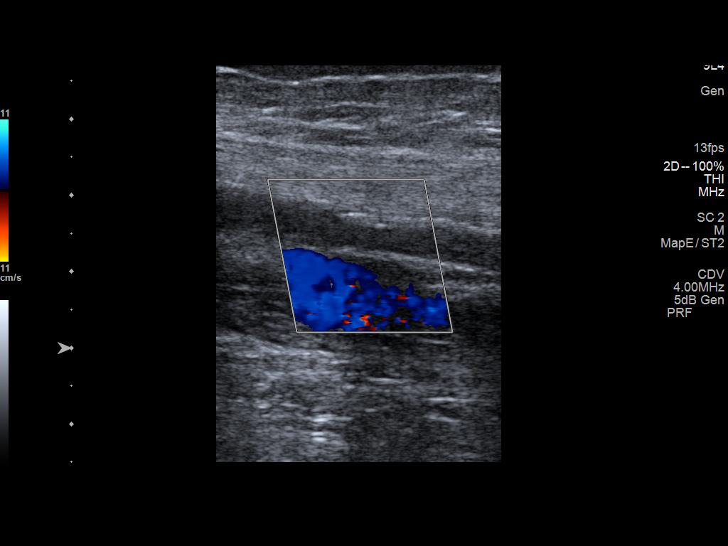
[im 40/40]
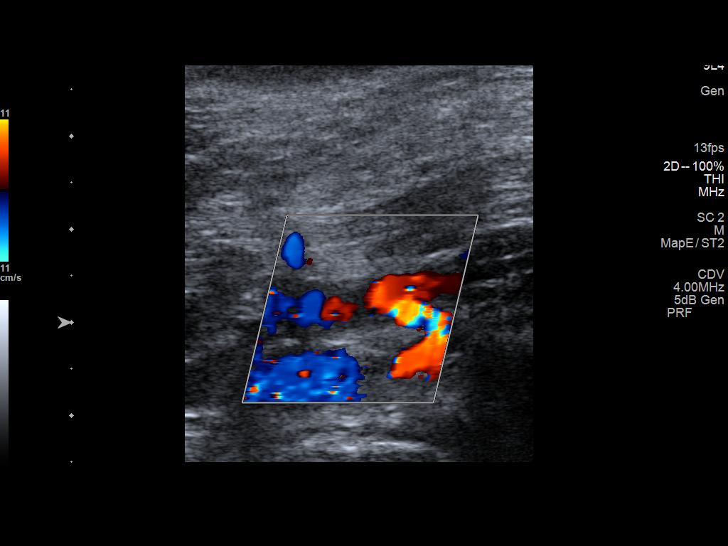

[13 of 24 positions shown; findings below may reference images not displayed]

FINDINGS: VENOUS

Normal compressibility of the common femoral, superficial femoral,
and popliteal veins, as well as the visualized calf veins.
Visualized portions of profunda femoral vein and great saphenous
vein unremarkable. No filling defects to suggest DVT on grayscale or
color Doppler imaging. Doppler waveforms show normal direction of
venous flow, normal respiratory phasicity and response to
augmentation.

Limited views of the contralateral common femoral vein are
unremarkable.

OTHER

Segmental occlusion of the left SFA incidentally noted.

Limitations: none
IMPRESSION: 1. No femoropopliteal DVT nor evidence of DVT within the visualized
calf veins.
If clinical symptoms are inconsistent or if there are persistent or
worsening symptoms, further imaging (possibly involving the iliac
veins) may be warranted.
2. Segmental occlusion of the left SFA. Could the calf pain be
related to arterial insufficiency?

## 2021-08-25 IMAGING — CT CT ANGIO AOBIFEM WO/W CM
2 of 10 series · 11 of 46 positions shown, 15 images · IV contrast (APPLIED)
Comparison: 01/23/2011

CLINICAL DATA: Left calf pain. Negative Doppler venous Doppler
ultrasound. History of peripheral arterial disease.

EXAM:
CT ANGIOGRAPHY OF ABDOMINAL AORTA WITH ILIOFEMORAL RUNOFF
TECHNIQUE: Multidetector CT imaging of the abdomen, pelvis and lower
extremities was performed using the standard protocol during bolus
administration of intravenous contrast. Multiplanar CT image
reconstructions and MIPs were obtained to evaluate the vascular
anatomy.
CONTRAST:  125mL OMNIPAQUE IOHEXOL 350 MG/ML SOLN

[Series 5: axial arterial lower · axial · arterial · 0.63mm/px · z∈[-461,+274]mm · 10 of 292 slices shown, 13 images]
[im 31/292  soft-tissue]
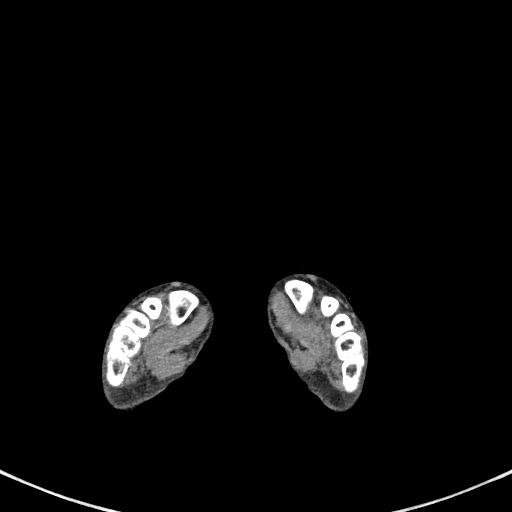
[im 31/292  bone]
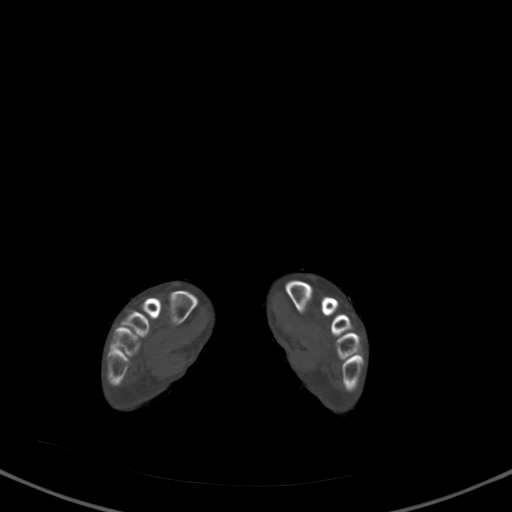
[im 62/292  soft-tissue]
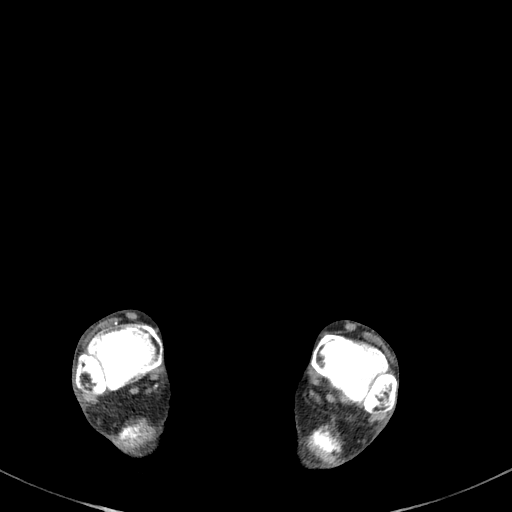
[im 92/292  soft-tissue]
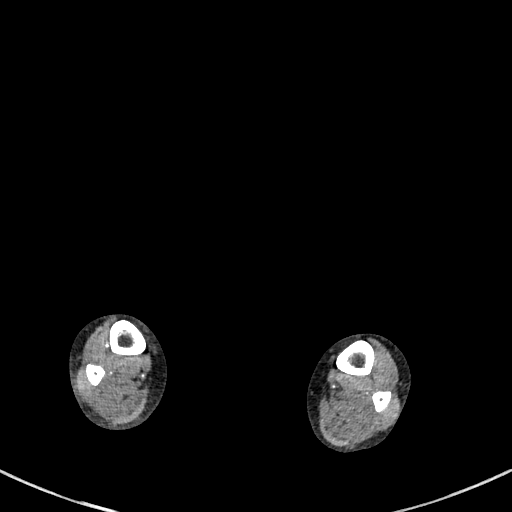
[im 123/292  soft-tissue]
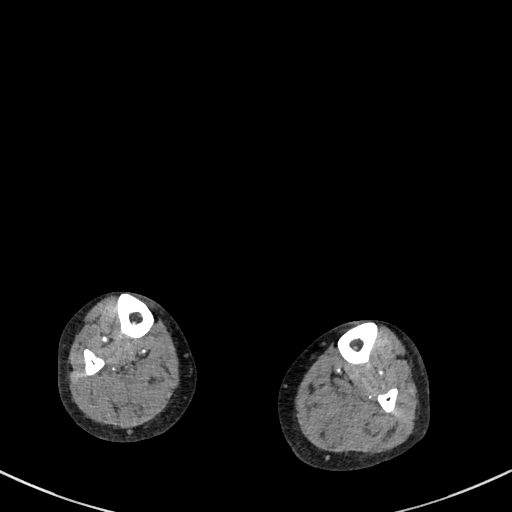
[im 169/292  soft-tissue]
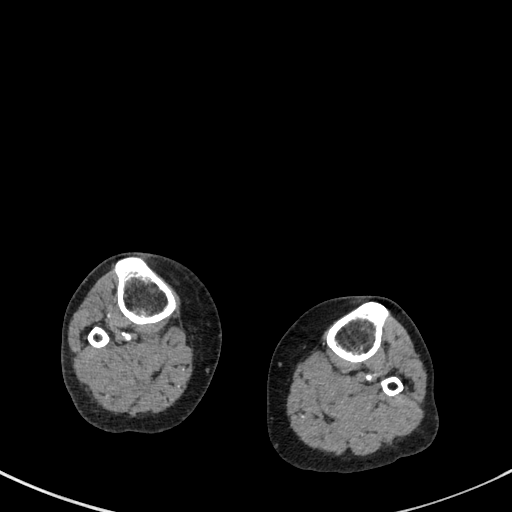
[im 200/292  soft-tissue]
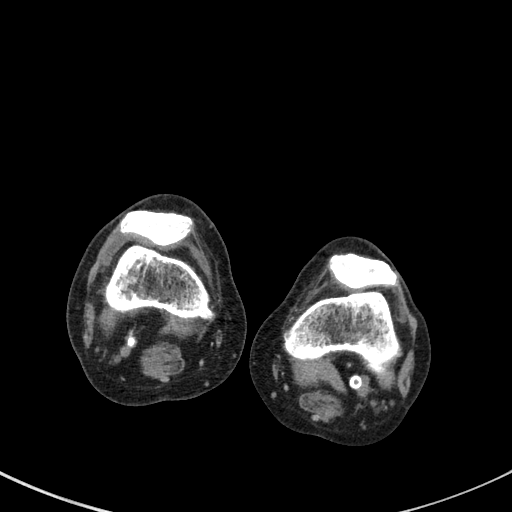
[im 230/292  soft-tissue]
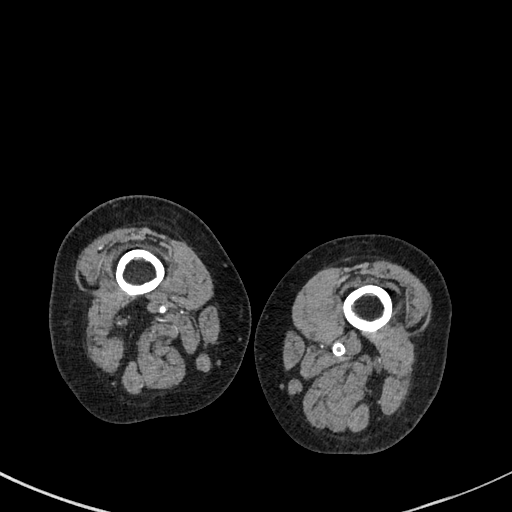
[im 230/292  lung]
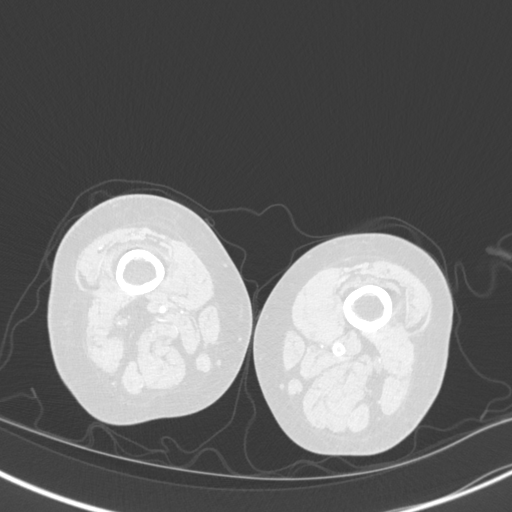
[im 246/292  lung]
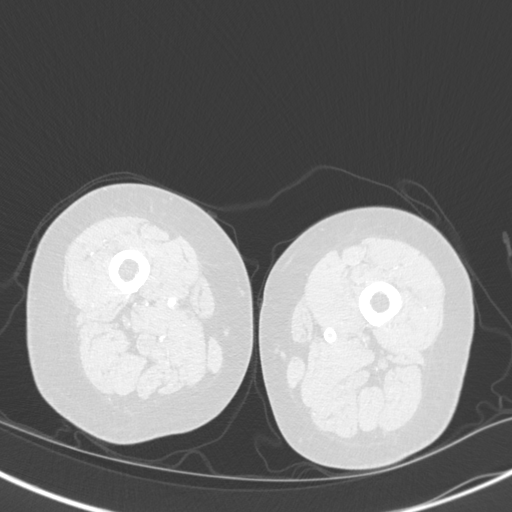
[im 261/292  soft-tissue]
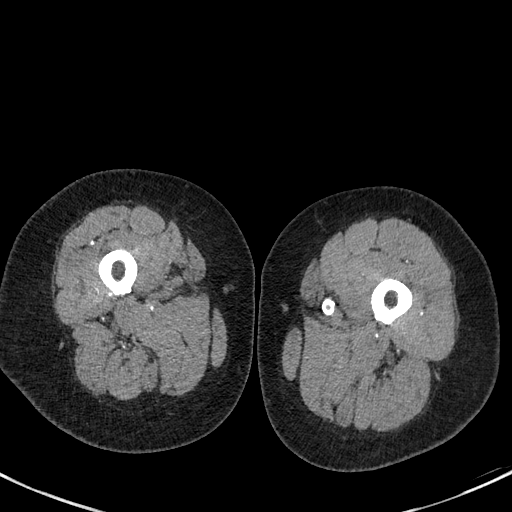
[im 261/292  lung]
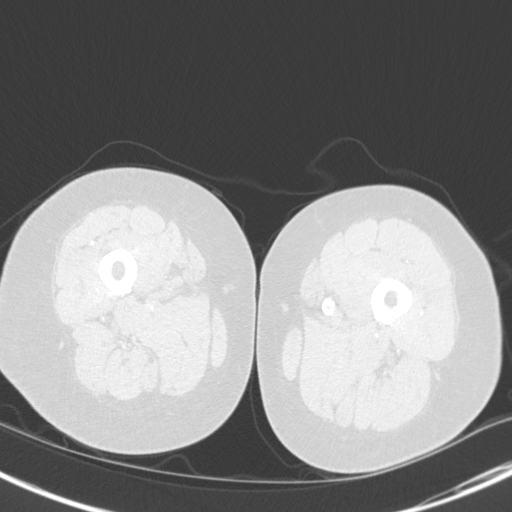
[im 276/292  lung]
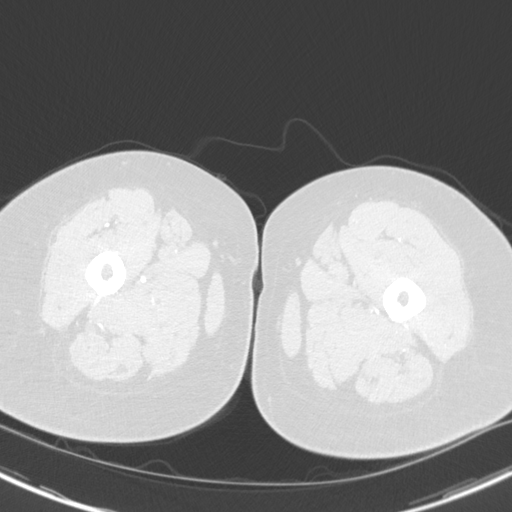

[Series 6: coronal upper · coronal · 0.69mm/px · 1 of 132 slices shown, 2 images]
[im 66/132  soft-tissue]
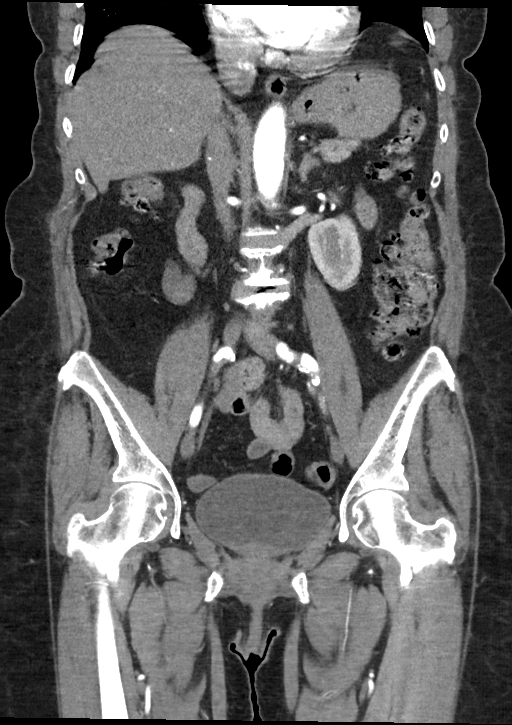
[im 66/132  bone]
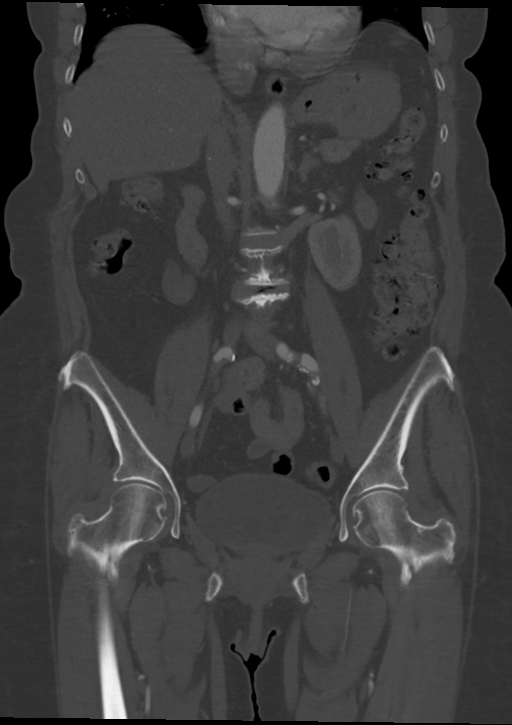

[11 of 46 positions shown; findings below may reference images not displayed]

FINDINGS: VASCULAR

Aorta: Moderate calcified plaque in the infrarenal segment. No
aneurysm, dissection, or stenosis.

Celiac: Patent without evidence of aneurysm, dissection, vasculitis
or significant stenosis.

SMA: Patent without evidence of aneurysm, dissection, vasculitis or
significant stenosis. Replaced right hepatic arterial supply, an
anatomic variant.

Renals: Both renal arteries are patent without evidence of aneurysm,
dissection, vasculitis, fibromuscular dysplasia or significant
stenosis.

IMA: Patent without evidence of aneurysm, dissection, vasculitis or
significant stenosis.

RIGHT Lower Extremity

Inflow: Common iliac atheromatous without stenosis, ectatic up to
1.2 cm distally. Atheromatous patent internal iliac. External iliac
unremarkable.

Outflow: Eccentric nonocclusive plaque in the common femoral. Deep
femoral branches patent. Origin occlusion of the SFA extends through
its length. Collateral reconstitution of popliteal proximally,
atheromatous but patent across the knee.

Runoff: Proximally atheromatous but apparently contiguous
three-vessel tibial runoff.

LEFT Lower Extremity

Inflow: Nonocclusive atheromatous plaque through the common iliac
into the internal iliac and proximal external iliac. No aneurysm.

Outflow: Nonocclusive eccentric plaque through the common femoral
artery and in the proximal deep femoral artery, which remain patent.
Occlusion of the SFA shortly beyond its origin, extending through
the length of the SFA and through the proximal popliteal artery
despite presence of arterial stent. Collateral reconstitution of the
popliteal native popliteal artery just above the knee, patent
distally.

Runoff: Anterior tibial artery occludes distal calf. Peroneal artery
occludes above the ankle. Diminutive posterior tibial artery crosses
the ankle as primary blood supply to the foot.

Veins: No obvious venous abnormality within the limitations of this
arterial phase study. Retroaortic left renal vein, anatomic variant.

Review of the MIP images confirms the above findings.

NON-VASCULAR

Lower chest: No pleural or pericardial effusion.

Hepatobiliary: Scattered small probable cysts, present since
01/23/2011. No biliary ductal dilatation. Gallbladder unremarkable.

Pancreas: Unremarkable. No pancreatic ductal dilatation or
surrounding inflammatory changes.

Spleen: Normal in size without focal abnormality.

Adrenals/Urinary Tract: Adrenals unremarkable.

Kidneys enhance normally, without hydronephrosis.

Urinary bladder physiologically distended.

Stomach/Bowel: Stomach is incompletely distended. Small bowel
decompressed. Appendix not visualized. The colon is nondilated with
multiple descending and sigmoid diverticula; no adjacent
inflammatory/edematous change or abscess.

Lymphatic: No abdominal or pelvic adenopathy.

Reproductive: Status post hysterectomy. No adnexal masses.

Other: 6.6 cm mesenteric lipoma as before. No ascites. No free air.

Musculoskeletal: Multilevel lumbar spondylitic change. Bilateral hip
DJD. No fracture or worrisome bone lesion.
IMPRESSION: VASCULAR

1. Aortoiliac atherosclerosis (41G4R-170.0) without significant
stenosis
2. Long segment occlusion of the LEFT SFA and proximal popliteal
artery (including stents).
3. Collateral reconstitution of the native LEFT popliteal artery
just above the knee, with posterior tibial runoff.
4. Long segment occlusion of the RIGHT SFA with reconstitution of
popliteal artery proximally, atheromatous but patent three-vessel
tibial runoff.

NON-VASCULAR

1. No acute findings.
2. Descending and sigmoid diverticulosis.
3. Multilevel lumbar spondylitic change and bilateral hip DJD.

## 2021-12-07 ENCOUNTER — Encounter (INDEPENDENT_AMBULATORY_CARE_PROVIDER_SITE_OTHER): Payer: Self-pay | Admitting: Vascular Surgery

## 2022-02-13 ENCOUNTER — Encounter (INDEPENDENT_AMBULATORY_CARE_PROVIDER_SITE_OTHER): Payer: Medicare HMO

## 2022-02-13 ENCOUNTER — Ambulatory Visit (INDEPENDENT_AMBULATORY_CARE_PROVIDER_SITE_OTHER): Payer: Medicare HMO | Admitting: Vascular Surgery

## 2022-02-27 ENCOUNTER — Other Ambulatory Visit: Payer: Self-pay

## 2022-02-27 ENCOUNTER — Ambulatory Visit (INDEPENDENT_AMBULATORY_CARE_PROVIDER_SITE_OTHER): Payer: Medicare HMO

## 2022-02-27 ENCOUNTER — Ambulatory Visit (INDEPENDENT_AMBULATORY_CARE_PROVIDER_SITE_OTHER): Payer: Medicare HMO | Admitting: Vascular Surgery

## 2022-02-27 ENCOUNTER — Encounter (INDEPENDENT_AMBULATORY_CARE_PROVIDER_SITE_OTHER): Payer: Self-pay | Admitting: Vascular Surgery

## 2022-02-27 VITALS — BP 156/79 | HR 66 | Resp 16 | Ht 64.0 in | Wt 156.8 lb

## 2022-02-27 DIAGNOSIS — E785 Hyperlipidemia, unspecified: Secondary | ICD-10-CM

## 2022-02-27 DIAGNOSIS — I10 Essential (primary) hypertension: Secondary | ICD-10-CM | POA: Diagnosis not present

## 2022-02-27 DIAGNOSIS — I739 Peripheral vascular disease, unspecified: Secondary | ICD-10-CM

## 2022-02-27 DIAGNOSIS — I70222 Atherosclerosis of native arteries of extremities with rest pain, left leg: Secondary | ICD-10-CM | POA: Diagnosis not present

## 2022-02-27 NOTE — Assessment & Plan Note (Signed)
ABIs today are down a little on the right to 0.65 and stable and normal on the left and 0.99 although the digital pressures remain good at 87 on the right and 94 on the left.  Not really having much in the way of symptoms.  No role for intervention.  Continue current medical regimen.  Recheck in 1 year ?

## 2022-02-27 NOTE — Progress Notes (Signed)
? ? ?MRN : [id number] ? ?Lindsay [name] is a 81 y.o. ([birth year]) female who presents with chief complaint of  ?Chief Complaint  ?Patient presents with  ? Follow-up  ?  ultrasound  ?. ? ?History of Present Illness: Patient returns today in follow up of her PAD.  She is doing well today.  More neuropathic pain in her toes and sometimes sciatic pain going down her left leg, but no significant lifestyle limiting lower extremity claudication, rest pain, or ulceration. ABIs today are down a little on the right to 0.65 and stable and normal on the left and 0.99 although the digital pressures remain good at 87 on the right and 94 on the left ? ?Current Outpatient Medications  ?Medication Sig Dispense Refill  ? acetaminophen (TYLENOL) 500 MG tablet Take 1,000 mg by mouth daily.    ? amLODipine (NORVASC) 10 MG tablet Take 10 mg by mouth daily.    ? atorvastatin (LIPITOR) 10 MG tablet Take 1 tablet by mouth daily.    ? bisoprolol-hydrochlorothiazide (ZIAC) 10-6.25 MG tablet TAKE 1 TABLET BY MOUTH DAILY (Patient taking differently: Take 1 tablet by mouth daily.) 30 tablet 0  ? ELIQUIS 5 MG TABS tablet TAKE 1 TABLET BY MOUTH TWICE A DAY 60 tablet 5  ? gabapentin (NEURONTIN) 300 MG capsule Take 1 capsule (300 mg total) by mouth 2 (two) times daily. 60 capsule 1  ? levothyroxine (SYNTHROID, LEVOTHROID) 50 MCG tablet TAKE ONE (1) TABLET BY MOUTH EVERY DAY (Patient taking differently: Take 50 mcg by mouth daily.) 90 tablet 0  ? losartan (COZAAR) 100 MG tablet TAKE ONE (1) TABLET BY MOUTH EVERY DAY (Patient taking differently: Take 100 mg by mouth daily.) 90 tablet 3  ? Multiple Vitamins-Minerals (MULTI COMPLETE PO) Take 1 tablet by mouth daily.     ? pantoprazole (PROTONIX) 40 MG tablet TAKE ONE (1) TABLET EACH DAY (Patient taking differently: Take 40 mg by mouth daily.) 90 tablet 3  ? DiphenhydrAMINE HCl (ALKA-SELTZER PLUS ALLERGY PO) Take 1 tablet by mouth daily as needed (allergies). (Patient not taking: Reported on 02/27/2022)     ? nicotine (NICODERM CQ - DOSED IN MG/24 HOURS) 21 mg/24hr patch Place 1 patch (21 mg total) onto the skin daily. (Patient not taking: Reported on 02/27/2022) 28 patch 0  ? oxyCODONE (OXY IR/ROXICODONE) 5 MG immediate release tablet One to Two Tabs Every Six Hours As Needed For Pain (Patient not taking: Reported on 02/27/2022) 50 tablet 0  ? predniSONE (DELTASONE) 20 MG tablet Take 20 mg by mouth daily. (Patient not taking: Reported on 08/15/2021)    ? ?No current facility-administered medications for this visit.  ? ? ?Past Medical History:  ?Diagnosis Date  ? Anemia   ? Anxiety   ? Arthritis   ? osteo, back  ? Depression   ? Diverticulosis   ? GERD (gastroesophageal reflux disease)   ? Hematochezia   ? Hypertension   ? controlled on meds  ? Hypothyroidism   ? PAD (peripheral artery disease) ([facility])   ? ? ?Past Surgical History:  ?Procedure Laterality Date  ? ABDOMINAL HYSTERECTOMY    ? CARDIAC CATHETERIZATION  1995  ? normal, Dr Clayborn Bigness  ? COLONOSCOPY  12/29/08  ? COLONOSCOPY N/A 04/18/2015  ? Procedure: COLONOSCOPY;  Surgeon: Lucilla Lame, MD;  Location: [facility];  Service: Gastroenterology;  Laterality: N/A;  cecum time- 0957  ? FOOT SURGERY Right   ? KNEE ARTHROSCOPY Right 10/05/2015  ? Procedure: ARTHROSCOPY KNEE, partial lateral menisectomy;  Surgeon: Earnestine Leys, MD;  Location: [facility];  Service: Orthopedics;  Laterality: Right;  ? LOWER EXTREMITY ANGIOGRAPHY Left 02/27/2018  ? Procedure: LOWER EXTREMITY ANGIOGRAPHY;  Surgeon: Algernon Huxley, MD;  Location: [facility];  Service: Cardiovascular;  Laterality: Left;  ? LOWER EXTREMITY ANGIOGRAPHY Left 10/15/2018  ? Procedure: LOWER EXTREMITY ANGIOGRAPHY;  Surgeon: Algernon Huxley, MD;  Location: [facility];  Service: Cardiovascular;  Laterality: Left;  ? LOWER EXTREMITY ANGIOGRAPHY Left 12/29/2018  ? Procedure: LOWER EXTREMITY ANGIOGRAPHY;  Surgeon: Algernon Huxley, MD;  Location: [place] [facility];  Service: Cardiovascular;  Laterality:  Left;  ? LOWER EXTREMITY ANGIOGRAPHY Left 12/23/2019  ? Procedure: LOWER EXTREMITY ANGIOGRAPHY;  Surgeon: Algernon Huxley, MD;  Location: [place] [facility];  Service: Cardiovascular;  Laterality: Left;  ? LOWER EXTREMITY ANGIOGRAPHY Left 12/24/2019  ? Procedure: Lower Extremity Angiography;  Surgeon: Algernon Huxley, MD;  Location: [facility] [facility];  Service: Cardiovascular;  Laterality: Left;  ? POLYPECTOMY  04/18/2015  ? Procedure: POLYPECTOMY INTESTINAL;  Surgeon: Lucilla Lame, MD;  Location: [facility];  Service: Gastroenterology;;  ? WRIST SURGERY Right   ? ? ? ?Social History  ? ?Tobacco Use  ? Smoking status: Light Smoker  ?  Packs/day: 0.50  ?  Years: 30.00  ?  Pack years: 15.00  ?  Types: Cigarettes  ?  Last attempt to quit: 10/14/2018  ?  Years since quitting: 3.3  ? Smokeless tobacco: Never  ?Vaping Use  ? Vaping Use: Never used  ?Substance Use Topics  ? Alcohol use: Yes  ?  Alcohol/week: 0.0 standard drinks  ?  Comment: occassionally/ whiskey on weekend; total of 4 drinks per week  ? Drug use: No  ? ? ? ? ?Family History  ?Problem Relation Age of Onset  ? Diabetes Mother   ? Hypertension Mother   ? Diabetes Sister   ? Hypertension Sister   ? ? ? ?Allergies  ?Allergen Reactions  ? Aspirin Other (See Comments)  ?  GI BLEED  ? Nsaids Other (See Comments)  ?  GI BLEED  ? Penicillins Itching, Rash and Other (See Comments)  ?  Has patient had a PCN reaction causing immediate rash, facial/tongue/throat swelling, SOB or lightheadedness with hypotension: Yes ?Has patient had a PCN reaction causing severe rash involving mucus membranes or skin necrosis: No ?Has patient had a PCN reaction that required hospitalization No ?Has patient had a PCN reaction occurring within the last 10 years: No ?If all of the above answers are "NO", then may proceed with Cephalosporin use. ?  ? Pollen Extract Other (See Comments)  ?  Congestion, Eye Irritation  ? ? ?REVIEW OF SYSTEMS (Negative unless checked) ?  ?Constitutional:  '[]'$ Weight loss  '[]'$ Fever  '[]'$ Chills ?Cardiac: '[]'$ Chest pain   '[]'$ Chest pressure   '[]'$ Palpitations   '[]'$ Shortness of breath when laying flat   '[]'$ Shortness of breath at rest   '[]'$ Shortness of breath with exertion. ?Vascular:  '[x]'$ Pain in legs with walking   '[]'$ Pain in legs at rest   '[]'$ Pain in legs when laying flat   '[x]'$ Claudication   '[]'$ Pain in feet when walking  '[]'$ Pain in feet at rest  '[]'$ Pain in feet when laying flat   '[]'$ History of DVT   '[]'$ Phlebitis   '[]'$ Swelling in legs   '[]'$ Varicose veins   '[]'$ Non-healing ulcers ?Pulmonary:   '[]'$ Uses home oxygen   '[]'$ Productive cough   '[]'$ Hemoptysis   '[]'$ Wheeze  '[]'$ COPD   '[]'$ Asthma ?Neurologic:  '[]'$ Dizziness  '[]'$   Blackouts   '[]'$ Seizures   '[]'$ History of stroke   '[]'$ History of TIA  '[]'$ Aphasia   '[]'$ Temporary blindness   '[]'$ Dysphagia   '[]'$ Weakness or numbness in arms   '[]'$ Weakness or numbness in legs ?Musculoskeletal:  '[x]'$ Arthritis   '[]'$ Joint swelling   '[x]'$ Joint pain   '[]'$ Low back pain ?Hematologic:  '[]'$ Easy bruising  '[]'$ Easy bleeding   '[]'$ Hypercoagulable state   '[x]'$ Anemic   ?Gastrointestinal:  '[]'$ Blood in stool   '[]'$ Vomiting blood  '[x]'$ Gastroesophageal reflux/heartburn   '[]'$ Abdominal pain ?Genitourinary:  '[]'$ Chronic kidney disease   '[]'$ Difficult urination  '[]'$ Frequent urination  '[]'$ Burning with urination   '[]'$ Hematuria ?Skin:  '[]'$ Rashes   '[]'$ Ulcers   '[]'$ Wounds ?Psychological:  '[x]'$ History of anxiety   '[]'$  History of major depression. ? ?Physical Examination ? ?BP (!) 156/79 (BP Location: Right Arm)   Pulse 66   Resp 16   Ht '5\' 4"'$  (1.626 m)   Wt 156 lb 12.8 oz (71.1 kg)   BMI 26.91 kg/m?  ?Gen:  WD/WN, NAD ?Head: [place]/AT, No temporalis wasting. ?Ear/Nose/Throat: Hearing grossly intact, nares w/o erythema or drainage ?Eyes: Conjunctiva clear. Sclera non-icteric ?Neck: Supple.  Trachea midline ?Pulmonary:  Good air movement, no use of accessory muscles.  ?Cardiac: RRR, no JVD ?Vascular:  ?Vessel Right Left  ?Radial Palpable Palpable  ?    ?    ?    ?    ?    ?    ?PT 1+ Palpable 1+ Palpable  ?DP 1+ Palpable 2+ Palpable   ? ?Gastrointestinal: soft, non-tender/non-distended. No guarding/reflex.  ?Musculoskeletal: M/S 5/5 throughout.  No deformity or atrophy. Trace LE edema. ?Neurologic: Sensation grossly intact in extremities.  Symmetrical.

## 2022-03-06 ENCOUNTER — Other Ambulatory Visit (INDEPENDENT_AMBULATORY_CARE_PROVIDER_SITE_OTHER): Payer: Self-pay | Admitting: Vascular Surgery

## 2022-09-26 ENCOUNTER — Encounter: Payer: Self-pay | Admitting: Ophthalmology

## 2022-10-01 ENCOUNTER — Encounter (INDEPENDENT_AMBULATORY_CARE_PROVIDER_SITE_OTHER): Payer: Self-pay

## 2022-10-01 NOTE — Discharge Instructions (Signed)

## 2022-10-03 ENCOUNTER — Encounter
Admission: RE | Disposition: A | Discharge status: Home / Self Care | Payer: Self-pay | Source: Home / Self Care | Attending: Ophthalmology

## 2022-10-03 ENCOUNTER — Other Ambulatory Visit: Payer: Self-pay

## 2022-10-03 ENCOUNTER — Encounter: Payer: Self-pay | Admitting: Ophthalmology

## 2022-10-03 ENCOUNTER — Ambulatory Visit
Admission: RE | Admit: 2022-10-03 | Discharge: 2022-10-03 | Disposition: A | Discharge status: Home / Self Care | Payer: Medicare HMO | Attending: Ophthalmology | Admitting: Ophthalmology

## 2022-10-03 ENCOUNTER — Ambulatory Visit: Payer: Medicare HMO | Admitting: Anesthesiology

## 2022-10-03 DIAGNOSIS — Z86718 Personal history of other venous thrombosis and embolism: Secondary | ICD-10-CM | POA: Insufficient documentation

## 2022-10-03 DIAGNOSIS — H5703 Miosis: Secondary | ICD-10-CM | POA: Diagnosis not present

## 2022-10-03 DIAGNOSIS — H2511 Age-related nuclear cataract, right eye: Secondary | ICD-10-CM | POA: Diagnosis present

## 2022-10-03 DIAGNOSIS — I10 Essential (primary) hypertension: Secondary | ICD-10-CM | POA: Diagnosis not present

## 2022-10-03 DIAGNOSIS — F17210 Nicotine dependence, cigarettes, uncomplicated: Secondary | ICD-10-CM | POA: Diagnosis not present

## 2022-10-03 DIAGNOSIS — I739 Peripheral vascular disease, unspecified: Secondary | ICD-10-CM | POA: Insufficient documentation

## 2022-10-03 HISTORY — PX: CATARACT EXTRACTION W/PHACO: SHX586

## 2022-10-03 HISTORY — DX: Presence of dental prosthetic device (complete) (partial): Z97.2

## 2022-10-03 SURGERY — PHACOEMULSIFICATION, CATARACT, WITH IOL INSERTION
Anesthesia: Monitor Anesthesia Care | Site: Eye | Laterality: Right

## 2022-10-03 MED ORDER — SIGHTPATH DOSE#1 NA HYALUR & NA CHOND-NA HYALUR IO KIT
PACK | INTRAOCULAR | Status: DC | PRN
  Administered 2022-10-03: 1 via OPHTHALMIC

## 2022-10-03 MED ORDER — FENTANYL CITRATE PF 50 MCG/ML IJ SOSY: 25.0000 ug | PREFILLED_SYRINGE | INTRAMUSCULAR | Status: DC | PRN

## 2022-10-03 MED ORDER — ONDANSETRON HCL 4 MG/2ML IJ SOLN: 4.0000 mg | Freq: Once | INTRAMUSCULAR | Status: DC | PRN

## 2022-10-03 MED ORDER — SIGHTPATH DOSE#1 BSS IO SOLN
INTRAOCULAR | Status: DC | PRN
  Administered 2022-10-03: 1 mL via INTRAMUSCULAR

## 2022-10-03 MED ORDER — LACTATED RINGERS IV SOLN: INTRAVENOUS | Status: DC

## 2022-10-03 MED ORDER — SIGHTPATH DOSE#1 BSS IO SOLN
INTRAOCULAR | Status: DC | PRN
  Administered 2022-10-03: 67 mL via OPHTHALMIC

## 2022-10-03 MED ORDER — MOXIFLOXACIN HCL 0.5 % OP SOLN
OPHTHALMIC | Status: DC | PRN
  Administered 2022-10-03: 0.2 mL via OPHTHALMIC

## 2022-10-03 MED ORDER — FENTANYL CITRATE (PF) 100 MCG/2ML IJ SOLN
INTRAMUSCULAR | Status: DC | PRN
  Administered 2022-10-03: 50 ug via INTRAVENOUS

## 2022-10-03 MED ORDER — ARMC OPHTHALMIC DILATING DROPS
1.0000 | OPHTHALMIC | Status: DC | PRN
  Administered 2022-10-03 (×2): 1 via OPHTHALMIC

## 2022-10-03 MED ORDER — BRIMONIDINE TARTRATE-TIMOLOL 0.2-0.5 % OP SOLN
OPHTHALMIC | Status: DC | PRN
  Administered 2022-10-03: 1 [drp] via OPHTHALMIC

## 2022-10-03 MED ORDER — TETRACAINE HCL 0.5 % OP SOLN
1.0000 [drp] | OPHTHALMIC | Status: DC | PRN
  Administered 2022-10-03 (×4): 1 [drp] via OPHTHALMIC

## 2022-10-03 MED ORDER — SIGHTPATH DOSE#1 BSS IO SOLN
INTRAOCULAR | Status: DC | PRN
  Administered 2022-10-03: 15 mL

## 2022-10-03 MED ORDER — MIDAZOLAM HCL 2 MG/2ML IJ SOLN
INTRAMUSCULAR | Status: DC | PRN
  Administered 2022-10-03: 1 mg via INTRAVENOUS

## 2022-10-03 SURGICAL SUPPLY — 11 items
CATARACT SUITE SIGHTPATH (MISCELLANEOUS) ×1 IMPLANT
FEE CATARACT SUITE SIGHTPATH (MISCELLANEOUS) ×1 IMPLANT
GLOVE SRG 8 PF TXTR STRL LF DI (GLOVE) ×1 IMPLANT
GLOVE SURG ENC TEXT LTX SZ7.5 (GLOVE) ×1 IMPLANT
GLOVE SURG UNDER POLY LF SZ8 (GLOVE) ×1
LENS IOL TECNIS EYHANCE 22.0 (Intraocular Lens) IMPLANT
NDL FILTER BLUNT 18X1 1/2 (NEEDLE) ×1 IMPLANT
NEEDLE FILTER BLUNT 18X1 1/2 (NEEDLE) ×1 IMPLANT
RING MALYGIN 7.0 (MISCELLANEOUS) IMPLANT
SYR 3ML LL SCALE MARK (SYRINGE) ×1 IMPLANT
WATER STERILE IRR 250ML POUR (IV SOLUTION) ×1 IMPLANT

## 2022-10-03 NOTE — Anesthesia Preprocedure Evaluation (Signed)
Anesthesia Evaluation  Patient identified by MRN, date of birth, ID band Patient awake    Reviewed: Allergy & Precautions, H&P , NPO status , Patient's Chart, lab work & pertinent test results, reviewed documented beta blocker date and time   Airway Mallampati: II  TM Distance: >3 FB Neck ROM: full    Dental no notable dental hx. (+) Teeth Intact   Pulmonary neg pulmonary ROS, Current Smoker,    Pulmonary exam normal breath sounds clear to auscultation       Cardiovascular Exercise Tolerance: Poor hypertension, On Medications + angina with exertion + Peripheral Vascular Disease   Rhythm:regular Rate:Normal     Neuro/Psych PSYCHIATRIC DISORDERS Anxiety Depression negative neurological ROS     GI/Hepatic Neg liver ROS, GERD  Medicated,  Endo/Other  diabetes, Well ControlledHypothyroidism   Renal/GU      Musculoskeletal   Abdominal   Peds  Hematology  (+) Blood dyscrasia, anemia ,   Anesthesia Other Findings   Reproductive/Obstetrics negative OB ROS                             Anesthesia Physical Anesthesia Plan  ASA: 3  Anesthesia Plan: MAC   Post-op Pain Management:    Induction:   PONV Risk Score and Plan:   Airway Management Planned:   Additional Equipment:   Intra-op Plan:   Post-operative Plan:   Informed Consent: I have reviewed the patients History and Physical, chart, labs and discussed the procedure including the risks, benefits and alternatives for the proposed anesthesia with the patient or authorized representative who has indicated his/her understanding and acceptance.       Plan Discussed with: CRNA  Anesthesia Plan Comments:         Anesthesia Quick Evaluation

## 2022-10-03 NOTE — H&P (Signed)
[facility]   Primary Care Physician:  Dion Body, MD Ophthalmologist: Dr. Leandrew Koyanagi  Pre-Procedure History & Physical: HPI:  Lindsay [name] is a 81 y.o. female here for ophthalmic surgery.   Past Medical History:  Diagnosis Date   Anemia    Anxiety    Arthritis    osteo, back   Depression    Diverticulosis    DVT (deep venous thrombosis) ([place]) 12/2019   left leg   GERD (gastroesophageal reflux disease)    Hematochezia    Hypertension    controlled on meds   Hypothyroidism    PAD (peripheral artery disease) ([facility])    Wears dentures    Full upper    Past Surgical History:  Procedure Laterality Date   ABDOMINAL HYSTERECTOMY     CARDIAC CATHETERIZATION  1995   normal, Dr Clayborn Bigness   COLONOSCOPY  12/29/08   COLONOSCOPY N/A 04/18/2015   Procedure: COLONOSCOPY;  Surgeon: Lucilla Lame, MD;  Location: [facility];  Service: Gastroenterology;  Laterality: N/A;  cecum time- 0957   FOOT SURGERY Right    KNEE ARTHROSCOPY Right 10/05/2015   Procedure: ARTHROSCOPY KNEE, partial lateral menisectomy;  Surgeon: Earnestine Leys, MD;  Location: [facility];  Service: Orthopedics;  Laterality: Right;   LOWER EXTREMITY ANGIOGRAPHY Left 02/27/2018   Procedure: LOWER EXTREMITY ANGIOGRAPHY;  Surgeon: Algernon Huxley, MD;  Location: [place] [facility];  Service: Cardiovascular;  Laterality: Left;   LOWER EXTREMITY ANGIOGRAPHY Left 10/15/2018   Procedure: LOWER EXTREMITY ANGIOGRAPHY;  Surgeon: Algernon Huxley, MD;  Location: [place] [facility];  Service: Cardiovascular;  Laterality: Left;   LOWER EXTREMITY ANGIOGRAPHY Left 12/29/2018   Procedure: LOWER EXTREMITY ANGIOGRAPHY;  Surgeon: Algernon Huxley, MD;  Location: [place] [facility];  Service: Cardiovascular;  Laterality: Left;   LOWER EXTREMITY ANGIOGRAPHY Left 12/23/2019   Procedure: LOWER EXTREMITY ANGIOGRAPHY;  Surgeon: Algernon Huxley, MD;  Location: [facility];  Service: Cardiovascular;  Laterality: Left;    LOWER EXTREMITY ANGIOGRAPHY Left 12/24/2019   Procedure: Lower Extremity Angiography;  Surgeon: Algernon Huxley, MD;  Location: [facility];  Service: Cardiovascular;  Laterality: Left;   POLYPECTOMY  04/18/2015   Procedure: POLYPECTOMY INTESTINAL;  Surgeon: Lucilla Lame, MD;  Location: [facility];  Service: Gastroenterology;;   WRIST SURGERY Right     Prior to Admission medications   Medication Sig Start Date End Date Taking? Authorizing Provider  acetaminophen (TYLENOL) 500 MG tablet Take 1,000 mg by mouth daily.   Yes [provider]  amLODipine (NORVASC) 10 MG tablet Take 10 mg by mouth daily. 01/18/21  Yes [provider]  atorvastatin (LIPITOR) 10 MG tablet Take 1 tablet by mouth daily. 10/12/21  Yes [provider]  bisoprolol-hydrochlorothiazide (ZIAC) 10-6.25 MG tablet TAKE 1 TABLET BY MOUTH DAILY Patient taking differently: Take 1 tablet by mouth daily. 08/25/18  Yes Arnett, Yvetta Coder, FNP  DiphenhydrAMINE HCl (ALKA-SELTZER PLUS ALLERGY PO) Take 1 tablet by mouth daily as needed (allergies).   Yes [provider]  ELIQUIS 5 MG TABS tablet TAKE 1 TABLET BY MOUTH TWICE A DAY 03/07/22  Yes Dew, Erskine Squibb, MD  levothyroxine (SYNTHROID, LEVOTHROID) 50 MCG tablet TAKE ONE (1) TABLET BY MOUTH EVERY DAY Patient taking differently: Take 50 mcg by mouth daily. 05/23/18  Yes Arnett, Yvetta Coder, FNP  losartan (COZAAR) 100 MG tablet TAKE ONE (1) TABLET BY MOUTH EVERY DAY Patient taking differently: Take 100 mg by mouth daily. 07/17/17  Yes Burnard Hawthorne, FNP  Multiple Vitamins-Minerals (MULTI COMPLETE PO) Take 1 tablet by mouth daily.    Yes [provider]  pantoprazole (PROTONIX) 40 MG tablet TAKE ONE (1) TABLET EACH DAY Patient taking differently: Take 20 mg by mouth daily. 07/17/17  Yes Arnett, Yvetta Coder, FNP  nicotine (NICODERM CQ - DOSED IN MG/24 HOURS) 21 mg/24hr patch Place 1 patch (21 mg total) onto the skin daily. Patient not  taking: Reported on 02/27/2022 12/25/19   Stegmayer, Joelene Millin A, PA-C    Allergies as of 09/10/2022 - Review Complete 02/27/2022  Allergen Reaction Noted   Aspirin Other (See Comments) 09/22/2015   Nsaids Other (See Comments) 09/22/2015   Penicillins Itching, Rash, and Other (See Comments) 04/14/2015   Pollen extract Other (See Comments)     Family History  Problem Relation Age of Onset   Diabetes Mother    Hypertension Mother    Diabetes Sister    Hypertension Sister     Social History   Socioeconomic History   Marital status: Widowed    Spouse name: Not on file   Number of children: Not on file   Years of education: Not on file   Highest education level: Not on file  Occupational History   Not on file  Tobacco Use   Smoking status: Light Smoker    Packs/day: 0.50    Years: 30.00    Total pack years: 15.00    Types: Cigarettes    Last attempt to quit: 10/14/2018    Years since quitting: 3.9   Smokeless tobacco: Never  Vaping Use   Vaping Use: Never used  Substance and Sexual Activity   Alcohol use: Yes    Alcohol/week: 0.0 standard drinks of alcohol    Comment: occassionally/ whiskey on weekend; total of 4 drinks per week   Drug use: No   Sexual activity: Not Currently  Other Topics Concern   Not on file  Social History Narrative   From [place].   [place]- 20 hours week.    Husband in nursing home in [place].    1 child; 3 step children   Lives by herself, independent         Social Determinants of Health   Financial Resource Strain: Not on file  Food Insecurity: Not on file  Transportation Needs: Not on file  Physical Activity: Not on file  Stress: Not on file  Social Connections: Not on file  Intimate Partner Violence: Not on file    Review of Systems: See HPI, otherwise negative ROS  Physical Exam: BP (!) 148/85   Pulse 60   Temp 98.2 F (36.8 C)   Ht '5\' 5"'$  (1.651 m)   Wt 69.4 kg   SpO2 100%   BMI 25.46  kg/m  General:   Alert,  pleasant and cooperative in NAD Head:  Normocephalic and atraumatic. Lungs:  Clear to auscultation.    Heart:  Regular rate and rhythm.   Impression/Plan: Lindsay [name] is here for ophthalmic surgery.  Risks, benefits, limitations, and alternatives regarding ophthalmic surgery have been reviewed with the patient.  Questions have been answered.  All parties agreeable.   Leandrew Koyanagi, MD  10/03/2022, 9:23 AM

## 2022-10-03 NOTE — Transfer of Care (Signed)
Immediate Anesthesia Transfer of Care Note  Patient: Lindsay [name]  Procedure(s) Performed: CATARACT EXTRACTION PHACO AND INTRAOCULAR LENS PLACEMENT (IOC) RIGHT 9.96 01:10.5 (Right: Eye)  Patient Location: PACU  Anesthesia Type: MAC  Level of Consciousness: awake, alert  and patient cooperative  Airway and Oxygen Therapy: Patient Spontanous Breathing and Patient connected to supplemental oxygen  Post-op Assessment: Post-op Vital signs reviewed, Patient's Cardiovascular Status Stable, Respiratory Function Stable, Patent Airway and No signs of Nausea or vomiting  Post-op Vital Signs: Reviewed and stable  Complications: No notable events documented.

## 2022-10-03 NOTE — Op Note (Signed)
OPERATIVE NOTE  Lindsay [name] [id number] 10/03/2022   PREOPERATIVE DIAGNOSIS:    Nuclear Sclerotic Cataract Right eye with miotic pupil.        H25.11  POSTOPERATIVE DIAGNOSIS: Nuclear Sclerotic Cataract Right eye with miotic pupil.          PROCEDURE:  Phacoemusification with posterior chamber intraocular lens placement of the right eye which required pupil stretching with the Malyugin pupil expansion device. Ultrasound time: Procedure(s): CATARACT EXTRACTION PHACO AND INTRAOCULAR LENS PLACEMENT (IOC) RIGHT 9.96 01:10.5 (Right)  LENS:   Implant Name Type Inv. Item Serial No. Manufacturer Lot No. LRB No. Used Action  LENS IOL TECNIS EYHANCE 22.0 - [id number] Intraocular Lens LENS IOL TECNIS EYHANCE 22.0 [id number] [employer]  Right 1 Implanted       SURGEON:  Wyonia Hough, MD   ANESTHESIA:  Topical with tetracaine drops and 2% Xylocaine jelly, augmented with 1% preservative-free intracameral lidocaine.   COMPLICATIONS:  None.   DESCRIPTION OF PROCEDURE:  The patient was identified in the holding room and transported to the operating room and placed in the supine position under the operating microscope. Theright eye was identified as the operative eye and it was prepped and draped in the usual sterile ophthalmic fashion.   A 1 millimeter clear-corneal paracentesis was made at the 12:00 position.  0.5 ml of preservative-free 1% lidocaine was injected into the anterior chamber. The anterior chamber was filled with Viscoat viscoelastic.  A 2.4 millimeter keratome was used to make a near-clear corneal incision at the 9:00 position. A Malyugin pupil expander was then placed through the main incision and into the anterior chamber of the eye.  The edge of the iris was secured on the lip of the pupil expander and it was released, thereby expanding the pupil to approximately 7 millimeters for completion of the cataract surgery.  Additional Viscoat was placed in the anterior chamber.  A  cystotome and capsulorrhexis forceps were used to make a curvilinear capsulorrhexis.   Balanced salt solution was used to hydrodissect and hydrodelineate the lens nucleus.   Phacoemulsification was used in stop and chop fashion to remove the lens, nucleus and epinucleus.  The remaining cortex was aspirated using the irrigation aspiration handpiece.  Additional Provisc was placed into the eye to distend the capsular bag for lens placement.  A lens was then injected into the capsular bag.  The pupil expanding ring was removed using a Kuglen hook and insertion device. The remaining viscoelastic was aspirated from the capsular bag and the anterior chamber.  The anterior chamber was filled with balanced salt solution to inflate to a physiologic pressure.  Wounds were hydrated with balanced salt solution.  The anterior chamber was inflated to a physiologic pressure with balanced salt solution.  No wound leaks were noted. Vigamox 0.2 ml of a '1mg'$  per ml solution was injected into the anterior chamber for a dose of 0.2 mg of intracameral antibiotic at the completion of the case. Timolol and Brimonidine drops were applied to the eye.  The patient was taken to the recovery room in stable condition without complications of anesthesia or surgery.  [name] 10/03/2022, 10:31 AM

## 2022-10-03 NOTE — Anesthesia Postprocedure Evaluation (Signed)
Anesthesia Post Note  Patient: Lindsay [name]  Procedure(s) Performed: CATARACT EXTRACTION PHACO AND INTRAOCULAR LENS PLACEMENT (IOC) RIGHT 9.96 01:10.5 (Right: Eye)  Patient location during evaluation: PACU Anesthesia Type: MAC Level of consciousness: awake and alert Pain management: pain level controlled Vital Signs Assessment: post-procedure vital signs reviewed and stable Respiratory status: spontaneous breathing, nonlabored ventilation, respiratory function stable and patient connected to nasal cannula oxygen Cardiovascular status: stable and blood pressure returned to baseline Postop Assessment: no apparent nausea or vomiting Anesthetic complications: no   No notable events documented.   Last Vitals:  Vitals:   10/03/22 1032 10/03/22 1040  BP: 125/69 138/73  Pulse: (!) 56 (!) 54  Resp: 10 14  Temp: (!) 36.4 C   SpO2: 99% 99%    Last Pain:  Vitals:   10/03/22 1040  TempSrc:   PainSc: 0-No pain                 Molli Barrows

## 2022-10-04 ENCOUNTER — Encounter: Payer: Self-pay | Admitting: Ophthalmology

## 2022-10-16 NOTE — Discharge Instructions (Signed)

## 2022-10-17 ENCOUNTER — Ambulatory Visit
Admission: RE | Admit: 2022-10-17 | Discharge: 2022-10-17 | Disposition: A | Discharge status: Home / Self Care | Payer: Medicare HMO | Attending: Ophthalmology | Admitting: Ophthalmology

## 2022-10-17 ENCOUNTER — Ambulatory Visit: Payer: Medicare HMO | Admitting: General Practice

## 2022-10-17 ENCOUNTER — Encounter: Payer: Self-pay | Admitting: Ophthalmology

## 2022-10-17 ENCOUNTER — Encounter
Admission: RE | Disposition: A | Discharge status: Home / Self Care | Payer: Self-pay | Source: Home / Self Care | Attending: Ophthalmology

## 2022-10-17 DIAGNOSIS — K219 Gastro-esophageal reflux disease without esophagitis: Secondary | ICD-10-CM | POA: Insufficient documentation

## 2022-10-17 DIAGNOSIS — F17210 Nicotine dependence, cigarettes, uncomplicated: Secondary | ICD-10-CM | POA: Diagnosis not present

## 2022-10-17 DIAGNOSIS — H5703 Miosis: Secondary | ICD-10-CM | POA: Insufficient documentation

## 2022-10-17 DIAGNOSIS — H2512 Age-related nuclear cataract, left eye: Secondary | ICD-10-CM | POA: Diagnosis present

## 2022-10-17 DIAGNOSIS — Z86718 Personal history of other venous thrombosis and embolism: Secondary | ICD-10-CM | POA: Diagnosis not present

## 2022-10-17 DIAGNOSIS — I739 Peripheral vascular disease, unspecified: Secondary | ICD-10-CM | POA: Insufficient documentation

## 2022-10-17 HISTORY — PX: CATARACT EXTRACTION W/PHACO: SHX586

## 2022-10-17 SURGERY — PHACOEMULSIFICATION, CATARACT, WITH IOL INSERTION
Anesthesia: Monitor Anesthesia Care | Site: Eye | Laterality: Left

## 2022-10-17 MED ORDER — SIGHTPATH DOSE#1 NA HYALUR & NA CHOND-NA HYALUR IO KIT
PACK | INTRAOCULAR | Status: DC | PRN
  Administered 2022-10-17: 1 via OPHTHALMIC

## 2022-10-17 MED ORDER — ARMC OPHTHALMIC DILATING DROPS
1.0000 | OPHTHALMIC | Status: DC | PRN
  Administered 2022-10-17 (×3): 1 via OPHTHALMIC

## 2022-10-17 MED ORDER — SIGHTPATH DOSE#1 BSS IO SOLN
INTRAOCULAR | Status: DC | PRN
  Administered 2022-10-17: 15 mL

## 2022-10-17 MED ORDER — SIGHTPATH DOSE#1 BSS IO SOLN
INTRAOCULAR | Status: DC | PRN
  Administered 2022-10-17: 1 mL via INTRAMUSCULAR

## 2022-10-17 MED ORDER — FENTANYL CITRATE (PF) 100 MCG/2ML IJ SOLN
INTRAMUSCULAR | Status: DC | PRN
  Administered 2022-10-17 (×2): 50 ug via INTRAVENOUS

## 2022-10-17 MED ORDER — BRIMONIDINE TARTRATE-TIMOLOL 0.2-0.5 % OP SOLN
OPHTHALMIC | Status: DC | PRN
  Administered 2022-10-17: 1 [drp] via OPHTHALMIC

## 2022-10-17 MED ORDER — TETRACAINE HCL 0.5 % OP SOLN
1.0000 [drp] | OPHTHALMIC | Status: DC | PRN
  Administered 2022-10-17 (×3): 1 [drp] via OPHTHALMIC

## 2022-10-17 MED ORDER — MIDAZOLAM HCL 2 MG/2ML IJ SOLN
INTRAMUSCULAR | Status: DC | PRN
  Administered 2022-10-17 (×2): 1 mg via INTRAVENOUS

## 2022-10-17 MED ORDER — MOXIFLOXACIN HCL 0.5 % OP SOLN
OPHTHALMIC | Status: DC | PRN
  Administered 2022-10-17: .2 mL via OPHTHALMIC

## 2022-10-17 MED ORDER — SIGHTPATH DOSE#1 BSS IO SOLN
INTRAOCULAR | Status: DC | PRN
  Administered 2022-10-17: 57 mL via OPHTHALMIC

## 2022-10-17 SURGICAL SUPPLY — 11 items
CATARACT SUITE SIGHTPATH (MISCELLANEOUS) ×1 IMPLANT
FEE CATARACT SUITE SIGHTPATH (MISCELLANEOUS) ×1 IMPLANT
GLOVE SRG 8 PF TXTR STRL LF DI (GLOVE) ×1 IMPLANT
GLOVE SURG ENC TEXT LTX SZ7.5 (GLOVE) ×1 IMPLANT
GLOVE SURG UNDER POLY LF SZ8 (GLOVE) ×1
LENS IOL TECNIS EYHANCE 22.0 (Intraocular Lens) IMPLANT
NDL FILTER BLUNT 18X1 1/2 (NEEDLE) ×1 IMPLANT
NEEDLE FILTER BLUNT 18X1 1/2 (NEEDLE) ×1 IMPLANT
RING MALYGIN 7.0 (MISCELLANEOUS) IMPLANT
SYR 3ML LL SCALE MARK (SYRINGE) ×1 IMPLANT
WATER STERILE IRR 250ML POUR (IV SOLUTION) ×1 IMPLANT

## 2022-10-17 NOTE — H&P (Signed)
[facility]   Primary Care Physician:  Dion Body, MD Ophthalmologist: Dr. Leandrew Koyanagi  Pre-Procedure History & Physical: HPI:  Lindsay [name] is a 81 y.o. female here for ophthalmic surgery.   Past Medical History:  Diagnosis Date   Anemia    Anxiety    Arthritis    osteo, back   Depression    Diverticulosis    DVT (deep venous thrombosis) ([place]) 12/2019   left leg   GERD (gastroesophageal reflux disease)    Hematochezia    Hypertension    controlled on meds   Hypothyroidism    PAD (peripheral artery disease) ([facility])    Wears dentures    Full upper    Past Surgical History:  Procedure Laterality Date   ABDOMINAL HYSTERECTOMY     CARDIAC CATHETERIZATION  1995   normal, Dr Clayborn Bigness   CATARACT EXTRACTION W/PHACO Right 10/03/2022   Procedure: CATARACT EXTRACTION PHACO AND INTRAOCULAR LENS PLACEMENT (IOC) RIGHT 9.96 01:10.5;  Surgeon: Leandrew Koyanagi, MD;  Location: [place];  Service: Ophthalmology;  Laterality: Right;   COLONOSCOPY  12/29/08   COLONOSCOPY N/A 04/18/2015   Procedure: COLONOSCOPY;  Surgeon: Lucilla Lame, MD;  Location: [place];  Service: Gastroenterology;  Laterality: N/A;  cecum time- 0957   FOOT SURGERY Right    KNEE ARTHROSCOPY Right 10/05/2015   Procedure: ARTHROSCOPY KNEE, partial lateral menisectomy;  Surgeon: Earnestine Leys, MD;  Location: [facility];  Service: Orthopedics;  Laterality: Right;   LOWER EXTREMITY ANGIOGRAPHY Left 02/27/2018   Procedure: LOWER EXTREMITY ANGIOGRAPHY;  Surgeon: Algernon Huxley, MD;  Location: [place] [facility];  Service: Cardiovascular;  Laterality: Left;   LOWER EXTREMITY ANGIOGRAPHY Left 10/15/2018   Procedure: LOWER EXTREMITY ANGIOGRAPHY;  Surgeon: Algernon Huxley, MD;  Location: [place] [facility];  Service: Cardiovascular;  Laterality: Left;   LOWER EXTREMITY ANGIOGRAPHY Left 12/29/2018   Procedure: LOWER EXTREMITY ANGIOGRAPHY;  Surgeon: Algernon Huxley, MD;  Location: [place] [facility];  Service: Cardiovascular;  Laterality: Left;   LOWER EXTREMITY ANGIOGRAPHY Left 12/23/2019   Procedure: LOWER EXTREMITY ANGIOGRAPHY;  Surgeon: Algernon Huxley, MD;  Location: [place] [facility];  Service: Cardiovascular;  Laterality: Left;   LOWER EXTREMITY ANGIOGRAPHY Left 12/24/2019   Procedure: Lower Extremity Angiography;  Surgeon: Algernon Huxley, MD;  Location: [place] [facility];  Service: Cardiovascular;  Laterality: Left;   POLYPECTOMY  04/18/2015   Procedure: POLYPECTOMY INTESTINAL;  Surgeon: Lucilla Lame, MD;  Location: [place];  Service: Gastroenterology;;   WRIST SURGERY Right     Prior to Admission medications   Medication Sig Start Date End Date Taking? Authorizing Provider  acetaminophen (TYLENOL) 500 MG tablet Take 1,000 mg by mouth daily.   Yes [provider]  amLODipine (NORVASC) 10 MG tablet Take 10 mg by mouth daily. 01/18/21  Yes [provider]  atorvastatin (LIPITOR) 10 MG tablet Take 1 tablet by mouth daily. 10/12/21  Yes [provider]  bisoprolol-hydrochlorothiazide (ZIAC) 10-6.25 MG tablet TAKE 1 TABLET BY MOUTH DAILY Patient taking differently: Take 1 tablet by mouth daily. 08/25/18  Yes Arnett, Yvetta Coder, FNP  DiphenhydrAMINE HCl (ALKA-SELTZER PLUS ALLERGY PO) Take 1 tablet by mouth daily as needed (allergies).   Yes [provider]  ELIQUIS 5 MG TABS tablet TAKE 1 TABLET BY MOUTH TWICE A DAY 03/07/22  Yes Dew, Erskine Squibb, MD  levothyroxine (SYNTHROID, LEVOTHROID) 50 MCG tablet TAKE ONE (1) TABLET BY MOUTH EVERY DAY Patient taking differently: Take  50 mcg by mouth daily. 05/23/18  Yes Arnett, Yvetta Coder, FNP  losartan (COZAAR) 100 MG tablet TAKE ONE (1) TABLET BY MOUTH EVERY DAY Patient taking differently: Take 100 mg by mouth daily. 07/17/17  Yes Arnett, Yvetta Coder, FNP  Multiple Vitamins-Minerals (MULTI COMPLETE PO) Take 1 tablet by mouth daily.    Yes [provider]  nicotine (NICODERM CQ - DOSED  IN MG/24 HOURS) 21 mg/24hr patch Place 1 patch (21 mg total) onto the skin daily. 12/25/19  Yes Stegmayer, Joelene Millin A, PA-C  pantoprazole (PROTONIX) 40 MG tablet TAKE ONE (1) TABLET EACH DAY Patient taking differently: Take 20 mg by mouth daily. 07/17/17  Yes Burnard Hawthorne, FNP    Allergies as of 09/10/2022 - Review Complete 02/27/2022  Allergen Reaction Noted   Aspirin Other (See Comments) 09/22/2015   Nsaids Other (See Comments) 09/22/2015   Penicillins Itching, Rash, and Other (See Comments) 04/14/2015   Pollen extract Other (See Comments)     Family History  Problem Relation Age of Onset   Diabetes Mother    Hypertension Mother    Diabetes Sister    Hypertension Sister     Social History   Socioeconomic History   Marital status: Widowed    Spouse name: Not on file   Number of children: Not on file   Years of education: Not on file   Highest education level: Not on file  Occupational History   Not on file  Tobacco Use   Smoking status: Light Smoker    Packs/day: 0.50    Years: 30.00    Total pack years: 15.00    Types: Cigarettes    Last attempt to quit: 10/14/2018    Years since quitting: 4.0   Smokeless tobacco: Never  Vaping Use   Vaping Use: Never used  Substance and Sexual Activity   Alcohol use: Yes    Alcohol/week: 0.0 standard drinks of alcohol    Comment: occassionally/ whiskey on weekend; total of 4 drinks per week   Drug use: No   Sexual activity: Not Currently  Other Topics Concern   Not on file  Social History Narrative   From [place].   [place]- 20 hours week.    Husband in nursing home in [place].    1 child; 3 step children   Lives by herself, independent         Social Determinants of Health   Financial Resource Strain: Not on file  Food Insecurity: Not on file  Transportation Needs: Not on file  Physical Activity: Not on file  Stress: Not on file  Social Connections: Not on file  Intimate Partner  Violence: Not on file    Review of Systems: See HPI, otherwise negative ROS  Physical Exam: There were no vitals taken for this visit. General:   Alert,  pleasant and cooperative in NAD Head:  Normocephalic and atraumatic. Lungs:  Clear to auscultation.    Heart:  Regular rate and rhythm.   Impression/Plan: Lindsay [name] is here for ophthalmic surgery.  Risks, benefits, limitations, and alternatives regarding ophthalmic surgery have been reviewed with the patient.  Questions have been answered.  All parties agreeable.   Leandrew Koyanagi, MD  10/17/2022, 12:41 PM

## 2022-10-17 NOTE — Anesthesia Postprocedure Evaluation (Signed)
Anesthesia Post Note  Patient: Lindsay [name]  Procedure(s) Performed: CATARACT EXTRACTION PHACO AND INTRAOCULAR LENS PLACEMENT (IOC) LEFT malyugin  7.56  01:03.5 (Left: Eye)  Patient location during evaluation: PACU Anesthesia Type: MAC Level of consciousness: awake and alert Pain management: pain level controlled Vital Signs Assessment: post-procedure vital signs reviewed and stable Respiratory status: spontaneous breathing, nonlabored ventilation, respiratory function stable and patient connected to nasal cannula oxygen Cardiovascular status: stable and blood pressure returned to baseline Postop Assessment: no apparent nausea or vomiting Anesthetic complications: no   There were no known notable events for this encounter.   Last Vitals:  Vitals:   10/17/22 1404 10/17/22 1409  BP: 104/61 112/70  Pulse: (!) 55 (!) 58  Resp: 11 17  Temp: (!) 36.1 C (!) 36.4 C  SpO2: 98% 98%    Last Pain:  Vitals:   10/17/22 1409  TempSrc:   PainSc: 0-No pain                 Ilene Qua

## 2022-10-17 NOTE — Transfer of Care (Signed)
Immediate Anesthesia Transfer of Care Note  Patient: Lindsay [name]  Procedure(s) Performed: CATARACT EXTRACTION PHACO AND INTRAOCULAR LENS PLACEMENT (IOC) LEFT malyugin  7.56  01:03.5 (Left: Eye)  Patient Location: PACU  Anesthesia Type: MAC  Level of Consciousness: awake, alert  and patient cooperative  Airway and Oxygen Therapy: Patient Spontanous Breathing and Patient connected to supplemental oxygen  Post-op Assessment: Post-op Vital signs reviewed, Patient's Cardiovascular Status Stable, Respiratory Function Stable, Patent Airway and No signs of Nausea or vomiting  Post-op Vital Signs: Reviewed and stable  Complications: There were no known notable events for this encounter.

## 2022-10-17 NOTE — Op Note (Signed)
OPERATIVE NOTE  Lindsay [name] [id number] 10/17/2022  PREOPERATIVE DIAGNOSIS:   Nuclear sclerotic cataract left eye with miotic pupil      H25.12   POSTOPERATIVE DIAGNOSIS:   Nuclear sclerotic cataract left eye with miotic pupil.     PROCEDURE:  Phacoemulsification with posterior chamber intraocular lens implantation of the left eye which required pupil stretching with the Malyugin pupil expansion device  Ultrasound time: Procedure(s): CATARACT EXTRACTION PHACO AND INTRAOCULAR LENS PLACEMENT (IOC) LEFT malyugin  7.56  01:03.5 (Left)  LENS:  * No implants in log *   DIB00 22.0    SURGEON:  Wyonia Hough, MD   ANESTHESIA: Topical with tetracaine drops and 2% Xylocaine jelly, augmented with 1% preservative-free intracameral lidocaine.   COMPLICATIONS:  None.   DESCRIPTION OF PROCEDURE:  The patient was identified in the holding room and transported to the operating room and placed in the supine position under the operating microscope.  The left eye was identified as the operative eye and it was prepped and draped in the usual sterile ophthalmic fashion.   A 1 millimeter clear-corneal paracentesis was made at the 1:30 position.  The anterior chamber was filled with Viscoat viscoelastic.  0.5 ml of preservative-free 1% lidocaine was injected into the anterior chamber.  A 2.4 millimeter keratome was used to make a near-clear corneal incision at the 10:30 position.  A Malyugin pupil expander was then placed through the main incision and into the anterior chamber of the eye.  The edge of the iris was secured on the lip of the pupil expander and it was released, thereby expanding the pupil to approximately 7 millimeters for completion of the cataract surgery.  Additional Viscoat was placed in the anterior chamber.  A cystotome and capsulorrhexis forceps were used to make a curvilinear capsulorrhexis.   Balanced salt solution was used to hydrodissect and hydrodelineate the lens nucleus.    Phacoemulsification was used in stop and chop fashion to remove the lens, nucleus and epinucleus.  The remaining cortex was aspirated using the irrigation aspiration handpiece.  Additional Provisc was placed into the eye to distend the capsular bag for lens placement.  A lens was then injected into the capsular bag.  The pupil expanding ring was removed using a Kuglen hook and insertion device. The remaining viscoelastic was aspirated from the capsular bag and the anterior chamber.  The anterior chamber was filled with balanced salt solution to inflate to a physiologic pressure.   Wounds were hydrated with balanced salt solution.  The anterior chamber was inflated to a physiologic pressure with balanced salt solution.  No wound leaks were noted. Vigamox 0.2 ml of a '1mg'$  per ml solution was injected into the anterior chamber for a dose of 0.2 mg of intracameral antibiotic at the completion of the case.   Timolol and Brimonidine drops were applied to the eye.  The patient was taken to the recovery room in stable condition without complications of anesthesia or surgery.  [name] 10/17/2022, 2:01 PM

## 2022-10-17 NOTE — Anesthesia Preprocedure Evaluation (Signed)
Anesthesia Evaluation  Patient identified by MRN, date of birth, ID band Patient awake    Reviewed: Allergy & Precautions, NPO status , Patient's Chart, lab work & pertinent test results  Airway Mallampati: II  TM Distance: >3 FB Neck ROM: full    Dental  (+) Edentulous Upper   Pulmonary neg pulmonary ROS, Current Smoker and Patient abstained from smoking.   Pulmonary exam normal        Cardiovascular hypertension, On Medications and On Home Beta Blockers + Peripheral Vascular Disease  Normal cardiovascular exam     Neuro/Psych  PSYCHIATRIC DISORDERS Anxiety Depression    negative neurological ROS     GI/Hepatic Neg liver ROS,GERD  ,,  Endo/Other  Hypothyroidism    Renal/GU      Musculoskeletal  (+) Arthritis , Osteoarthritis,    Abdominal   Peds  Hematology negative hematology ROS (+) Hx of DVT  On anticoagulation    Anesthesia Other Findings Past Medical History: No date: Anemia No date: Anxiety No date: Arthritis     Comment:  osteo, back No date: Depression No date: Diverticulosis 12/2019: DVT (deep venous thrombosis) ([facility])     Comment:  left leg No date: GERD (gastroesophageal reflux disease) No date: Hematochezia No date: Hypertension     Comment:  controlled on meds No date: Hypothyroidism No date: PAD (peripheral artery disease) ([place]) No date: Wears dentures     Comment:  Full upper  Past Surgical History: No date: ABDOMINAL HYSTERECTOMY 1995: CARDIAC CATHETERIZATION     Comment:  normal, Dr Clayborn Bigness 10/03/2022: CATARACT EXTRACTION W/PHACO; Right     Comment:  Procedure: CATARACT EXTRACTION PHACO AND INTRAOCULAR               LENS PLACEMENT (IOC) RIGHT 9.96 01:10.5;  Surgeon:               Leandrew Koyanagi, MD;  Location: [place];              Service: Ophthalmology;  Laterality: Right; 12/29/08: COLONOSCOPY 04/18/2015: COLONOSCOPY; N/A     Comment:  Procedure: COLONOSCOPY;   Surgeon: Lucilla Lame, MD;                Location: [place];  Service:               Gastroenterology;  Laterality: N/A;  cecum time- 0957 No date: FOOT SURGERY; Right 10/05/2015: KNEE ARTHROSCOPY; Right     Comment:  Procedure: ARTHROSCOPY KNEE, partial lateral               menisectomy;  Surgeon: Earnestine Leys, MD;  Location: [facility]              [facility];  Service: Orthopedics;  Laterality: Right; 02/27/2018: LOWER EXTREMITY ANGIOGRAPHY; Left     Comment:  Procedure: LOWER EXTREMITY ANGIOGRAPHY;  Surgeon: Algernon Huxley, MD;  Location: [place] [facility];  Service:               Cardiovascular;  Laterality: Left; 10/15/2018: LOWER EXTREMITY ANGIOGRAPHY; Left     Comment:  Procedure: LOWER EXTREMITY ANGIOGRAPHY;  Surgeon: Algernon Huxley, MD;  Location: [place] [facility];  Service:               Cardiovascular;  Laterality: Left; 12/29/2018: LOWER EXTREMITY ANGIOGRAPHY; Left  Comment:  Procedure: LOWER EXTREMITY ANGIOGRAPHY;  Surgeon: Algernon Huxley, MD;  Location: [place] [facility];  Service:               Cardiovascular;  Laterality: Left; 12/23/2019: LOWER EXTREMITY ANGIOGRAPHY; Left     Comment:  Procedure: LOWER EXTREMITY ANGIOGRAPHY;  Surgeon: Algernon Huxley, MD;  Location: [place] [facility];  Service:               Cardiovascular;  Laterality: Left; 12/24/2019: LOWER EXTREMITY ANGIOGRAPHY; Left     Comment:  Procedure: Lower Extremity Angiography;  Surgeon: Algernon Huxley, MD;  Location: [place] [facility];  Service:               Cardiovascular;  Laterality: Left; 04/18/2015: POLYPECTOMY     Comment:  Procedure: POLYPECTOMY INTESTINAL;  Surgeon: Lucilla Lame, MD;  Location: [place];  Service:               Gastroenterology;; No date: WRIST SURGERY; Right     Reproductive/Obstetrics negative OB ROS                             Anesthesia  Physical Anesthesia Plan  ASA: 3  Anesthesia Plan: MAC   Post-op Pain Management: Minimal or no pain anticipated   Induction: Intravenous  PONV Risk Score and Plan:   Airway Management Planned: Natural Airway and Nasal Cannula  Additional Equipment:   Intra-op Plan:   Post-operative Plan:   Informed Consent: I have reviewed the patients History and Physical, chart, labs and discussed the procedure including the risks, benefits and alternatives for the proposed anesthesia with the patient or authorized representative who has indicated his/her understanding and acceptance.     Dental Advisory Given  Plan Discussed with: Anesthesiologist, CRNA and Surgeon  Anesthesia Plan Comments: (Patient consented for risks of anesthesia including but not limited to:  - adverse reactions to medications - damage to eyes, teeth, lips or other oral mucosa - nerve damage due to positioning  - sore throat or hoarseness - Damage to heart, brain, nerves, lungs, other parts of body or loss of life  Patient voiced understanding.)       Anesthesia Quick Evaluation

## 2022-10-18 ENCOUNTER — Encounter: Payer: Self-pay | Admitting: Ophthalmology

## 2023-01-01 ENCOUNTER — Other Ambulatory Visit: Payer: Self-pay | Admitting: Family Medicine

## 2023-01-01 DIAGNOSIS — Z1231 Encounter for screening mammogram for malignant neoplasm of breast: Secondary | ICD-10-CM

## 2023-01-22 ENCOUNTER — Ambulatory Visit
Admission: RE | Admit: 2023-01-22 | Discharge: 2023-01-22 | Disposition: A | Discharge status: Home / Self Care | Payer: Medicare HMO | Source: Ambulatory Visit | Attending: Family Medicine | Admitting: Family Medicine

## 2023-01-22 ENCOUNTER — Inpatient Hospital Stay
Admission: RE | Admit: 2023-01-22 | Discharge: 2023-01-22 | Disposition: A | Discharge status: Home / Self Care | Payer: Self-pay | Source: Ambulatory Visit | Attending: *Deleted | Admitting: *Deleted

## 2023-01-22 ENCOUNTER — Other Ambulatory Visit: Payer: Self-pay | Admitting: *Deleted

## 2023-01-22 DIAGNOSIS — Z1231 Encounter for screening mammogram for malignant neoplasm of breast: Secondary | ICD-10-CM

## 2023-03-01 ENCOUNTER — Ambulatory Visit (INDEPENDENT_AMBULATORY_CARE_PROVIDER_SITE_OTHER): Payer: Medicare HMO | Admitting: Vascular Surgery

## 2023-03-01 ENCOUNTER — Encounter (INDEPENDENT_AMBULATORY_CARE_PROVIDER_SITE_OTHER): Payer: Medicare HMO

## 2023-03-06 ENCOUNTER — Other Ambulatory Visit (INDEPENDENT_AMBULATORY_CARE_PROVIDER_SITE_OTHER): Payer: Self-pay | Admitting: Vascular Surgery

## 2023-03-06 DIAGNOSIS — I739 Peripheral vascular disease, unspecified: Secondary | ICD-10-CM

## 2023-03-12 ENCOUNTER — Other Ambulatory Visit: Payer: Self-pay | Admitting: Physician Assistant

## 2023-03-12 DIAGNOSIS — I6523 Occlusion and stenosis of bilateral carotid arteries: Secondary | ICD-10-CM

## 2023-03-13 ENCOUNTER — Other Ambulatory Visit (INDEPENDENT_AMBULATORY_CARE_PROVIDER_SITE_OTHER): Payer: Self-pay | Admitting: Vascular Surgery

## 2023-03-15 ENCOUNTER — Ambulatory Visit
Admission: RE | Admit: 2023-03-15 | Discharge: 2023-03-15 | Disposition: A | Discharge status: Home / Self Care | Payer: Medicare HMO | Source: Ambulatory Visit | Attending: Physician Assistant | Admitting: Physician Assistant

## 2023-03-15 DIAGNOSIS — I6523 Occlusion and stenosis of bilateral carotid arteries: Secondary | ICD-10-CM | POA: Insufficient documentation

## 2023-03-19 ENCOUNTER — Ambulatory Visit (INDEPENDENT_AMBULATORY_CARE_PROVIDER_SITE_OTHER): Payer: Medicare HMO

## 2023-03-19 ENCOUNTER — Encounter (INDEPENDENT_AMBULATORY_CARE_PROVIDER_SITE_OTHER): Payer: Self-pay | Admitting: Vascular Surgery

## 2023-03-19 ENCOUNTER — Ambulatory Visit (INDEPENDENT_AMBULATORY_CARE_PROVIDER_SITE_OTHER): Payer: Medicare HMO | Admitting: Vascular Surgery

## 2023-03-19 VITALS — BP 131/68 | HR 57 | Resp 16 | Wt 152.6 lb

## 2023-03-19 DIAGNOSIS — I70222 Atherosclerosis of native arteries of extremities with rest pain, left leg: Secondary | ICD-10-CM

## 2023-03-19 DIAGNOSIS — E785 Hyperlipidemia, unspecified: Secondary | ICD-10-CM

## 2023-03-19 DIAGNOSIS — Z9889 Other specified postprocedural states: Secondary | ICD-10-CM

## 2023-03-19 DIAGNOSIS — I10 Essential (primary) hypertension: Secondary | ICD-10-CM

## 2023-03-19 DIAGNOSIS — I739 Peripheral vascular disease, unspecified: Secondary | ICD-10-CM | POA: Diagnosis not present

## 2023-03-19 NOTE — Progress Notes (Signed)
MRN : [id number]  Lindsay [name] is a 82 y.o. ([birth year]) female who presents with chief complaint of  Chief Complaint  Patient presents with   Follow-up    Ultrasound follow up  .  History of Present Illness: Patient returns today in follow up of her PAD.  She is currently doing well.  She denies any lifestyle limiting claudication, ischemic rest pain, or ulceration.  She does have some mild leg pain with activity.  Her ABIs today are stable at 0.63 on the right and 0.93 on the left with monophasic waveforms on the right and biphasic waveforms on the left.  Current Outpatient Medications  Medication Sig Dispense Refill   acetaminophen (TYLENOL) 500 MG tablet Take 1,000 mg by mouth daily.     amLODipine (NORVASC) 10 MG tablet Take 10 mg by mouth daily.     atorvastatin (LIPITOR) 10 MG tablet Take 1 tablet by mouth daily.     bisoprolol-hydrochlorothiazide (ZIAC) 10-6.25 MG tablet TAKE 1 TABLET BY MOUTH DAILY (Patient taking differently: Take 1 tablet by mouth daily.) 30 tablet 0   DiphenhydrAMINE HCl (ALKA-SELTZER PLUS ALLERGY PO) Take 1 tablet by mouth daily as needed (allergies).     ELIQUIS 5 MG TABS tablet TAKE 1 TABLET BY MOUTH TWICE A DAY 60 tablet 5   levothyroxine (SYNTHROID, LEVOTHROID) 50 MCG tablet TAKE ONE (1) TABLET BY MOUTH EVERY DAY (Patient taking differently: Take 50 mcg by mouth daily.) 90 tablet 0   losartan (COZAAR) 100 MG tablet TAKE ONE (1) TABLET BY MOUTH EVERY DAY (Patient taking differently: Take 100 mg by mouth daily.) 90 tablet 3   Multiple Vitamins-Minerals (MULTI COMPLETE PO) Take 1 tablet by mouth daily.      pantoprazole (PROTONIX) 40 MG tablet TAKE ONE (1) TABLET EACH DAY (Patient taking differently: Take 20 mg by mouth daily.) 90 tablet 3   nicotine (NICODERM CQ - DOSED IN MG/24 HOURS) 21 mg/24hr patch Place 1 patch (21 mg total) onto the skin daily. (Patient not taking: Reported on 03/19/2023) 28 patch 0   No current facility-administered medications  for this visit.    Past Medical History:  Diagnosis Date   Anemia    Anxiety    Arthritis    osteo, back   Depression    Diverticulosis    DVT (deep venous thrombosis) 12/2019   left leg   GERD (gastroesophageal reflux disease)    Hematochezia    Hypertension    controlled on meds   Hypothyroidism    PAD (peripheral artery disease)    Wears dentures    Full upper    Past Surgical History:  Procedure Laterality Date   ABDOMINAL HYSTERECTOMY     CARDIAC CATHETERIZATION  1995   normal, Dr Juliann Pares   CATARACT EXTRACTION W/PHACO Right 10/03/2022   Procedure: CATARACT EXTRACTION PHACO AND INTRAOCULAR LENS PLACEMENT (IOC) RIGHT 9.96 01:10.5;  Surgeon: Lockie Mola, MD;  Location: [facility];  Service: Ophthalmology;  Laterality: Right;   CATARACT EXTRACTION W/PHACO Left 10/17/2022   Procedure: CATARACT EXTRACTION PHACO AND INTRAOCULAR LENS PLACEMENT (IOC) LEFT malyugin  7.56  01:03.5;  Surgeon: Lockie Mola, MD;  Location: [facility];  Service: Ophthalmology;  Laterality: Left;   COLONOSCOPY  12/29/08   COLONOSCOPY N/A 04/18/2015   Procedure: COLONOSCOPY;  Surgeon: Midge Minium, MD;  Location: [facility];  Service: Gastroenterology;  Laterality: N/A;  cecum time- 0957   FOOT SURGERY Right    KNEE ARTHROSCOPY Right 10/05/2015   Procedure:  ARTHROSCOPY KNEE, partial lateral menisectomy;  Surgeon: Deeann Saint, MD;  Location: [facility];  Service: Orthopedics;  Laterality: Right;   LOWER EXTREMITY ANGIOGRAPHY Left 02/27/2018   Procedure: LOWER EXTREMITY ANGIOGRAPHY;  Surgeon: Annice Needy, MD;  Location: [facility];  Service: Cardiovascular;  Laterality: Left;   LOWER EXTREMITY ANGIOGRAPHY Left 10/15/2018   Procedure: LOWER EXTREMITY ANGIOGRAPHY;  Surgeon: Annice Needy, MD;  Location: [facility];  Service: Cardiovascular;  Laterality: Left;   LOWER EXTREMITY ANGIOGRAPHY Left 12/29/2018   Procedure: LOWER EXTREMITY ANGIOGRAPHY;   Surgeon: Annice Needy, MD;  Location: [facility];  Service: Cardiovascular;  Laterality: Left;   LOWER EXTREMITY ANGIOGRAPHY Left 12/23/2019   Procedure: LOWER EXTREMITY ANGIOGRAPHY;  Surgeon: Annice Needy, MD;  Location: [facility];  Service: Cardiovascular;  Laterality: Left;   LOWER EXTREMITY ANGIOGRAPHY Left 12/24/2019   Procedure: Lower Extremity Angiography;  Surgeon: Annice Needy, MD;  Location: [facility];  Service: Cardiovascular;  Laterality: Left;   POLYPECTOMY  04/18/2015   Procedure: POLYPECTOMY INTESTINAL;  Surgeon: Midge Minium, MD;  Location: [facility];  Service: Gastroenterology;;   WRIST SURGERY Right      Social History   Tobacco Use   Smoking status: Light Smoker    Packs/day: 0.50    Years: 30.00    Additional pack years: 0.00    Total pack years: 15.00    Types: Cigarettes    Last attempt to quit: 10/14/2018    Years since quitting: 4.4   Smokeless tobacco: Never  Vaping Use   Vaping Use: Never used  Substance Use Topics   Alcohol use: Yes    Alcohol/week: 0.0 standard drinks of alcohol    Comment: occassionally/ whiskey on weekend; total of 4 drinks per week   Drug use: No      Family History  Problem Relation Age of Onset   Diabetes Mother    Hypertension Mother    Diabetes Sister    Hypertension Sister      Allergies  Allergen Reactions   Aspirin Other (See Comments)    GI BLEED   Nsaids Other (See Comments)    GI BLEED   Penicillins Itching, Rash and Other (See Comments)    Has patient had a PCN reaction causing immediate rash, facial/tongue/throat swelling, SOB or lightheadedness with hypotension: Yes Has patient had a PCN reaction causing severe rash involving mucus membranes or skin necrosis: No Has patient had a PCN reaction that required hospitalization No Has patient had a PCN reaction occurring within the last 10 years: No If all of the above answers are "NO", then may proceed with Cephalosporin  use.    Pollen Extract Other (See Comments)    Congestion, Eye Irritation     REVIEW OF SYSTEMS (Negative unless checked)   Constitutional: Weight loss  Fever  Chills Cardiac: Chest pain   Chest pressure   Palpitations   Shortness of breath when laying flat   Shortness of breath at rest   Shortness of breath with exertion. Vascular:  Pain in legs with walking   Pain in legs at rest   Pain in legs when laying flat   Claudication   Pain in feet when walking  Pain in feet at rest  Pain in feet when laying flat   History of DVT   Phlebitis   Swelling in legs   Varicose veins   Non-healing ulcers Pulmonary:   Uses home oxygen     Productive cough   Hemoptysis   Wheeze  COPD   Asthma Neurologic:  Dizziness  Blackouts   Seizures   History of stroke   History of TIA  Aphasia   Temporary blindness   Dysphagia   Weakness or numbness in arms   Weakness or numbness in legs Musculoskeletal:  Arthritis   Joint swelling   Joint pain   Low back pain Hematologic:  Easy bruising  Easy bleeding   Hypercoagulable state   Anemic   Gastrointestinal:  Blood in stool   Vomiting blood  Gastroesophageal reflux/heartburn   Abdominal pain Genitourinary:  Chronic kidney disease   Difficult urination  Frequent urination  Burning with urination   Hematuria Skin:  Rashes   Ulcers   Wounds Psychological:  History of anxiety    History of major depression.  Physical Examination  BP 131/68 (BP Location: Left Arm)   Pulse (!) 57   Resp 16   Wt 152 lb 9.6 oz (69.2 kg)   BMI 25.39 kg/m  Gen:  WD/WN, NAD Head: [place]/AT, No temporalis wasting. Ear/Nose/Throat: Hearing grossly intact, nares w/o erythema or drainage Eyes: Conjunctiva clear. Sclera non-icteric Neck: Supple.  Trachea midline Pulmonary:  Good air movement, no use of accessory muscles.  Cardiac: RRR, no JVD Vascular:  Vessel Right Left   Radial Palpable Palpable                          PT Not palpable 2+ palpable  DP Trace palpable Trace palpable   Gastrointestinal: soft, non-tender/non-distended. No guarding/reflex.  Musculoskeletal: M/S 5/5 throughout.  No deformity or atrophy.  No edema. Neurologic: Sensation grossly intact in extremities.  Symmetrical.  Speech is fluent.  Psychiatric: Judgment intact, Mood & affect appropriate for pt's clinical situation. Dermatologic: No rashes or ulcers noted.  No cellulitis or open wounds.      Labs No results found for this or any previous visit (from the past 2160 hour(s)).  Radiology US Carotid Bilateral  Result Date: 03/15/2023 CLINICAL DATA:  Carotid atherosclerosis. History of hypertension, hyperlipidemia and smoking. EXAM: BILATERAL CAROTID DUPLEX ULTRASOUND TECHNIQUE: Wallace Cullens scale imaging, color Doppler and duplex ultrasound were performed of bilateral carotid and vertebral arteries in the neck. COMPARISON:  None Available. FINDINGS: Criteria: Quantification of carotid stenosis is based on velocity parameters that correlate the residual internal carotid diameter with NASCET-based stenosis levels, using the diameter of the distal internal carotid lumen as the denominator for stenosis measurement. The following velocity measurements were obtained: RIGHT ICA: 102/28 cm/sec CCA: 54/12 cm/sec SYSTOLIC ICA/CCA RATIO:  1.9 ECA: 72 cm/sec LEFT ICA: 109/20 cm/sec CCA: 48/14 cm/sec SYSTOLIC ICA/CCA RATIO:  2.3 ECA: 44 cm/sec RIGHT CAROTID ARTERY: There is a minimal amount of eccentric hypoechoic plaque within the right carotid bulb (image 15). There is a minimal amount of eccentric echogenic plaque involving the origin of the right internal carotid artery (image 23), not resulting in elevated peak systolic velocities within the interrogated course of the right internal carotid artery to suggest a hemodynamically significant stenosis. RIGHT VERTEBRAL ARTERY:  Antegrade flow LEFT  CAROTID ARTERY: There is a minimal amount of eccentric calcified plaque within the mid (image 41) and distal (image 45) aspects of the left common carotid artery. There is a moderate amount of eccentric echogenic plaque within left carotid bulb (image 50), extending to involve the origin and proximal aspects of the right internal carotid artery (image 57), not resulting in elevated peak systolic velocities within the  interrogated course of the left internal carotid artery to suggest a hemodynamically significant stenosis. LEFT VERTEBRAL ARTERY:  Antegrade flow IMPRESSION: Minimal to moderate amount of bilateral atherosclerotic plaque, left-greater-than-right, not resulting in a hemodynamically significant stenosis within either internal carotid artery. Electronically Signed   By: Simonne Come M.D.   On: 03/15/2023 13:48    Assessment/Plan Hypertension blood pressure control important in reducing the progression of atherosclerotic disease. On appropriate oral medications.     Hyperlipidemia lipid control important in reducing the progression of atherosclerotic disease. Continue statin therapy  Atherosclerosis of native arteries of extremity with rest pain (HCC) Her ABIs today are stable at 0.63 on the right and 0.93 on the left with monophasic waveforms on the right and biphasic waveforms on the left.  Clinically stable.  Continue current medical regimen.  Recheck in 1 year.  Discussed if she develops any significant pain, nonhealing ulceration, or infection to contact our office.    Festus Barren, MD  03/19/2023 9:27 AM    This note was created with Dragon medical transcription system.  Any errors from dictation are purely unintentional

## 2023-03-19 NOTE — Assessment & Plan Note (Signed)
Her ABIs today are stable at 0.63 on the right and 0.93 on the left with monophasic waveforms on the right and biphasic waveforms on the left.  Clinically stable.  Continue current medical regimen.  Recheck in 1 year.  Discussed if she develops any significant pain, nonhealing ulceration, or infection to contact our office.

## 2023-03-20 LAB — VAS US ABI WITH/WO TBI
Left ABI: 0.93
Right ABI: 0.63

## 2023-05-25 ENCOUNTER — Encounter (INDEPENDENT_AMBULATORY_CARE_PROVIDER_SITE_OTHER): Payer: Self-pay | Admitting: Vascular Surgery

## 2023-05-27 NOTE — Telephone Encounter (Signed)
I spoke with pt and she states understanding and ask that I fax the paper to the dentist.  I will fax the paper over

## 2023-09-06 ENCOUNTER — Ambulatory Visit: Payer: Medicare HMO

## 2023-09-06 DIAGNOSIS — Z23 Encounter for immunization: Secondary | ICD-10-CM

## 2023-09-06 DIAGNOSIS — Z719 Counseling, unspecified: Secondary | ICD-10-CM

## 2023-09-06 NOTE — Progress Notes (Signed)
In nurse clinic for immunizations. Patient requesting Covid Comirnaty 12+. Voices no concerns. VIS reviewed and given to patient. Vaccine tolerated well; no issues noted. [facility] updated and copy given to patient.  Abagail Kitchens, RN

## 2024-03-11 ENCOUNTER — Other Ambulatory Visit (INDEPENDENT_AMBULATORY_CARE_PROVIDER_SITE_OTHER): Payer: Self-pay | Admitting: Vascular Surgery

## 2024-03-17 ENCOUNTER — Ambulatory Visit (INDEPENDENT_AMBULATORY_CARE_PROVIDER_SITE_OTHER): Payer: Medicare HMO | Admitting: Vascular Surgery

## 2024-03-17 ENCOUNTER — Encounter (INDEPENDENT_AMBULATORY_CARE_PROVIDER_SITE_OTHER): Payer: Medicare HMO

## 2024-03-23 ENCOUNTER — Other Ambulatory Visit (INDEPENDENT_AMBULATORY_CARE_PROVIDER_SITE_OTHER): Payer: Self-pay | Admitting: Vascular Surgery

## 2024-03-23 DIAGNOSIS — Z9889 Other specified postprocedural states: Secondary | ICD-10-CM

## 2024-03-24 ENCOUNTER — Ambulatory Visit (INDEPENDENT_AMBULATORY_CARE_PROVIDER_SITE_OTHER): Admitting: Vascular Surgery

## 2024-03-24 ENCOUNTER — Ambulatory Visit (INDEPENDENT_AMBULATORY_CARE_PROVIDER_SITE_OTHER)

## 2024-03-24 ENCOUNTER — Encounter (INDEPENDENT_AMBULATORY_CARE_PROVIDER_SITE_OTHER): Payer: Self-pay | Admitting: Vascular Surgery

## 2024-03-24 VITALS — BP 145/73 | HR 69 | Resp 16 | Ht 64.0 in | Wt 142.4 lb

## 2024-03-24 DIAGNOSIS — E785 Hyperlipidemia, unspecified: Secondary | ICD-10-CM

## 2024-03-24 DIAGNOSIS — I70222 Atherosclerosis of native arteries of extremities with rest pain, left leg: Secondary | ICD-10-CM | POA: Diagnosis not present

## 2024-03-24 DIAGNOSIS — I739 Peripheral vascular disease, unspecified: Secondary | ICD-10-CM

## 2024-03-24 DIAGNOSIS — I10 Essential (primary) hypertension: Secondary | ICD-10-CM | POA: Diagnosis not present

## 2024-03-24 DIAGNOSIS — Z9889 Other specified postprocedural states: Secondary | ICD-10-CM | POA: Diagnosis not present

## 2024-03-24 NOTE — Assessment & Plan Note (Signed)
 Her ABIs today are slightly improved to 0.72 on the right and slightly decreased to 0.85 on the left with digit pressures of 53 on the right and 54 on the left.  Multiple previous revascularizations previously.  Has some diminished perfusion but this is roughly stable and does not have any current limb threatening symptoms.  No role for intervention at this time.  Continue annual follow-up unless symptoms worsen.

## 2024-03-24 NOTE — Progress Notes (Signed)
 MRN : [id number]  Lindsay [name] is a 83 y.o. ([birth year]) female who presents with chief complaint of  Chief Complaint  Patient presents with   Follow-up    39yr ABI follow up  .  History of Present Illness: Patient returns today in follow up of her PAD. She is doing fairly well.  She has some mild claudication symptoms lifestyle limiting.  She does not have rest pain or ulceration.  She has had multiple previous interventions but none in the last 3 to 4 years.  Her ABIs today are slightly improved to 0.72 on the right and slightly decreased to 0.85 on the left with digit pressures of 53 on the right and 54 on the left.  Current Outpatient Medications  Medication Sig Dispense Refill   acetaminophen  (TYLENOL ) 500 MG tablet Take 1,000 mg by mouth daily.     amLODipine  (NORVASC ) 10 MG tablet Take 10 mg by mouth daily.     atorvastatin  (LIPITOR) 10 MG tablet Take 1 tablet by mouth daily.     bisoprolol -hydrochlorothiazide  (ZIAC ) 10-6.25 MG tablet TAKE 1 TABLET BY MOUTH DAILY (Patient taking differently: Take 1 tablet by mouth daily.) 30 tablet 0   DiphenhydrAMINE  HCl (ALKA-SELTZER PLUS ALLERGY PO) Take 1 tablet by mouth daily as needed (allergies).     ELIQUIS  5 MG TABS tablet TAKE 1 TABLET BY MOUTH TWICE A DAY 60 tablet 5   levothyroxine  (SYNTHROID , LEVOTHROID) 50 MCG tablet TAKE ONE (1) TABLET BY MOUTH EVERY DAY (Patient taking differently: Take 50 mcg by mouth daily.) 90 tablet 0   losartan  (COZAAR ) 100 MG tablet TAKE ONE (1) TABLET BY MOUTH EVERY DAY (Patient taking differently: Take 100 mg by mouth daily.) 90 tablet 3   Multiple Vitamins-Minerals (MULTI COMPLETE PO) Take 1 tablet by mouth daily.      pantoprazole  (PROTONIX ) 40 MG tablet TAKE ONE (1) TABLET EACH DAY (Patient taking differently: Take 20 mg by mouth daily.) 90 tablet 3   nicotine  (NICODERM CQ  - DOSED IN MG/24 HOURS) 21 mg/24hr patch Place 1 patch (21 mg total) onto the skin daily. (Patient not taking: Reported on 03/24/2024)  28 patch 0   No current facility-administered medications for this visit.    Past Medical History:  Diagnosis Date   Anemia    Anxiety    Arthritis    osteo, back   Depression    Diverticulosis    DVT (deep venous thrombosis) (HCC) 12/2019   left leg   GERD (gastroesophageal reflux disease)    Hematochezia    Hypertension    controlled on meds   Hypothyroidism    PAD (peripheral artery disease) (HCC)    Wears dentures    Full upper    Past Surgical History:  Procedure Laterality Date   ABDOMINAL HYSTERECTOMY     CARDIAC CATHETERIZATION  1995   normal, Dr Beau Bound   CATARACT EXTRACTION W/PHACO Right 10/03/2022   Procedure: CATARACT EXTRACTION PHACO AND INTRAOCULAR LENS PLACEMENT (IOC) RIGHT 9.96 01:10.5;  Surgeon: Annell Kidney, MD;  Location: [facility];  Service: Ophthalmology;  Laterality: Right;   CATARACT EXTRACTION W/PHACO Left 10/17/2022   Procedure: CATARACT EXTRACTION PHACO AND INTRAOCULAR LENS PLACEMENT (IOC) LEFT malyugin  7.56  01:03.5;  Surgeon: Annell Kidney, MD;  Location: [facility];  Service: Ophthalmology;  Laterality: Left;   COLONOSCOPY  12/29/08   COLONOSCOPY N/A 04/18/2015   Procedure: COLONOSCOPY;  Surgeon: Marnee Sink, MD;  Location: [facility];  Service: Gastroenterology;  Laterality: N/A;  cecum time- 0957   FOOT SURGERY Right    KNEE ARTHROSCOPY Right 10/05/2015   Procedure: ARTHROSCOPY KNEE, partial lateral menisectomy;  Surgeon: Marlynn Singer, MD;  Location: [facility];  Service: Orthopedics;  Laterality: Right;   LOWER EXTREMITY ANGIOGRAPHY Left 02/27/2018   Procedure: LOWER EXTREMITY ANGIOGRAPHY;  Surgeon: Celso College, MD;  Location: [facility];  Service: Cardiovascular;  Laterality: Left;   LOWER EXTREMITY ANGIOGRAPHY Left 10/15/2018   Procedure: LOWER EXTREMITY ANGIOGRAPHY;  Surgeon: Celso College, MD;  Location: [facility];  Service: Cardiovascular;  Laterality: Left;   LOWER EXTREMITY  ANGIOGRAPHY Left 12/29/2018   Procedure: LOWER EXTREMITY ANGIOGRAPHY;  Surgeon: Celso College, MD;  Location: [facility];  Service: Cardiovascular;  Laterality: Left;   LOWER EXTREMITY ANGIOGRAPHY Left 12/23/2019   Procedure: LOWER EXTREMITY ANGIOGRAPHY;  Surgeon: Celso College, MD;  Location: [facility];  Service: Cardiovascular;  Laterality: Left;   LOWER EXTREMITY ANGIOGRAPHY Left 12/24/2019   Procedure: Lower Extremity Angiography;  Surgeon: Celso College, MD;  Location: [facility];  Service: Cardiovascular;  Laterality: Left;   POLYPECTOMY  04/18/2015   Procedure: POLYPECTOMY INTESTINAL;  Surgeon: Marnee Sink, MD;  Location: [facility];  Service: Gastroenterology;;   WRIST SURGERY Right      Social History   Tobacco Use   Smoking status: Light Smoker    Current packs/day: 0.00    Average packs/day: 0.5 packs/day for 30.0 years (15.0 ttl pk-yrs)    Types: Cigarettes    Start date: 10/14/1988    Last attempt to quit: 10/14/2018    Years since quitting: 5.4   Smokeless tobacco: Never  Vaping Use   Vaping status: Never Used  Substance Use Topics   Alcohol use: Yes    Alcohol/week: 0.0 standard drinks of alcohol    Comment: occassionally/ whiskey on weekend; total of 4 drinks per week   Drug use: No      Family History  Problem Relation Age of Onset   Diabetes Mother    Hypertension Mother    Diabetes Sister    Hypertension Sister      Allergies  Allergen Reactions   Aspirin  Other (See Comments)    GI BLEED   Nsaids Other (See Comments)    GI BLEED   Penicillins Itching, Rash and Other (See Comments)    Has patient had a PCN reaction causing immediate rash, facial/tongue/throat swelling, SOB or lightheadedness with hypotension: Yes Has patient had a PCN reaction causing severe rash involving mucus membranes or skin necrosis: No Has patient had a PCN reaction that required hospitalization No Has patient had a PCN reaction occurring  within the last 10 years: No If all of the above answers are "NO", then may proceed with Cephalosporin use.    Pollen Extract Other (See Comments)    Congestion, Eye Irritation     REVIEW OF SYSTEMS (Negative unless checked)   Constitutional: [] Weight loss  [] Fever  [] Chills Cardiac: [] Chest pain   [] Chest pressure   [] Palpitations   [] Shortness of breath when laying flat   [] Shortness of breath at rest   [] Shortness of breath with exertion. Vascular:  [x] Pain in legs with walking   [] Pain in legs at rest   [] Pain in legs when laying flat   [x] Claudication   [] Pain in feet when walking  [] Pain in feet at rest  [] Pain in feet when laying flat   [] History of DVT   [] Phlebitis   []   Swelling in legs   [] Varicose veins   [] Non-healing ulcers Pulmonary:   [] Uses home oxygen    [] Productive cough   [] Hemoptysis   [] Wheeze  [] COPD   [] Asthma Neurologic:  [] Dizziness  [] Blackouts   [] Seizures   [] History of stroke   [] History of TIA  [] Aphasia   [] Temporary blindness   [] Dysphagia   [] Weakness or numbness in arms   [] Weakness or numbness in legs Musculoskeletal:  [x] Arthritis   [] Joint swelling   [x] Joint pain   [] Low back pain Hematologic:  [] Easy bruising  [] Easy bleeding   [] Hypercoagulable state   [x] Anemic   Gastrointestinal:  [] Blood in stool   [] Vomiting blood  [x] Gastroesophageal reflux/heartburn   [] Abdominal pain Genitourinary:  [] Chronic kidney disease   [] Difficult urination  [] Frequent urination  [] Burning with urination   [] Hematuria Skin:  [] Rashes   [] Ulcers   [] Wounds Psychological:  [x] History of anxiety   []  History of major depression.   Physical Examination  BP (!) 145/73   Pulse 69   Resp 16   Ht 5\' 4"  (1.626 m)   Wt 142 lb 6.4 oz (64.6 kg)   BMI 24.44 kg/m  Gen:  WD/WN, NAD. Appears younger than stated age. Head: [place]/AT, No temporalis wasting. Ear/Nose/Throat: Hearing grossly intact, nares w/o erythema or drainage Eyes: Conjunctiva clear. Sclera non-icteric Neck:  Supple.  Trachea midline Pulmonary:  Good air movement, no use of accessory muscles.  Cardiac: RRR, no JVD Vascular:  Vessel Right Left  Radial Palpable Palpable                          PT 1+ Palpable 2+ Palpable  DP 1+ Palpable 1+ Palpable   Gastrointestinal: soft, non-tender/non-distended. No guarding/reflex.  Musculoskeletal: M/S 5/5 throughout.  No deformity or atrophy. No edema. Neurologic: Sensation grossly intact in extremities.  Symmetrical.  Speech is fluent.  Psychiatric: Judgment intact, Mood & affect appropriate for pt's clinical situation. Dermatologic: No rashes or ulcers noted.  No cellulitis or open wounds.      Labs No results found for this or any previous visit (from the past 2160 hours).  Radiology No results found.  Assessment/Plan  Atherosclerosis of native arteries of extremity with rest pain (HCC) Her ABIs today are slightly improved to 0.72 on the right and slightly decreased to 0.85 on the left with digit pressures of 53 on the right and 54 on the left.  Multiple previous revascularizations previously.  Has some diminished perfusion but this is roughly stable and does not have any current limb threatening symptoms.  No role for intervention at this time.  Continue annual follow-up unless symptoms worsen.  Hypertension blood pressure control important in reducing the progression of atherosclerotic disease. On appropriate oral medications.     Hyperlipidemia lipid control important in reducing the progression of atherosclerotic disease. Continue statin therapy  Mikki Alexander, MD  03/24/2024 2:26 PM    This note was created with Dragon medical transcription system.  Any errors from dictation are purely unintentional

## 2024-03-25 LAB — VAS US ABI WITH/WO TBI
Left ABI: 0.85
Right ABI: 0.72

## 2024-04-21 ENCOUNTER — Encounter (INDEPENDENT_AMBULATORY_CARE_PROVIDER_SITE_OTHER): Payer: Self-pay

## 2024-07-21 ENCOUNTER — Ambulatory Visit (INDEPENDENT_AMBULATORY_CARE_PROVIDER_SITE_OTHER): Admitting: Vascular Surgery

## 2024-07-21 ENCOUNTER — Encounter (INDEPENDENT_AMBULATORY_CARE_PROVIDER_SITE_OTHER): Payer: Self-pay | Admitting: Vascular Surgery

## 2024-07-21 VITALS — BP 137/75 | HR 65 | Ht 64.0 in | Wt 132.2 lb

## 2024-07-21 DIAGNOSIS — I739 Peripheral vascular disease, unspecified: Secondary | ICD-10-CM

## 2024-07-21 DIAGNOSIS — I10 Essential (primary) hypertension: Secondary | ICD-10-CM

## 2024-07-21 DIAGNOSIS — M1990 Unspecified osteoarthritis, unspecified site: Secondary | ICD-10-CM

## 2024-07-21 DIAGNOSIS — E785 Hyperlipidemia, unspecified: Secondary | ICD-10-CM

## 2024-07-21 NOTE — Progress Notes (Unsigned)
 MRN : [id number]  Lindsay [name] is a 83 y.o. ([birth year]) female who presents with chief complaint of No chief complaint on file. SABRA  History of Present Illness: Patient returns today in follow up of ***  Current Outpatient Medications  Medication Sig Dispense Refill   acetaminophen  (TYLENOL ) 500 MG tablet Take 1,000 mg by mouth daily.     amLODipine  (NORVASC ) 10 MG tablet Take 10 mg by mouth daily.     atorvastatin  (LIPITOR) 10 MG tablet Take 1 tablet by mouth daily.     bisoprolol -hydrochlorothiazide  (ZIAC ) 10-6.25 MG tablet TAKE 1 TABLET BY MOUTH DAILY (Patient taking differently: Take 1 tablet by mouth daily.) 30 tablet 0   DiphenhydrAMINE  HCl (ALKA-SELTZER PLUS ALLERGY PO) Take 1 tablet by mouth daily as needed (allergies).     ELIQUIS  5 MG TABS tablet TAKE 1 TABLET BY MOUTH TWICE A DAY 60 tablet 5   levothyroxine  (SYNTHROID , LEVOTHROID) 50 MCG tablet TAKE ONE (1) TABLET BY MOUTH EVERY DAY (Patient taking differently: Take 50 mcg by mouth daily.) 90 tablet 0   losartan  (COZAAR ) 100 MG tablet TAKE ONE (1) TABLET BY MOUTH EVERY DAY (Patient taking differently: Take 100 mg by mouth daily.) 90 tablet 3   Multiple Vitamins-Minerals (MULTI COMPLETE PO) Take 1 tablet by mouth daily.      nicotine  (NICODERM CQ  - DOSED IN MG/24 HOURS) 21 mg/24hr patch Place 1 patch (21 mg total) onto the skin daily. (Patient not taking: Reported on 03/24/2024) 28 patch 0   pantoprazole  (PROTONIX ) 40 MG tablet TAKE ONE (1) TABLET EACH DAY (Patient taking differently: Take 20 mg by mouth daily.) 90 tablet 3   No current facility-administered medications for this visit.    Past Medical History:  Diagnosis Date   Anemia    Anxiety    Arthritis    osteo, back   Depression    Diverticulosis    DVT (deep venous thrombosis) (HCC) 12/2019   left leg   GERD (gastroesophageal reflux disease)    Hematochezia    Hypertension    controlled on meds   Hypothyroidism    PAD (peripheral artery disease) (HCC)     Wears dentures    Full upper    Past Surgical History:  Procedure Laterality Date   ABDOMINAL HYSTERECTOMY     CARDIAC CATHETERIZATION  1995   normal, Dr Florencio   CATARACT EXTRACTION W/PHACO Right 10/03/2022   Procedure: CATARACT EXTRACTION PHACO AND INTRAOCULAR LENS PLACEMENT (IOC) RIGHT 9.96 01:10.5;  Surgeon: Mittie Gaskin, MD;  Location: [facility];  Service: Ophthalmology;  Laterality: Right;   CATARACT EXTRACTION W/PHACO Left 10/17/2022   Procedure: CATARACT EXTRACTION PHACO AND INTRAOCULAR LENS PLACEMENT (IOC) LEFT malyugin  7.56  01:03.5;  Surgeon: Mittie Gaskin, MD;  Location: [facility];  Service: Ophthalmology;  Laterality: Left;   COLONOSCOPY  12/29/08   COLONOSCOPY N/A 04/18/2015   Procedure: COLONOSCOPY;  Surgeon: Rogelia Copping, MD;  Location: [facility];  Service: Gastroenterology;  Laterality: N/A;  cecum time- 0957   FOOT SURGERY Right    KNEE ARTHROSCOPY Right 10/05/2015   Procedure: ARTHROSCOPY KNEE, partial lateral menisectomy;  Surgeon: Kayla Pinal, MD;  Location: [facility] OR[facility];  Service: Orthopedics;  Laterality: Right;   LOWER EXTREMITY ANGIOGRAPHY Left 02/27/2018   Procedure: LOWER EXTREMITY ANGIOGRAPHY;  Surgeon: Marea Selinda RAMAN, MD;  Location: [facility];  Service: Cardiovascular;  Laterality: Left;   LOWER EXTREMITY ANGIOGRAPHY Left 10/15/2018   Procedure: LOWER EXTREMITY ANGIOGRAPHY;  Surgeon: Marea,  Selinda RAMAN, MD;  Location: [facility];  Service: Cardiovascular;  Laterality: Left;   LOWER EXTREMITY ANGIOGRAPHY Left 12/29/2018   Procedure: LOWER EXTREMITY ANGIOGRAPHY;  Surgeon: Marea Selinda RAMAN, MD;  Location: [facility];  Service: Cardiovascular;  Laterality: Left;   LOWER EXTREMITY ANGIOGRAPHY Left 12/23/2019   Procedure: LOWER EXTREMITY ANGIOGRAPHY;  Surgeon: Marea Selinda RAMAN, MD;  Location: [facility];  Service: Cardiovascular;  Laterality: Left;   LOWER EXTREMITY ANGIOGRAPHY Left 12/24/2019    Procedure: Lower Extremity Angiography;  Surgeon: Marea Selinda RAMAN, MD;  Location: [facility];  Service: Cardiovascular;  Laterality: Left;   POLYPECTOMY  04/18/2015   Procedure: POLYPECTOMY INTESTINAL;  Surgeon: Rogelia Copping, MD;  Location: [facility];  Service: Gastroenterology;;   WRIST SURGERY Right      Social History   Tobacco Use   Smoking status: Light Smoker    Current packs/day: 0.00    Average packs/day: 0.5 packs/day for 30.0 years (15.0 ttl pk-yrs)    Types: Cigarettes    Start date: 10/14/1988    Last attempt to quit: 10/14/2018    Years since quitting: 5.7   Smokeless tobacco: Never  Vaping Use   Vaping status: Never Used  Substance Use Topics   Alcohol use: Yes    Alcohol/week: 0.0 standard drinks of alcohol    Comment: occassionally/ whiskey on weekend; total of 4 drinks per week   Drug use: No      Family History  Problem Relation Age of Onset   Diabetes Mother    Hypertension Mother    Diabetes Sister    Hypertension Sister      Allergies  Allergen Reactions   Aspirin  Other (See Comments)    GI BLEED   Nsaids Other (See Comments)    GI BLEED   Penicillins Itching, Rash and Other (See Comments)    Has patient had a PCN reaction causing immediate rash, facial/tongue/throat swelling, SOB or lightheadedness with hypotension: Yes Has patient had a PCN reaction causing severe rash involving mucus membranes or skin necrosis: No Has patient had a PCN reaction that required hospitalization No Has patient had a PCN reaction occurring within the last 10 years: No If all of the above answers are NO, then may proceed with Cephalosporin use.    Pollen Extract Other (See Comments)    Congestion, Eye Irritation     REVIEW OF SYSTEMS (Negative unless checked)   Constitutional: [] Weight loss  [] Fever  [] Chills Cardiac: [] Chest pain   [] Chest pressure   [] Palpitations   [] Shortness of breath when laying flat   [] Shortness of breath at rest    [] Shortness of breath with exertion. Vascular:  [x] Pain in legs with walking   [] Pain in legs at rest   [] Pain in legs when laying flat   [x] Claudication   [] Pain in feet when walking  [] Pain in feet at rest  [] Pain in feet when laying flat   [] History of DVT   [] Phlebitis   [] Swelling in legs   [] Varicose veins   [] Non-healing ulcers Pulmonary:   [] Uses home oxygen    [] Productive cough   [] Hemoptysis   [] Wheeze  [] COPD   [] Asthma Neurologic:  [] Dizziness  [] Blackouts   [] Seizures   [] History of stroke   [] History of TIA  [] Aphasia   [] Temporary blindness   [] Dysphagia   [] Weakness or numbness in arms   [] Weakness or numbness in legs Musculoskeletal:  [x] Arthritis   [] Joint swelling   [x] Joint pain   []   Low back pain Hematologic:  [] Easy bruising  [] Easy bleeding   [] Hypercoagulable state   [x] Anemic   Gastrointestinal:  [] Blood in stool   [] Vomiting blood  [x] Gastroesophageal reflux/heartburn   [] Abdominal pain Genitourinary:  [] Chronic kidney disease   [] Difficult urination  [] Frequent urination  [] Burning with urination   [] Hematuria Skin:  [] Rashes   [] Ulcers   [] Wounds Psychological:  [x] History of anxiety   []  History of major depression.  Physical Examination  There were no vitals taken for this visit. Gen:  WD/WN, NAD Head: [place]/AT, No temporalis wasting. Ear/Nose/Throat: Hearing grossly intact, nares w/o erythema or drainage Eyes: Conjunctiva clear. Sclera non-icteric Neck: Supple.  Trachea midline Pulmonary:  Good air movement, no use of accessory muscles.  Cardiac: RRR, no JVD Vascular:  Vessel Right Left  Radial Palpable Palpable                          PT Palpable Palpable  DP Palpable Palpable   Gastrointestinal: soft, non-tender/non-distended. No guarding/reflex.  Musculoskeletal: M/S 5/5 throughout.  No deformity or atrophy. *** edema. Neurologic: Sensation grossly intact in extremities.  Symmetrical.  Speech is fluent.  Psychiatric: Judgment intact, Mood & affect  appropriate for pt's clinical situation. Dermatologic: No rashes or ulcers noted.  No cellulitis or open wounds.      Labs No results found for this or any previous visit (from the past 2160 hours).  Radiology No results found.  Assessment/Plan  No problem-specific Assessment & Plan notes found for this encounter.  Hypertension blood pressure control important in reducing the progression of atherosclerotic disease. On appropriate oral medications.     Hyperlipidemia lipid control important in reducing the progression of atherosclerotic disease. Continue statin therapy  Selinda Gu, MD  07/21/2024 2:15 PM    This note was created with Dragon medical transcription system.  Any errors from dictation are purely unintentional

## 2024-07-24 NOTE — Assessment & Plan Note (Signed)
 She had noninvasive studies performed about 4 months ago which showed a right ABI of 0.72 which was stable to slightly improved from her previous studies.  Her digit pressure on that side was 53.  Her left ABI was 0.85 with a digit pressure of 54 which was also stable.  She reports a history of left leg DVT.  However, in reviewing all of her notes I see no venous duplex that was actually positive for DVT.  On those studies, note was only made of a left SFA occlusion which has subsequently been treated.  She has been on Eliquis  for refractory and difficult peripheral arterial disease requiring reintervention, not for DVT. I will contact her orthopedic surgeon.  She does have a chronic right SFA occlusion, but should have adequate blood flow for healing of the right total knee replacement.  This has been longstanding and well collateralized for many years. As for the possibility of a DVT and placement of the filter, when she was actually in office and there was a reported history of DVT we did discuss placement of the filter.  However in reviewing her studies, I am not sure that she has ever actually had a venous thrombosis.  I think the blood clot she speaks of is in the arterial circuit on the left leg prior to intervention.  As such, I am not sure that an IVC filter is going to be of much benefit.  Again, I will contact her orthopedic surgeon and we can discuss the case to determine best action going forward.

## 2024-09-05 NOTE — Discharge Instructions (Signed)

## 2024-09-08 ENCOUNTER — Encounter
Admission: RE | Admit: 2024-09-08 | Discharge: 2024-09-08 | Disposition: A | Discharge status: Home / Self Care | Source: Ambulatory Visit | Attending: Orthopedic Surgery | Admitting: Orthopedic Surgery

## 2024-09-08 ENCOUNTER — Inpatient Hospital Stay: Admission: RE | Admit: 2024-09-08 | Source: Ambulatory Visit

## 2024-09-08 ENCOUNTER — Other Ambulatory Visit: Payer: Self-pay

## 2024-09-08 VITALS — BP 129/61 | HR 60 | Temp 98.0°F | Resp 16 | Ht 64.0 in | Wt 138.3 lb

## 2024-09-08 DIAGNOSIS — M1711 Unilateral primary osteoarthritis, right knee: Secondary | ICD-10-CM | POA: Diagnosis not present

## 2024-09-08 DIAGNOSIS — I10 Essential (primary) hypertension: Secondary | ICD-10-CM | POA: Diagnosis not present

## 2024-09-08 DIAGNOSIS — K922 Gastrointestinal hemorrhage, unspecified: Secondary | ICD-10-CM | POA: Diagnosis not present

## 2024-09-08 DIAGNOSIS — Z01818 Encounter for other preprocedural examination: Secondary | ICD-10-CM | POA: Insufficient documentation

## 2024-09-08 DIAGNOSIS — I739 Peripheral vascular disease, unspecified: Secondary | ICD-10-CM | POA: Insufficient documentation

## 2024-09-08 DIAGNOSIS — Z880 Allergy status to penicillin: Secondary | ICD-10-CM

## 2024-09-08 HISTORY — DX: Spondylosis, unspecified: M47.9

## 2024-09-08 LAB — CBC
HCT: 33.5 % — ABNORMAL LOW (ref 36.0–46.0)
Hemoglobin: 12.4 g/dL (ref 12.0–15.0)
MCH: 31.2 pg (ref 26.0–34.0)
MCHC: 37 g/dL — ABNORMAL HIGH (ref 30.0–36.0)
MCV: 84.4 fL (ref 80.0–100.0)
Platelets: 211 K/uL (ref 150–400)
RBC: 3.97 MIL/uL (ref 3.87–5.11)
RDW: 12.9 % (ref 11.5–15.5)
WBC: 5.6 K/uL (ref 4.0–10.5)
nRBC: 0 % (ref 0.0–0.2)

## 2024-09-08 LAB — COMPREHENSIVE METABOLIC PANEL WITH GFR
ALT: 18 U/L (ref 0–44)
AST: 22 U/L (ref 15–41)
Albumin: 4.1 g/dL (ref 3.5–5.0)
Alkaline Phosphatase: 35 U/L — ABNORMAL LOW (ref 38–126)
Anion gap: 13 (ref 5–15)
BUN: 22 mg/dL (ref 8–23)
CO2: 26 mmol/L (ref 22–32)
Calcium: 8.8 mg/dL — ABNORMAL LOW (ref 8.9–10.3)
Chloride: 102 mmol/L (ref 98–111)
Creatinine, Ser: 0.78 mg/dL (ref 0.44–1.00)
GFR, Estimated: >60 mL/min (ref >60)
Glucose, Bld: 113 mg/dL — ABNORMAL HIGH (ref 70–99)
Potassium: 3 mmol/L — ABNORMAL LOW (ref 3.5–5.1)
Sodium: 141 mmol/L (ref 135–145)
Total Bilirubin: 0.9 mg/dL (ref 0.0–1.2)
Total Protein: 7.3 g/dL (ref 6.5–8.1)

## 2024-09-08 LAB — URINALYSIS, ROUTINE W REFLEX MICROSCOPIC
Bilirubin Urine: NEGATIVE
Glucose, UA: NEGATIVE mg/dL
Hgb urine dipstick: NEGATIVE
Ketones, ur: NEGATIVE mg/dL
Leukocytes,Ua: NEGATIVE
Nitrite: NEGATIVE
Protein, ur: NEGATIVE mg/dL
Specific Gravity, Urine: 1.018 (ref 1.005–1.030)
pH: 5 (ref 5.0–8.0)

## 2024-09-08 LAB — C-REACTIVE PROTEIN: CRP: 0.6 mg/dL (ref <1.0)

## 2024-09-08 LAB — SURGICAL PCR SCREEN
MRSA, PCR: POSITIVE — AB
Staphylococcus aureus: POSITIVE — AB

## 2024-09-08 LAB — SEDIMENTATION RATE: Sed Rate: 5 mm/h (ref 0–30)

## 2024-09-08 NOTE — Patient Instructions (Addendum)
 Your procedure is scheduled on: FRIDAY 09/18/24 Report to the Registration Desk on the 1st floor of the [facility]. To find out your arrival time, please call [phone] between 1PM - 3PM on: THURSDAY 09/17/24 If your arrival time is 6:00 am, do not arrive before that time as the [facility] entrance doors do not open until 6:00 am.  REMEMBER: Instructions that are not followed completely may result in serious medical risk, up to and including death; or upon the discretion of your surgeon and anesthesiologist your surgery may need to be rescheduled.  Do not eat food after midnight the night before surgery.  No gum chewing or hard candies.  You may however, drink CLEAR liquids up to 2 hours before you are scheduled to arrive for your surgery. Do not drink anything within 2 hours of your scheduled arrival time.  Clear liquids include: - water   - apple juice without pulp - gatorade (not RED colors) - black coffee or tea (Do NOT add milk or creamers to the coffee or tea) Do NOT drink anything that is not on this list.  In addition, your doctor has ordered for you to drink the provided:  Ensure Pre-Surgery Clear Carbohydrate Drink  Drinking this carbohydrate drink up to two hours before surgery helps to reduce insulin resistance and improve patient outcomes. Please complete drinking 2 hours before scheduled arrival time.  One week prior to surgery: Stop Anti-inflammatories (NSAIDS) such as Advil, Aleve, Ibuprofen, Motrin, Naproxen, Naprosyn and Aspirin  based products such as Excedrin, Goody's Powder, BC Powder. Stop ANY OVER THE COUNTER supplements until after surgery.  You may however, continue to take Tylenol  if needed for pain up until the day of surgery.  STOP ELIQUIS  3 days prior to surgery.  Last Dose 09/14/24  Continue taking all of your other prescription medications up until the day of surgery.  ON THE DAY OF SURGERY ONLY TAKE THESE MEDICATIONS WITH SIPS OF  WATER :  amLODipine  (NORVASC )  atorvastatin  (LIPITOR)  levothyroxine  (SYNTHROID )  pantoprazole  (PROTONIX )   Use inhalers on the day of surgery and bring to the hospital.  No Alcohol for 24 hours before or after surgery.  No Smoking including e-cigarettes for 24 hours before surgery.  No chewable tobacco products for at least 6 hours before surgery.  No nicotine  patches on the day of surgery.  Do not use any recreational drugs for at least a week (preferably 2 weeks) before your surgery.  Please be advised that the combination of cocaine and anesthesia may have negative outcomes, up to and including death. If you test positive for cocaine, your surgery will be cancelled.  On the morning of surgery brush your teeth with toothpaste and water , you may rinse your mouth with mouthwash if you wish. Do not swallow any toothpaste or mouthwash.  Use CHG Soap or wipes as directed on instruction sheet.  Do not wear jewelry, make-up, hairpins, clips or nail polish.  For welded (permanent) jewelry: bracelets, anklets, waist bands, etc.  Please have this removed prior to surgery.  If it is not removed, there is a chance that hospital personnel will need to cut it off on the day of surgery.  Do not wear lotions, powders, or perfumes.   Do not shave body hair from the neck down 48 hours before surgery.  Contact lenses, hearing aids and dentures may not be worn into surgery.  Do not bring valuables to the hospital. [facility] is not responsible for any missing/lost belongings or valuables.  Bring your C-PAP to the hospital in case you may have to spend the night.   Notify your doctor if there is any change in your medical condition (cold, fever, infection).  Wear comfortable clothing (specific to your surgery type) to the hospital.  After surgery, you can help prevent lung complications by doing breathing exercises.  Take deep breaths and cough every 1-2 hours. Your doctor may order a  device called an Incentive Spirometer to help you take deep breaths. When coughing or sneezing, hold a pillow firmly against your incision with both hands. This is called "splinting." Doing this helps protect your incision. It also decreases belly discomfort.  If you are being admitted to the hospital overnight, leave your suitcase in the car. After surgery it may be brought to your room.  In case of increased patient census, it may be necessary for you, the patient, to continue your postoperative care in the Same Day Surgery department.  If you are being discharged the day of surgery, you will not be allowed to drive home. You will need a responsible individual to drive you home and stay with you for 24 hours after surgery.   If you are taking public transportation, you will need to have a responsible individual with you.  Please call the Pre-admissions Testing Dept. at [phone] if you have any questions about these instructions.  Surgery Visitation Policy:  Patients having surgery or a procedure may have two visitors.  Children under the age of 26 must have an adult with them who is not the patient.  Inpatient Visitation:    Visiting hours are 7 a.m. to 8 p.m. Up to four visitors are allowed at one time in a patient room. The visitors may rotate out with other people during the day.  One visitor age 63 or older may stay with the patient overnight and must be in the room by 8 p.m.   Merchandiser, retail to address health-related social needs:  [email or web address]    Pre-operative 4 CHG Bath Instructions   You can play a key role in reducing the risk of infection after surgery. Your skin needs to be as free of germs as possible. You can reduce the number of germs on your skin by washing with CHG (chlorhexidine  gluconate) soap before surgery. CHG is an antiseptic soap that kills germs and continues to kill germs even after washing.   DO NOT use if you have  an allergy to chlorhexidine /CHG or antibacterial soaps. If your skin becomes reddened or irritated, stop using the CHG and notify one of our RNs at [phone].   Please shower with the CHG soap starting 4 days before surgery using the following schedule:     Please keep in mind the following:  DO NOT shave, including legs and underarms, starting the day of your first shower.   You may shave your face at any point before/day of surgery.  Place clean sheets on your bed the day you start using CHG soap. Use a clean washcloth (not used since being washed) for each shower. DO NOT sleep with pets once you start using the CHG.   CHG Shower Instructions:  If you choose to wash your hair and private area, wash first with your normal shampoo/soap.  After you use shampoo/soap, rinse your hair and body thoroughly to remove shampoo/soap residue.  Turn the water  OFF and apply about 3 tablespoons (45 ml) of CHG soap to a CLEAN washcloth.  Apply CHG soap ONLY FROM  YOUR NECK DOWN TO YOUR TOES (washing for 3-5 minutes)  DO NOT use CHG soap on face, private areas, open wounds, or sores.  Pay special attention to the area where your surgery is being performed.  If you are having back surgery, having someone wash your back for you may be helpful. Wait 2 minutes after CHG soap is applied, then you may rinse off the CHG soap.  Pat dry with a clean towel  Put on clean clothes/pajamas   If you choose to wear lotion, please use ONLY the CHG-compatible lotions on the back of this paper.     Additional instructions for the day of surgery: DO NOT APPLY any lotions, deodorants, cologne, or perfumes.   Put on clean/comfortable clothes.  Brush your teeth.  Ask your nurse before applying any prescription medications to the skin.      CHG Compatible Lotions   Aveeno Moisturizing lotion  Cetaphil Moisturizing Cream  Cetaphil Moisturizing Lotion  Clairol Herbal Essence Moisturizing Lotion, Dry Skin  Clairol  Herbal Essence Moisturizing Lotion, Extra Dry Skin  Clairol Herbal Essence Moisturizing Lotion, Normal Skin  Curel Age Defying Therapeutic Moisturizing Lotion with Alpha Hydroxy  Curel Extreme Care Body Lotion  Curel Soothing Hands Moisturizing Hand Lotion  Curel Therapeutic Moisturizing Cream, Fragrance-Free  Curel Therapeutic Moisturizing Lotion, Fragrance-Free  Curel Therapeutic Moisturizing Lotion, Original Formula  Eucerin Daily Replenishing Lotion  Eucerin Dry Skin Therapy Plus Alpha Hydroxy Crme  Eucerin Dry Skin Therapy Plus Alpha Hydroxy Lotion  Eucerin Original Crme  Eucerin Original Lotion  Eucerin Plus Crme Eucerin Plus Lotion  Eucerin TriLipid Replenishing Lotion  Keri Anti-Bacterial Hand Lotion  Keri Deep Conditioning Original Lotion Dry Skin Formula Softly Scented  Keri Deep Conditioning Original Lotion, Fragrance Free Sensitive Skin Formula  Keri Lotion Fast Absorbing Fragrance Free Sensitive Skin Formula  Keri Lotion Fast Absorbing Softly Scented Dry Skin Formula  Keri Original Lotion  Keri Skin Renewal Lotion Keri Silky Smooth Lotion  Keri Silky Smooth Sensitive Skin Lotion  Nivea Body Creamy Conditioning Oil  Nivea Body Extra Enriched Lotion  Nivea Body Original Lotion  Nivea Body Sheer Moisturizing Lotion Nivea Crme  Nivea Skin Firming Lotion  NutraDerm 30 Skin Lotion  NutraDerm Skin Lotion  NutraDerm Therapeutic Skin Cream  NutraDerm Therapeutic Skin Lotion  ProShield Protective Hand Cream  Provon moisturizing lotion     Preoperative Educational Videos for Total Hip, Knee and Shoulder Replacements  To better prepare for surgery, please view our videos that explain the physical activity and discharge planning required to have the best surgical recovery at [facility].  [email or web address]  Questions? Call [phone] or email [email or web address]

## 2024-09-08 NOTE — Progress Notes (Addendum)
  Perioperative Services  Abnormal Lab Notification and Treatment Plan of Care  Date: 09/08/24  Name: Lindsay [name] DOB: 1941/07/17 MRN:   [id number]  Re: Abnormal labs noted during PAT appointment  Provider Notified: Mardee Lynwood SQUIBB, MD Notification mode: Routed and/or faxed via [facility]  Labs of concern: Lab Results  Component Value Date   STAPHAUREUS POSITIVE (A) 09/08/2024   MRSAPCR POSITIVE (A) 09/08/2024    Notes: Patient is scheduled for a ARTHROPLASTY, KNEE, TOTAL, USING IMAGELESS COMPUTER-ASSISTED NAVIGATION (Right: Knee) on 09/18/2024. She is scheduled to receive CEFAZOLIN  pre-operatively. Pre-surgical PCR (+) for MRSA; see above.  PLANS:  Lindsay [name] is scheduled for the above procedure with Dr. Lynwood Mardee, MD.  Preoperative testing revealed a (+) MRSA PCR screen. Patient will need additional preoperative antimicrobial coverage for this pathogen.  Sending result to primary attending surgeon for review.  Surgeon to place further orders as deemed appropriate for this case.  Patient's medical history updated in EMR to reflect today's (+) result. No further needs from the PAT department identified at this time.  Dorise Pereyra, MSN, APRN, FNP-C, CEN [facility]  Perioperative Services Nurse Practitioner Phone: [phone] 09/08/24 1:57 PM

## 2024-09-08 NOTE — Progress Notes (Signed)
  [facility] Perioperative Services: Pre-Admission/Anesthesia Testing  Abnormal Lab Notification and Treatment Plan of Care   Date: 09/08/24  Name: Lindsay [name] DOB: 1941-05-21 MRN:   [id number]  Re: Abnormal labs noted during PAT appointment   Notified:  Provider Name Provider Role Notification Mode  Mardee Agent, MD Orthopedics (Surgeon) Routed and/or faxed via [facility]   Clinical Information and Notes:  ABNORMAL LAB VALUE(S): Lab Results  Component Value Date   K 3.0 (L) 09/08/2024   Lindsay [name] is scheduled for an elective ARTHROPLASTY, KNEE, TOTAL, USING IMAGELESS COMPUTER-ASSISTED NAVIGATION (Right: Knee) on 09/18/2024. In review of her medication reconciliation, it is noted that the patient is taking prescribed diuretic medications (HCTZ 6.25 mg) daily.   Please note, in efforts to promote a safe and effective anesthetic course, per current guidelines/standards set by the [facility] anesthesia team, the minimal acceptable K+ level for the patient to proceed with general anesthesia is 3.0 mmol/L. With that being said, if the patient drops any lower, her elective procedure will need to be postponed until K+ is better optimized.   Sending result to surgeon for review and optimization as deemed appropriate. No further needs from the PAT department identified at this time.  Dorise Pereyra, MSN, APRN, FNP-C, CEN [facility]  Perioperative Services Nurse Practitioner Phone: [phone] 09/08/24 1:38 PM  NOTE: This note has been prepared using Dragon dictation software. Despite my best ability to proofread, there is always the potential that unintentional transcriptional errors may still occur from this process.

## 2024-09-10 LAB — IGE: IgE (Immunoglobulin E), Serum: 134 [IU]/mL (ref 6–495)

## 2024-09-14 ENCOUNTER — Encounter: Payer: Self-pay | Admitting: Orthopedic Surgery

## 2024-09-17 ENCOUNTER — Encounter: Payer: Self-pay | Admitting: Orthopedic Surgery

## 2024-09-17 NOTE — H&P (Signed)
 ORTHOPAEDIC HISTORY & PHYSICAL Lindsay [name], [place] - 09/14/2024 3:45 PM EDT Formatting of this note is different from the original. NAME: Lindsay [name] H&P Date: 09/14/2024 Procedure Date: 09/18/2024  Chief Complaint: right knee pain  HPI Lindsay [name] is a 83 y.o. female who has severe Right knee pain. Patient reports a several year history of right knee pain that has progressively gotten worse. She does report a history of a knee arthroscopy and debridement that was performed by Dr. Cleotilde in 2016. The patient reports that most of her pain localizes along the lateral aspect of her knee. She states that it is made worse with prolonged standing and ambulation. She denies any locking or maltracking symptoms. She does report intermittent swelling and feelings of giving way of the knee. It greatly affects her ability to ambulate long distances and perform her ADLs as she would like. She has failed conservative treatment including Tylenol , intra-articular corticosteroid injections, lidocaine  patches, knee sleeves, narcotic analgesics and viscosupplementation injections. She is unable to tolerate oral NSAIDs secondary to previous GI bleeds. She does not currently utilize any ambulatory aids. She has requested operative intervention for relief of her DJD symptoms. Of note, patient denies any previous cardiac or pulmonary history. She is not a diabetic. She does have a history of a previous DVT that has affected her left leg and she currently takes Eliquis . She does also report a history of an arthroscopy right knee procedure performed by Dr. Cleotilde in 2016 as described above. Patient also sees Dr. Marea for peripheral vascular disease with claudication.  Of note, patient was found to have low potassium at her preoperative hospital appointment for which her PCP has encouraged her to utilize OTC potassium supplementation.  Social Hx: Patient lives at home alone and works as a part-time Mining engineer. She states that her granddaughter will be helping look after her postoperatively. She does admit to 1 standard alcohol drink per week. Denies any illicit drug use. Does admit to smoking, states that she has about 1/2 pack a day, but has slowed down over the last few weeks to prepare for her upcoming surgery.  Medications & Allergies Allergies: Allergies Allergen Reactions Nsaids (Non-Steroidal Anti-Inflammatory Drug) Other (See Comments) GI BLEED Aspirin  Other (See Comments) GI BLEED  Bee Pollen Other (See Comments) Penicillins Itching, Other (See Comments) and Rash Has patient had a PCN reaction causing immediate rash, facial/tongue/throat swelling, SOB or lightheadedness with hypotension: Yes Has patient had a PCN reaction causing severe rash involving mucus membranes or skin necrosis: No Has patient had a PCN reaction that required hospitalization No Has patient had a PCN reaction occurring within the last 10 years: No If all of the above answers are NO, then may proceed with Cephalosporin use.   Tolmetin Other (See Comments) GI BLEED   Home Medicines: Current Outpatient Medications on File Prior to Visit Medication Sig Dispense Refill acetaminophen  (TYLENOL ) 500 MG tablet Take 1,000 mg by mouth every 8 (eight) hours as needed ALPRAZolam  (XANAX ) 0.25 MG tablet Take 0.25 mg by mouth 2 (two) times daily as needed amLODIPine  (NORVASC ) 10 MG tablet TAKE 1 TABLET BY MOUTH DAILY 100 tablet 1 atorvastatin  (LIPITOR) 10 MG tablet TAKE 1 TABLET BY MOUTH DAILY 100 tablet 1 bisoproloL -hydroCHLOROthiazide  (ZIAC ) 10-6.25 mg tablet TAKE 1 TABLET BY MOUTH DAILY 100 tablet 1 COMBIGAN  0.2-0.5 % ophthalmic solution Place 1 drop into both eyes every 12 (twelve) hours ELIQUIS  5 mg tablet Take 1 tablet by mouth 2 (two) times daily levothyroxine  (  SYNTHROID ) 25 MCG tablet TAKE 1 TABLET BY MOUTH ONCE DAILY EXCEPTWEDNESDAY AND SUNDAY TAKE 1/2 TABLET 100 tablet 1 losartan  (COZAAR ) 100 MG  tablet TAKE 1 TABLET BY MOUTH DAILY 100 tablet 1 multivitamin with minerals tablet Take 1 tablet by mouth once daily pantoprazole  (PROTONIX ) 20 MG DR tablet TAKE 1 TABLET BY MOUTH DAILY 90 tablet 1  No current facility-administered medications on file prior to visit.  Medical / Surgical History  Past Medical History: Diagnosis Date Allergic state Chicken pox Depression GERD (gastroesophageal reflux disease) Hyperlipidemia Hypertension   Past Surgical History: Procedure Laterality Date CATARACT EXTRACTION Bilateral HYSTERECTOMY   Physical Exam  Ht:165.1 cm (5' 5) Wt:64.2 kg (141 lb 8 oz) BMI: Body mass index is 23.55 kg/m.  General/Constitutional: No apparent distress: well-nourished and well developed. Eyes: Pupils equal, round with synchronous movement. Lymphatic: No palpable adenopathy. Respiratory: Patient has good chest rise and fall with inspiration and expiration. All lung fields are clear to auscultation bilaterally. There is no Rales, rhonchi or wheezes appreciated. Cardiovascular: Upon auscultation there is a regular rate and rhythm without any murmurs, rubs, gallops or heaves appreciated. There does not appear to be any swelling down the lower extremities. Posterior tibial pulses appreciated bilaterally, 2+. Integumentary: No impressive skin lesions present, except as noted in detailed exam. Neuro/Psych: Normal mood and affect, oriented to person, place and time. Musculoskeletal: see exam below  Right knee exam Upon inspection of the patient's right knee there does not appear to be any skin changes, open abrasions, or redness. Mild swelling appreciated. Patient noted to have well-healed small port incisions along the anterior portion of her knee consistent with her history of arthroscopic procedure. There is a valgus alignment. Upon palpation, the patient reports having pain along the lateral aspect of their knee. Patient has 2 degrees off of full extension actively  with ROM, and able to flex back to 122 degrees with mild pain. Varus and valgus stress testing shows positive pseudolaxity to varus stressing. The patella tracks well within the femoral groove from flexion into extension with noticeable crepitus. Anterior and posterior drawer testing negative. Patient is neurovascularly intact down their lower extremity to all dermatomes. Posterior tibial pulses appreciated 2+.  Imaging right Knee Imaging: A series of x-rays were ordered and interpreted of the patient's right knee. These images included AP weightbearing, lateral and sunrise views. Upon inspection, there does appear to be noticeable loss of joint space, notably along the lateral margins with approximately 80% joint space narrowing. Subchondral sclerosis is noted. Osteophyte formation is present. Overall alignment is relative valgus. No fractures, lytic lesions or gross deformities appreciated on films.  Assesment and Plan Knee DJD  I have recommended that Lindsay [name] undergo right total knee replacement. Consents has been signed. The risks, benefits, prognosis and alternatives including but not limited to DVT, PE, infection, neurovascular injury, failure of the procedure and death were explained to the patient and she is willing to proceed with surgery as described to her by myself. Plan will be for post operative admission of at least 1 midnight for pain control and PT. She will be managed with DVT prophylaxis, antibiotics preoperatively for 24 hours and aggressive in patient rehab.  Pre, intra and post op interventions were discussed. Patient has good understanding  Medication Reconciliation was performed. Discussed cessation of Eliquis , vitamins and supplements. Patient will continue with her potassium supplementation as directed up until the day before surgery.  A total of 45 minutes was spent reviewing patient's charts, medical reconciliation,  discussing/educating the patient about surgical  interventions, and answering any questions provided by the patient.  JOSHUA DALLAS KOYANAGI, PA [facility] 09/14/2024  Electronically signed by KOYANAGI Fonda DALLAS, PA at 09/14/2024 5:54 PM EDT

## 2024-09-18 ENCOUNTER — Observation Stay
Admission: RE | Admit: 2024-09-18 | Discharge: 2024-09-19 | Disposition: A | Discharge status: Home / Self Care | Attending: Orthopedic Surgery | Admitting: Orthopedic Surgery

## 2024-09-18 ENCOUNTER — Encounter: Payer: Self-pay | Admitting: Orthopedic Surgery

## 2024-09-18 ENCOUNTER — Ambulatory Visit: Payer: Self-pay | Admitting: Urgent Care

## 2024-09-18 ENCOUNTER — Encounter
Admission: RE | Disposition: A | Discharge status: Home / Self Care | Payer: Self-pay | Source: Home / Self Care | Attending: Orthopedic Surgery

## 2024-09-18 ENCOUNTER — Observation Stay

## 2024-09-18 ENCOUNTER — Ambulatory Visit: Payer: Self-pay | Admitting: Anesthesiology

## 2024-09-18 ENCOUNTER — Other Ambulatory Visit: Payer: Self-pay

## 2024-09-18 DIAGNOSIS — Z880 Allergy status to penicillin: Secondary | ICD-10-CM

## 2024-09-18 DIAGNOSIS — K922 Gastrointestinal hemorrhage, unspecified: Secondary | ICD-10-CM

## 2024-09-18 DIAGNOSIS — I10 Essential (primary) hypertension: Secondary | ICD-10-CM | POA: Insufficient documentation

## 2024-09-18 DIAGNOSIS — F17210 Nicotine dependence, cigarettes, uncomplicated: Secondary | ICD-10-CM | POA: Insufficient documentation

## 2024-09-18 DIAGNOSIS — I739 Peripheral vascular disease, unspecified: Secondary | ICD-10-CM

## 2024-09-18 DIAGNOSIS — E039 Hypothyroidism, unspecified: Secondary | ICD-10-CM | POA: Insufficient documentation

## 2024-09-18 DIAGNOSIS — Z7901 Long term (current) use of anticoagulants: Secondary | ICD-10-CM | POA: Insufficient documentation

## 2024-09-18 DIAGNOSIS — M25561 Pain in right knee: Secondary | ICD-10-CM | POA: Diagnosis present

## 2024-09-18 DIAGNOSIS — M1711 Unilateral primary osteoarthritis, right knee: Principal | ICD-10-CM | POA: Insufficient documentation

## 2024-09-18 DIAGNOSIS — Z96651 Presence of right artificial knee joint: Secondary | ICD-10-CM

## 2024-09-18 DIAGNOSIS — Z79899 Other long term (current) drug therapy: Secondary | ICD-10-CM | POA: Diagnosis not present

## 2024-09-18 LAB — POCT I-STAT, CHEM 8
BUN: 8 mg/dL (ref 8–23)
Calcium, Ion: 1.2 mmol/L (ref 1.15–1.40)
Chloride: 104 mmol/L (ref 98–111)
Creatinine, Ser: 0.7 mg/dL (ref 0.44–1.00)
Glucose, Bld: 96 mg/dL (ref 70–99)
HCT: 40 % (ref 36.0–46.0)
Hemoglobin: 13.6 g/dL (ref 12.0–15.0)
Potassium: 3.1 mmol/L — ABNORMAL LOW (ref 3.5–5.1)
Sodium: 144 mmol/L (ref 135–145)
TCO2: 24 mmol/L (ref 22–32)

## 2024-09-18 SURGERY — ARTHROPLASTY, KNEE, TOTAL, USING IMAGELESS COMPUTER-ASSISTED NAVIGATION
Anesthesia: Spinal | Site: Knee | Laterality: Right

## 2024-09-18 MED ORDER — EPHEDRINE 5 MG/ML INJ
INTRAVENOUS | Status: AC
  Filled 2024-09-18: qty 5

## 2024-09-18 MED ORDER — DEXAMETHASONE SOD PHOSPHATE PF 10 MG/ML IJ SOLN
8.0000 mg | Freq: Once | INTRAMUSCULAR | Status: AC
  Administered 2024-09-18: 8 mg via INTRAVENOUS

## 2024-09-18 MED ORDER — FENTANYL CITRATE (PF) 100 MCG/2ML IJ SOLN: 25.0000 ug | INTRAMUSCULAR | Status: DC | PRN

## 2024-09-18 MED ORDER — SODIUM CHLORIDE (PF) 0.9 % IJ SOLN
INTRAMUSCULAR | Status: AC
  Filled 2024-09-18: qty 80

## 2024-09-18 MED ORDER — APIXABAN 5 MG PO TABS
5.0000 mg | ORAL_TABLET | Freq: Two times a day (BID) | ORAL | Status: DC
  Administered 2024-09-19: 5 mg via ORAL
  Filled 2024-09-18: qty 1

## 2024-09-18 MED ORDER — SODIUM CHLORIDE 0.9 % IR SOLN
Status: DC | PRN
  Administered 2024-09-18: 3000 mL

## 2024-09-18 MED ORDER — CELECOXIB 200 MG PO CAPS
400.0000 mg | ORAL_CAPSULE | Freq: Once | ORAL | Status: AC
  Administered 2024-09-18: 400 mg via ORAL

## 2024-09-18 MED ORDER — CHLORHEXIDINE GLUCONATE 4 % EX SOLN: 60.0000 mL | Freq: Once | CUTANEOUS | Status: DC

## 2024-09-18 MED ORDER — BRIMONIDINE TARTRATE-TIMOLOL 0.2-0.5 % OP SOLN: 1.0000 [drp] | Freq: Two times a day (BID) | OPHTHALMIC | Status: DC

## 2024-09-18 MED ORDER — BISACODYL 10 MG RE SUPP: 10.0000 mg | Freq: Every day | RECTAL | Status: DC | PRN

## 2024-09-18 MED ORDER — AMLODIPINE BESYLATE 10 MG PO TABS
10.0000 mg | ORAL_TABLET | Freq: Every day | ORAL | Status: DC
  Administered 2024-09-19: 10 mg via ORAL
  Filled 2024-09-18: qty 1

## 2024-09-18 MED ORDER — DIPHENHYDRAMINE HCL 12.5 MG/5ML PO ELIX: 12.5000 mg | ORAL_SOLUTION | ORAL | Status: DC | PRN

## 2024-09-18 MED ORDER — OXYCODONE HCL 5 MG PO TABS: 5.0000 mg | ORAL_TABLET | ORAL | Status: DC | PRN

## 2024-09-18 MED ORDER — CEFAZOLIN SODIUM-DEXTROSE 2-4 GM/100ML-% IV SOLN
2.0000 g | Freq: Four times a day (QID) | INTRAVENOUS | Status: AC
  Administered 2024-09-18 (×2): 2 g via INTRAVENOUS
  Filled 2024-09-18 (×2): qty 100

## 2024-09-18 MED ORDER — OXYCODONE HCL 5 MG PO TABS: 5.0000 mg | ORAL_TABLET | Freq: Once | ORAL | Status: DC | PRN

## 2024-09-18 MED ORDER — LOSARTAN POTASSIUM 50 MG PO TABS
100.0000 mg | ORAL_TABLET | Freq: Every day | ORAL | Status: DC
  Administered 2024-09-18 – 2024-09-19 (×2): 100 mg via ORAL
  Filled 2024-09-18 (×2): qty 2

## 2024-09-18 MED ORDER — SURGIPHOR WOUND IRRIGATION SYSTEM - OPTIME
TOPICAL | Status: DC | PRN
  Administered 2024-09-18: 450 mL

## 2024-09-18 MED ORDER — FERROUS SULFATE 325 (65 FE) MG PO TABS
325.0000 mg | ORAL_TABLET | Freq: Two times a day (BID) | ORAL | Status: DC
  Administered 2024-09-18 – 2024-09-19 (×2): 325 mg via ORAL
  Filled 2024-09-18 (×2): qty 1

## 2024-09-18 MED ORDER — CELECOXIB 200 MG PO CAPS
ORAL_CAPSULE | ORAL | Status: AC
  Filled 2024-09-18: qty 1

## 2024-09-18 MED ORDER — EPHEDRINE SULFATE-NACL 50-0.9 MG/10ML-% IV SOSY: PREFILLED_SYRINGE | INTRAVENOUS | Status: DC | PRN

## 2024-09-18 MED ORDER — MIDAZOLAM HCL 5 MG/5ML IJ SOLN
INTRAMUSCULAR | Status: DC | PRN
  Administered 2024-09-18 (×2): 1 mg via INTRAVENOUS

## 2024-09-18 MED ORDER — TRANEXAMIC ACID-NACL 1000-0.7 MG/100ML-% IV SOLN
1000.0000 mg | INTRAVENOUS | Status: AC
  Administered 2024-09-18: 1000 mg via INTRAVENOUS

## 2024-09-18 MED ORDER — OXYCODONE HCL 5 MG PO TABS
10.0000 mg | ORAL_TABLET | ORAL | Status: DC | PRN
  Administered 2024-09-18: 10 mg via ORAL
  Filled 2024-09-18: qty 2

## 2024-09-18 MED ORDER — MAGNESIUM HYDROXIDE 400 MG/5ML PO SUSP
30.0000 mL | Freq: Every day | ORAL | Status: DC
  Administered 2024-09-18 – 2024-09-19 (×2): 30 mL via ORAL
  Filled 2024-09-18 (×2): qty 30

## 2024-09-18 MED ORDER — BISOPROLOL FUMARATE 5 MG PO TABS
10.0000 mg | ORAL_TABLET | Freq: Every day | ORAL | Status: DC
  Administered 2024-09-18: 10 mg via ORAL
  Filled 2024-09-18: qty 2

## 2024-09-18 MED ORDER — BUPIVACAINE HCL (PF) 0.25 % IJ SOLN
INTRAMUSCULAR | Status: AC
  Filled 2024-09-18: qty 120

## 2024-09-18 MED ORDER — TRANEXAMIC ACID-NACL 1000-0.7 MG/100ML-% IV SOLN
INTRAVENOUS | Status: AC
  Filled 2024-09-18: qty 100

## 2024-09-18 MED ORDER — TRANEXAMIC ACID-NACL 1000-0.7 MG/100ML-% IV SOLN
1000.0000 mg | Freq: Once | INTRAVENOUS | Status: AC
  Administered 2024-09-18: 1000 mg via INTRAVENOUS

## 2024-09-18 MED ORDER — LIDOCAINE HCL (PF) 2 % IJ SOLN
INTRAMUSCULAR | Status: AC
  Filled 2024-09-18: qty 5

## 2024-09-18 MED ORDER — ONDANSETRON HCL 4 MG/2ML IJ SOLN: 4.0000 mg | Freq: Four times a day (QID) | INTRAMUSCULAR | Status: DC | PRN

## 2024-09-18 MED ORDER — CEFAZOLIN SODIUM-DEXTROSE 2-4 GM/100ML-% IV SOLN
2.0000 g | INTRAVENOUS | Status: AC
  Administered 2024-09-18: 2 g via INTRAVENOUS

## 2024-09-18 MED ORDER — HYDROCHLOROTHIAZIDE 12.5 MG PO TABS
6.2500 mg | ORAL_TABLET | Freq: Every day | ORAL | Status: DC
  Administered 2024-09-19: 6.25 mg via ORAL
  Filled 2024-09-18: qty 1

## 2024-09-18 MED ORDER — LEVOTHYROXINE SODIUM 25 MCG PO TABS
25.0000 ug | ORAL_TABLET | Freq: Every day | ORAL | Status: DC
  Administered 2024-09-19: 25 ug via ORAL
  Filled 2024-09-18: qty 1

## 2024-09-18 MED ORDER — CEFAZOLIN SODIUM-DEXTROSE 2-4 GM/100ML-% IV SOLN
INTRAVENOUS | Status: AC
  Filled 2024-09-18: qty 100

## 2024-09-18 MED ORDER — CHLORHEXIDINE GLUCONATE 0.12 % MT SOLN
OROMUCOSAL | Status: AC
  Filled 2024-09-18: qty 15

## 2024-09-18 MED ORDER — ACETAMINOPHEN 10 MG/ML IV SOLN
INTRAVENOUS | Status: AC
  Filled 2024-09-18: qty 100

## 2024-09-18 MED ORDER — BISOPROLOL-HYDROCHLOROTHIAZIDE 10-6.25 MG PO TABS: 1.0000 | ORAL_TABLET | Freq: Every day | ORAL | Status: DC

## 2024-09-18 MED ORDER — DROPERIDOL 2.5 MG/ML IJ SOLN: 0.6250 mg | Freq: Once | INTRAMUSCULAR | Status: DC | PRN

## 2024-09-18 MED ORDER — FENTANYL CITRATE (PF) 100 MCG/2ML IJ SOLN
INTRAMUSCULAR | Status: DC | PRN
  Administered 2024-09-18 (×2): 25 ug via INTRAVENOUS

## 2024-09-18 MED ORDER — PHENYLEPHRINE 80 MCG/ML (10ML) SYRINGE FOR IV PUSH (FOR BLOOD PRESSURE SUPPORT)
PREFILLED_SYRINGE | INTRAVENOUS | Status: DC | PRN
  Administered 2024-09-18 (×10): 80 ug via INTRAVENOUS

## 2024-09-18 MED ORDER — CHLORHEXIDINE GLUCONATE 4 % EX SOLN: 1.0000 | CUTANEOUS | 1 refills | Status: DC

## 2024-09-18 MED ORDER — ATORVASTATIN CALCIUM 10 MG PO TABS
10.0000 mg | ORAL_TABLET | Freq: Every day | ORAL | Status: DC
  Administered 2024-09-19: 10 mg via ORAL
  Filled 2024-09-18: qty 1

## 2024-09-18 MED ORDER — CHLORHEXIDINE GLUCONATE 0.12 % MT SOLN
15.0000 mL | Freq: Once | OROMUCOSAL | Status: AC
  Administered 2024-09-18: 15 mL via OROMUCOSAL

## 2024-09-18 MED ORDER — GABAPENTIN 300 MG PO CAPS
ORAL_CAPSULE | ORAL | Status: AC
  Filled 2024-09-18: qty 1

## 2024-09-18 MED ORDER — GABAPENTIN 300 MG PO CAPS
300.0000 mg | ORAL_CAPSULE | Freq: Once | ORAL | Status: AC
  Administered 2024-09-18: 300 mg via ORAL

## 2024-09-18 MED ORDER — PROPOFOL 500 MG/50ML IV EMUL
INTRAVENOUS | Status: DC | PRN
  Administered 2024-09-18: 40 ug/kg/min via INTRAVENOUS

## 2024-09-18 MED ORDER — TRAMADOL HCL 50 MG PO TABS
50.0000 mg | ORAL_TABLET | ORAL | Status: DC | PRN
  Administered 2024-09-18 – 2024-09-19 (×2): 50 mg via ORAL
  Filled 2024-09-18 (×2): qty 1

## 2024-09-18 MED ORDER — LIDOCAINE HCL (PF) 1 % IJ SOLN
INTRAMUSCULAR | Status: DC | PRN
  Administered 2024-09-18: 3 mL

## 2024-09-18 MED ORDER — SODIUM CHLORIDE 0.9 % IV SOLN
INTRAVENOUS | Status: DC
  Administered 2024-09-18: 1000 mL via INTRAVENOUS

## 2024-09-18 MED ORDER — PROPOFOL 10 MG/ML IV BOLUS
INTRAVENOUS | Status: DC | PRN
  Administered 2024-09-18: 20 mg via INTRAVENOUS

## 2024-09-18 MED ORDER — MENTHOL 3 MG MT LOZG: 1.0000 | LOZENGE | OROMUCOSAL | Status: DC | PRN

## 2024-09-18 MED ORDER — FENTANYL CITRATE (PF) 100 MCG/2ML IJ SOLN
INTRAMUSCULAR | Status: AC
  Filled 2024-09-18: qty 2

## 2024-09-18 MED ORDER — PHENOL 1.4 % MT LIQD: 1.0000 | OROMUCOSAL | Status: DC | PRN

## 2024-09-18 MED ORDER — ALUM & MAG HYDROXIDE-SIMETH 200-200-20 MG/5ML PO SUSP: 30.0000 mL | ORAL | Status: DC | PRN

## 2024-09-18 MED ORDER — GLYCOPYRROLATE 0.2 MG/ML IJ SOLN
INTRAMUSCULAR | Status: DC | PRN
  Administered 2024-09-18 (×2): .2 mg via INTRAVENOUS

## 2024-09-18 MED ORDER — ACETAMINOPHEN 325 MG PO TABS
325.0000 mg | ORAL_TABLET | Freq: Four times a day (QID) | ORAL | Status: DC | PRN
  Filled 2024-09-18: qty 2

## 2024-09-18 MED ORDER — FLEET ENEMA RE ENEM: 1.0000 | ENEMA | Freq: Once | RECTAL | Status: DC | PRN

## 2024-09-18 MED ORDER — BUPIVACAINE LIPOSOME 1.3 % IJ SUSP
INTRAMUSCULAR | Status: AC
  Filled 2024-09-18: qty 40

## 2024-09-18 MED ORDER — ORAL CARE MOUTH RINSE: 15.0000 mL | Freq: Once | OROMUCOSAL | Status: AC

## 2024-09-18 MED ORDER — BUPIVACAINE HCL (PF) 0.5 % IJ SOLN
INTRAMUSCULAR | Status: AC
  Filled 2024-09-18: qty 10

## 2024-09-18 MED ORDER — GLYCOPYRROLATE 0.2 MG/ML IJ SOLN
INTRAMUSCULAR | Status: AC
  Filled 2024-09-18: qty 1

## 2024-09-18 MED ORDER — HYDROMORPHONE HCL 1 MG/ML IJ SOLN
0.5000 mg | INTRAMUSCULAR | Status: DC | PRN
  Administered 2024-09-18: 1 mg via INTRAVENOUS
  Filled 2024-09-18: qty 1

## 2024-09-18 MED ORDER — SENNOSIDES-DOCUSATE SODIUM 8.6-50 MG PO TABS
1.0000 | ORAL_TABLET | Freq: Two times a day (BID) | ORAL | Status: DC
  Administered 2024-09-18 – 2024-09-19 (×3): 1 via ORAL
  Filled 2024-09-18 (×3): qty 1

## 2024-09-18 MED ORDER — SODIUM CHLORIDE (PF) 0.9 % IJ SOLN
INTRAMUSCULAR | Status: DC | PRN
  Administered 2024-09-18: 120 mL

## 2024-09-18 MED ORDER — OXYCODONE HCL 5 MG/5ML PO SOLN: 5.0000 mg | Freq: Once | ORAL | Status: DC | PRN

## 2024-09-18 MED ORDER — PROPOFOL 1000 MG/100ML IV EMUL
INTRAVENOUS | Status: AC
  Filled 2024-09-18: qty 100

## 2024-09-18 MED ORDER — MUPIROCIN 2 % EX OINT: 1.0000 | TOPICAL_OINTMENT | Freq: Two times a day (BID) | CUTANEOUS | 0 refills | Status: DC

## 2024-09-18 MED ORDER — ONDANSETRON HCL 4 MG PO TABS: 4.0000 mg | ORAL_TABLET | Freq: Four times a day (QID) | ORAL | Status: DC | PRN

## 2024-09-18 MED ORDER — ENSURE PRE-SURGERY PO LIQD
296.0000 mL | Freq: Once | ORAL | Status: DC
  Filled 2024-09-18: qty 296

## 2024-09-18 MED ORDER — MIDAZOLAM HCL 2 MG/2ML IJ SOLN
INTRAMUSCULAR | Status: AC
  Filled 2024-09-18: qty 2

## 2024-09-18 MED ORDER — BUPIVACAINE HCL (PF) 0.5 % IJ SOLN
INTRAMUSCULAR | Status: DC | PRN
  Administered 2024-09-18: 3 mL via INTRATHECAL

## 2024-09-18 MED ORDER — VANCOMYCIN HCL IN DEXTROSE 1-5 GM/200ML-% IV SOLN
1000.0000 mg | Freq: Once | INTRAVENOUS | Status: AC
  Administered 2024-09-18: 1000 mg via INTRAVENOUS

## 2024-09-18 MED ORDER — PANTOPRAZOLE SODIUM 40 MG PO TBEC
40.0000 mg | DELAYED_RELEASE_TABLET | Freq: Two times a day (BID) | ORAL | Status: DC
  Administered 2024-09-18 – 2024-09-19 (×3): 40 mg via ORAL
  Filled 2024-09-18 (×3): qty 1

## 2024-09-18 MED ORDER — BISOPROLOL FUMARATE 5 MG PO TABS
5.0000 mg | ORAL_TABLET | Freq: Every day | ORAL | Status: DC
  Administered 2024-09-19: 5 mg via ORAL
  Filled 2024-09-18: qty 1

## 2024-09-18 MED ORDER — ACETAMINOPHEN 10 MG/ML IV SOLN: 1000.0000 mg | Freq: Once | INTRAVENOUS | Status: DC | PRN

## 2024-09-18 MED ORDER — LACTATED RINGERS IV SOLN: INTRAVENOUS | Status: DC

## 2024-09-18 MED ORDER — EPHEDRINE SULFATE-NACL 50-0.9 MG/10ML-% IV SOSY
PREFILLED_SYRINGE | INTRAVENOUS | Status: DC | PRN
  Administered 2024-09-18 (×2): 5 mg via INTRAVENOUS
  Administered 2024-09-18: 10 mg via INTRAVENOUS
  Administered 2024-09-18: 5 mg via INTRAVENOUS
  Administered 2024-09-18: 10 mg via INTRAVENOUS
  Administered 2024-09-18 (×2): 5 mg via INTRAVENOUS

## 2024-09-18 MED ADMIN — Acetaminophen IV Soln 10 MG/ML: 1000 mg | INTRAVENOUS | NDC 00264410090

## 2024-09-18 MED ADMIN — Dexmedetomidine HCl in NaCl 0.9% IV Soln 80 MCG/20ML: 4 ug | INTRAVENOUS | NDC 00781349395

## 2024-09-18 MED ADMIN — Acetaminophen IV Soln 10 MG/ML: 1000 mg | INTRAVENOUS | NDC 63323043441

## 2024-09-18 MED ADMIN — Lidocaine HCl(Cardiac) IV PF Soln Pref Syr 100 MG/5ML (2%): 60 mg | INTRAVENOUS | NDC 00409132305

## 2024-09-18 MED ADMIN — Metoclopramide HCl Tab 5 MG (Base Equivalent): 10 mg | ORAL | NDC 51079088601

## 2024-09-18 MED ADMIN — Hydrochlorothiazide Tab 12.5 MG: 6.25 mg | ORAL | NDC 69315015501

## 2024-09-18 MED ADMIN — Ondansetron HCl Inj 4 MG/2ML (2 MG/ML): 4 mg | INTRAVENOUS | NDC 60505613005

## 2024-09-18 MED ADMIN — Brimonidine Tartrate Ophth Soln 0.2%: 1 [drp] | OPHTHALMIC | NDC 24208041105

## 2024-09-18 MED FILL — Hydrochlorothiazide Tab 12.5 MG: 6.2500 mg | ORAL | Qty: 1 | Status: AC

## 2024-09-18 MED FILL — henylephrine-NaCl Pref Syr 0.8 MG/10ML-0.9% (80 MCG/ML): INTRAVENOUS | Qty: 10 | Status: AC

## 2024-09-18 MED FILL — Metoclopramide HCl Tab 5 MG (Base Equivalent): 10.0000 mg | ORAL | Qty: 2 | Status: AC

## 2024-09-18 MED FILL — Ondansetron HCl Inj 4 MG/2ML (2 MG/ML): INTRAMUSCULAR | Qty: 2 | Status: AC

## 2024-09-18 MED FILL — Acetaminophen IV Soln 10 MG/ML: 1000.0000 mg | INTRAVENOUS | Qty: 100 | Status: AC

## 2024-09-18 MED FILL — Vancomycin HCl-Dextrose IV Soln 1 GM/200ML-5%: INTRAVENOUS | Qty: 200 | Status: AC

## 2024-09-18 MED FILL — Brimonidine Tartrate Ophth Soln 0.2%: 1.0000 [drp] | OPHTHALMIC | Qty: 5 | Status: AC

## 2024-09-18 MED FILL — Timolol Maleate Ophth Soln 0.5%: 1.0000 [drp] | OPHTHALMIC | Qty: 5 | Status: AC

## 2024-09-18 SURGICAL SUPPLY — 63 items
ATTUNE PS FEM RT SZ 4 CEM KNEE (Femur) IMPLANT
ATTUNE PSRP INSR SZ4 6 KNEE (Insert) IMPLANT
BASEPLATE TIBIAL ROTATING SZ 4 (Knees) IMPLANT
BATTERY INSTRU NAVIGATION (MISCELLANEOUS) ×4 IMPLANT
BIT DRILL QUICK REL 1/8 2PK SL (BIT) ×1 IMPLANT
BLADE CLIPPER SURG (BLADE) IMPLANT
BLADE SAW 70X12.5 (BLADE) ×1 IMPLANT
BLADE SAW 90X13X1.19 OSCILLAT (BLADE) ×1 IMPLANT
BLADE SAW 90X25X1.19 OSCILLAT (BLADE) ×1 IMPLANT
BRUSH SCRUB EZ PLAIN DRY (MISCELLANEOUS) ×1 IMPLANT
CEMENT BONE GENTAMICIN 40 (Cement) IMPLANT
COOLER ICEMAN CLASSIC (MISCELLANEOUS) ×1 IMPLANT
CUFF TRNQT CYL 24X4X16.5-23 (TOURNIQUET CUFF) IMPLANT
DRAPE SHEET LG 3/4 BI-LAMINATE (DRAPES) ×1 IMPLANT
DRSG AQUACEL AG ADV 3.5X14 (GAUZE/BANDAGES/DRESSINGS) ×1 IMPLANT
DRSG MEPILEX SACRM 8.7X9.8 (GAUZE/BANDAGES/DRESSINGS) ×1 IMPLANT
DRSG TEGADERM 4X4.75 (GAUZE/BANDAGES/DRESSINGS) ×1 IMPLANT
DURAPREP 26ML APPLICATOR (WOUND CARE) ×2 IMPLANT
ELECT CAUTERY BLADE 6.4 (BLADE) ×1 IMPLANT
ELECTRODE REM PT RTRN 9FT ADLT (ELECTROSURGICAL) ×1 IMPLANT
EVACUATOR 1/8 PVC DRAIN (DRAIN) ×1 IMPLANT
EX-PIN ORTHOLOCK NAV 4X150 (PIN) ×2 IMPLANT
GAUZE XEROFORM 1X8 LF (GAUZE/BANDAGES/DRESSINGS) ×1 IMPLANT
GLOVE BIOGEL M STRL SZ7.5 (GLOVE) ×6 IMPLANT
GLOVE BIOGEL PI IND STRL 8 (GLOVE) ×1 IMPLANT
GLOVE SRG 8 PF TXTR STRL LF DI (GLOVE) ×1 IMPLANT
GOWN STRL REUS W/ TWL LRG LVL3 (GOWN DISPOSABLE) ×1 IMPLANT
GOWN STRL REUS W/ TWL XL LVL3 (GOWN DISPOSABLE) ×1 IMPLANT
GOWN TOGA ZIPPER T7+ PEEL AWAY (MISCELLANEOUS) ×1 IMPLANT
HOLDER FOLEY CATH W/STRAP (MISCELLANEOUS) ×1 IMPLANT
HOOD PEEL AWAY T7 (MISCELLANEOUS) ×1 IMPLANT
KIT TURNOVER KIT A (KITS) ×1 IMPLANT
KNIFE SCULPS 14X20 (INSTRUMENTS) ×1 IMPLANT
MANIFOLD NEPTUNE II (INSTRUMENTS) ×2 IMPLANT
NDL SPNL 20GX3.5 QUINCKE YW (NEEDLE) ×2 IMPLANT
NEEDLE SPNL 20GX3.5 QUINCKE YW (NEEDLE) ×2 IMPLANT
PACK TOTAL KNEE (MISCELLANEOUS) ×1 IMPLANT
PAD ABD DERMACEA PRESS 5X9 (GAUZE/BANDAGES/DRESSINGS) ×2 IMPLANT
PAD ARMBOARD POSITIONER FOAM (MISCELLANEOUS) ×3 IMPLANT
PAD COLD UNI WRAP-ON (PAD) ×1 IMPLANT
PATELLA MEDIAL ATTUN 35MM KNEE (Knees) IMPLANT
PENCIL SMOKE EVACUATOR COATED (MISCELLANEOUS) ×1 IMPLANT
PIN DRILL FIX HALF THREAD (BIT) ×2 IMPLANT
PIN FIXATION 1/8DIA X 3INL (PIN) ×1 IMPLANT
SOL .9 NS 3000ML IRR UROMATIC (IV SOLUTION) ×1 IMPLANT
SOLN STERILE WATER 1000 ML (IV SOLUTION) ×1 IMPLANT
SOLN STERILE WATER BTL 1000 ML (IV SOLUTION) ×1 IMPLANT
SOLUTION IRRIG SURGIPHOR (IV SOLUTION) ×1 IMPLANT
SPONGE DRAIN TRACH 4X4 STRL 2S (GAUZE/BANDAGES/DRESSINGS) ×1 IMPLANT
STAPLER SKIN PROX 35W (STAPLE) ×1 IMPLANT
STOCKINETTE IMPERV 14X48 (MISCELLANEOUS) ×1 IMPLANT
STOCKINETTE STRL BIAS CUT 8X4 (MISCELLANEOUS) ×1 IMPLANT
STRAP TIBIA SHORT (MISCELLANEOUS) ×1 IMPLANT
SUCTION TUBE FRAZIER 10FR DISP (SUCTIONS) ×1 IMPLANT
SUT VIC AB 0 CT1 36 (SUTURE) ×1 IMPLANT
SUT VIC AB 1 CT1 36 (SUTURE) ×2 IMPLANT
SUT VIC AB 2-0 CT2 27 (SUTURE) ×1 IMPLANT
SYR 30ML LL (SYRINGE) ×2 IMPLANT
TIP FAN IRRIG PULSAVAC PLUS (DISPOSABLE) ×1 IMPLANT
TOWEL OR 17X26 4PK STRL BLUE (TOWEL DISPOSABLE) IMPLANT
TOWER CARTRIDGE SMART MIX (DISPOSABLE) ×1 IMPLANT
TRAP FLUID SMOKE EVACUATOR (MISCELLANEOUS) ×1 IMPLANT
TRAY FOLEY MTR SLVR 16FR STAT (SET/KITS/TRAYS/PACK) ×1 IMPLANT

## 2024-09-18 NOTE — Progress Notes (Signed)
 Patient is not able to walk the distance required to go the bathroom, or he/she is unable to safely negotiate stairs required to access the bathroom.  A 3in1 BSC will alleviate this problem   Amenda Duclos P. Angie Fava M.D.

## 2024-09-18 NOTE — Interval H&P Note (Signed)
 History and Physical Interval Note:  09/18/2024 6:16 AM  Lindsay [name]  has presented today for surgery, with the diagnosis of Primary osteoarthritis of right knee.  The various methods of treatment have been discussed with the patient and family. After consideration of risks, benefits and other options for treatment, the patient has consented to  Procedure(s): ARTHROPLASTY, KNEE, TOTAL, USING IMAGELESS COMPUTER-ASSISTED NAVIGATION (Right) as a surgical intervention.  The patient's history has been reviewed, patient examined, no change in status, stable for surgery.  I have reviewed the patient's chart and labs.  Questions were answered to the patient's satisfaction.     Devontae Casasola P Sunni Richardson

## 2024-09-18 NOTE — Progress Notes (Incomplete)
 Subjective: 1 Day Post-Op Procedure(s) (LRB): ARTHROPLASTY, KNEE, TOTAL, USING IMAGELESS COMPUTER-ASSISTED NAVIGATION (Right) Patient reports pain as mild.   Patient seen in rounds with Dr. Mardee. Patient is well, and has had no acute complaints or problems Denies any CP, SOB, N/V, fevers or chills We will start therapy today.  Plan is to go Home after hospital stay.  Objective: Vital signs in last 24 hours: Temp:  [97.5 F (36.4 C)-99.1 F (37.3 C)] 98.2 F (36.8 C) (10/18 0744) Pulse Rate:  [62-68] 67 (10/18 0744) Resp:  [11-22] 17 (10/18 0744) BP: (97-116)/(54-66) 115/57 (10/18 0744) SpO2:  [91 %-100 %] 97 % (10/18 0744)  Intake/Output from previous day:  Intake/Output Summary (Last 24 hours) at 09/19/2024 0815 Last data filed at 09/19/2024 0430 Gross per 24 hour  Intake 800 ml  Output 1340 ml  Net -540 ml    Intake/Output this shift: No intake/output data recorded.  Labs: Recent Labs    09/18/24 0650  HGB 13.6   Recent Labs    09/18/24 0650  HCT 40.0   Recent Labs    09/18/24 0650  NA 144  K 3.1*  CL 104  BUN 8  CREATININE 0.70  GLUCOSE 96   No results for input(s): LABPT, INR in the last 72 hours.  EXAM General - Patient is Alert, Appropriate, and Oriented Extremity - Neurologically intact Neurovascular intact Sensation intact distally Intact pulses distally Dorsiflexion/Plantar flexion intact No cellulitis present Compartment soft Dressing - dressing C/D/I and no drainage Motor Function - intact, moving foot and toes well on exam. JP Drain pulled without difficulty. Intact  Past Medical History:  Diagnosis Date   Anemia    Anxiety    Arthritis of back    Deep vein thrombosis (DVT) of left lower extremity (HCC) 12/2019   Depression    Diverticulosis    GERD (gastroesophageal reflux disease)    Hematochezia    Hypertension    Hypothyroidism    PAD (peripheral artery disease)    Wears dentures    Full upper     Assessment/Plan: 1 Day Post-Op Procedure(s) (LRB): ARTHROPLASTY, KNEE, TOTAL, USING IMAGELESS COMPUTER-ASSISTED NAVIGATION (Right) Principal Problem:   History of total knee arthroplasty, right  Estimated body mass index is 23.73 kg/m as calculated from the following:   Height as of this encounter: 5' 4 (1.626 m).   Weight as of this encounter: 62.7 kg. Advance diet Up with therapy  Patient will continue to work with physical therapy to pass postoperative PT protocols, ROM and strengthening  Discussed with the patient continuing to utilize Polar Care  Patient will use bone foam in 20-30 minute intervals  Patient will wear TED hose bilaterally to help prevent DVT and clot formation  Discussed the Aquacel bandage.  This bandage will stay in place 7 days postoperatively.  Can be replaced with honeycomb bandages that will be sent home with the patient  Discussed sending the patient home with tramadol and oxycodone  for as needed pain management.  Patient will also be sent home with Celebrex to help with swelling and inflammation.  Patient will take at home Eliquis  daily for DVT prophylaxis  JP drain removed without difficulty, intact  Weight-Bearing as tolerated to right leg  Patient will follow-up with [facility] orthopedics in 2 weeks for staple removal and reevaluation  Fonda Koyanagi, PA-C [facility] 09/19/2024, 8:15 AM

## 2024-09-18 NOTE — Discharge Summary (Signed)
 Physician Discharge Summary  Subjective: 1 Day Post-Op Procedure(s) (LRB): ARTHROPLASTY, KNEE, TOTAL, USING IMAGELESS COMPUTER-ASSISTED NAVIGATION (Right) Patient reports pain as mild.   Patient seen in rounds with Dr. Mardee. Patient is well, and has had no acute complaints or problems Denies any CP, SOB, N/V, fevers or chills We will start therapy today.  She is ready for discharge home   Physician Discharge Summary  Patient ID: Lindsay [name] MRN: [id number] DOB/AGE: [birth year] 83 y.o.  Admit date: 09/18/2024 Discharge date: 09/19/2024  Admission Diagnoses:  Discharge Diagnoses:  Principal Problem:   History of total knee arthroplasty, right   Discharged Condition: good  Hospital Course: Patient presented to the hospital on 09/18/2024 for an elective right total knee arthroplasty performed by Dr. Mardee. Patient was given 1g of TXA and 2g of Ancef  prior to the procedure. She  tolerated the procedure well without any complications. See procedural note below for details. Postoperatively, the patient did very well. She  was able to pass PT protocols on post-op day one without any issues. JP drain was removed without any difficulty and was intact. She  was able to void her bladder without any difficulty. Physical exam was unremarkable. she denies any SOB, CP, N/V, fevers or chills. Vital signs are stable. Patient is stable to discharge home.   PROCEDURE:  Right total knee arthroplasty using computer-assisted navigation   SURGEON:  Lynwood SHAUNNA Mardee Mickey. M.D.   ANESTHESIA: spinal   ESTIMATED BLOOD LOSS: 50 mL   FLUIDS REPLACED: 800 mL of crystalloid   TOURNIQUET TIME: 104 minutes   DRAINS: 2 medium Hemovac drains   SOFT TISSUE RELEASES: Anterior cruciate ligament, posterior cruciate ligament, deep medial collateral ligament, patellofemoral ligament, and posterolateral corner   IMPLANTS UTILIZED: DePuy Attune size 4 posterior stabilized femoral component (cemented), size 4  rotating platform tibial component (cemented), 35 mm medialized dome patella (cemented), and a 6 mm stabilized rotating platform polyethylene insert.  Treatments: none  Discharge Exam: Blood pressure (!) 115/57, pulse 67, temperature 98.2 F (36.8 C), temperature source Oral, resp. rate 17, height 5' 4 (1.626 m), weight 62.7 kg, SpO2 97%.   Disposition: Discharge disposition: 01-Home or Self Care     home   Allergies as of 09/19/2024       Reactions   Aspirin  Other (See Comments)   GI BLEED   Nsaids Other (See Comments)   GI BLEED   Penicillins Itching, Rash, Other (See Comments)   IgE = 134 (WNL) on 09/08/2024 Has patient had a PCN reaction causing immediate rash, facial/tongue/throat swelling, SOB or lightheadedness with hypotension: Yes Has patient had a PCN reaction causing severe rash involving mucus membranes or skin necrosis: No Has patient had a PCN reaction that required hospitalization No Has patient had a PCN reaction occurring within the last 10 years: No If all of the above answers are NO, then may proceed with Cephalosporin use.   Pollen Extract Other (See Comments)   Congestion, Eye Irritation        Medication List     TAKE these medications    acetaminophen  500 MG tablet Commonly known as: TYLENOL  Take 1,000 mg by mouth daily.   ALKA-SELTZER PLUS ALLERGY PO Take 1 tablet by mouth daily as needed (allergies).   amLODipine  10 MG tablet Commonly known as: NORVASC  Take 10 mg by mouth daily.   atorvastatin  10 MG tablet Commonly known as: LIPITOR Take 10 mg by mouth daily.   bisoprolol -hydrochlorothiazide  10-6.25 MG tablet Commonly known as: ZIAC   TAKE 1 TABLET BY MOUTH DAILY   chlorhexidine  4 % external liquid Commonly known as: HIBICLENS  Apply 15 mLs (1 Application total) topically as directed for 30 doses. Use as directed daily for 5 days every other week for 6 weeks.   Combigan  0.2-0.5 % ophthalmic solution Generic drug:  brimonidine -timolol  Place 1 drop into both eyes every 12 (twelve) hours.   Eliquis  5 MG Tabs tablet Generic drug: apixaban  TAKE 1 TABLET BY MOUTH TWICE A DAY   levothyroxine  25 MCG tablet Commonly known as: SYNTHROID  Take 25 mcg by mouth daily before breakfast.   losartan  100 MG tablet Commonly known as: COZAAR  TAKE ONE (1) TABLET BY MOUTH EVERY DAY   MULTI COMPLETE PO Take 1 tablet by mouth daily.   mupirocin ointment 2 % Commonly known as: BACTROBAN Place 1 Application into the nose 2 (two) times daily for 60 doses. Use as directed 2 times daily for 5 days every other week for 6 weeks.   nicotine  21 mg/24hr patch Commonly known as: NICODERM CQ  - dosed in mg/24 hours Place 1 patch (21 mg total) onto the skin daily.   oxyCODONE  5 MG immediate release tablet Commonly known as: Oxy IR/ROXICODONE  Take 1 tablet (5 mg total) by mouth every 4 (four) hours as needed for moderate pain (pain score 4-6) (pain score 4-6).   pantoprazole  40 MG tablet Commonly known as: PROTONIX  TAKE ONE (1) TABLET EACH DAY   pantoprazole  20 MG tablet Commonly known as: PROTONIX  Take 20 mg by mouth daily.   traMADol 50 MG tablet Commonly known as: ULTRAM Take 1-2 tablets (50-100 mg total) by mouth every 4 (four) hours as needed for moderate pain (pain score 4-6).               Durable Medical Equipment  (From admission, onward)           Start     Ordered   09/18/24 1244  DME Walker rolling  Once       Question:  Patient needs a walker to treat with the following condition  Answer:  Total knee replacement status   09/18/24 1243   09/18/24 1244  DME Bedside commode  Once       Comments: Patient is not able to walk the distance required to go the bathroom, or he/she is unable to safely negotiate stairs required to access the bathroom.  A 3in1 BSC will alleviate this problem  Question:  Patient needs a bedside commode to treat with the following condition  Answer:  Total knee  replacement status   09/18/24 1243            Follow-up Information     Drake Chew, PA-C Follow up on 10/02/2024.   Specialty: Orthopedic Surgery Why: at 1:15pm Contact information: [address] [place] [address] [phone]         Mardee Lynwood SQUIBB, MD Follow up on 11/03/2024.   Specialty: Orthopedic Surgery Why: at 2:30pm Contact information: [address] [facility] [place] [address] [phone]                 Signed: Sidra Drake 09/19/2024, 8:15 AM   Objective: Vital signs in last 24 hours: Temp:  [97.5 F (36.4 C)-99.1 F (37.3 C)] 98.2 F (36.8 C) (10/18 0744) Pulse Rate:  [62-68] 67 (10/18 0744) Resp:  [11-22] 17 (10/18 0744) BP: (97-116)/(54-66) 115/57 (10/18 0744) SpO2:  [91 %-100 %] 97 % (10/18 0744)  Intake/Output from previous day:  Intake/Output Summary (Last 24 hours) at 09/19/2024 0815 Last data filed at 09/19/2024 0430 Gross per 24 hour  Intake 800 ml  Output 1340 ml  Net -540 ml    Intake/Output this shift: No intake/output data recorded.  Labs: Recent Labs    09/18/24 0650  HGB 13.6   Recent Labs    09/18/24 0650  HCT 40.0   Recent Labs    09/18/24 0650  NA 144  K 3.1*  CL 104  BUN 8  CREATININE 0.70  GLUCOSE 96   No results for input(s): LABPT, INR in the last 72 hours.  EXAM: General - Patient is Alert, Appropriate, and Oriented Extremity - Neurologically intact Neurovascular intact Sensation intact distally Intact pulses distally Dorsiflexion/Plantar flexion intact No cellulitis present Compartment soft Dressing - dressing C/D/I and no drainage Motor Function - intact, moving foot and toes well on exam. JP Drain pulled without difficulty. Intact  Assessment/Plan: 1 Day Post-Op Procedure(s) (LRB): ARTHROPLASTY, KNEE, TOTAL, USING IMAGELESS COMPUTER-ASSISTED NAVIGATION (Right) Procedure(s) (LRB): ARTHROPLASTY, KNEE, TOTAL, USING IMAGELESS  COMPUTER-ASSISTED NAVIGATION (Right) Past Medical History:  Diagnosis Date   Anemia    Anxiety    Arthritis of back    Deep vein thrombosis (DVT) of left lower extremity (HCC) 12/2019   Depression    Diverticulosis    GERD (gastroesophageal reflux disease)    Hematochezia    Hypertension    Hypothyroidism    PAD (peripheral artery disease)    Wears dentures    Full upper   Principal Problem:   History of total knee arthroplasty, right  Estimated body mass index is 23.73 kg/m as calculated from the following:   Height as of this encounter: 5' 4 (1.626 m).   Weight as of this encounter: 62.7 kg.   Patient will continue to work with physical therapy to pass postoperative PT protocols, ROM and strengthening   Discussed with the patient continuing to utilize Polar Care   Patient will use bone foam in 20-30 minute intervals   Patient will wear TED hose bilaterally to help prevent DVT and clot formation   Discussed the Aquacel bandage.  This bandage will stay in place 7 days postoperatively.  Can be replaced with honeycomb bandages that will be sent home with the patient   Discussed sending the patient home with tramadol and oxycodone  for as needed pain management.  Patient will also be sent home with Celebrex to help with swelling and inflammation.  Patient will take an 81 mg aspirin  twice daily for DVT prophylaxis   JP drain removed without difficulty, intact   Weight-Bearing as tolerated to right leg   Patient will follow-up with [facility] orthopedics in 2 weeks for staple removal and reevaluation  Diet - Regular diet Follow up - in 2 weeks Activity - WBAT Disposition - Home Condition Upon Discharge - Good DVT Prophylaxis - TED hose and Eliquis   Fonda CHARLENA Koyanagi, PA-C Orthopaedic Surgery 09/19/2024, 8:15 AM

## 2024-09-18 NOTE — Anesthesia Procedure Notes (Signed)
 Spinal  Patient location during procedure: OR Start time: 09/18/2024 7:23 AM End time: 09/18/2024 7:28 AM Reason for block: surgical anesthesia Staffing Performed: resident/CRNA  Resident/CRNA: Trudy Rankin LABOR, CRNA Performed by: Trudy Rankin LABOR, CRNA Authorized by: Myra Lynwood MATSU, MD   Preanesthetic Checklist Completed: patient identified, IV checked, site marked, risks and benefits discussed, surgical consent, monitors and equipment checked, pre-op evaluation and timeout performed Spinal Block Patient position: sitting Prep: ChloraPrep Patient monitoring: heart rate, continuous pulse ox and blood pressure Approach: midline Location: L3-4 Injection technique: single-shot Needle Needle type: Introducer and Pencan  Needle gauge: 25 G Needle length: 10 cm Assessment Sensory level: T4 Events: CSF return Additional Notes Negative paresthesia. Negative blood return. Positive free-flowing CSF. Expiration date of kit checked and confirmed. Patient tolerated procedure well, without complications.

## 2024-09-18 NOTE — Op Note (Signed)
 OPERATIVE NOTE  DATE OF SURGERY:  09/18/2024  PATIENT NAME:  Lindsay [name]   DOB: [birth year]  MRN: [id number]  PRE-OPERATIVE DIAGNOSIS: Degenerative arthrosis of the right knee, primary  POST-OPERATIVE DIAGNOSIS:  Same  PROCEDURE:  Right total knee arthroplasty using computer-assisted navigation  SURGEON:  Lynwood SHAUNNA Mardee Mickey. M.D.  ANESTHESIA: spinal  ESTIMATED BLOOD LOSS: 50 mL  FLUIDS REPLACED: 800 mL of crystalloid  TOURNIQUET TIME: 104 minutes  DRAINS: 2 medium Hemovac drains  SOFT TISSUE RELEASES: Anterior cruciate ligament, posterior cruciate ligament, deep medial collateral ligament, patellofemoral ligament, and posterolateral corner  IMPLANTS UTILIZED: DePuy Attune size 4 posterior stabilized femoral component (cemented), size 4 rotating platform tibial component (cemented), 35 mm medialized dome patella (cemented), and a 6 mm stabilized rotating platform polyethylene insert.  INDICATIONS FOR SURGERY: Lindsay [name] is a 83 y.o. year old female with a long history of progressive knee pain. X-rays demonstrated severe degenerative changes in tricompartmental fashion. The patient had not seen any significant improvement despite conservative nonsurgical intervention. After discussion of the risks and benefits of surgical intervention, the patient expressed understanding of the risks benefits and agree with plans for total knee arthroplasty.   The risks, benefits, and alternatives were discussed at length including but not limited to the risks of infection, bleeding, nerve injury, stiffness, blood clots, the need for revision surgery, cardiopulmonary complications, among others, and they were willing to proceed.  PROCEDURE IN DETAIL: The patient was brought into the operating room and, after adequate spinal anesthesia was achieved, a tourniquet was placed on the patient's upper thigh. The patient's knee and leg were cleaned and prepped with alcohol and DuraPrep and draped in the  usual sterile fashion. A timeout was performed as per usual protocol. The lower extremity was exsanguinated using an Esmarch, and the tourniquet was inflated to 300 mmHg. An anterior longitudinal incision was made followed by a standard mid vastus approach. The deep fibers of the medial collateral ligament were elevated in a subperiosteal fashion off of the medial flare of the tibia so as to maintain a continuous soft tissue sleeve. The patella was subluxed laterally and the patellofemoral ligament was incised. Inspection of the knee demonstrated severe degenerative changes with full-thickness loss of articular cartilage. Osteophytes were debrided using a rongeur. Anterior and posterior cruciate ligaments were excised. Two 4.0 mm Schanz pins were inserted in the femur and into the tibia for attachment of the array of trackers used for computer-assisted navigation. Hip center was identified using a circumduction technique. Distal landmarks were mapped using the computer. The distal femur and proximal tibia were mapped using the computer. The distal femoral cutting guide was positioned using computer-assisted navigation so as to achieve a 5 distal valgus cut. The femur was sized and it was felt that a size 4 femoral component was appropriate. A size 4 femoral cutting guide was positioned and the anterior cut was performed and verified using the computer. This was followed by completion of the posterior and chamfer cuts. Femoral cutting guide for the central box was then positioned in the center box cut was performed.  Attention was then directed to the proximal tibia. Medial and lateral menisci were excised. The extramedullary tibial cutting guide was positioned using computer-assisted navigation so as to achieve a 0 varus-valgus alignment and 3 posterior slope. The cut was performed and verified using the computer. The proximal tibia was sized and it was felt that a size 4 tibial tray was appropriate. Tibial  and femoral trials  were inserted followed by insertion of a 5 mm polyethylene insert. The knee was felt to be tight laterally.  The trial components were removed and the knee was brought into full extension and distracted using the Moreland retractors.  The posterolateral corner was carefully released using a combination of electrocautery and Metzenbaum scissors.  Trial components were reinserted followed by placement of a 6 mm polyethylene trial.  This allowed for excellent mediolateral soft tissue balancing both in flexion and in full extension. Finally, the patella was cut and prepared so as to accommodate a 35 mm medialized dome patella. A patella trial was placed and the knee was placed through a range of motion with excellent patellar tracking appreciated. The femoral trial was removed after debridement of posterior osteophytes. The central post-hole for the tibial component was reamed followed by insertion of a keel punch. Tibial trials were then removed. Cut surfaces of bone were irrigated with copious amounts of normal saline using pulsatile lavage and then suctioned dry. Polymethylmethacrylate cement with gentamicin was prepared in the usual fashion using a vacuum mixer. Cement was applied to the cut surface of the proximal tibia as well as along the undersurface of a size 4 rotating platform tibial component. Tibial component was positioned and impacted into place. Excess cement was removed using Personal assistant. Cement was then applied to the cut surfaces of the femur as well as along the posterior flanges of the size 4 femoral component. The femoral component was positioned and impacted into place. Excess cement was removed using Personal assistant. A 6 mm polyethylene trial was inserted and the knee was brought into full extension with steady axial compression applied. Finally, cement was applied to the backside of a 35 mm medialized dome patella and the patellar component was positioned and patellar  clamp applied. Excess cement was removed using Personal assistant. After adequate curing of the cement, the tourniquet was deflated after a total tourniquet time of 104 minutes. Hemostasis was achieved using electrocautery. The knee was irrigated with copious amounts of normal saline using pulsatile lavage followed by 450 ml of Surgiphor and then suctioned dry. 20 mL of 1.3% Exparel  and 60 mL of 0.25% Marcaine  in 40 mL of normal saline was injected along the posterior capsule, medial and lateral gutters, and along the arthrotomy site. A 6 mm stabilized rotating platform polyethylene insert was inserted and the knee was placed through a range of motion with excellent mediolateral soft tissue balancing appreciated and excellent patellar tracking noted. 2 medium drains were placed in the wound bed and brought out through separate stab incisions. The medial parapatellar portion of the incision was reapproximated using interrupted sutures of #1 Vicryl. Subcutaneous tissue was approximated in layers using first #0 Vicryl followed #2-0 Vicryl. The skin was approximated with skin staples. A sterile dressing was applied.  The patient tolerated the procedure well and was transported to the recovery room in stable condition.    Elven Laboy P. Nikkie Liming, Jr., M.D.

## 2024-09-18 NOTE — Transfer of Care (Signed)
 Immediate Anesthesia Transfer of Care Note  Patient: Lindsay [name]  Procedure(s) Performed: ARTHROPLASTY, KNEE, TOTAL, USING IMAGELESS COMPUTER-ASSISTED NAVIGATION (Right: Knee)  Patient Location: PACU  Anesthesia Type:General and Spinal  Level of Consciousness: awake, alert , and oriented  Airway & Oxygen  Therapy: Patient Spontanous Breathing and Patient connected to face mask oxygen   Post-op Assessment: Report given to RN and Post -op Vital signs reviewed and stable  Post vital signs: Reviewed and stable  Last Vitals:  Vitals Value Taken Time  BP 97/62 09/18/24 11:01  Temp 36.6 C 09/18/24 11:00  Pulse 65 09/18/24 11:05  Resp 16 09/18/24 11:05  SpO2 100 % 09/18/24 11:05  Vitals shown include unfiled device data.  Last Pain:  Vitals:   09/18/24 0624  TempSrc: Temporal  PainSc: 0-No pain         Complications: No notable events documented.

## 2024-09-18 NOTE — Anesthesia Preprocedure Evaluation (Addendum)
 Anesthesia Evaluation  Patient identified by MRN, date of birth, ID band Patient awake    Reviewed: Allergy & Precautions, H&P , NPO status , Patient's Chart, lab work & pertinent test results, reviewed documented beta blocker date and time   Airway Mallampati: II   Neck ROM: full    Dental  (+) Poor Dentition   Pulmonary Current Smoker and Patient abstained from smoking.   Pulmonary exam normal        Cardiovascular Exercise Tolerance: Poor hypertension, On Medications + angina with exertion + Peripheral Vascular Disease  (-) CAD and (-) Past MI Normal cardiovascular exam(-) dysrhythmias (-) Valvular Problems/Murmurs Rhythm:regular Rate:Normal     Neuro/Psych   Anxiety Depression    negative neurological ROS  negative psych ROS   GI/Hepatic Neg liver ROS,GERD  Medicated,,  Endo/Other  Hypothyroidism    Renal/GU negative Renal ROS  negative genitourinary   Musculoskeletal   Abdominal   Peds  Hematology  (+) Blood dyscrasia, anemia   Anesthesia Other Findings Past Medical History: No date: Anemia No date: Anxiety No date: Arthritis of back 12/2019: Deep vein thrombosis (DVT) of left lower extremity (HCC) No date: Depression No date: Diverticulosis No date: GERD (gastroesophageal reflux disease) No date: Hematochezia No date: Hypertension No date: Hypothyroidism No date: PAD (peripheral artery disease) No date: Wears dentures     Comment:  Full upper Past Surgical History: No date: ABDOMINAL HYSTERECTOMY 1995: CARDIAC CATHETERIZATION     Comment:  normal, Dr Florencio 10/03/2022: CATARACT EXTRACTION W/PHACO; Right     Comment:  Procedure: CATARACT EXTRACTION PHACO AND INTRAOCULAR               LENS PLACEMENT (IOC) RIGHT 9.96 01:10.5;  Surgeon:               Mittie Gaskin, MD;  Location: [facility];              Service: Ophthalmology;  Laterality: Right; 10/17/2022: CATARACT EXTRACTION  W/PHACO; Left     Comment:  Procedure: CATARACT EXTRACTION PHACO AND INTRAOCULAR               LENS PLACEMENT (IOC) LEFT malyugin  7.56  01:03.5;                Surgeon: Mittie Gaskin, MD;  Location: [facility]               SURGERY CN[facility];  Service: Ophthalmology;  Laterality: Left; 12/29/08: COLONOSCOPY 04/18/2015: COLONOSCOPY; N/A     Comment:  Procedure: COLONOSCOPY;  Surgeon: Rogelia Copping, MD;                Location: [facility];  Service:               Gastroenterology;  Laterality: N/A;  cecum time- 0957 No date: FOOT SURGERY; Right 10/05/2015: KNEE ARTHROSCOPY; Right     Comment:  Procedure: ARTHROSCOPY KNEE, partial lateral               menisectomy;  Surgeon: Kayla Pinal, MD;  Location: [facility]              ORS;  Service: Orthopedics;  Laterality: Right; 02/27/2018: LOWER EXTREMITY ANGIOGRAPHY; Left     Comment:  Procedure: LOWER EXTREMITY ANGIOGRAPHY;  Surgeon: Marea Selinda RAMAN, MD;  Location: [facility] INVASIVE CV LAB;  Service:  Cardiovascular;  Laterality: Left; 10/15/2018: LOWER EXTREMITY ANGIOGRAPHY; Left     Comment:  Procedure: LOWER EXTREMITY ANGIOGRAPHY;  Surgeon: Marea Selinda RAMAN, MD;  Location: [facility] INVASIVE CV LAB;  Service:               Cardiovascular;  Laterality: Left; 12/29/2018: LOWER EXTREMITY ANGIOGRAPHY; Left     Comment:  Procedure: LOWER EXTREMITY ANGIOGRAPHY;  Surgeon: Marea Selinda RAMAN, MD;  Location: [facility] INVAS[facility] CV LAB;  Service:               Cardiovascular;  Laterality: Left; 12/23/2019: LOWER EXTREMITY ANGIOGRAPHY; Left     Comment:  Procedure: LOWER EXTREMITY ANGIOGRAPHY;  Surgeon: Marea Selinda RAMAN, MD;  Location: [facility] INVASIVE CV LAB;  Service:               Cardiovascular;  Laterality: Left; 12/24/2019: LOWER EXTREMITY ANGIOGRAPHY; Left     Comment:  Procedure: Lower Extremity Angiography;  Surgeon: Marea Selinda RAMAN, MD;  Location: [facility] INVASIVE CV LAB;  Service:                Cardiovascular;  Laterality: Left; 04/18/2015: POLYPECTOMY     Comment:  Procedure: POLYPECTOMY INTESTINAL;  Surgeon: Rogelia Copping, MD;  Location: [facility];  Service:               Gastroenterology;; No date: WRIST SURGERY; Right BMI    Body Mass Index: 23.73 kg/m     Reproductive/Obstetrics negative OB ROS                              Anesthesia Physical Anesthesia Plan  ASA: 3  Anesthesia Plan: Spinal   Post-op Pain Management:    Induction:   PONV Risk Score and Plan: 3  Airway Management Planned:   Additional Equipment:   Intra-op Plan:   Post-operative Plan:   Informed Consent: I have reviewed the patients History and Physical, chart, labs and discussed the procedure including the risks, benefits and alternatives for the proposed anesthesia with the patient or authorized representative who has indicated his/her understanding and acceptance.     Dental Advisory Given  Plan Discussed with: CRNA  Anesthesia Plan Comments:          Anesthesia Quick Evaluation

## 2024-09-18 NOTE — Evaluation (Signed)
 Physical Therapy Evaluation Patient Details Name: Lindsay [name] MRN: [id number] DOB: June 14, 1941 Today's Date: 09/18/2024  History of Present Illness  Pt is an 83 y/o F s/p R TKA on 10/17. PMH significant for chicken pox, depression, GERD, HLD, HTN, hx of DVT on Eliquis .  Clinical Impression  Pt A&Ox4, pleasant and agreeable to participate in PT evaluation. Pt cited R knee pain of 8/10 intensity during session- RN informed. At baseline, pt is IND with mobility/ADLs, denies hx of falls or previous AD use. Pt was met semi-supine in bed, able to perform SLR and quad sets with fair tolerance. Pt transferred supine > sit with supervision, HOB elevated and inc time/effort. Pt performed STS from elevated EOB with minA to achieve standing, mod VC/TC for proper hand placement on RW for transfer. Pt amb ~33ft forward from EOB with CGA, mod VC for step-to pattern sequencing and RW positioning to maintain BOS within RW frame. No LOB or R knee buckling with WB. Pt was left seated in recliner at end of session, all needs in reach. Pt would benefit from skilled PT intervention to address listed deficits (see PT Problem List) and allow for safe return to PLOF.         If plan is discharge home, recommend the following: A little help with walking and/or transfers;A little help with bathing/dressing/bathroom;Assistance with cooking/housework;Assist for transportation;Help with stairs or ramp for entrance   Can travel by private vehicle        Equipment Recommendations Rolling walker (2 wheels);BSC/3in1  Recommendations for Other Services       Functional Status Assessment Patient has had a recent decline in their functional status and demonstrates the ability to make significant improvements in function in a reasonable and predictable amount of time.     Precautions / Restrictions Precautions Precautions: Knee;Fall Precaution Booklet Issued: Yes (comment) Recall of Precautions/Restrictions:  Intact Restrictions Weight Bearing Restrictions Per Provider Order: Yes RLE Weight Bearing Per Provider Order: Weight bearing as tolerated      Mobility  Bed Mobility Overal bed mobility: Needs Assistance Bed Mobility: Supine to Sit     Supine to sit: Supervision, HOB elevated     General bed mobility comments: no physical assistance required, increased time/effort to achieve sitting EOB    Transfers Overall transfer level: Needs assistance Equipment used: Rolling walker (2 wheels) Transfers: Sit to/from Stand Sit to Stand: Min assist, From elevated surface           General transfer comment: minA STS from elevated EOB, mod VC for proper hand placement on RW. Good eccentric control for stand > sit    Ambulation/Gait Ambulation/Gait assistance: Contact guard assist Gait Distance (Feet): 4 Feet Assistive device: Rolling walker (2 wheels) Gait Pattern/deviations: Step-to pattern, Decreased stance time - right, Decreased weight shift to right, Narrow base of support Gait velocity: decreased     General Gait Details: no LOB or R knee buckling, very slow cadence. Mod VC for step-to sequencing and positioning of RW. Heavy BUE support on RW throughout  Stairs            Wheelchair Mobility     Tilt Bed    Modified Rankin (Stroke Patients Only)       Balance Overall balance assessment: Needs assistance Sitting-balance support: Feet supported Sitting balance-Leahy Scale: Good Sitting balance - Comments: steady static and dynamic sitting   Standing balance support: Bilateral upper extremity supported, Reliant on assistive device for balance Standing balance-Leahy Scale: Fair Standing balance comment:  heavy BUE support on RW in standing                             Pertinent Vitals/Pain Pain Assessment Pain Assessment: 0-10 Pain Score: 8  Pain Location: R knee Pain Descriptors / Indicators: Aching, Operative site guarding, Sore Pain  Intervention(s): Limited activity within patient's tolerance, Monitored during session, Repositioned, Patient requesting pain meds-RN notified, Ice applied    Home Living Family/patient expects to be discharged to:: Private residence Living Arrangements: Alone (pt's granddaughter will be staying with her at d/c for a few weeks) Available Help at Discharge: Family;Available 24 hours/day Type of Home: House Home Access: Stairs to enter Entrance Stairs-Rails: Right;Left;Can reach both Entrance Stairs-Number of Steps: 6   Home Layout: One level Home Equipment: None Additional Comments: Pt's grand-daghter reports she will be living with patient temporarily to assist her at d/c    Prior Function Prior Level of Function : Independent/Modified Independent             Mobility Comments: Pt reports being IND with mobility, no previous AD use, denies hx of falls, states that she is mostly a household ambulator ADLs Comments: IND with ADLs     Extremity/Trunk Assessment   Upper Extremity Assessment Upper Extremity Assessment: Generalized weakness    Lower Extremity Assessment Lower Extremity Assessment: Generalized weakness (able to perform SLR and LAQ with RLE, sensation WFL)       Communication   Communication Communication: No apparent difficulties    Cognition Arousal: Alert Behavior During Therapy: WFL for tasks assessed/performed   PT - Cognitive impairments: No apparent impairments                       PT - Cognition Comments: A&Ox4 Following commands: Intact       Cueing Cueing Techniques: Verbal cues, Tactile cues, Visual cues     General Comments      Exercises Total Joint Exercises Quad Sets: AROM, Right, 10 reps, Supine Long Arc Quad: AROM, 15 reps, Seated, Right Knee Flexion: AROM, Right, 10 reps, Seated Goniometric ROM: R knee AROM 8-65 deg   Assessment/Plan    PT Assessment Patient needs continued PT services  PT Problem List Decreased  strength;Decreased range of motion;Decreased activity tolerance;Decreased balance;Decreased mobility;Decreased knowledge of use of DME;Pain       PT Treatment Interventions DME instruction;Gait training;Stair training;Functional mobility training;Therapeutic activities;Therapeutic exercise;Balance training;Neuromuscular re-education;Patient/family education    PT Goals (Current goals can be found in the Care Plan section)  Acute Rehab PT Goals Patient Stated Goal: to go home PT Goal Formulation: With patient Time For Goal Achievement: 10/02/24 Potential to Achieve Goals: Fair    Frequency BID     Co-evaluation               AM-PAC PT 6 Clicks Mobility  Outcome Measure Help needed turning from your back to your side while in a flat bed without using bedrails?: A Little Help needed moving from lying on your back to sitting on the side of a flat bed without using bedrails?: A Little Help needed moving to and from a bed to a chair (including a wheelchair)?: A Little Help needed standing up from a chair using your arms (e.g., wheelchair or bedside chair)?: A Little Help needed to walk in hospital room?: A Little Help needed climbing 3-5 steps with a railing? : A Lot 6 Click Score: 17  End of Session Equipment Utilized During Treatment: Gait belt Activity Tolerance: Patient tolerated treatment well Patient left: in chair;with call bell/phone within reach;with chair alarm set;with SCD's reapplied Nurse Communication: Mobility status;Patient requests pain meds PT Visit Diagnosis: Unsteadiness on feet (R26.81);Muscle weakness (generalized) (M62.81);Difficulty in walking, not elsewhere classified (R26.2);Pain Pain - Right/Left: Right Pain - part of body: Knee    Time: 1452-1535 PT Time Calculation (min) (ACUTE ONLY): 43 min   Charges:   PT Evaluation $PT Eval Moderate Complexity: 1 Mod PT Treatments $Therapeutic Exercise: 23-37 mins PT General Charges $$ ACUTE PT  VISIT: 1 Visit        Janell Axe, SPT

## 2024-09-18 NOTE — Plan of Care (Signed)

## 2024-09-19 ENCOUNTER — Other Ambulatory Visit: Payer: Self-pay

## 2024-09-19 DIAGNOSIS — M1711 Unilateral primary osteoarthritis, right knee: Secondary | ICD-10-CM | POA: Diagnosis not present

## 2024-09-19 MED ORDER — MUPIROCIN 2 % EX OINT
1.0000 | TOPICAL_OINTMENT | Freq: Two times a day (BID) | CUTANEOUS | 0 refills | Status: AC
  Filled 2024-09-19: qty 22, 30d supply, fill #0

## 2024-09-19 MED ORDER — OXYCODONE HCL 5 MG PO TABS
5.0000 mg | ORAL_TABLET | ORAL | 0 refills | Status: AC | PRN
  Filled 2024-09-19: qty 30, 5d supply, fill #0

## 2024-09-19 MED ORDER — TRAMADOL HCL 50 MG PO TABS
50.0000 mg | ORAL_TABLET | ORAL | 0 refills | Status: AC | PRN
  Filled 2024-09-19: qty 30, 4d supply, fill #0

## 2024-09-19 MED ORDER — CHLORHEXIDINE GLUCONATE 4 % EX SOLN
1.0000 | CUTANEOUS | 1 refills | Status: AC
  Filled 2024-09-19: qty 236, 10d supply, fill #0

## 2024-09-19 MED ADMIN — Metoclopramide HCl Tab 5 MG (Base Equivalent): 10 mg | ORAL | NDC 51079088601

## 2024-09-19 MED ADMIN — Brimonidine Tartrate Ophth Soln 0.2%: 1 [drp] | OPHTHALMIC | NDC 24208041105

## 2024-09-19 MED ADMIN — Acetaminophen IV Soln 10 MG/ML: 1000 mg | INTRAVENOUS | NDC 63323043441

## 2024-09-19 NOTE — Plan of Care (Signed)

## 2024-09-19 NOTE — Anesthesia Postprocedure Evaluation (Signed)
 Anesthesia Post Note  Patient: NARY SNEED  Procedure(s) Performed: ARTHROPLASTY, KNEE, TOTAL, USING IMAGELESS COMPUTER-ASSISTED NAVIGATION (Right: Knee)  Patient location during evaluation: Nursing Unit Anesthesia Type: Spinal Level of consciousness: oriented and awake and alert Pain management: pain level controlled Vital Signs Assessment: post-procedure vital signs reviewed and stable Respiratory status: spontaneous breathing and respiratory function stable Cardiovascular status: blood pressure returned to baseline and stable Postop Assessment: no headache, no backache, no apparent nausea or vomiting and patient able to bend at knees Anesthetic complications: no   No notable events documented.   Last Vitals:  Vitals:   09/19/24 0450 09/19/24 0744  BP: 113/66 (!) 115/57  Pulse: 66 67  Resp: 18 17  Temp: 36.9 C 36.8 C  SpO2: 97% 97%    Last Pain:  Vitals:   09/19/24 0807  TempSrc:   PainSc: 3                  Prentice Murphy

## 2024-09-19 NOTE — Care Management Obs Status (Signed)
 MEDICARE OBSERVATION STATUS NOTIFICATION   Patient Details  Name: Lindsay [name] MRN: [id number] Date of Birth: Aug 02, 1941   Medicare Observation Status Notification Given:  No Patient checked out   [name] 09/19/2024, 1:10 PM

## 2024-09-19 NOTE — Progress Notes (Signed)
 Physical Therapy Treatment Patient Details Name: Lindsay [name] MRN: [id number] DOB: 06/18/1941 Today's Date: 09/19/2024   History of Present Illness Pt is an 83 y/o F s/p R TKA on 10/17. PMH significant for chicken pox, depression, GERD, HLD, HTN, hx of DVT on Eliquis .    PT Comments  Pt is making good progress towards goals and has met discharge goals. Pt dressed and excited to begin next phase of recovery. Able to ambulate long distance in hallway without breaks required. Performed stair training and education on HEP. Will continue to progress as able.    If plan is discharge home, recommend the following: A little help with walking and/or transfers;A little help with bathing/dressing/bathroom;Assistance with cooking/housework;Assist for transportation;Help with stairs or ramp for entrance   Can travel by private vehicle        Equipment Recommendations  Rolling walker (2 wheels);BSC/3in1    Recommendations for Other Services       Precautions / Restrictions Precautions Precautions: Knee;Fall Precaution Booklet Issued: Yes (comment) Recall of Precautions/Restrictions: Intact Restrictions Weight Bearing Restrictions Per Provider Order: Yes RLE Weight Bearing Per Provider Order: Weight bearing as tolerated     Mobility  Bed Mobility               General bed mobility comments: NT, received in recliner    Transfers Overall transfer level: Needs assistance Equipment used: Rolling walker (2 wheels) Transfers: Sit to/from Stand Sit to Stand: Supervision           General transfer comment: cues for hand placement    Ambulation/Gait Ambulation/Gait assistance: Contact guard assist Gait Distance (Feet): 300 Feet Assistive device: Rolling walker (2 wheels) Gait Pattern/deviations: Step-to pattern, Decreased stance time - right, Decreased weight shift to right, Narrow base of support       General Gait Details: narrow BOS. Cues for posture and looking forward  using RW. STep to gait pattern progressing to inconsistent reciprocal gait   Stairs Stairs: Yes Stairs assistance: Contact guard assist Stair Management: Two rails, Step to pattern Number of Stairs: 8 General stair comments: up/down with safe technique. Demonstrated prior to performance. Grand-daughter present and educated on Radio producer     Tilt Bed    Modified Rankin (Stroke Patients Only)       Balance Overall balance assessment: Mild deficits observed, not formally tested                                          Communication Communication Communication: No apparent difficulties  Cognition Arousal: Alert Behavior During Therapy: WFL for tasks assessed/performed   PT - Cognitive impairments: No apparent impairments                       PT - Cognition Comments: A&Ox4 Following commands: Intact      Cueing Cueing Techniques: Verbal cues, Tactile cues, Visual cues  Exercises Total Joint Exercises Goniometric ROM: R knee AAROM: 0-100 degrees Other Exercises Other Exercises: education provided on HEP, polar care, home safety    General Comments General comments (skin integrity, edema, etc.): Education provided on ADL/IADL safety at home; nighttime safety; nonskid footwear.      Pertinent Vitals/Pain Pain Assessment Pain Assessment: 0-10 Pain Score: 5  Pain Location: R knee Pain Descriptors / Indicators: Aching, Operative site guarding, Sore Pain Intervention(s): Limited activity within patient's tolerance, Repositioned,  Ice applied, Monitored during session    Home Living Family/patient expects to be discharged to:: Private residence Living Arrangements: Alone (granddaughter to stay with her for a few weeks at d/c) Available Help at Discharge: Family;Available 24 hours/day Type of Home: House Home Access: Stairs to enter Entrance Stairs-Rails: Right;Left;Can reach both Entrance Stairs-Number of Steps: 6    Home Layout: One level Home Equipment: None Additional Comments: Pt's grand-daghter reports she will be living with patient temporarily to assist her at d/c    Prior Function            PT Goals (current goals can now be found in the care plan section) Acute Rehab PT Goals Patient Stated Goal: to go home PT Goal Formulation: With patient Time For Goal Achievement: 10/02/24 Potential to Achieve Goals: Fair Progress towards PT goals: Progressing toward goals    Frequency    BID      PT Plan      Co-evaluation              AM-PAC PT 6 Clicks Mobility   Outcome Measure  Help needed turning from your back to your side while in a flat bed without using bedrails?: A Little Help needed moving from lying on your back to sitting on the side of a flat bed without using bedrails?: A Little Help needed moving to and from a bed to a chair (including a wheelchair)?: A Little Help needed standing up from a chair using your arms (e.g., wheelchair or bedside chair)?: A Little Help needed to walk in hospital room?: A Little Help needed climbing 3-5 steps with a railing? : A Little 6 Click Score: 18    End of Session Equipment Utilized During Treatment: Gait belt Activity Tolerance: Patient tolerated treatment well Patient left: in chair;with call bell/phone within reach;with chair alarm set;with SCD's reapplied Nurse Communication: Mobility status;Patient requests pain meds PT Visit Diagnosis: Unsteadiness on feet (R26.81);Muscle weakness (generalized) (M62.81);Difficulty in walking, not elsewhere classified (R26.2);Pain Pain - Right/Left: Right Pain - part of body: Knee     Time: 8958-8885 PT Time Calculation (min) (ACUTE ONLY): 33 min  Charges:    $Gait Training: 8-22 mins $Therapeutic Exercise: 8-22 mins PT General Charges $$ ACUTE PT VISIT: 1 Visit                     Corean Dade, PT, DPT, GCS [phone]    Yeslin Delio 09/19/2024, 12:31 PM

## 2024-09-19 NOTE — TOC Transition Note (Signed)
 Transition of Care [facility]) - Discharge Note   Patient Details  Name: Lindsay [name] MRN: [id number] Date of Birth: Jun 28, 1941  [facility] CM/SW Contact:  Marinda Cooks, RN Phone Number: 09/19/2024, 10:09 AM   Clinical Narrative:    This CM updated by covering MD pt medically cleared to dc today and has active DC order .  HH arranged with [facility] and DME coordinated with and BSC to be delivered to pt's rm.   DC transportation confirmed for pt with family. Medical team updated . No additional DC needs requested by medical team or identified by CM at this time .     Final next level of care: Home w Home Health Services Barriers to Discharge: No Barriers Identified   Patient Goals and CMS Choice Patient states their goals for this hospitalization and ongoing recovery are:: To return Home with Home Health for PT CMS Medicare.gov Compare Post Acute Care list provided to:: Patient Choice offered to / list presented to : Patient      Discharge Placement                  Name of family member notified: Patient Patient and family notified of of transfer: 09/19/24  Discharge Plan and Services Additional resources added to the After Visit Summary for                  DME Arranged: 3-N-1 DME Agency: [facility] Date DME Agency Contacted: 09/19/24 Time DME Agency Contacted: 1007 Representative spoke with at DME Agency: [facility] HH Arranged: PT, OT, Speech Therapy HH Agency: [facility] Date [facility] Agency Contacted: 09/19/24 Time HH Agency Contacted: 1008 Representative spoke with at [facility] Agency: [place]  Social Drivers of Health (SDOH) Interventions SDOH Screenings   Food Insecurity: No Food Insecurity (09/14/2024)   Received from [facility]  Housing: Low Risk  (09/14/2024)   Received from [facility]  Transportation Needs: No Transportation Needs (09/14/2024)   Received from [facility]   Utilities: Not At Risk (09/14/2024)   Received from [facility]  Financial Resource Strain: Low Risk  (09/14/2024)   Received from [facility]  Tobacco Use: High Risk (09/18/2024)     Readmission Risk Interventions     No data to display

## 2024-09-19 NOTE — Evaluation (Signed)
 Occupational Therapy Evaluation Patient Details Name: Lindsay [name] MRN: [id number] DOB: 1941-06-04 Today's Date: 09/19/2024   History of Present Illness   Pt is an 83 y/o F s/p R TKA on 10/17. PMH significant for chicken pox, depression, GERD, HLD, HTN, hx of DVT on Eliquis .     Clinical Impressions Patient received for OT evaluation. See flowsheet below for details of function. Generally, patient requiring MIN A for bed mobility, supervision with RW for functional mobility, and set up-MIN A for ADLs. All education provided; see below.  Patient with no further need for OT in acute care; discharge OT services.      If plan is discharge home, recommend the following:   A little help with walking and/or transfers;A little help with bathing/dressing/bathroom;Assistance with cooking/housework;Assist for transportation;Help with stairs or ramp for entrance     Functional Status Assessment   Patient has had a recent decline in their functional status and demonstrates the ability to make significant improvements in function in a reasonable and predictable amount of time.     Equipment Recommendations   BSC/3in1     Recommendations for Other Services         Precautions/Restrictions   Precautions Precautions: Knee;Fall Recall of Precautions/Restrictions: Intact Restrictions RLE Weight Bearing Per Provider Order: Weight bearing as tolerated     Mobility Bed Mobility Overal bed mobility: Needs Assistance Bed Mobility: Supine to Sit     Supine to sit: Min assist     General bed mobility comments: MIN A for getting RLE out of foam knee extension holder    Transfers Overall transfer level: Needs assistance Equipment used: Rolling walker (2 wheels) Transfers: Sit to/from Stand Sit to Stand: Supervision           General transfer comment: cues for STS hand placement      Balance Overall balance assessment: Mild deficits observed, not formally tested  (using RW during session. Able to use BIL UE while statically standing for ADLs)                                         ADL either performed or assessed with clinical judgement   ADL Overall ADL's : Needs assistance/impaired Eating/Feeding: Set up   Grooming: Set up;Supervision/safety;Standing Grooming Details (indicate cue type and reason): oral care at sink in standing Upper Body Bathing: Supervision/ safety;Set up;Standing Upper Body Bathing Details (indicate cue type and reason): standing at sink for washing face today Lower Body Bathing: Supervison/ safety;Set up;Sit to/from stand Lower Body Bathing Details (indicate cue type and reason): standing at sink for bathing peri area today Upper Body Dressing : Set up;Sitting   Lower Body Dressing: Minimal assistance;Sit to/from stand Lower Body Dressing Details (indicate cue type and reason): mostly sitting; stood for pulling up pants (supervision); needed assist with R shoe.   Toilet Transfer Details (indicate cue type and reason): discussed toilet transfer; will have BSC over bed at home Toileting- Clothing Manipulation and Hygiene: Set up     Tub/Shower Transfer Details (indicate cue type and reason): no showering for 2 weeks, per surgeon (per patient report) Functional mobility during ADLs: Supervision/safety;Rolling walker (2 wheels) General ADL Comments: Walked around the bed to the sink (approx 12 feet) and back to recliner during session.     Vision Baseline Vision/History: 1 Wears glasses Ability to See in Adequate Light: 0 Adequate Patient Visual Report: No  change from baseline       Perception         Praxis         Pertinent Vitals/Pain Pain Assessment Pain Assessment: 0-10 Pain Score: 3  Pain Location: R knee Pain Descriptors / Indicators: Aching, Operative site guarding, Sore Pain Intervention(s): Limited activity within patient's tolerance     Extremity/Trunk Assessment Upper  Extremity Assessment Upper Extremity Assessment: Overall WFL for tasks assessed   Lower Extremity Assessment Lower Extremity Assessment: RLE deficits/detail RLE Deficits / Details: R TKA   Cervical / Trunk Assessment Cervical / Trunk Assessment: Normal   Communication Communication Communication: No apparent difficulties   Cognition Arousal: Alert Behavior During Therapy: WFL for tasks assessed/performed                                 Following commands: Intact       Cueing  General Comments   Cueing Techniques: Verbal cues;Tactile cues;Visual cues  Education provided on ADL/IADL safety at home; nighttime safety; nonskid footwear.   Exercises     Shoulder Instructions      Home Living Family/patient expects to be discharged to:: Private residence Living Arrangements: Alone (granddaughter to stay with her for a few weeks at d/c) Available Help at Discharge: Family;Available 24 hours/day Type of Home: House Home Access: Stairs to enter [employer] of Steps: 6 Entrance Stairs-Rails: Right;Left;Can reach both Home Layout: One level     Bathroom Shower/Tub: Chief Strategy Officer: Handicapped height     Home Equipment: None   Additional Comments: Pt's grand-daghter reports she will be living with patient temporarily to assist her at d/c      Prior Functioning/Environment Prior Level of Function : Independent/Modified Independent             Mobility Comments: Pt reports being IND with mobility, no previous AD use, denies hx of falls, states that she is mostly a household ambulator ADLs Comments: IND with ADLs    OT Problem List:     OT Treatment/Interventions:        OT Goals(Current goals can be found in the care plan section)   Acute Rehab OT Goals Patient Stated Goal: Go home OT Goal Formulation: All assessment and education complete, DC therapy   OT Frequency:       Co-evaluation               AM-PAC OT 6 Clicks Daily Activity     Outcome Measure Help from another person eating meals?: None Help from another person taking care of personal grooming?: None Help from another person toileting, which includes using toliet, bedpan, or urinal?: None Help from another person bathing (including washing, rinsing, drying)?: None Help from another person to put on and taking off regular upper body clothing?: None Help from another person to put on and taking off regular lower body clothing?: A Little 6 Click Score: 23   End of Session Equipment Utilized During Treatment: Rolling walker (2 wheels) Nurse Communication: Mobility status  Activity Tolerance: Patient tolerated treatment well Patient left: in chair;with family/visitor present (granddaughter arrived at end of session; RN in room for most of session)                   Time: 1000-1045 OT Time Calculation (min): 45 min Charges:  OT General Charges $OT Visit: 1 Visit OT Evaluation $OT Eval Moderate Complexity: 1 Mod  OT Treatments $Self Care/Home Management : 23-37 mins  Jasmine Arlean Shams, MS, OTR/L  Jasmine Shams 09/19/2024, 10:47 AM

## 2024-09-19 NOTE — TOC CM/SW Note (Signed)
 Patient is not able to walk the distance required to go the bathroom, or he/she is unable to safely negotiate stairs required to access the bathroom.  A 3in1 BSC will alleviate this problem

## 2024-09-21 ENCOUNTER — Encounter: Payer: Self-pay | Admitting: Orthopedic Surgery

## 2024-12-30 ENCOUNTER — Inpatient Hospital Stay
Admission: EM | Admit: 2024-12-30 | Discharge: 2025-01-01 | DRG: 271 | Disposition: A | Discharge status: Home / Self Care | Attending: Internal Medicine | Admitting: Internal Medicine

## 2024-12-30 ENCOUNTER — Other Ambulatory Visit: Payer: Self-pay

## 2024-12-30 ENCOUNTER — Telehealth (INDEPENDENT_AMBULATORY_CARE_PROVIDER_SITE_OTHER): Payer: Self-pay

## 2024-12-30 ENCOUNTER — Emergency Department

## 2024-12-30 DIAGNOSIS — Z96651 Presence of right artificial knee joint: Secondary | ICD-10-CM | POA: Diagnosis present

## 2024-12-30 DIAGNOSIS — T82599A Other mechanical complication of unspecified cardiac and vascular devices and implants, initial encounter: Secondary | ICD-10-CM | POA: Diagnosis not present

## 2024-12-30 DIAGNOSIS — Z9842 Cataract extraction status, left eye: Secondary | ICD-10-CM

## 2024-12-30 DIAGNOSIS — K219 Gastro-esophageal reflux disease without esophagitis: Secondary | ICD-10-CM | POA: Diagnosis present

## 2024-12-30 DIAGNOSIS — E785 Hyperlipidemia, unspecified: Secondary | ICD-10-CM | POA: Diagnosis not present

## 2024-12-30 DIAGNOSIS — Y92009 Unspecified place in unspecified non-institutional (private) residence as the place of occurrence of the external cause: Secondary | ICD-10-CM | POA: Diagnosis not present

## 2024-12-30 DIAGNOSIS — T45516A Underdosing of anticoagulants, initial encounter: Secondary | ICD-10-CM | POA: Diagnosis present

## 2024-12-30 DIAGNOSIS — Z5941 Food insecurity: Secondary | ICD-10-CM | POA: Diagnosis not present

## 2024-12-30 DIAGNOSIS — I739 Peripheral vascular disease, unspecified: Secondary | ICD-10-CM | POA: Diagnosis present

## 2024-12-30 DIAGNOSIS — Z8249 Family history of ischemic heart disease and other diseases of the circulatory system: Secondary | ICD-10-CM

## 2024-12-30 DIAGNOSIS — Z59868 Other specified financial insecurity: Secondary | ICD-10-CM | POA: Diagnosis not present

## 2024-12-30 DIAGNOSIS — F17210 Nicotine dependence, cigarettes, uncomplicated: Secondary | ICD-10-CM | POA: Diagnosis present

## 2024-12-30 DIAGNOSIS — Z90710 Acquired absence of both cervix and uterus: Secondary | ICD-10-CM | POA: Diagnosis not present

## 2024-12-30 DIAGNOSIS — I10 Essential (primary) hypertension: Secondary | ICD-10-CM | POA: Diagnosis present

## 2024-12-30 DIAGNOSIS — E039 Hypothyroidism, unspecified: Secondary | ICD-10-CM | POA: Diagnosis present

## 2024-12-30 DIAGNOSIS — Z833 Family history of diabetes mellitus: Secondary | ICD-10-CM | POA: Diagnosis not present

## 2024-12-30 DIAGNOSIS — R54 Age-related physical debility: Secondary | ICD-10-CM | POA: Diagnosis present

## 2024-12-30 DIAGNOSIS — M79605 Pain in left leg: Secondary | ICD-10-CM | POA: Diagnosis present

## 2024-12-30 DIAGNOSIS — Z5982 Transportation insecurity: Secondary | ICD-10-CM

## 2024-12-30 DIAGNOSIS — Z886 Allergy status to analgesic agent status: Secondary | ICD-10-CM

## 2024-12-30 DIAGNOSIS — I743 Embolism and thrombosis of arteries of the lower extremities: Secondary | ICD-10-CM | POA: Diagnosis present

## 2024-12-30 DIAGNOSIS — I82402 Acute embolism and thrombosis of unspecified deep veins of left lower extremity: Secondary | ICD-10-CM

## 2024-12-30 DIAGNOSIS — E7800 Pure hypercholesterolemia, unspecified: Secondary | ICD-10-CM | POA: Diagnosis present

## 2024-12-30 DIAGNOSIS — Z79890 Hormone replacement therapy (postmenopausal): Secondary | ICD-10-CM

## 2024-12-30 DIAGNOSIS — Z86718 Personal history of other venous thrombosis and embolism: Secondary | ICD-10-CM | POA: Diagnosis not present

## 2024-12-30 DIAGNOSIS — Z7901 Long term (current) use of anticoagulants: Secondary | ICD-10-CM

## 2024-12-30 DIAGNOSIS — Y831 Surgical operation with implant of artificial internal device as the cause of abnormal reaction of the patient, or of later complication, without mention of misadventure at the time of the procedure: Secondary | ICD-10-CM | POA: Diagnosis present

## 2024-12-30 DIAGNOSIS — Z880 Allergy status to penicillin: Secondary | ICD-10-CM

## 2024-12-30 DIAGNOSIS — Z91128 Patient's intentional underdosing of medication regimen for other reason: Secondary | ICD-10-CM

## 2024-12-30 DIAGNOSIS — Z79899 Other long term (current) drug therapy: Secondary | ICD-10-CM | POA: Diagnosis not present

## 2024-12-30 DIAGNOSIS — T82868A Thrombosis of vascular prosthetic devices, implants and grafts, initial encounter: Principal | ICD-10-CM | POA: Diagnosis present

## 2024-12-30 DIAGNOSIS — I825Y2 Chronic embolism and thrombosis of unspecified deep veins of left proximal lower extremity: Secondary | ICD-10-CM | POA: Diagnosis not present

## 2024-12-30 DIAGNOSIS — Z961 Presence of intraocular lens: Secondary | ICD-10-CM | POA: Diagnosis present

## 2024-12-30 DIAGNOSIS — I70222 Atherosclerosis of native arteries of extremities with rest pain, left leg: Secondary | ICD-10-CM | POA: Diagnosis present

## 2024-12-30 DIAGNOSIS — Z720 Tobacco use: Secondary | ICD-10-CM

## 2024-12-30 DIAGNOSIS — Z9841 Cataract extraction status, right eye: Secondary | ICD-10-CM

## 2024-12-30 DIAGNOSIS — E876 Hypokalemia: Secondary | ICD-10-CM

## 2024-12-30 DIAGNOSIS — T82856A Stenosis of peripheral vascular stent, initial encounter: Secondary | ICD-10-CM

## 2024-12-30 LAB — CBC
HCT: 35.6 % — ABNORMAL LOW (ref 36.0–46.0)
Hemoglobin: 12.9 g/dL (ref 12.0–15.0)
MCH: 29.1 pg (ref 26.0–34.0)
MCHC: 36.2 g/dL — ABNORMAL HIGH (ref 30.0–36.0)
MCV: 80.2 fL (ref 80.0–100.0)
Platelets: 237 10*3/uL (ref 150–400)
RBC: 4.44 MIL/uL (ref 3.87–5.11)
RDW: 15.9 % — ABNORMAL HIGH (ref 11.5–15.5)
WBC: 6.9 10*3/uL (ref 4.0–10.5)
nRBC: 0 % (ref 0.0–0.2)

## 2024-12-30 LAB — BASIC METABOLIC PANEL WITH GFR
Anion gap: 12 (ref 5–15)
BUN: 11 mg/dL (ref 8–23)
CO2: 26 mmol/L (ref 22–32)
Calcium: 9.1 mg/dL (ref 8.9–10.3)
Chloride: 105 mmol/L (ref 98–111)
Creatinine, Ser: 0.77 mg/dL (ref 0.44–1.00)
GFR, Estimated: >60 mL/min
Glucose, Bld: 104 mg/dL — ABNORMAL HIGH (ref 70–99)
Potassium: 3.8 mmol/L (ref 3.5–5.1)
Sodium: 144 mmol/L (ref 135–145)

## 2024-12-30 LAB — TSH: TSH: 0.336 u[IU]/mL — ABNORMAL LOW (ref 0.350–4.500)

## 2024-12-30 MED ORDER — PANTOPRAZOLE SODIUM 40 MG PO TBEC
40.0000 mg | DELAYED_RELEASE_TABLET | Freq: Every day | ORAL | Status: DC
  Administered 2025-01-01: 40 mg via ORAL
  Filled 2024-12-30: qty 1

## 2024-12-30 MED ORDER — LEVOTHYROXINE SODIUM 25 MCG PO TABS
25.0000 ug | ORAL_TABLET | Freq: Every day | ORAL | Status: DC
  Administered 2024-12-31 – 2025-01-01 (×2): 25 ug via ORAL
  Filled 2024-12-30 (×2): qty 1

## 2024-12-30 MED ORDER — ONDANSETRON HCL 4 MG/2ML IJ SOLN: 4.0000 mg | Freq: Four times a day (QID) | INTRAMUSCULAR | Status: DC | PRN

## 2024-12-30 MED ORDER — ONDANSETRON HCL 4 MG PO TABS: 4.0000 mg | ORAL_TABLET | Freq: Four times a day (QID) | ORAL | Status: DC | PRN

## 2024-12-30 MED ORDER — BISOPROLOL-HYDROCHLOROTHIAZIDE 10-6.25 MG PO TABS
1.0000 | ORAL_TABLET | Freq: Every day | ORAL | Status: DC
  Filled 2024-12-30: qty 1

## 2024-12-30 MED ORDER — ATORVASTATIN CALCIUM 10 MG PO TABS: 10.0000 mg | ORAL_TABLET | Freq: Every day | ORAL | Status: DC

## 2024-12-30 MED ORDER — TRAMADOL HCL 50 MG PO TABS
50.0000 mg | ORAL_TABLET | ORAL | Status: DC | PRN
  Administered 2024-12-30: 100 mg via ORAL
  Administered 2025-01-01: 50 mg via ORAL
  Filled 2024-12-30: qty 1
  Filled 2024-12-30: qty 2

## 2024-12-30 MED ORDER — LOSARTAN POTASSIUM 50 MG PO TABS
100.0000 mg | ORAL_TABLET | Freq: Every day | ORAL | Status: DC
  Administered 2025-01-01: 100 mg via ORAL
  Filled 2024-12-30: qty 2

## 2024-12-30 MED ORDER — ACETAMINOPHEN 325 MG PO TABS: 650.0000 mg | ORAL_TABLET | Freq: Four times a day (QID) | ORAL | Status: DC | PRN

## 2024-12-30 MED ORDER — AMLODIPINE BESYLATE 10 MG PO TABS
10.0000 mg | ORAL_TABLET | Freq: Every day | ORAL | Status: DC
  Administered 2025-01-01: 10 mg via ORAL
  Filled 2024-12-30: qty 1

## 2024-12-30 MED ORDER — ACETAMINOPHEN 650 MG RE SUPP: 650.0000 mg | Freq: Four times a day (QID) | RECTAL | Status: DC | PRN

## 2024-12-30 NOTE — H&P (Signed)
 " History and Physical    Patient: Lindsay [name] FMW:[id number] DOB: 06/11/1941 DOA: 12/30/2024 DOS: the patient was seen and examined on 12/30/2024 PCP: Alla Amis, MD  Patient coming from: Home  Chief Complaint:  Chief Complaint  Patient presents with   Leg Pain   HPI: LAVERE SHINSKY is a 84 y.o. female with medical history significant of PAD s/p L SFA stent, DVT on Eliquis , hypertension, hyperlipidemia, current smoker, Presents to the emergency department for evaluation of intermittent, nonradiating left calf pain she noted upon awakening this morning.  She also notes the leg is cool.  she is unable to identify a relieving or exacerbating factor.  She denies any discoloration, numbness, tingling, swelling ,fevers, CP, SOB.  She admits to missing her DOAC on Monday.   In the emergency department, she was hemodynamically stable.  No evidence of critical limb ischemia on exam.  Bilateral lower extremity Dopplers reveal SFA stent occlusion.  Vascular surgery was consulted in the emergency department and recommended admission on heparin  drip and plan for angiogram tomorrow  Review of Systems: Review of Systems  Constitutional:  Negative for chills, fever, malaise/fatigue and weight loss.  Respiratory:  Negative for cough and shortness of breath.   Cardiovascular:  Positive for claudication. Negative for chest pain, palpitations and leg swelling.  Gastrointestinal:  Negative for abdominal pain, blood in stool, constipation, diarrhea, heartburn, nausea and vomiting.  Genitourinary:  Negative for dysuria and hematuria.  Musculoskeletal:  Negative for back pain, falls, joint pain, myalgias and neck pain.  Skin:  Negative for itching and rash.  Neurological:  Negative for dizziness, tingling, sensory change, focal weakness and loss of consciousness.    Past Medical History:  Diagnosis Date   Anemia    Anxiety    Arthritis of back    Deep vein thrombosis (DVT) of left lower  extremity (HCC) 12/2019   Depression    Diverticulosis    GERD (gastroesophageal reflux disease)    Hematochezia    Hypertension    Hypothyroidism    PAD (peripheral artery disease)    Wears dentures    Full upper   Past Surgical History:  Procedure Laterality Date   ABDOMINAL HYSTERECTOMY     CARDIAC CATHETERIZATION  1995   normal, Dr Florencio   CATARACT EXTRACTION W/PHACO Right 10/03/2022   Procedure: CATARACT EXTRACTION PHACO AND INTRAOCULAR LENS PLACEMENT (IOC) RIGHT 9.96 01:10.5;  Surgeon: Mittie Gaskin, MD;  Location: [facility];  Service: Ophthalmology;  Laterality: Right;   CATARACT EXTRACTION W/PHACO Left 10/17/2022   Procedure: CATARACT EXTRACTION PHACO AND INTRAOCULAR LENS PLACEMENT (IOC) LEFT malyugin  7.56  01:03.5;  Surgeon: Mittie Gaskin, MD;  Location: [facility];  Service: Ophthalmology;  Laterality: Left;   COLONOSCOPY  12/29/08   COLONOSCOPY N/A 04/18/2015   Procedure: COLONOSCOPY;  Surgeon: Rogelia Copping, MD;  Location: [facility];  Service: Gastroenterology;  Laterality: N/A;  cecum time- 0957   FOOT SURGERY Right    KNEE ARTHROPLASTY Right 09/18/2024   Procedure: ARTHROPLASTY, KNEE, TOTAL, USING IMAGELESS COMPUTER-ASSISTED NAVIGATION;  Surgeon: Mardee Lynwood SQUIBB, MD;  Location: [facility];  Service: Orthopedics;  Laterality: Right;   KNEE ARTHROSCOPY Right 10/05/2015   Procedure: ARTHROSCOPY KNEE, partial lateral menisectomy;  Surgeon: Kayla Pinal, MD;  Location: [facility];  Service: Orthopedics;  Laterality: Right;   LOWER EXTREMITY ANGIOGRAPHY Left 02/27/2018   Procedure: LOWER EXTREMITY ANGIOGRAPHY;  Surgeon: Marea Selinda RAMAN, MD;  Location: [facility];  Service: Cardiovascular;  Laterality: Left;  LOWER EXTREMITY ANGIOGRAPHY Left 10/15/2018   Procedure: LOWER EXTREMITY ANGIOGRAPHY;  Surgeon: Marea Selinda RAMAN, MD;  Location: [facility];  Service: Cardiovascular;  Laterality: Left;   LOWER EXTREMITY ANGIOGRAPHY Left  12/29/2018   Procedure: LOWER EXTREMITY ANGIOGRAPHY;  Surgeon: Marea Selinda RAMAN, MD;  Location: [facility];  Service: Cardiovascular;  Laterality: Left;   LOWER EXTREMITY ANGIOGRAPHY Left 12/23/2019   Procedure: LOWER EXTREMITY ANGIOGRAPHY;  Surgeon: Marea Selinda RAMAN, MD;  Location: [facility];  Service: Cardiovascular;  Laterality: Left;   LOWER EXTREMITY ANGIOGRAPHY Left 12/24/2019   Procedure: Lower Extremity Angiography;  Surgeon: Marea Selinda RAMAN, MD;  Location: [facility];  Service: Cardiovascular;  Laterality: Left;   POLYPECTOMY  04/18/2015   Procedure: POLYPECTOMY INTESTINAL;  Surgeon: Rogelia Copping, MD;  Location: [facility];  Service: Gastroenterology;;   WRIST SURGERY Right    Social History:  reports that she has been smoking cigarettes. She started smoking about 36 years ago. She has a 15 pack-year smoking history. She has never used smokeless tobacco. She reports current alcohol use. She reports that she does not use drugs.  Allergies[1]  Family History  Problem Relation Age of Onset   Diabetes Mother    Hypertension Mother    Diabetes Sister    Hypertension Sister     Prior to Admission medications  Medication Sig Start Date End Date Taking? Authorizing Provider  acetaminophen  (TYLENOL ) 500 MG tablet Take 1,000 mg by mouth daily.    [provider]  amLODipine  (NORVASC ) 10 MG tablet Take 10 mg by mouth daily. 01/18/21   [provider]  atorvastatin  (LIPITOR) 10 MG tablet Take 10 mg by mouth daily. 10/12/21   [provider]  bisoprolol -hydrochlorothiazide  (ZIAC ) 10-6.25 MG tablet TAKE 1 TABLET BY MOUTH DAILY 08/25/18   Dineen Rollene MATSU, FNP  chlorhexidine  (HIBICLENS ) 4 % external liquid Apply 15 mLs (1 Application total) topically as directed for 30 doses. Use as directed daily for 5 days every other week for 6 weeks. 09/19/24   Drake Chew, PA-C  COMBIGAN  0.2-0.5 % ophthalmic solution Place 1 drop into both eyes every  12 (twelve) hours. 04/20/24   [provider]  DiphenhydrAMINE  HCl (ALKA-SELTZER PLUS ALLERGY PO) Take 1 tablet by mouth daily as needed (allergies). Patient not taking: Reported on 09/18/2024    [provider]  ELIQUIS  5 MG TABS tablet TAKE 1 TABLET BY MOUTH TWICE A DAY 03/11/24   Marea Selinda RAMAN, MD  levothyroxine  (SYNTHROID ) 25 MCG tablet Take 25 mcg by mouth daily before breakfast. 09/03/24   [provider]  losartan  (COZAAR ) 100 MG tablet TAKE ONE (1) TABLET BY MOUTH EVERY DAY 07/17/17   Dineen Rollene MATSU, FNP  Multiple Vitamins-Minerals (MULTI COMPLETE PO) Take 1 tablet by mouth daily.     [provider]  nicotine  (NICODERM CQ  - DOSED IN MG/24 HOURS) 21 mg/24hr patch Place 1 patch (21 mg total) onto the skin daily. Patient not taking: Reported on 09/03/2024 12/25/19   Stegmayer, Suzen A, PA-C  oxyCODONE  (OXY IR/ROXICODONE ) 5 MG immediate release tablet Take 1 tablet (5 mg total) by mouth every 4 (four) hours as needed for moderate pain (pain score 4-6) (pain score 4-6). 09/19/24   Drake Chew, PA-C  pantoprazole  (PROTONIX ) 20 MG tablet Take 20 mg by mouth daily. 07/14/24   [provider]  pantoprazole  (PROTONIX ) 40 MG tablet TAKE ONE (1) TABLET EACH DAY Patient not taking: Reported on 09/18/2024 07/17/17  Dineen Rollene MATSU, FNP  traMADol  (ULTRAM ) 50 MG tablet Take 1-2 tablets (50-100 mg total) by mouth every 4 (four) hours as needed for moderate pain (pain score 4-6). 09/19/24   Drake Chew, PA-C    Physical Exam: Vitals:   12/30/24 1753 12/30/24 1759 12/30/24 2000 12/30/24 2159  BP:  (!) 166/82 (!) 148/58 (!) 161/78  Pulse: 67 77 66 80  Resp:  16  16  Temp:  98.1 F (36.7 C)  97.7 F (36.5 C)  TempSrc:  Oral    SpO2: 100% 98% 99% 100%  Weight:       Physical Exam Vitals and nursing note reviewed.  Constitutional:      General: She is not in acute distress.    Appearance: She is not ill-appearing or toxic-appearing.  HENT:      Head: Normocephalic and atraumatic.     Mouth/Throat:     Mouth: Mucous membranes are moist.     Pharynx: Oropharynx is clear.  Eyes:     General: No scleral icterus.    Extraocular Movements: Extraocular movements intact.     Pupils: Pupils are equal, round, and reactive to light.  Cardiovascular:     Rate and Rhythm: Normal rate and regular rhythm.  Pulmonary:     Effort: Pulmonary effort is normal. No respiratory distress.     Breath sounds: No wheezing or rales.  Abdominal:     General: There is no distension.     Palpations: Abdomen is soft.     Tenderness: There is no abdominal tenderness.  Musculoskeletal:        General: No swelling, tenderness or deformity. Normal range of motion.     Cervical back: Normal range of motion and neck supple.     Right lower leg: No edema.     Left lower leg: No edema.     Comments: B/l feet and calves b/l warm  No discoloration/palor or swelling in LLE No tenderness to palpation  Left foot NVI   Skin:    General: Skin is warm and dry.     Coloration: Skin is not jaundiced.     Findings: No bruising.  Neurological:     General: No focal deficit present.     Mental Status: She is alert and oriented to person, place, and time. Mental status is at baseline.     Sensory: No sensory deficit.     Motor: No weakness.  Psychiatric:        Mood and Affect: Mood normal.        Behavior: Behavior normal.     Data Reviewed:   Labs on Admission: I have personally reviewed following labs and imaging studies  CBC: Recent Labs  Lab 12/30/24 1409  WBC 6.9  HGB 12.9  HCT 35.6*  MCV 80.2  PLT 237   Basic Metabolic Panel: Recent Labs  Lab 12/30/24 1409  NA 144  K 3.8  CL 105  CO2 26  GLUCOSE 104*  BUN 11  CREATININE 0.77  CALCIUM  9.1   GFR: Estimated Creatinine Clearance: 47.6 mL/min (by C-G formula based on SCr of 0.77 mg/dL). Liver Function Tests: No results for input(s): AST, ALT, ALKPHOS, BILITOT, PROT,  ALBUMIN in the last 168 hours. No results for input(s): LIPASE, AMYLASE in the last 168 hours. No results for input(s): AMMONIA in the last 168 hours. Coagulation Profile: No results for input(s): INR, PROTIME in the last 168 hours. Cardiac Enzymes: No results for input(s): CKTOTAL, CKMB, CKMBINDEX, TROPONINI  in the last 168 hours. BNP (last 3 results) No results for input(s): PROBNP in the last 8760 hours. HbA1C: No results for input(s): HGBA1C in the last 72 hours. CBG: No results for input(s): GLUCAP in the last 168 hours. Lipid Profile: No results for input(s): CHOL, HDL, LDLCALC, TRIG, CHOLHDL, LDLDIRECT in the last 72 hours. Thyroid  Function Tests: No results for input(s): TSH, T4TOTAL, FREET4, T3FREE, THYROIDAB in the last 72 hours. Anemia Panel: No results for input(s): VITAMINB12, FOLATE, FERRITIN, TIBC, IRON, RETICCTPCT in the last 72 hours. Urine analysis:    Component Value Date/Time   COLORURINE YELLOW (A) 09/08/2024 1101   APPEARANCEUR CLEAR (A) 09/08/2024 1101   LABSPEC 1.018 09/08/2024 1101   PHURINE 5.0 09/08/2024 1101   GLUCOSEU NEGATIVE 09/08/2024 1101   HGBUR NEGATIVE 09/08/2024 1101   BILIRUBINUR NEGATIVE 09/08/2024 1101   KETONESUR NEGATIVE 09/08/2024 1101   PROTEINUR NEGATIVE 09/08/2024 1101   NITRITE NEGATIVE 09/08/2024 1101   LEUKOCYTESUR NEGATIVE 09/08/2024 1101    Radiological Exams on Admission: US  Venous Img Lower  Left (DVT Study) Result Date: 12/30/2024 EXAM: ULTRASOUND DUPLEX OF THE LEFT LOWER EXTREMITY VEINS TECHNIQUE: Duplex ultrasound using B-mode/gray scaled imaging and Doppler spectral analysis and color flow was obtained of the deep venous structures of the left lower extremity. COMPARISON: US  Left Lower Extremity 12/20/2019. CLINICAL HISTORY: Pain. FINDINGS: The common femoral vein, femoral vein, popliteal vein, and posterior tibial vein demonstrate normal compressibility with  normal color flow and spectral analysis. ARTERIAL FINDINGS: Incidental note is made of occlusion of the left superficial femoral arterial stent. Reconstitution of the distal popliteal artery is noted. IMPRESSION: 1. No evidence of DVT. 2. Occlusion of the left superficial femoral arterial stent with reconstitution of the distal popliteal artery. Electronically signed by: Oneil Devonshire MD 12/30/2024 06:16 PM EST RP Workstation: [id number]       Assessment and Plan:  PAD s/p L SFA stent Left SFA occlusion  - in the setting of missed eliquis  dosing on Monday, suspect breakthrough event 2/2 noncompliance rather than treatment failure.  Physical exam is reassuring - management per Vascular Surgery, assistance appreciated  - hold eliquis , cont heparin  gtt  - NPO  - plan for angiogram tomorrow  - pain control  - resume statin  - Neurovascular checks  Hx DVT on eliquis  - a/c as above.    Tobacco abuse  - declined nicotine  patch - cessation encouraged   HTN Hypothyroidism  - stable, home medications resumed   NPO midnight  No IVF  Heparin  gtt  Advance Care Planning:   Code Status: Full Code  discussed with patient at time of admission   Consults: Vascular surgery   Severity of Illness: The appropriate patient status for this patient is INPATIENT. Inpatient status is judged to be reasonable and necessary in order to provide the required intensity of service to ensure the patient's safety. The patient's presenting symptoms, physical exam findings, and initial radiographic and laboratory data in the context of their chronic comorbidities is felt to place them at high risk for further clinical deterioration. Furthermore, it is not anticipated that the patient will be medically stable for discharge from the hospital within 2 midnights of admission.   * I certify that at the point of admission it is my clinical judgment that the patient will require inpatient hospital care spanning beyond 2  midnights from the point of admission due to high intensity of service, high risk for further deterioration and high frequency of surveillance required.*  Author: Daved  JAYSON Pump, DO 12/30/2024 10:19 PM  For on call review [email or web address].      [1]  Allergies Allergen Reactions   Aspirin  Other (See Comments)    GI BLEED   Nsaids Other (See Comments)    GI BLEED   Penicillins Itching, Rash and Other (See Comments)    IgE = 134 (WNL) on 09/08/2024 Has patient had a PCN reaction causing immediate rash, facial/tongue/throat swelling, SOB or lightheadedness with hypotension: Yes Has patient had a PCN reaction causing severe rash involving mucus membranes or skin necrosis: No Has patient had a PCN reaction that required hospitalization No Has patient had a PCN reaction occurring within the last 10 years: No If all of the above answers are NO, then may proceed with Cephalosporin use.    Pollen Extract Other (See Comments)    Congestion, Eye Irritation   "

## 2024-12-30 NOTE — ED Triage Notes (Signed)
 Pt comes POV for left leg pain since this morning. Pt has hx of DVT and on blood thinners. No swelling or redness.

## 2024-12-30 NOTE — Consult Note (Signed)
 "                                   [facility]  Patient name: Lindsay [name] MRN: [id number] DOB: 02-17-1941 Sex: female   REQUESTING PROVIDER:    ER   REASON FOR CONSULT:    Leg pain  HISTORY OF PRESENT ILLNESS:   Lindsay [name] is a 84 y.o. female, who is has undergone the following vascular procedures:  02/27/2018: Angioplasty left anterior tibial artery, SFA and popliteal artery, stenting to the left SFA and popliteal 10/15/2018: Thrombolysis and thrombectomy of left SFA and popliteal artery with peroneal angioplasty and Viabahn stenting 12/29/2018: Angioplasty right common iliac artery 12/23/2019: Thrombolysis and mechanical thrombectomy of left SFA popliteal and tibioperoneal trunk with angioplasty 12/24/2019: Angioplasty left popliteal and posterior tibial artery   The patient presented to the emergency department with leg pain since this morning.  She reports not taking her medicine on Monday however has been taking her Eliquis  since then.  Her last dose was this morning.  Patient is medically managed for hypertension.  She is on anticoagulation with Eliquis  she is a smoker.  She takes a statin for hypercholesterolemia.  She is medically managed for hypertension. PAST MEDICAL HISTORY    Past Medical History:  Diagnosis Date   Anemia    Anxiety    Arthritis of back    Deep vein thrombosis (DVT) of left lower extremity (HCC) 12/2019   Depression    Diverticulosis    GERD (gastroesophageal reflux disease)    Hematochezia    Hypertension    Hypothyroidism    PAD (peripheral artery disease)    Wears dentures    Full upper     FAMILY HISTORY   Family History  Problem Relation Age of Onset   Diabetes Mother    Hypertension Mother    Diabetes Sister    Hypertension Sister     SOCIAL HISTORY:   Social History   Socioeconomic History   Marital status: Widowed    Spouse name: Not on file   Number of children: Not on file    Years of education: Not on file   Highest education level: Not on file  Occupational History   Not on file  Tobacco Use   Smoking status: Light Smoker    Current packs/day: 0.00    Average packs/day: 0.5 packs/day for 30.0 years (15.0 ttl pk-yrs)    Types: Cigarettes    Start date: 10/14/1988    Last attempt to quit: 10/14/2018    Years since quitting: 6.2   Smokeless tobacco: Never  Vaping Use   Vaping status: Never Used  Substance and Sexual Activity   Alcohol use: Yes    Alcohol/week: 0.0 standard drinks of alcohol    Comment: occassionally/ whiskey on weekend; total of 4 drinks per week   Drug use: No   Sexual activity: Not Currently  Other Topics Concern   Not on file  Social History Narrative   From [place].   [employer]- 20 hours week.    Husband in nursing home in [place].    1 child; 3 step children   Lives by herself, independent         Social Drivers of Health   Tobacco Use: High Risk (12/30/2024)   Patient History    Smoking Tobacco Use: Light Smoker    Smokeless  Tobacco Use: Never    Passive Exposure: Not on file  Financial Resource Strain: Medium Risk (11/03/2024)   Received from [facility]   Overall Financial Resource Strain (CARDIA)    Difficulty of Paying Living Expenses: Somewhat hard  Food Insecurity: No Food Insecurity (11/03/2024)   Received from [facility]   Epic    Within the past 12 months, you worried that your food would run out before you got the money to buy more.: Never true    Within the past 12 months, the food you bought just didn't last and you didn't have money to get more.: Never true  Recent Concern: Food Insecurity - Food Insecurity Present (10/04/2024)   Received from [facility]   Epic    Within the past 12 months, you worried that your food would run out before you got the money to buy more.: Sometimes true    Within the past 12 months, the food  you bought just didn't last and you didn't have money to get more.: Never true  Transportation Needs: No Transportation Needs (11/03/2024)   Received from [facility] - Transportation    In the past 12 months, has lack of transportation kept you from medical appointments or from getting medications?: No    Lack of Transportation (Non-Medical): No  Recent Concern: Transportation Needs - Unmet Transportation Needs (10/04/2024)   Received from [facility] - Transportation    In the past 12 months, has lack of transportation kept you from medical appointments or from getting medications?: Yes    Lack of Transportation (Non-Medical): No  Physical Activity: Not on file  Stress: Not on file  Social Connections: Not on file  Intimate Partner Violence: Not on file  Depression (EYV7-0): Not on file  Alcohol Screen: Not on file  Housing: Low Risk  (11/03/2024)   Received from [facility]   Epic    In the last 12 months, was there a time when you were not able to pay the mortgage or rent on time?: No    In the past 12 months, how many times have you moved where you were living?: 0    At any time in the past 12 months, were you homeless or living in a shelter (including now)?: No  Utilities: Not At Risk (11/03/2024)   Received from [facility]   Epic    In the past 12 months has the electric, gas, oil, or water  company threatened to shut off services in your home?: No  Health Literacy: Not on file    ALLERGIES:    Allergies[1]  CURRENT MEDICATIONS:    No current facility-administered medications for this encounter.   Current Outpatient Medications  Medication Sig Dispense Refill   acetaminophen  (TYLENOL ) 500 MG tablet Take 1,000 mg by mouth daily.     amLODipine  (NORVASC ) 10 MG tablet Take 10 mg by mouth daily.     atorvastatin  (LIPITOR) 10 MG tablet Take 10 mg by mouth daily.      bisoprolol -hydrochlorothiazide  (ZIAC ) 10-6.25 MG tablet TAKE 1 TABLET BY MOUTH DAILY 30 tablet 0   chlorhexidine  (HIBICLENS ) 4 % external liquid Apply 15 mLs (1 Application total) topically as directed for 30 doses. Use as directed daily for 5 days every other week for 6 weeks. 946 mL 1   COMBIGAN  0.2-0.5 % ophthalmic solution Place 1 drop into both eyes every 12 (  twelve) hours.     DiphenhydrAMINE  HCl (ALKA-SELTZER PLUS ALLERGY PO) Take 1 tablet by mouth daily as needed (allergies). (Patient not taking: Reported on 09/18/2024)     ELIQUIS  5 MG TABS tablet TAKE 1 TABLET BY MOUTH TWICE A DAY 60 tablet 5   levothyroxine  (SYNTHROID ) 25 MCG tablet Take 25 mcg by mouth daily before breakfast.     losartan  (COZAAR ) 100 MG tablet TAKE ONE (1) TABLET BY MOUTH EVERY DAY 90 tablet 3   Multiple Vitamins-Minerals (MULTI COMPLETE PO) Take 1 tablet by mouth daily.      nicotine  (NICODERM CQ  - DOSED IN MG/24 HOURS) 21 mg/24hr patch Place 1 patch (21 mg total) onto the skin daily. (Patient not taking: Reported on 09/03/2024) 28 patch 0   oxyCODONE  (OXY IR/ROXICODONE ) 5 MG immediate release tablet Take 1 tablet (5 mg total) by mouth every 4 (four) hours as needed for moderate pain (pain score 4-6) (pain score 4-6). 30 tablet 0   pantoprazole  (PROTONIX ) 20 MG tablet Take 20 mg by mouth daily.     pantoprazole  (PROTONIX ) 40 MG tablet TAKE ONE (1) TABLET EACH DAY (Patient not taking: Reported on 09/18/2024) 90 tablet 3   traMADol  (ULTRAM ) 50 MG tablet Take 1-2 tablets (50-100 mg total) by mouth every 4 (four) hours as needed for moderate pain (pain score 4-6). 30 tablet 0    REVIEW OF SYSTEMS:   [X]  denotes positive finding, [ ]  denotes negative finding Cardiac  Comments:  Chest pain or chest pressure:    Shortness of breath upon exertion:    Short of breath when lying flat:    Irregular heart rhythm:        Vascular    Pain in calf, thigh, or hip brought on by ambulation: x   Pain in feet at night that wakes  you up from your sleep:     Blood clot in your veins:    Leg swelling:         Pulmonary    Oxygen  at home:    Productive cough:     Wheezing:         Neurologic    Sudden weakness in arms or legs:     Sudden numbness in arms or legs:     Sudden onset of difficulty speaking or slurred speech:    Temporary loss of vision in one eye:     Problems with dizziness:         Gastrointestinal    Blood in stool:      Vomited blood:         Genitourinary    Burning when urinating:     Blood in urine:        Psychiatric    Major depression:         Hematologic    Bleeding problems:    Problems with blood clotting too easily:        Skin    Rashes or ulcers:        Constitutional    Fever or chills:     PHYSICAL EXAM:   Vitals:   12/30/24 1406 12/30/24 1753 12/30/24 1759  BP: (!) 150/64  (!) 166/82  Pulse: 74 67 77  Resp: 16  16  Temp: 98.6 F (37 C)  98.1 F (36.7 C)  TempSrc:   Oral  SpO2: 99% 100% 98%  Weight: 59.9 kg      GENERAL: The patient is a well-nourished female, in no acute distress. The vital  signs are documented above. CARDIAC: There is a regular rate and rhythm.  VASCULAR: Palpable femoral pulses.  Nonpalpable pedal pulses PULMONARY: Nonlabored respirations ABDOMEN: Soft and non-tender MUSCULOSKELETAL: There are no major deformities or cyanosis. NEUROLOGIC: Normal motor and sensory function to the left foot SKIN: There are no ulcers or rashes noted. PSYCHIATRIC: The patient has a normal affect.  STUDIES:   I have reviewed the following ultrasound: 1. No evidence of DVT. 2. Occlusion of the left superficial femoral arterial stent with reconstitution of the distal popliteal artery.  ASSESSMENT and PLAN   Left SFA stent occlusion: The patient developed symptoms this morning.  This is limiting her ability to walk.  She is complaining of pain.  She does not have motor or sensory deficits.  Her last dose of Eliquis  was this morning.  I discussed  that this is a limb threatening situation and she needs to address this expeditiously.  I have recommended admission to the hospitalist service with IV heparin .  She will be n.p.o. after midnight and plan for angiogram tomorrow.   Malvina New, IV, MD, FACS [facility]  Pager [phone]      [1]  Allergies Allergen Reactions   Aspirin  Other (See Comments)    GI BLEED   Nsaids Other (See Comments)    GI BLEED   Penicillins Itching, Rash and Other (See Comments)    IgE = 134 (WNL) on 09/08/2024 Has patient had a PCN reaction causing immediate rash, facial/tongue/throat swelling, SOB or lightheadedness with hypotension: Yes Has patient had a PCN reaction causing severe rash involving mucus membranes or skin necrosis: No Has patient had a PCN reaction that required hospitalization No Has patient had a PCN reaction occurring within the last 10 years: No If all of the above answers are NO, then may proceed with Cephalosporin use.    Pollen Extract Other (See Comments)    Congestion, Eye Irritation   "

## 2024-12-30 NOTE — ED Notes (Signed)
"  Blue top sent   "

## 2024-12-30 NOTE — Telephone Encounter (Signed)
 Patient left a message stating this morning she starting have left leg pain 8 of 10 and also her leg is cold. The patient stated that the pain was subsiding but now the pain level has increase. Patient stated that it feels like the last time when she had a dvt. Patient was advised on vascular standpoint that she should go to ED for further evaluation. Patient verbalized understanding to medical advice

## 2024-12-30 NOTE — H&P (View-Only) (Signed)
 "                                   [facility]  Patient name: Lindsay [name] MRN: [id number] DOB: 02-17-1941 Sex: female   REQUESTING PROVIDER:    ER   REASON FOR CONSULT:    Leg pain  HISTORY OF PRESENT ILLNESS:   Lindsay [name] is a 84 y.o. female, who is has undergone the following vascular procedures:  02/27/2018: Angioplasty left anterior tibial artery, SFA and popliteal artery, stenting to the left SFA and popliteal 10/15/2018: Thrombolysis and thrombectomy of left SFA and popliteal artery with peroneal angioplasty and Viabahn stenting 12/29/2018: Angioplasty right common iliac artery 12/23/2019: Thrombolysis and mechanical thrombectomy of left SFA popliteal and tibioperoneal trunk with angioplasty 12/24/2019: Angioplasty left popliteal and posterior tibial artery   The patient presented to the emergency department with leg pain since this morning.  She reports not taking her medicine on Monday however has been taking her Eliquis  since then.  Her last dose was this morning.  Patient is medically managed for hypertension.  She is on anticoagulation with Eliquis  she is a smoker.  She takes a statin for hypercholesterolemia.  She is medically managed for hypertension. PAST MEDICAL HISTORY    Past Medical History:  Diagnosis Date   Anemia    Anxiety    Arthritis of back    Deep vein thrombosis (DVT) of left lower extremity (HCC) 12/2019   Depression    Diverticulosis    GERD (gastroesophageal reflux disease)    Hematochezia    Hypertension    Hypothyroidism    PAD (peripheral artery disease)    Wears dentures    Full upper     FAMILY HISTORY   Family History  Problem Relation Age of Onset   Diabetes Mother    Hypertension Mother    Diabetes Sister    Hypertension Sister     SOCIAL HISTORY:   Social History   Socioeconomic History   Marital status: Widowed    Spouse name: Not on file   Number of children: Not on file    Years of education: Not on file   Highest education level: Not on file  Occupational History   Not on file  Tobacco Use   Smoking status: Light Smoker    Current packs/day: 0.00    Average packs/day: 0.5 packs/day for 30.0 years (15.0 ttl pk-yrs)    Types: Cigarettes    Start date: 10/14/1988    Last attempt to quit: 10/14/2018    Years since quitting: 6.2   Smokeless tobacco: Never  Vaping Use   Vaping status: Never Used  Substance and Sexual Activity   Alcohol use: Yes    Alcohol/week: 0.0 standard drinks of alcohol    Comment: occassionally/ whiskey on weekend; total of 4 drinks per week   Drug use: No   Sexual activity: Not Currently  Other Topics Concern   Not on file  Social History Narrative   From [place].   [employer]- 20 hours week.    Husband in nursing home in [place].    1 child; 3 step children   Lives by herself, independent         Social Drivers of Health   Tobacco Use: High Risk (12/30/2024)   Patient History    Smoking Tobacco Use: Light Smoker    Smokeless  Tobacco Use: Never    Passive Exposure: Not on file  Financial Resource Strain: Medium Risk (11/03/2024)   Received from [facility]   Overall Financial Resource Strain (CARDIA)    Difficulty of Paying Living Expenses: Somewhat hard  Food Insecurity: No Food Insecurity (11/03/2024)   Received from [facility]   Epic    Within the past 12 months, you worried that your food would run out before you got the money to buy more.: Never true    Within the past 12 months, the food you bought just didn't last and you didn't have money to get more.: Never true  Recent Concern: Food Insecurity - Food Insecurity Present (10/04/2024)   Received from [facility]   Epic    Within the past 12 months, you worried that your food would run out before you got the money to buy more.: Sometimes true    Within the past 12 months, the food  you bought just didn't last and you didn't have money to get more.: Never true  Transportation Needs: No Transportation Needs (11/03/2024)   Received from [facility] - Transportation    In the past 12 months, has lack of transportation kept you from medical appointments or from getting medications?: No    Lack of Transportation (Non-Medical): No  Recent Concern: Transportation Needs - Unmet Transportation Needs (10/04/2024)   Received from [facility] - Transportation    In the past 12 months, has lack of transportation kept you from medical appointments or from getting medications?: Yes    Lack of Transportation (Non-Medical): No  Physical Activity: Not on file  Stress: Not on file  Social Connections: Not on file  Intimate Partner Violence: Not on file  Depression (EYV7-0): Not on file  Alcohol Screen: Not on file  Housing: Low Risk  (11/03/2024)   Received from [facility]   Epic    In the last 12 months, was there a time when you were not able to pay the mortgage or rent on time?: No    In the past 12 months, how many times have you moved where you were living?: 0    At any time in the past 12 months, were you homeless or living in a shelter (including now)?: No  Utilities: Not At Risk (11/03/2024)   Received from [facility]   Epic    In the past 12 months has the electric, gas, oil, or water  company threatened to shut off services in your home?: No  Health Literacy: Not on file    ALLERGIES:    Allergies[1]  CURRENT MEDICATIONS:    No current facility-administered medications for this encounter.   Current Outpatient Medications  Medication Sig Dispense Refill   acetaminophen  (TYLENOL ) 500 MG tablet Take 1,000 mg by mouth daily.     amLODipine  (NORVASC ) 10 MG tablet Take 10 mg by mouth daily.     atorvastatin  (LIPITOR) 10 MG tablet Take 10 mg by mouth daily.      bisoprolol -hydrochlorothiazide  (ZIAC ) 10-6.25 MG tablet TAKE 1 TABLET BY MOUTH DAILY 30 tablet 0   chlorhexidine  (HIBICLENS ) 4 % external liquid Apply 15 mLs (1 Application total) topically as directed for 30 doses. Use as directed daily for 5 days every other week for 6 weeks. 946 mL 1   COMBIGAN  0.2-0.5 % ophthalmic solution Place 1 drop into both eyes every 12 (  twelve) hours.     DiphenhydrAMINE  HCl (ALKA-SELTZER PLUS ALLERGY PO) Take 1 tablet by mouth daily as needed (allergies). (Patient not taking: Reported on 09/18/2024)     ELIQUIS  5 MG TABS tablet TAKE 1 TABLET BY MOUTH TWICE A DAY 60 tablet 5   levothyroxine  (SYNTHROID ) 25 MCG tablet Take 25 mcg by mouth daily before breakfast.     losartan  (COZAAR ) 100 MG tablet TAKE ONE (1) TABLET BY MOUTH EVERY DAY 90 tablet 3   Multiple Vitamins-Minerals (MULTI COMPLETE PO) Take 1 tablet by mouth daily.      nicotine  (NICODERM CQ  - DOSED IN MG/24 HOURS) 21 mg/24hr patch Place 1 patch (21 mg total) onto the skin daily. (Patient not taking: Reported on 09/03/2024) 28 patch 0   oxyCODONE  (OXY IR/ROXICODONE ) 5 MG immediate release tablet Take 1 tablet (5 mg total) by mouth every 4 (four) hours as needed for moderate pain (pain score 4-6) (pain score 4-6). 30 tablet 0   pantoprazole  (PROTONIX ) 20 MG tablet Take 20 mg by mouth daily.     pantoprazole  (PROTONIX ) 40 MG tablet TAKE ONE (1) TABLET EACH DAY (Patient not taking: Reported on 09/18/2024) 90 tablet 3   traMADol  (ULTRAM ) 50 MG tablet Take 1-2 tablets (50-100 mg total) by mouth every 4 (four) hours as needed for moderate pain (pain score 4-6). 30 tablet 0    REVIEW OF SYSTEMS:   [X]  denotes positive finding, [ ]  denotes negative finding Cardiac  Comments:  Chest pain or chest pressure:    Shortness of breath upon exertion:    Short of breath when lying flat:    Irregular heart rhythm:        Vascular    Pain in calf, thigh, or hip brought on by ambulation: x   Pain in feet at night that wakes  you up from your sleep:     Blood clot in your veins:    Leg swelling:         Pulmonary    Oxygen  at home:    Productive cough:     Wheezing:         Neurologic    Sudden weakness in arms or legs:     Sudden numbness in arms or legs:     Sudden onset of difficulty speaking or slurred speech:    Temporary loss of vision in one eye:     Problems with dizziness:         Gastrointestinal    Blood in stool:      Vomited blood:         Genitourinary    Burning when urinating:     Blood in urine:        Psychiatric    Major depression:         Hematologic    Bleeding problems:    Problems with blood clotting too easily:        Skin    Rashes or ulcers:        Constitutional    Fever or chills:     PHYSICAL EXAM:   Vitals:   12/30/24 1406 12/30/24 1753 12/30/24 1759  BP: (!) 150/64  (!) 166/82  Pulse: 74 67 77  Resp: 16  16  Temp: 98.6 F (37 C)  98.1 F (36.7 C)  TempSrc:   Oral  SpO2: 99% 100% 98%  Weight: 59.9 kg      GENERAL: The patient is a well-nourished female, in no acute distress. The vital  signs are documented above. CARDIAC: There is a regular rate and rhythm.  VASCULAR: Palpable femoral pulses.  Nonpalpable pedal pulses PULMONARY: Nonlabored respirations ABDOMEN: Soft and non-tender MUSCULOSKELETAL: There are no major deformities or cyanosis. NEUROLOGIC: Normal motor and sensory function to the left foot SKIN: There are no ulcers or rashes noted. PSYCHIATRIC: The patient has a normal affect.  STUDIES:   I have reviewed the following ultrasound: 1. No evidence of DVT. 2. Occlusion of the left superficial femoral arterial stent with reconstitution of the distal popliteal artery.  ASSESSMENT and PLAN   Left SFA stent occlusion: The patient developed symptoms this morning.  This is limiting her ability to walk.  She is complaining of pain.  She does not have motor or sensory deficits.  Her last dose of Eliquis  was this morning.  I discussed  that this is a limb threatening situation and she needs to address this expeditiously.  I have recommended admission to the hospitalist service with IV heparin .  She will be n.p.o. after midnight and plan for angiogram tomorrow.   Lindsay [name], IV, MD, FACS [facility]  Pager [phone]      [1]  Allergies Allergen Reactions   Aspirin  Other (See Comments)    GI BLEED   Nsaids Other (See Comments)    GI BLEED   Penicillins Itching, Rash and Other (See Comments)    IgE = 134 (WNL) on 09/08/2024 Has patient had a PCN reaction causing immediate rash, facial/tongue/throat swelling, SOB or lightheadedness with hypotension: Yes Has patient had a PCN reaction causing severe rash involving mucus membranes or skin necrosis: No Has patient had a PCN reaction that required hospitalization No Has patient had a PCN reaction occurring within the last 10 years: No If all of the above answers are NO, then may proceed with Cephalosporin use.    Pollen Extract Other (See Comments)    Congestion, Eye Irritation   "

## 2024-12-30 NOTE — ED Provider Notes (Signed)
 "   [facility] Emergency Department Provider Note     Event Date/Time   First MD Initiated Contact with Patient 12/30/24 1615     (approximate)   History   Leg Pain   HPI  Lindsay [name] is a 84 y.o. female with a history of HTN, hypothyroidism, anxiety, depression, GERD, OA, PAD, and DVT on Eliquis , who presents to the ED for evaluation of left leg pain.  Patient presents to the ED endorsing left leg pain with onset this morning.  She denies any injury, trauma, or fall.  No reports any redness, swelling, or disability.  She endorses pain primarily from the calf to the ankle, but is noted at times intermittent pain from the thigh to the ankle.  She was advised by her vascular surgery group ([facility]) to report to the ED for further evaluation.  No cough, congestion, chest pain, shortness of breath.  Patient reports she is taking her Eliquis  as prescribed, and only missed her Monday dose of her maintenance medications.  Physical Exam   Triage Vital Signs: ED Triage Vitals [12/30/24 1406]  Encounter Vitals Group     BP (!) 150/64     Girls Systolic BP Percentile      Girls Diastolic BP Percentile      Boys Systolic BP Percentile      Boys Diastolic BP Percentile      Pulse Rate 74     Resp 16     Temp 98.6 F (37 C)     Temp src      SpO2 99 %     Weight 132 lb (59.9 kg)     Height      Head Circumference      Peak Flow      Pain Score 8     Pain Loc      Pain Education      Exclude from Growth Chart     Most recent vital signs: Vitals:   12/30/24 1759 12/30/24 2000  BP: (!) 166/82 (!) 148/58  Pulse: 77 66  Resp: 16   Temp: 98.1 F (36.7 C)   SpO2: 98% 99%    General Awake, no distress. NAD HEENT NCAT. PERRL. EOMI. No rhinorrhea. Mucous membranes are moist.  CV:  Good peripheral perfusion. Decreased distal cap refill BLE.  1+ DP LLE RESP:  Normal effort.  ABD:  No distention.  MSK:  LLE with no palpable cords to the calf.  Normal range of  motion.  NEURO: Cranial nerves II to XII grossly intact   ED Results / Procedures / Treatments   Labs (all labs ordered are listed, but only abnormal results are displayed) Labs Reviewed  CBC - Abnormal; Notable for the following components:      Result Value   HCT 35.6 (*)    MCHC 36.2 (*)    RDW 15.9 (*)    All other components within normal limits  BASIC METABOLIC PANEL WITH GFR - Abnormal; Notable for the following components:   Glucose, Bld 104 (*)    All other components within normal limits    EKG  RADIOLOGY  I personally viewed and evaluated these images as part of my medical decision making, as well as reviewing the written report by the radiologist.  ED Provider Interpretation: No acute DVT; occlusion of the left superficial femoral artery stent  US  Venous Img Lower  Left (DVT Study) Result Date: 12/30/2024 EXAM: ULTRASOUND DUPLEX OF THE LEFT LOWER EXTREMITY  VEINS TECHNIQUE: Duplex ultrasound using B-mode/gray scaled imaging and Doppler spectral analysis and color flow was obtained of the deep venous structures of the left lower extremity. COMPARISON: US  Left Lower Extremity 12/20/2019. CLINICAL HISTORY: Pain. FINDINGS: The common femoral vein, femoral vein, popliteal vein, and posterior tibial vein demonstrate normal compressibility with normal color flow and spectral analysis. ARTERIAL FINDINGS: Incidental note is made of occlusion of the left superficial femoral arterial stent. Reconstitution of the distal popliteal artery is noted. IMPRESSION: 1. No evidence of DVT. 2. Occlusion of the left superficial femoral arterial stent with reconstitution of the distal popliteal artery. Electronically signed by: Oneil Devonshire MD 12/30/2024 06:16 PM EST RP Workstation: [id number]    PROCEDURES:  Critical Care performed: No  Procedures   MEDICATIONS ORDERED IN ED: Medications - No data to display   IMPRESSION / MDM / ASSESSMENT AND PLAN / ED COURSE  I reviewed the triage  vital signs and the nursing notes.                              Differential diagnosis includes, but is not limited to, myalgia, contusion, peripheral edema, DVT, tendinitiS  Patient's presentation is most consistent with acute complicated illness / injury requiring diagnostic workup.  Patient's diagnosis is consistent with acute left leg pain secondary to peripheral artery stent occlusion.  Patient presents to the ED with acute LLE pain primarily from the calf to the ankle.  Exam is largely reassuring.  No palpable cords, leg edema, erythema, warmth, or ecchymosis.  Venous ultrasound, confirms no DVT but notes SFA stent occlusion.  Patient will be admitted to the hospital service for vascular intervention tomorrow schedule.  Vascular has been consulted on the case and is agreeable to the plan.   Clinical Course as of 12/30/24 2050  Wed Dec 30, 2024  1831 S/W Dr. Serene (Vasc): He will evaluate the patient in the ED.  He likely will recommend admission to the hospital service for surgical intervention by vascular.  [JM]    Clinical Course User Index [JM] Janella Rogala, Candida LULLA Kings, PA-C    FINAL CLINICAL IMPRESSION(S) / ED DIAGNOSES   Final diagnoses:  Left leg pain  Occlusion of stent of peripheral artery, initial encounter     Rx / DC Orders   ED Discharge Orders     None        Note:  This document was prepared using Dragon voice recognition software and may include unintentional dictation errors.    Loyd Candida LULLA Kings, PA-C 12/30/24 2050    Waymond Lorelle Cummins, MD 01/02/25 1515  "

## 2024-12-30 NOTE — ED Notes (Signed)
 Provided pt box lunch and ice water 

## 2024-12-31 ENCOUNTER — Encounter
Admission: EM | Disposition: A | Discharge status: Home / Self Care | Payer: Self-pay | Source: Home / Self Care | Attending: Internal Medicine

## 2024-12-31 DIAGNOSIS — E785 Hyperlipidemia, unspecified: Secondary | ICD-10-CM | POA: Diagnosis not present

## 2024-12-31 DIAGNOSIS — M79605 Pain in left leg: Secondary | ICD-10-CM | POA: Diagnosis not present

## 2024-12-31 DIAGNOSIS — I825Y2 Chronic embolism and thrombosis of unspecified deep veins of left proximal lower extremity: Secondary | ICD-10-CM

## 2024-12-31 DIAGNOSIS — E039 Hypothyroidism, unspecified: Secondary | ICD-10-CM

## 2024-12-31 DIAGNOSIS — I739 Peripheral vascular disease, unspecified: Secondary | ICD-10-CM | POA: Diagnosis not present

## 2024-12-31 DIAGNOSIS — I10 Essential (primary) hypertension: Secondary | ICD-10-CM

## 2024-12-31 DIAGNOSIS — K219 Gastro-esophageal reflux disease without esophagitis: Secondary | ICD-10-CM

## 2024-12-31 DIAGNOSIS — E876 Hypokalemia: Secondary | ICD-10-CM | POA: Diagnosis not present

## 2024-12-31 DIAGNOSIS — T82599A Other mechanical complication of unspecified cardiac and vascular devices and implants, initial encounter: Secondary | ICD-10-CM | POA: Diagnosis not present

## 2024-12-31 LAB — BASIC METABOLIC PANEL WITH GFR
Anion gap: 12 (ref 5–15)
BUN: 9 mg/dL (ref 8–23)
CO2: 25 mmol/L (ref 22–32)
Calcium: 8.6 mg/dL — ABNORMAL LOW (ref 8.9–10.3)
Chloride: 105 mmol/L (ref 98–111)
Creatinine, Ser: 0.52 mg/dL (ref 0.44–1.00)
GFR, Estimated: >60 mL/min
Glucose, Bld: 112 mg/dL — ABNORMAL HIGH (ref 70–99)
Potassium: 3.1 mmol/L — ABNORMAL LOW (ref 3.5–5.1)
Sodium: 143 mmol/L (ref 135–145)

## 2024-12-31 LAB — CBC
HCT: 31.9 % — ABNORMAL LOW (ref 36.0–46.0)
Hemoglobin: 11.5 g/dL — ABNORMAL LOW (ref 12.0–15.0)
MCH: 28.9 pg (ref 26.0–34.0)
MCHC: 36.1 g/dL — ABNORMAL HIGH (ref 30.0–36.0)
MCV: 80.2 fL (ref 80.0–100.0)
Platelets: 212 10*3/uL (ref 150–400)
RBC: 3.98 MIL/uL (ref 3.87–5.11)
RDW: 15.6 % — ABNORMAL HIGH (ref 11.5–15.5)
WBC: 6.8 10*3/uL (ref 4.0–10.5)
nRBC: 0 % (ref 0.0–0.2)

## 2024-12-31 LAB — APTT: aPTT: 62 s — ABNORMAL HIGH (ref 24–36)

## 2024-12-31 LAB — MAGNESIUM: Magnesium: 1.7 mg/dL (ref 1.7–2.4)

## 2024-12-31 LAB — HEPARIN LEVEL (UNFRACTIONATED): Heparin Unfractionated: 0.66 [IU]/mL (ref 0.30–0.70)

## 2024-12-31 MED ORDER — HEPARIN (PORCINE) 25000 UT/250ML-% IV SOLN
1200.0000 [IU]/h | INTRAVENOUS | Status: DC
  Administered 2024-12-31: 1000 [IU]/h via INTRAVENOUS
  Filled 2024-12-31: qty 250

## 2024-12-31 MED ORDER — TIROFIBAN HCL IN NACL 5-0.9 MG/100ML-% IV SOLN
0.0750 ug/kg/min | INTRAVENOUS | Status: AC
  Administered 2024-12-31 – 2025-01-01 (×2): 0.075 ug/kg/min via INTRAVENOUS
  Filled 2024-12-31: qty 100

## 2024-12-31 MED ORDER — HEPARIN BOLUS VIA INFUSION
900.0000 [IU] | Freq: Once | INTRAVENOUS | Status: AC
  Administered 2024-12-31: 900 [IU] via INTRAVENOUS
  Filled 2024-12-31: qty 900

## 2024-12-31 MED ORDER — HEPARIN (PORCINE) IN NACL 1000-0.9 UT/500ML-% IV SOLN
INTRAVENOUS | Status: DC | PRN
  Administered 2024-12-31: 1000 mL

## 2024-12-31 MED ORDER — NITROGLYCERIN 1 MG/10 ML FOR IR/CATH LAB
INTRA_ARTERIAL | Status: AC
  Filled 2024-12-31: qty 10

## 2024-12-31 MED ORDER — FENTANYL CITRATE (PF) 50 MCG/ML IJ SOSY
PREFILLED_SYRINGE | INTRAMUSCULAR | Status: AC
  Filled 2024-12-31: qty 1

## 2024-12-31 MED ORDER — FENTANYL CITRATE (PF) 100 MCG/2ML IJ SOLN
INTRAMUSCULAR | Status: DC | PRN
  Administered 2024-12-31: 25 ug via INTRAVENOUS
  Administered 2024-12-31: 50 ug via INTRAVENOUS
  Administered 2024-12-31: 25 ug via INTRAVENOUS

## 2024-12-31 MED ORDER — HYDROCHLOROTHIAZIDE 12.5 MG PO TABS
6.2500 mg | ORAL_TABLET | Freq: Every day | ORAL | Status: DC
  Administered 2025-01-01: 6.25 mg via ORAL
  Filled 2024-12-31: qty 1

## 2024-12-31 MED ORDER — NITROGLYCERIN 1 MG/10 ML FOR IR/CATH LAB
INTRA_ARTERIAL | Status: DC | PRN
  Administered 2024-12-31: 200 ug via INTRA_ARTERIAL
  Administered 2024-12-31: 400 ug via INTRA_ARTERIAL

## 2024-12-31 MED ORDER — HEPARIN SODIUM (PORCINE) 1000 UNIT/ML IJ SOLN
INTRAMUSCULAR | Status: DC | PRN
  Administered 2024-12-31: 5000 [IU] via INTRAVENOUS

## 2024-12-31 MED ORDER — TIMOLOL MALEATE 0.5 % OP SOLN
1.0000 [drp] | Freq: Two times a day (BID) | OPHTHALMIC | Status: DC
  Administered 2025-01-01: 1 [drp] via OPHTHALMIC
  Filled 2024-12-31 (×2): qty 5

## 2024-12-31 MED ORDER — TIROFIBAN HCL IN NACL 5-0.9 MG/100ML-% IV SOLN
INTRAVENOUS | Status: AC
  Filled 2024-12-31: qty 100

## 2024-12-31 MED ORDER — MIDAZOLAM HCL (PF) 2 MG/2ML IJ SOLN
INTRAMUSCULAR | Status: DC | PRN
  Administered 2024-12-31: 2 mg via INTRAVENOUS
  Administered 2024-12-31 (×2): 1 mg via INTRAVENOUS

## 2024-12-31 MED ORDER — CEFAZOLIN SODIUM-DEXTROSE 2-4 GM/100ML-% IV SOLN
INTRAVENOUS | Status: AC
  Filled 2024-12-31: qty 100

## 2024-12-31 MED ORDER — MAGNESIUM SULFATE 2 GM/50ML IV SOLN
2.0000 g | Freq: Once | INTRAVENOUS | Status: AC
  Administered 2024-12-31: 2 g via INTRAVENOUS
  Filled 2024-12-31: qty 50

## 2024-12-31 MED ORDER — LIDOCAINE-EPINEPHRINE (PF) 1 %-1:200000 IJ SOLN
INTRAMUSCULAR | Status: DC | PRN
  Administered 2024-12-31: 10 mL

## 2024-12-31 MED ORDER — CEFAZOLIN SODIUM-DEXTROSE 2-4 GM/100ML-% IV SOLN
2.0000 g | Freq: Once | INTRAVENOUS | Status: AC
  Administered 2024-12-31: 2 g via INTRAVENOUS

## 2024-12-31 MED ORDER — ATORVASTATIN CALCIUM 20 MG PO TABS
40.0000 mg | ORAL_TABLET | Freq: Every day | ORAL | Status: DC
  Administered 2025-01-01: 40 mg via ORAL
  Filled 2024-12-31: qty 2

## 2024-12-31 MED ORDER — MIDAZOLAM HCL 2 MG/2ML IJ SOLN
INTRAMUSCULAR | Status: AC
  Filled 2024-12-31: qty 2

## 2024-12-31 MED ORDER — POTASSIUM CHLORIDE 10 MEQ/100ML IV SOLN
10.0000 meq | INTRAVENOUS | Status: AC
  Administered 2024-12-31 (×3): 10 meq via INTRAVENOUS
  Filled 2024-12-31: qty 100

## 2024-12-31 MED ORDER — HEPARIN SODIUM (PORCINE) 1000 UNIT/ML IJ SOLN
INTRAMUSCULAR | Status: AC
  Filled 2024-12-31: qty 10

## 2024-12-31 MED ORDER — IODIXANOL 320 MG/ML IV SOLN
INTRAVENOUS | Status: DC | PRN
  Administered 2024-12-31: 60 mL

## 2024-12-31 MED ORDER — BISOPROLOL FUMARATE 5 MG PO TABS
10.0000 mg | ORAL_TABLET | Freq: Every day | ORAL | Status: DC
  Administered 2025-01-01: 10 mg via ORAL
  Filled 2024-12-31 (×2): qty 2

## 2024-12-31 MED ORDER — BRIMONIDINE TARTRATE 0.2 % OP SOLN
1.0000 [drp] | Freq: Two times a day (BID) | OPHTHALMIC | Status: DC
  Administered 2024-12-31 – 2025-01-01 (×2): 1 [drp] via OPHTHALMIC
  Filled 2024-12-31 (×2): qty 5

## 2024-12-31 NOTE — Progress Notes (Signed)
 PHARMACY NOTE:  RENAL DOSAGE ADJUSTMENT   Current  dosage:  Tirofiban  0.15 mcg/kg/min    Renal Function: CrCl <60 ml/min  Estimated Creatinine Clearance: 47.6 mL/min (by C-G formula based on SCr of 0.52 mg/dL).     Dosage has been changed to:  Tirofiban  0.075 mcg/kg/min IV for 18 hours  Thank you for allowing pharmacy to be a part of this patient's care.  Estill CHRISTELLA Lutes, PharmD, BCPS Clinical Pharmacist 12/31/2024 4:38 PM

## 2024-12-31 NOTE — Progress Notes (Signed)
" °  Progress Note   Patient: Lindsay [name] FMW:[id number] DOB: 09-Dec-1940 DOA: 12/30/2024     1 DOS: the patient was seen and examined on 12/31/2024   Brief hospital course: Partly taken from H&P.  Lindsay [name] is a 84 y.o. female with medical history significant of PAD s/p L SFA stent, DVT on Eliquis , hypertension, hyperlipidemia, current smoker, Presents to the emergency department for evaluation of intermittent, nonradiating left calf pain she noted upon awakening this morning.  She also notes the leg is cool.  She missed her DOAC on Monday.  On presentation hemodynamically stable.  No evidence of critical limb ischemia on exam, bilateral lower extremity Doppler with concern of SFA stent occlusion.  Vascular surgery was consulted and patient was started on heparin  infusion with a plan for angiography tomorrow.  1/29: Vital stable, mild hypokalemia with potassium at 3.1 , magnesium  1.7, which is being repleted. Patient is going for angiography and intervention for SFA stent occlusion.  Statin dose was increased  Assessment and Plan: * PAD (peripheral artery disease) Left SFA stent occlusion. Concern of being noncompliant with Eliquis  as she was missing some doses.  Vascular surgery is on board and patient will be going for angiography and reopening of stent today. -Continue with heparin  infusion today -Continue with supportive care -Increasing the dose of statin -Follow-up postprocedure recommendations  Deep vein thrombosis (DVT) of left lower extremity (HCC) Patient with history of DVT. - Home Eliquis  is being held -Currently on heparin  infusion  Hypokalemia Mild hypokalemia with potassium at 3.1 and borderline magnesium  at 1.7. - Replace potassium and magnesium  -Continue to monitor  Essential hypertension - Continue home amlodipine  and losartan   Dyslipidemia Home atorvastatin  dose was increased to 40 mg daily  GERD (gastroesophageal reflux disease) - Continue with  PPI  Hypothyroidism - Continue with home Synthroid    Subjective: Patient continued to have left leg pain when seen today.  Awaiting procedure.  Physical Exam: Vitals:   12/31/24 1630 12/31/24 1645 12/31/24 1700 12/31/24 1715  BP: (!) 170/80 (!) 140/84 (!) 152/77 (!) 156/73  Pulse: 72 65 73 70  Resp: 14 13 15 15   Temp: 98.3 F (36.8 C)     TempSrc: Oral     SpO2:  98% 94% 94%  Weight:      Height:       General.  Frail elderly lady, in no acute distress. Pulmonary.  Lungs clear bilaterally, normal respiratory effort. CV.  Regular rate and rhythm, no JVD, rub or murmur. Abdomen.  Soft, nontender, nondistended, BS positive. CNS.  Alert and oriented .  No focal neurologic deficit. Extremities.  No edema,  pulses intact and symmetrical. Psychiatry.  Judgment and insight appears normal.   Data Reviewed: Prior data reviewed  Family Communication:   Disposition: Status is: Inpatient Remains inpatient appropriate because: Severity of illness  Planned Discharge Destination: Home  DVT prophylaxis.  Heparin  infusion Time spent: 50 minutes  This record has been created using Conservation officer, historic buildings. Errors have been sought and corrected,but may not always be located. Such creation errors do not reflect on the standard of care.   Author: Amaryllis Dare, MD 12/31/2024 5:30 PM  For on call review [email or web address].  "

## 2024-12-31 NOTE — Interval H&P Note (Signed)
 History and Physical Interval Note:  12/31/2024 3:10 PM  Lindsay [name]  has presented today for surgery, with the diagnosis of pad.  The various methods of treatment have been discussed with the patient and family. After consideration of risks, benefits and other options for treatment, the patient has consented to  Procedures: ABDOMINAL AORTOGRAM W/LOWER EXTREMITY (N/A) as a surgical intervention.  The patient's history has been reviewed, patient examined, no change in status, stable for surgery.  I have reviewed the patient's chart and labs.  Questions were answered to the patient's satisfaction.     [name]

## 2024-12-31 NOTE — Assessment & Plan Note (Signed)
-

## 2024-12-31 NOTE — Op Note (Addendum)
 [facility]  Percutaneous Study/Intervention Procedural Note   Date of Surgery: 12/31/2024  Surgeon(s):Shaquisha Wynn    Assistants:none  Pre-operative Diagnosis: PAD with rest pain left lower extremity with acute on chronic ischemia and noninvasive studies suggesting thrombosis of previous SFA stents  Post-operative diagnosis:  Same  Procedure(s) Performed:             1.  Ultrasound guidance for vascular access right femoral artery             2.  Catheter placement into left common femoral artery from right femoral approach             3.  Aortogram and selective left lower extremity angiogram             4.  Mechanical thrombectomy of the left SFA and popliteal arteries with the Rota Rex device             5.  Stent placement x 2 to the left SFA and popliteal artery with a 6 mm diameter by 25 cm length and 6 mm diameter by 10 cm length Viabahn stents  6.  Angioplasty of the tibioperoneal trunk and proximal posterior tibial artery with 3 mm diameter by 15 cm length angioplasty balloon  7.  Angioplasty of the infra malleolar portion of the left posterior tibial artery for occlusion in the foot and ankle with 2 mm diameter by 15 cm length angioplasty balloon             8.  StarClose closure device right femoral artery  EBL: 50 cc  Contrast: 60 cc  Fluoro Time: 9.1 minutes  Moderate Conscious Sedation Time: approximately 42 minutes using 4 mg of Versed  and 100 mcg of Fentanyl               Indications:  Patient is a 84 y.o.female with acute on chronic ischemia of the left lower extremity with rest pain.  Her most recent intervention was about 5 years ago.. The patient has noninvasive study showing thrombosis of her SFA stents. The patient is brought in for angiography for further evaluation and potential treatment.  Due to the limb threatening nature of the situation, angiogram was performed for attempted limb salvage. The patient is aware that if the procedure  fails, amputation would be expected.  The patient also understands that even with successful revascularization, amputation may still be required due to the severity of the situation.  Risks and benefits are discussed and informed consent is obtained.   Procedure:  The patient was identified and appropriate procedural time out was performed.  The patient was then placed supine on the table and prepped and draped in the usual sterile fashion. Moderate conscious sedation was administered during a face to face encounter with the patient throughout the procedure with my supervision of the RN administering medicines and monitoring the patient's vital signs, pulse oximetry, telemetry and mental status throughout from the start of the procedure until the patient was taken to the recovery room. Ultrasound was used to evaluate the right common femoral artery.  It was patent .  A digital ultrasound image was acquired.  A Seldinger needle was used to access the right common femoral artery under direct ultrasound guidance and a permanent image was performed.  A 0.035 J wire was advanced without resistance and a 5Fr sheath was placed.  Pigtail catheter was placed into the aorta and an AP aortogram was performed. This demonstrated normal renal arteries and normal aorta  and iliac segments without significant stenosis although image quality was poor due to patient motion. I then crossed the aortic bifurcation and advanced to the left femoral head. Selective left lower extremity angiogram was then performed. This demonstrated thrombotic occlusion of the left SFA just above the previously placed stent.  The previous SFA and pop stents were thrombosed.  Initial imaging did not show any obvious runoff distally but there was significant patient motion. It was felt that it was in the patient's best interest to proceed with intervention after these images to avoid a second procedure and a larger amount of contrast and fluoroscopy based  off of the findings from the initial angiogram. The patient was systemically heparinized and a 6 French Ansell sheath was then placed over the [employer] wire. I then used a Kumpe catheter and the advantage wire to easily cross the thrombotic occlusion of the left SFA and popliteal arteries and get a catheter down into the below-knee popliteal artery.  The only runoff was the posterior tibial artery.  The tibioperoneal trunk had about a 70% stenosis.  The distal posterior tibial artery at the ankle occluded with reconstitution in the midfoot.  This was clearly a critical and limb threatening situation.  I then parked a V18 wire distally and started with mechanical thrombectomy in the left SFA and popliteal arteries.  Thrombectomy was performed with the Rota Rex catheter with 2 passes in the left SFA and popliteal arteries.  This resulted in a significant debulking of the thrombus although there remained thrombus and a near occlusive stenosis at the proximal edge of the stents in the SFA and thrombus and what appeared to be still an occlusion in the popliteal artery just distal to the previously placed stents.  I elected to cover these areas with long covered stents.  A 6 mm diameter by 25 cm length and a 6 mm diameter by 10 cm length Viabahn stent was selected and deployed from the mid to distal popliteal artery up to the proximal SFA and postdilated with a 5 mm balloon with excellent angiograph completion result.  I then turned my attention to the tibial disease.  The tibioperoneal trunk and the most proximal portion of the posterior tibial artery were addressed for the stenosis/simple lesion with a 3 mm diameter by 15 cm length angioplasty balloon inflated to 8 atm for 1 minute.  The complex infra malleolar lesion in the left posterior tibial artery was treated with a 2 mm diameter by 15 cm length angioplasty balloon inflated to 10 atm for 1 minute.  Completion imaging showed inline flow with less than 20%  residual stenosis in both the tibioperoneal trunk and the infra malleolar posterior tibial artery with brisk flow into the foot. I elected to terminate the procedure. The sheath was removed and StarClose closure device was deployed in the right femoral artery with excellent hemostatic result. The patient was taken to the recovery room in stable condition having tolerated the procedure well.  Findings:               Aortogram:  This demonstrated normal renal arteries and normal aorta and iliac segments without significant stenosis although image quality was poor due to patient motion.             Left Lower Extremity:  This demonstrated thrombotic occlusion of the left SFA just above the previously placed stent.  The previous SFA and pop stents were thrombosed.  Initial imaging did not show any obvious runoff  distally but there was significant patient motion   Disposition: Patient was taken to the recovery room in stable condition having tolerated the procedure well.  Complications: None  Selinda Gu 12/31/2024 4:29 PM   This note was created with Dragon Medical transcription system. Any errors in dictation are purely unintentional.

## 2024-12-31 NOTE — Progress Notes (Signed)
 PHARMACY - ANTICOAGULATION CONSULT NOTE  Pharmacy Consult for Heparin   Indication: DVT  Allergies[1]  Patient Measurements: Height: 5' 4 (162.6 cm) Weight: 60.4 kg (133 lb 2.5 oz) IBW/kg (Calculated) : 54.7 HEPARIN  DW (KG): 60.4  Vital Signs: Temp: 98.1 F (36.7 C) (01/29 0850) BP: 109/67 (01/29 0850) Pulse Rate: 63 (01/29 0850)  Labs: Recent Labs    12/30/24 1409 12/31/24 0545 12/31/24 1132  HGB 12.9 11.5*  --   HCT 35.6* 31.9*  --   PLT 237 212  --   APTT  --   --  62*  HEPARINUNFRC  --   --  0.66  CREATININE 0.77 0.52  --     Estimated Creatinine Clearance: 47.6 mL/min (by C-G formula based on SCr of 0.52 mg/dL).   Medical History: Past Medical History:  Diagnosis Date   Anemia    Anxiety    Arthritis of back    Deep vein thrombosis (DVT) of left lower extremity (HCC) 12/2019   Depression    Diverticulosis    GERD (gastroesophageal reflux disease)    Hematochezia    Hypertension    Hypothyroidism    PAD (peripheral artery disease)    Wears dentures    Full upper   Assessment: Pharmacy consulted to dose heparin  in this 84 year old female admitted with PAD, SFA stent occlusion.  PMH includes PAD s/p L SFA stent, DVT on Eliquis , hypertension, hyperlipidemia, current smoker,  Pt was on Eliquis  5 mg PO BID PTA, last dose on 1/28 AM   CrCl = 47.6 ml/min   *No baseline labs done*  Date Time Results Comments 1/29 1132 aPTT=62 HL=0.66, rate 1000 units/hr  Goal of Therapy:  Heparin  level 0.3-0.7 units/ml aPTT 66 - 102 seconds Monitor platelets by anticoagulation protocol: Yes   Plan:  Give heparin  900 units IV bolus now, and change heparin  rate to 1200 units. Will use aPTT to guide dosing until correlating with HL  Draw aPTT  8 hrs after rate change CBC daily   Dodger Sinning Rodriguez-Guzman PharmD, BCPS 12/31/2024 1:25 PM       [1]  Allergies Allergen Reactions   Aspirin  Other (See Comments)    GI BLEED   Nsaids Other (See Comments)    GI  BLEED   Penicillins Itching, Rash and Other (See Comments)    IgE = 134 (WNL) on 09/08/2024 Has patient had a PCN reaction causing immediate rash, facial/tongue/throat swelling, SOB or lightheadedness with hypotension: Yes Has patient had a PCN reaction causing severe rash involving mucus membranes or skin necrosis: No Has patient had a PCN reaction that required hospitalization No Has patient had a PCN reaction occurring within the last 10 years: No If all of the above answers are NO, then may proceed with Cephalosporin use.    Pollen Extract Other (See Comments)    Congestion, Eye Irritation

## 2024-12-31 NOTE — Progress Notes (Addendum)
 PHARMACY - ANTICOAGULATION CONSULT NOTE  Pharmacy Consult for Heparin   Indication: DVT  Allergies[1]  Patient Measurements: Height: 5' 4 (162.6 cm) Weight: 60.4 kg (133 lb 2.5 oz) IBW/kg (Calculated) : 54.7 HEPARIN  DW (KG): 60.4  Vital Signs: Temp: 97.7 F (36.5 C) (01/28 2318) Temp Source: Oral (01/28 2318) BP: 161/78 (01/28 2318) Pulse Rate: 80 (01/28 2318)  Labs: Recent Labs    12/30/24 1409  HGB 12.9  HCT 35.6*  PLT 237  CREATININE 0.77    Estimated Creatinine Clearance: 47.6 mL/min (by C-G formula based on SCr of 0.77 mg/dL).   Medical History: Past Medical History:  Diagnosis Date   Anemia    Anxiety    Arthritis of back    Deep vein thrombosis (DVT) of left lower extremity (HCC) 12/2019   Depression    Diverticulosis    GERD (gastroesophageal reflux disease)    Hematochezia    Hypertension    Hypothyroidism    PAD (peripheral artery disease)    Wears dentures    Full upper    Medications:  Medications Prior to Admission  Medication Sig Dispense Refill Last Dose/Taking   acetaminophen  (TYLENOL ) 500 MG tablet Take 1,000 mg by mouth daily.   12/30/2024   amLODipine  (NORVASC ) 10 MG tablet Take 10 mg by mouth daily.   12/30/2024   atorvastatin  (LIPITOR) 10 MG tablet Take 10 mg by mouth daily.   12/30/2024   bisoprolol -hydrochlorothiazide  (ZIAC ) 10-6.25 MG tablet TAKE 1 TABLET BY MOUTH DAILY 30 tablet 0 12/30/2024   chlorhexidine  (HIBICLENS ) 4 % external liquid Apply 15 mLs (1 Application total) topically as directed for 30 doses. Use as directed daily for 5 days every other week for 6 weeks. 946 mL 1 12/30/2024   COMBIGAN  0.2-0.5 % ophthalmic solution Place 1 drop into both eyes every 12 (twelve) hours.   12/30/2024   ELIQUIS  5 MG TABS tablet TAKE 1 TABLET BY MOUTH TWICE A DAY 60 tablet 5 12/30/2024   levothyroxine  (SYNTHROID ) 25 MCG tablet Take 25 mcg by mouth daily before breakfast.   12/30/2024   losartan  (COZAAR ) 100 MG tablet TAKE ONE (1) TABLET BY MOUTH  EVERY DAY 90 tablet 3 12/30/2024   Multiple Vitamins-Minerals (MULTI COMPLETE PO) Take 1 tablet by mouth daily.    12/30/2024   pantoprazole  (PROTONIX ) 40 MG tablet TAKE ONE (1) TABLET EACH DAY 90 tablet 3 12/30/2024   nicotine  (NICODERM CQ  - DOSED IN MG/24 HOURS) 21 mg/24hr patch Place 1 patch (21 mg total) onto the skin daily. (Patient not taking: Reported on 09/03/2024) 28 patch 0 Not Taking   oxyCODONE  (OXY IR/ROXICODONE ) 5 MG immediate release tablet Take 1 tablet (5 mg total) by mouth every 4 (four) hours as needed for moderate pain (pain score 4-6) (pain score 4-6). (Patient not taking: Reported on 12/30/2024) 30 tablet 0 Not Taking   potassium chloride  SA (KLOR-CON  M) 20 MEQ tablet Take 20 mEq by mouth daily. (Patient not taking: Reported on 12/30/2024)   Not Taking   traMADol  (ULTRAM ) 50 MG tablet Take 1-2 tablets (50-100 mg total) by mouth every 4 (four) hours as needed for moderate pain (pain score 4-6). (Patient not taking: Reported on 12/30/2024) 30 tablet 0 Not Taking    Assessment: Pharmacy consulted to dose heparin  in this 84 year old female admitted with PAD, SFA stent occlusion.  Pt was on Eliquis  5 mg PO BID PTA, last dose on 1/28 AM   CrCl = 47.6 ml/min   Goal of Therapy:  Heparin  level  0.3-0.7 units/ml aPTT 66 - 102 seconds Monitor platelets by anticoagulation protocol: Yes   Plan:  - unable to draw baseline anticoag labs, pt is hard stick  - Will order heparin  1000 units/hr, no bolus b/c of recent Eliquis  - use aPTT to guide dosing until correlating with HL  - draw aPTT and HL 8 hrs after start of drip - CBC daily   Brandilynn Taormina D 12/31/2024,2:31 AM      [1]  Allergies Allergen Reactions   Aspirin  Other (See Comments)    GI BLEED   Nsaids Other (See Comments)    GI BLEED   Penicillins Itching, Rash and Other (See Comments)    IgE = 134 (WNL) on 09/08/2024 Has patient had a PCN reaction causing immediate rash, facial/tongue/throat swelling, SOB or  lightheadedness with hypotension: Yes Has patient had a PCN reaction causing severe rash involving mucus membranes or skin necrosis: No Has patient had a PCN reaction that required hospitalization No Has patient had a PCN reaction occurring within the last 10 years: No If all of the above answers are NO, then may proceed with Cephalosporin use.    Pollen Extract Other (See Comments)    Congestion, Eye Irritation

## 2024-12-31 NOTE — Assessment & Plan Note (Signed)
 Mild hypokalemia with potassium at 3.1 and borderline magnesium  at 1.7. - Replace potassium and magnesium  -Continue to monitor

## 2024-12-31 NOTE — Assessment & Plan Note (Signed)
 Home atorvastatin  dose was increased to 40 mg daily

## 2024-12-31 NOTE — Assessment & Plan Note (Signed)
 Patient with history of DVT. - Home Eliquis  is being held -Currently on heparin  infusion

## 2024-12-31 NOTE — Progress Notes (Signed)
 Patient admitted to PCU from specials. Upon arrival, patient placed on continuous cardiac telemetry monitoring. Vital signs obtained and documented. Patient oriented to room, call bell system, and unit routines. Safety measures implemented: bed in lowest position, call bell in reach, and side rails up x3. Patient repositioned for comfort and skin protection. Patient resting comfortably in bed, no acute distress noted at this time.

## 2024-12-31 NOTE — Assessment & Plan Note (Signed)
 Continue with PPI

## 2024-12-31 NOTE — Hospital Course (Addendum)
 Partly taken from H&P.  Lindsay [name] is a 84 y.o. female with medical history significant of PAD s/p L SFA stent, DVT on Eliquis , hypertension, hyperlipidemia, current smoker, Presents to the emergency department for evaluation of intermittent, nonradiating left calf pain she noted upon awakening this morning.  She also notes the leg is cool.  She missed her DOAC on Monday.  On presentation hemodynamically stable.  No evidence of critical limb ischemia on exam, bilateral lower extremity Doppler with concern of SFA stent occlusion.  Vascular surgery was consulted and patient was started on heparin  infusion with a plan for angiography tomorrow.  1/29: Vital stable, mild hypokalemia with potassium at 3.1 , magnesium  1.7, which is being repleted. Patient is going for angiography and intervention for SFA stent occlusion.  Statin dose was increased.  1/30: Hemodynamically stable, s/p mechanical thrombectomy, angiography with angioplasty and new stents placement by vascular surgery on 12/31/2024.  Tolerated the procedure well.  Pain resolved.  Vascular surgery is advising to start Plavix  along with Eliquis , aspirin  allergy noted.  Her home atorvastatin  dose was increased to 40 mg daily.  Patient was advised to regularly take her home medications and need to have a close follow-up with her providers including vascular surgery.

## 2024-12-31 NOTE — Plan of Care (Signed)

## 2024-12-31 NOTE — Assessment & Plan Note (Signed)
-  Continue with home Synthroid 

## 2024-12-31 NOTE — Assessment & Plan Note (Signed)
 Left SFA stent occlusion. Concern of being noncompliant with Eliquis  as she was missing some doses.  Vascular surgery is on board and patient will be going for angiography and reopening of stent today. -Continue with heparin  infusion today -Continue with supportive care -Increasing the dose of statin -Follow-up postprocedure recommendations

## 2025-01-01 ENCOUNTER — Encounter: Payer: Self-pay | Admitting: Vascular Surgery

## 2025-01-01 ENCOUNTER — Other Ambulatory Visit: Payer: Self-pay

## 2025-01-01 DIAGNOSIS — I739 Peripheral vascular disease, unspecified: Secondary | ICD-10-CM | POA: Diagnosis not present

## 2025-01-01 DIAGNOSIS — I825Y2 Chronic embolism and thrombosis of unspecified deep veins of left proximal lower extremity: Secondary | ICD-10-CM | POA: Diagnosis not present

## 2025-01-01 DIAGNOSIS — T82599A Other mechanical complication of unspecified cardiac and vascular devices and implants, initial encounter: Secondary | ICD-10-CM | POA: Diagnosis not present

## 2025-01-01 DIAGNOSIS — M79605 Pain in left leg: Principal | ICD-10-CM

## 2025-01-01 LAB — BASIC METABOLIC PANEL WITH GFR
Anion gap: 10 (ref 5–15)
BUN: 8 mg/dL (ref 8–23)
CO2: 27 mmol/L (ref 22–32)
Calcium: 8.7 mg/dL — ABNORMAL LOW (ref 8.9–10.3)
Chloride: 105 mmol/L (ref 98–111)
Creatinine, Ser: 0.53 mg/dL (ref 0.44–1.00)
GFR, Estimated: >60 mL/min
Glucose, Bld: 119 mg/dL — ABNORMAL HIGH (ref 70–99)
Potassium: 3.5 mmol/L (ref 3.5–5.1)
Sodium: 141 mmol/L (ref 135–145)

## 2025-01-01 LAB — CBC
HCT: 31.5 % — ABNORMAL LOW (ref 36.0–46.0)
Hemoglobin: 11.4 g/dL — ABNORMAL LOW (ref 12.0–15.0)
MCH: 29.2 pg (ref 26.0–34.0)
MCHC: 36.2 g/dL — ABNORMAL HIGH (ref 30.0–36.0)
MCV: 80.8 fL (ref 80.0–100.0)
Platelets: 203 10*3/uL (ref 150–400)
RBC: 3.9 MIL/uL (ref 3.87–5.11)
RDW: 15.5 % (ref 11.5–15.5)
WBC: 8.4 10*3/uL (ref 4.0–10.5)
nRBC: 0 % (ref 0.0–0.2)

## 2025-01-01 MED ORDER — CLOPIDOGREL BISULFATE 75 MG PO TABS
75.0000 mg | ORAL_TABLET | Freq: Every day | ORAL | 1 refills | Status: AC
  Filled 2025-01-01: qty 30, 30d supply, fill #0

## 2025-01-01 MED ORDER — CLOPIDOGREL BISULFATE 75 MG PO TABS
75.0000 mg | ORAL_TABLET | Freq: Every day | ORAL | Status: DC
  Administered 2025-01-01: 75 mg via ORAL
  Filled 2025-01-01: qty 1

## 2025-01-01 MED ORDER — APIXABAN 5 MG PO TABS
5.0000 mg | ORAL_TABLET | Freq: Two times a day (BID) | ORAL | Status: DC
  Administered 2025-01-01: 5 mg via ORAL
  Filled 2025-01-01: qty 1

## 2025-01-01 MED ORDER — NICOTINE 21 MG/24HR TD PT24
21.0000 mg | MEDICATED_PATCH | Freq: Every day | TRANSDERMAL | 1 refills | Status: AC
  Filled 2025-01-01: qty 14, 14d supply, fill #0

## 2025-01-01 MED ORDER — ASPIRIN 81 MG PO TBEC: 81.0000 mg | DELAYED_RELEASE_TABLET | Freq: Every day | ORAL | Status: DC

## 2025-01-01 MED ORDER — ASPIRIN 81 MG PO TBEC
81.0000 mg | DELAYED_RELEASE_TABLET | Freq: Every day | ORAL | 12 refills | Status: DC
  Filled 2025-01-01: qty 30, 30d supply, fill #0

## 2025-01-01 MED ORDER — ATORVASTATIN CALCIUM 40 MG PO TABS
40.0000 mg | ORAL_TABLET | Freq: Every day | ORAL | 1 refills | Status: AC
  Filled 2025-01-01: qty 90, 90d supply, fill #0

## 2025-01-01 NOTE — Discharge Summary (Signed)
 " Physician Discharge Summary   Patient: Lindsay [name] MRN: [id number] DOB: 1941/09/24  Admit date:     12/30/2024  Discharge date: 01/01/25  Discharge Physician: Amaryllis Dare   PCP: Alla Amis, MD   Recommendations at discharge:  Please obtain CBC and BMP on follow-up Follow-up with vascular surgery Follow-up with primary care provider  Discharge Diagnoses: Principal Problem:   PAD (peripheral artery disease) Active Problems:   Deep vein thrombosis (DVT) of left lower extremity (HCC)   Hypokalemia   Essential hypertension   Dyslipidemia   GERD (gastroesophageal reflux disease)   Hypothyroidism   Left leg pain   Occlusion of stent of peripheral artery   Hospital Course: Partly taken from H&P.  Lindsay [name] is a 84 y.o. female with medical history significant of PAD s/p L SFA stent, DVT on Eliquis , hypertension, hyperlipidemia, current smoker, Presents to the emergency department for evaluation of intermittent, nonradiating left calf pain she noted upon awakening this morning.  She also notes the leg is cool.  She missed her DOAC on Monday.  On presentation hemodynamically stable.  No evidence of critical limb ischemia on exam, bilateral lower extremity Doppler with concern of SFA stent occlusion.  Vascular surgery was consulted and patient was started on heparin  infusion with a plan for angiography tomorrow.  1/29: Vital stable, mild hypokalemia with potassium at 3.1 , magnesium  1.7, which is being repleted. Patient is going for angiography and intervention for SFA stent occlusion.  Statin dose was increased.  1/30: Hemodynamically stable, s/p mechanical thrombectomy, angiography with angioplasty and new stents placement by vascular surgery on 12/31/2024.  Tolerated the procedure well.  Pain resolved.  Vascular surgery is advising to start Plavix  along with Eliquis , aspirin  allergy noted.  Her home atorvastatin  dose was increased to 40 mg daily.  Patient was  advised to regularly take her home medications and need to have a close follow-up with her providers including vascular surgery.  Assessment and Plan: * PAD (peripheral artery disease) Left SFA stent occlusion. Concern of being noncompliant with Eliquis  as she was missing some doses.  Vascular surgery was consulted s/p angiography, mechanical thrombectomy, angioplasty and new stents placement by vascular surgery 12/31/2024.  She was started on Plavix  along with Eliquis .  Atorvastatin  dose was increased.  Will need close follow-up with vascular surgery for further assistance  Deep vein thrombosis (DVT) of left lower extremity (HCC) Patient with history of DVT. - Continue home Eliquis   Hypokalemia With borderline hypomagnesemia, both electrolytes were repleted before discharge  Essential hypertension - Continue home amlodipine  and losartan   Dyslipidemia Home atorvastatin  dose was increased to 40 mg daily  GERD (gastroesophageal reflux disease) - Continue with PPI  Hypothyroidism - Continue with home Synthroid   Consultants: Vascular surgery Procedures performed: Angiography, mechanical thrombectomy, angioplasty and PCI Disposition: Home Diet recommendation:  Cardiac diet DISCHARGE MEDICATION: Allergies as of 01/01/2025       Reactions   Aspirin  Other (See Comments)   GI BLEED   Nsaids Other (See Comments)   GI BLEED   Penicillins Itching, Rash, Other (See Comments)   IgE = 134 (WNL) on 09/08/2024 Has patient had a PCN reaction causing immediate rash, facial/tongue/throat swelling, SOB or lightheadedness with hypotension: Yes Has patient had a PCN reaction causing severe rash involving mucus membranes or skin necrosis: No Has patient had a PCN reaction that required hospitalization No Has patient had a PCN reaction occurring within the last 10 years: No If all of the above answers are NO,  then may proceed with Cephalosporin use.   Pollen Extract Other (See Comments)    Congestion, Eye Irritation        Medication List     TAKE these medications    acetaminophen  500 MG tablet Commonly known as: TYLENOL  Take 1,000 mg by mouth daily.   amLODipine  10 MG tablet Commonly known as: NORVASC  Take 10 mg by mouth daily.   atorvastatin  40 MG tablet Commonly known as: LIPITOR Take 1 tablet (40 mg total) by mouth daily. What changed:  medication strength how much to take   bisoprolol -hydrochlorothiazide  10-6.25 MG tablet Commonly known as: ZIAC  TAKE 1 TABLET BY MOUTH DAILY   chlorhexidine  4 % external liquid Commonly known as: HIBICLENS  Apply 15 mLs (1 Application total) topically as directed for 30 doses. Use as directed daily for 5 days every other week for 6 weeks.   clopidogrel  75 MG tablet Commonly known as: PLAVIX  Take 1 tablet (75 mg total) by mouth daily.   Combigan  0.2-0.5 % ophthalmic solution Generic drug: brimonidine -timolol  Place 1 drop into both eyes every 12 (twelve) hours.   Eliquis  5 MG Tabs tablet Generic drug: apixaban  TAKE 1 TABLET BY MOUTH TWICE A DAY   levothyroxine  25 MCG tablet Commonly known as: SYNTHROID  Take 25 mcg by mouth daily before breakfast.   losartan  100 MG tablet Commonly known as: COZAAR  TAKE ONE (1) TABLET BY MOUTH EVERY DAY   MULTI COMPLETE PO Take 1 tablet by mouth daily.   nicotine  21 mg/24hr patch Commonly known as: NICODERM CQ  - dosed in mg/24 hours Place 1 patch (21 mg total) onto the skin daily.   oxyCODONE  5 MG immediate release tablet Commonly known as: Oxy IR/ROXICODONE  Take 1 tablet (5 mg total) by mouth every 4 (four) hours as needed for moderate pain (pain score 4-6) (pain score 4-6).   pantoprazole  40 MG tablet Commonly known as: PROTONIX  TAKE ONE (1) TABLET EACH DAY   potassium chloride  SA 20 MEQ tablet Commonly known as: KLOR-CON  M Take 20 mEq by mouth daily.   traMADol  50 MG tablet Commonly known as: ULTRAM  Take 1-2 tablets (50-100 mg total) by mouth every 4 (four)  hours as needed for moderate pain (pain score 4-6).        Follow-up Information     Alla Amis, MD. Schedule an appointment as soon as possible for a visit in 1 week(s).   Specialty: Family Medicine Contact information: [address] [facility] [place] [address] [phone]         Marea Selinda RAMAN, MD. Go in 2 day(s).   Specialties: Vascular Surgery, Radiology, Interventional Cardiology Why: See Lindsay [name] in 3-4 weeks with ABI Contact information: [address] [place] [address] [phone]                Discharge Exam: Fredricka Weights   12/30/24 1406 12/30/24 2319  Weight: 59.9 kg 60.4 kg   General.  Frail elderly lady, in no acute distress. Pulmonary.  Lungs clear bilaterally, normal respiratory effort. CV.  Regular rate and rhythm, no JVD, rub or murmur. Abdomen.  Soft, nontender, nondistended, BS positive. CNS.  Alert and oriented .  No focal neurologic deficit. Extremities.  No edema, pulses intact and symmetrical. Psychiatry.  Judgment and insight appears normal.   Condition at discharge: stable  The results of significant diagnostics from this hospitalization (including imaging, microbiology, ancillary and laboratory) are listed below for reference.   Imaging Studies: PERIPHERAL VASCULAR CATHETERIZATION Result Date:  12/31/2024 See surgical note for result.  US  Venous Img Lower  Left (DVT Study) Result Date: 12/30/2024 EXAM: ULTRASOUND DUPLEX OF THE LEFT LOWER EXTREMITY VEINS TECHNIQUE: Duplex ultrasound using B-mode/gray scaled imaging and Doppler spectral analysis and color flow was obtained of the deep venous structures of the left lower extremity. COMPARISON: US  Left Lower Extremity 12/20/2019. CLINICAL HISTORY: Pain. FINDINGS: The common femoral vein, femoral vein, popliteal vein, and posterior tibial vein demonstrate normal compressibility with normal color flow and spectral analysis. ARTERIAL FINDINGS:  Incidental note is made of occlusion of the left superficial femoral arterial stent. Reconstitution of the distal popliteal artery is noted. IMPRESSION: 1. No evidence of DVT. 2. Occlusion of the left superficial femoral arterial stent with reconstitution of the distal popliteal artery. Electronically signed by: Oneil Devonshire MD 12/30/2024 06:16 PM EST RP Workstation: [id number]    Microbiology: Results for orders placed or performed during the hospital encounter of 09/08/24  Surgical pcr screen     Status: Abnormal   Collection Time: 09/08/24 11:01 AM   Specimen: Nasal Mucosa; Nasal Swab  Result Value Ref Range Status   MRSA, PCR POSITIVE (A) NEGATIVE Final    Comment: RESULT CALLED TO, READ BACK BY AND VERIFIED WITH: BRYAN GRAY [id number] 09/08/24 HNM    Staphylococcus aureus POSITIVE (A) NEGATIVE Final    Comment: (NOTE) The Xpert SA Assay (FDA approved for NASAL specimens in patients 54 years of age and older), is one component of a comprehensive surveillance program. It is not intended to diagnose infection nor to guide or monitor treatment. Performed at [facility], [address] [place], [place] [address]     Labs: CBC: Recent Labs  Lab 12/30/24 1409 12/31/24 0545 01/01/25 0403  WBC 6.9 6.8 8.4  HGB 12.9 11.5* 11.4*  HCT 35.6* 31.9* 31.5*  MCV 80.2 80.2 80.8  PLT 237 212 203   Basic Metabolic Panel: Recent Labs  Lab 12/30/24 1409 12/31/24 0545 12/31/24 1132 01/01/25 0403  NA 144 143  --  141  K 3.8 3.1*  --  3.5  CL 105 105  --  105  CO2 26 25  --  27  GLUCOSE 104* 112*  --  119*  BUN 11 9  --  8  CREATININE 0.77 0.52  --  0.53  CALCIUM  9.1 8.6*  --  8.7*  MG  --   --  1.7  --    Liver Function Tests: No results for input(s): AST, ALT, ALKPHOS, BILITOT, PROT, ALBUMIN in the last 168 hours. CBG: No results for input(s): GLUCAP in the last 168 hours.  Discharge time spent: greater than 30 minutes.  This record has been created using  Conservation officer, historic buildings. Errors have been sought and corrected,but may not always be located. Such creation errors do not reflect on the standard of care.   Signed: Amaryllis Dare, MD [facility] 01/01/2025 "

## 2025-01-01 NOTE — Plan of Care (Signed)
 Pt alert oriented x 4. RA. Ambulated with +1 assist. Pulses palpable bilateral lower extremity. Pt c/o pain at bending left knee, prn tramadol  given per order, medication effective.    Problem: Education: Goal: Knowledge of General Education information will improve Description: Including pain rating scale, medication(s)/side effects and non-pharmacologic comfort measures Outcome: Progressing   Problem: Health Behavior/Discharge Planning: Goal: Ability to manage health-related needs will improve Outcome: Progressing   Problem: Clinical Measurements: Goal: Ability to maintain clinical measurements within normal limits will improve Outcome: Progressing Goal: Will remain free from infection Outcome: Progressing Goal: Diagnostic test results will improve Outcome: Progressing Goal: Respiratory complications will improve Outcome: Progressing Goal: Cardiovascular complication will be avoided Outcome: Progressing   Problem: Activity: Goal: Risk for activity intolerance will decrease Outcome: Progressing   Problem: Nutrition: Goal: Adequate nutrition will be maintained Outcome: Progressing   Problem: Coping: Goal: Level of anxiety will decrease Outcome: Progressing   Problem: Elimination: Goal: Will not experience complications related to bowel motility Outcome: Progressing Goal: Will not experience complications related to urinary retention Outcome: Progressing   Problem: Pain Managment: Goal: General experience of comfort will improve and/or be controlled Outcome: Progressing   Problem: Safety: Goal: Ability to remain free from injury will improve Outcome: Progressing   Problem: Skin Integrity: Goal: Risk for impaired skin integrity will decrease Outcome: Progressing

## 2025-01-01 NOTE — Plan of Care (Signed)

## 2025-01-01 NOTE — Progress Notes (Signed)
 Reviewed discharge instructions with pt. PT verbalize understanding. IV removed. Tele removed. Pt has belongings

## 2025-01-01 NOTE — TOC CM/SW Note (Signed)
 Transition of Care [facility]) - Inpatient Brief Assessment   Patient Details  Name: Lindsay [name] MRN: [id number] Date of Birth: [birth year]  Transition of [facility]) CM/SW Contact:    Lauraine JAYSON Carpen, LCSW Phone Number: 01/01/2025, 10:48 AM   Clinical Narrative: CSW reviewed chart. No TOC needs identified so far. CSW will continue to follow progress. Please place [facility] consult if any needs arise.  Transition of Care Asessment: Insurance and Status: Insurance coverage has been reviewed Patient has primary care physician: Yes Home environment has been reviewed: Single family home Prior level of function:: Not documented Prior/Current Home Services: No current home services Social Drivers of Health Review: SDOH reviewed no interventions necessary Readmission risk has been reviewed: Yes Transition of care needs: no transition of care needs at this time

## 2025-01-03 ENCOUNTER — Other Ambulatory Visit: Payer: Self-pay

## 2025-01-06 ENCOUNTER — Telehealth (INDEPENDENT_AMBULATORY_CARE_PROVIDER_SITE_OTHER): Payer: Self-pay

## 2025-01-06 NOTE — Telephone Encounter (Signed)
 Patient left a voicemail stating that has a question about a medication she was prescribed. I left a message on the patient voicemail to return call to the office.

## 2025-01-06 NOTE — Telephone Encounter (Signed)
 Patient returned phone call, requesting call back, call to patient she requests clarification on her discharge instructions for her medications as she has not been able to get in contact with her primary care. Patient advised she is already on Lipitor and has had atorvastatin  added to her regimen per instructions advised her she is to start the Atorvastatin  as well as clopidogrel . Advised her the Lipitor is not on her discharge list of medication so not to take it until she follows up with primary care. Patient in agreement and will call her primary for appointment.

## 2025-01-22 ENCOUNTER — Encounter (INDEPENDENT_AMBULATORY_CARE_PROVIDER_SITE_OTHER)

## 2025-01-22 ENCOUNTER — Ambulatory Visit (INDEPENDENT_AMBULATORY_CARE_PROVIDER_SITE_OTHER): Admitting: Nurse Practitioner

## 2025-03-23 ENCOUNTER — Ambulatory Visit (INDEPENDENT_AMBULATORY_CARE_PROVIDER_SITE_OTHER): Admitting: Vascular Surgery

## 2025-03-23 ENCOUNTER — Encounter (INDEPENDENT_AMBULATORY_CARE_PROVIDER_SITE_OTHER)
